# Patient Record
Sex: Female | Born: 1950 | Race: White | Hispanic: No | Smoking: Former smoker
Health system: Southern US, Community
[De-identification: ages and names within clinical notes are randomized; demographics above are authoritative.]

## PROBLEM LIST (undated history)

## (undated) DIAGNOSIS — G4710 Hypersomnia, unspecified: Principal | ICD-10-CM

## (undated) DIAGNOSIS — E119 Type 2 diabetes mellitus without complications: Secondary | ICD-10-CM

## (undated) DIAGNOSIS — K219 Gastro-esophageal reflux disease without esophagitis: Secondary | ICD-10-CM

## (undated) DIAGNOSIS — F419 Anxiety disorder, unspecified: Secondary | ICD-10-CM

## (undated) DIAGNOSIS — G2581 Restless legs syndrome: Secondary | ICD-10-CM

## (undated) DIAGNOSIS — E039 Hypothyroidism, unspecified: Secondary | ICD-10-CM

## (undated) DIAGNOSIS — I5032 Chronic diastolic (congestive) heart failure: Secondary | ICD-10-CM

## (undated) DIAGNOSIS — A0472 Enterocolitis due to Clostridium difficile, not specified as recurrent: Secondary | ICD-10-CM

## (undated) DIAGNOSIS — S62329A Displaced fracture of shaft of unspecified metacarpal bone, initial encounter for closed fracture: Secondary | ICD-10-CM

## (undated) DIAGNOSIS — I2510 Atherosclerotic heart disease of native coronary artery without angina pectoris: Secondary | ICD-10-CM

## (undated) DIAGNOSIS — R079 Chest pain, unspecified: Secondary | ICD-10-CM

## (undated) DIAGNOSIS — I10 Essential (primary) hypertension: Secondary | ICD-10-CM

## (undated) DIAGNOSIS — J111 Influenza due to unidentified influenza virus with other respiratory manifestations: Secondary | ICD-10-CM

## (undated) DIAGNOSIS — G4733 Obstructive sleep apnea (adult) (pediatric): Secondary | ICD-10-CM

## (undated) DIAGNOSIS — M1712 Unilateral primary osteoarthritis, left knee: Secondary | ICD-10-CM

## (undated) DIAGNOSIS — T8859XA Other complications of anesthesia, initial encounter: Secondary | ICD-10-CM

## (undated) DIAGNOSIS — K760 Fatty (change of) liver, not elsewhere classified: Secondary | ICD-10-CM

## (undated) DIAGNOSIS — E538 Deficiency of other specified B group vitamins: Secondary | ICD-10-CM

## (undated) DIAGNOSIS — D179 Benign lipomatous neoplasm, unspecified: Secondary | ICD-10-CM

## (undated) DIAGNOSIS — L659 Nonscarring hair loss, unspecified: Secondary | ICD-10-CM

## (undated) DIAGNOSIS — E785 Hyperlipidemia, unspecified: Secondary | ICD-10-CM

## (undated) DIAGNOSIS — E1151 Type 2 diabetes mellitus with diabetic peripheral angiopathy without gangrene: Secondary | ICD-10-CM

## (undated) DIAGNOSIS — S82409A Unspecified fracture of shaft of unspecified fibula, initial encounter for closed fracture: Secondary | ICD-10-CM

## (undated) DIAGNOSIS — K529 Noninfective gastroenteritis and colitis, unspecified: Secondary | ICD-10-CM

## (undated) DIAGNOSIS — M549 Dorsalgia, unspecified: Secondary | ICD-10-CM

## (undated) DIAGNOSIS — F32A Depression, unspecified: Secondary | ICD-10-CM

## (undated) DIAGNOSIS — L409 Psoriasis, unspecified: Secondary | ICD-10-CM

## (undated) DIAGNOSIS — K52839 Microscopic colitis, unspecified: Secondary | ICD-10-CM

## (undated) DIAGNOSIS — M1990 Unspecified osteoarthritis, unspecified site: Secondary | ICD-10-CM

## (undated) DIAGNOSIS — R0602 Shortness of breath: Secondary | ICD-10-CM

## (undated) DIAGNOSIS — K589 Irritable bowel syndrome without diarrhea: Secondary | ICD-10-CM

## (undated) DIAGNOSIS — H469 Unspecified optic neuritis: Secondary | ICD-10-CM

## (undated) DIAGNOSIS — M7989 Other specified soft tissue disorders: Secondary | ICD-10-CM

## (undated) DIAGNOSIS — K621 Rectal polyp: Secondary | ICD-10-CM

## (undated) DIAGNOSIS — M2550 Pain in unspecified joint: Secondary | ICD-10-CM

## (undated) DIAGNOSIS — F329 Major depressive disorder, single episode, unspecified: Secondary | ICD-10-CM

## (undated) DIAGNOSIS — T4145XA Adverse effect of unspecified anesthetic, initial encounter: Secondary | ICD-10-CM

## (undated) DIAGNOSIS — G4730 Sleep apnea, unspecified: Principal | ICD-10-CM

## (undated) HISTORY — DX: Type 2 diabetes mellitus with diabetic peripheral angiopathy without gangrene: E11.51

## (undated) HISTORY — DX: Sleep apnea, unspecified: G47.30

## (undated) HISTORY — DX: Depression, unspecified: F32.A

## (undated) HISTORY — DX: Atherosclerotic heart disease of native coronary artery without angina pectoris: I25.10

## (undated) HISTORY — DX: Major depressive disorder, single episode, unspecified: F32.9

## (undated) HISTORY — DX: Nonscarring hair loss, unspecified: L65.9

## (undated) HISTORY — DX: Chronic diastolic (congestive) heart failure: I50.32

## (undated) HISTORY — DX: Unspecified optic neuritis: H46.9

## (undated) HISTORY — DX: Hypothyroidism, unspecified: E03.9

## (undated) HISTORY — DX: Microscopic colitis, unspecified: K52.839

## (undated) HISTORY — DX: Other specified soft tissue disorders: M79.89

## (undated) HISTORY — DX: Obstructive sleep apnea (adult) (pediatric): G47.33

## (undated) HISTORY — DX: Hyperlipidemia, unspecified: E78.5

## (undated) HISTORY — DX: Psoriasis, unspecified: L40.9

## (undated) HISTORY — DX: Pain in unspecified joint: M25.50

## (undated) HISTORY — DX: Benign lipomatous neoplasm, unspecified: D17.9

## (undated) HISTORY — DX: Rectal polyp: K62.1

## (undated) HISTORY — DX: Hypersomnia, unspecified: G47.10

## (undated) HISTORY — DX: Gastro-esophageal reflux disease without esophagitis: K21.9

## (undated) HISTORY — DX: Fatty (change of) liver, not elsewhere classified: K76.0

## (undated) HISTORY — DX: Type 2 diabetes mellitus without complications: E11.9

## (undated) HISTORY — DX: Enterocolitis due to Clostridium difficile, not specified as recurrent: A04.72

## (undated) HISTORY — DX: Displaced fracture of shaft of unspecified metacarpal bone, initial encounter for closed fracture: S62.329A

## (undated) HISTORY — DX: Restless legs syndrome: G25.81

## (undated) HISTORY — DX: Irritable bowel syndrome, unspecified: K58.9

## (undated) HISTORY — DX: Deficiency of other specified B group vitamins: E53.8

## (undated) HISTORY — DX: Shortness of breath: R06.02

## (undated) HISTORY — DX: Unilateral primary osteoarthritis, left knee: M17.12

## (undated) HISTORY — DX: Morbid (severe) obesity due to excess calories: E66.01

## (undated) HISTORY — DX: Chest pain, unspecified: R07.9

## (undated) HISTORY — DX: Noninfective gastroenteritis and colitis, unspecified: K52.9

## (undated) HISTORY — DX: Unspecified osteoarthritis, unspecified site: M19.90

## (undated) HISTORY — DX: Dorsalgia, unspecified: M54.9

## (undated) HISTORY — DX: Essential (primary) hypertension: I10

## (undated) HISTORY — DX: Influenza due to unidentified influenza virus with other respiratory manifestations: J11.1

## (undated) HISTORY — DX: Anxiety disorder, unspecified: F41.9

## (undated) HISTORY — DX: Unspecified fracture of shaft of unspecified fibula, initial encounter for closed fracture: S82.409A

---

## 1997-12-07 ENCOUNTER — Encounter: Payer: Self-pay | Admitting: Family Medicine

## 1997-12-07 LAB — CONVERTED CEMR LAB: Hgb A1c MFr Bld: 6.9 %

## 1998-05-15 ENCOUNTER — Encounter: Payer: Self-pay | Admitting: Family Medicine

## 1998-12-14 ENCOUNTER — Encounter: Payer: Self-pay | Admitting: Family Medicine

## 1998-12-14 LAB — CONVERTED CEMR LAB: Hgb A1c MFr Bld: 7.4 %

## 1999-01-14 ENCOUNTER — Other Ambulatory Visit: Admission: RE | Admit: 1999-01-14 | Discharge: 1999-01-14 | Payer: Self-pay | Admitting: Family Medicine

## 1999-05-12 ENCOUNTER — Encounter: Admission: RE | Admit: 1999-05-12 | Discharge: 1999-08-10 | Payer: Self-pay | Admitting: Family Medicine

## 1999-05-16 HISTORY — PX: OSTEOTOMY AND ULNAR SHORTENING: SHX2140

## 1999-05-24 ENCOUNTER — Encounter: Payer: Self-pay | Admitting: Family Medicine

## 1999-05-24 ENCOUNTER — Encounter: Admission: RE | Admit: 1999-05-24 | Discharge: 1999-05-24 | Payer: Self-pay | Admitting: Family Medicine

## 1999-08-14 ENCOUNTER — Encounter: Payer: Self-pay | Admitting: Family Medicine

## 1999-08-14 LAB — CONVERTED CEMR LAB: Microalbumin U total vol: 11.5 mg/L

## 1999-10-15 ENCOUNTER — Encounter: Payer: Self-pay | Admitting: *Deleted

## 1999-10-15 ENCOUNTER — Emergency Department (HOSPITAL_COMMUNITY): Admission: EM | Admit: 1999-10-15 | Discharge: 1999-10-15 | Payer: Self-pay | Admitting: *Deleted

## 2000-02-13 ENCOUNTER — Encounter: Payer: Self-pay | Admitting: Family Medicine

## 2000-02-13 LAB — CONVERTED CEMR LAB: Hgb A1c MFr Bld: 5.7 %

## 2000-08-13 ENCOUNTER — Encounter: Payer: Self-pay | Admitting: Family Medicine

## 2000-08-13 LAB — CONVERTED CEMR LAB: Hgb A1c MFr Bld: 5.8 %

## 2000-09-18 ENCOUNTER — Encounter: Admission: RE | Admit: 2000-09-18 | Discharge: 2000-09-18 | Payer: Self-pay | Admitting: Family Medicine

## 2000-09-18 ENCOUNTER — Encounter: Payer: Self-pay | Admitting: Family Medicine

## 2001-05-15 ENCOUNTER — Encounter: Payer: Self-pay | Admitting: Family Medicine

## 2001-05-15 LAB — CONVERTED CEMR LAB: Hgb A1c MFr Bld: 5.9 %

## 2002-01-13 ENCOUNTER — Encounter: Payer: Self-pay | Admitting: Family Medicine

## 2002-01-23 ENCOUNTER — Other Ambulatory Visit: Admission: RE | Admit: 2002-01-23 | Discharge: 2002-01-23 | Payer: Self-pay | Admitting: Family Medicine

## 2002-02-24 ENCOUNTER — Encounter: Payer: Self-pay | Admitting: Family Medicine

## 2002-02-24 ENCOUNTER — Encounter: Admission: RE | Admit: 2002-02-24 | Discharge: 2002-02-24 | Payer: Self-pay | Admitting: Family Medicine

## 2002-06-15 ENCOUNTER — Encounter: Payer: Self-pay | Admitting: Family Medicine

## 2002-06-15 LAB — CONVERTED CEMR LAB: Hgb A1c MFr Bld: 6.1 %

## 2003-05-16 ENCOUNTER — Encounter: Payer: Self-pay | Admitting: Family Medicine

## 2003-06-23 ENCOUNTER — Ambulatory Visit (HOSPITAL_COMMUNITY): Admission: RE | Admit: 2003-06-23 | Discharge: 2003-06-23 | Payer: Self-pay | Admitting: Family Medicine

## 2003-08-14 ENCOUNTER — Encounter: Payer: Self-pay | Admitting: Family Medicine

## 2003-12-04 ENCOUNTER — Other Ambulatory Visit: Admission: RE | Admit: 2003-12-04 | Discharge: 2003-12-04 | Payer: Self-pay | Admitting: Family Medicine

## 2004-05-10 ENCOUNTER — Ambulatory Visit: Payer: Self-pay | Admitting: Family Medicine

## 2004-05-15 DIAGNOSIS — K760 Fatty (change of) liver, not elsewhere classified: Secondary | ICD-10-CM | POA: Insufficient documentation

## 2004-05-15 HISTORY — DX: Fatty (change of) liver, not elsewhere classified: K76.0

## 2004-05-15 HISTORY — PX: ESOPHAGOGASTRODUODENOSCOPY: SHX1529

## 2004-07-05 ENCOUNTER — Ambulatory Visit: Payer: Self-pay | Admitting: Family Medicine

## 2004-08-10 ENCOUNTER — Ambulatory Visit: Payer: Self-pay | Admitting: Internal Medicine

## 2004-08-11 ENCOUNTER — Ambulatory Visit: Payer: Self-pay | Admitting: Internal Medicine

## 2004-11-01 ENCOUNTER — Ambulatory Visit: Payer: Self-pay | Admitting: Family Medicine

## 2004-11-22 ENCOUNTER — Other Ambulatory Visit: Admission: RE | Admit: 2004-11-22 | Discharge: 2004-11-22 | Payer: Self-pay | Admitting: Obstetrics and Gynecology

## 2004-12-26 ENCOUNTER — Ambulatory Visit: Payer: Self-pay | Admitting: Family Medicine

## 2004-12-29 ENCOUNTER — Ambulatory Visit: Payer: Self-pay | Admitting: Family Medicine

## 2005-01-20 ENCOUNTER — Ambulatory Visit: Payer: Self-pay | Admitting: Cardiology

## 2005-02-09 ENCOUNTER — Ambulatory Visit: Payer: Self-pay

## 2005-05-10 ENCOUNTER — Ambulatory Visit: Payer: Self-pay | Admitting: Family Medicine

## 2005-09-15 ENCOUNTER — Ambulatory Visit: Payer: Self-pay | Admitting: Family Medicine

## 2005-11-21 ENCOUNTER — Ambulatory Visit: Payer: Self-pay | Admitting: Family Medicine

## 2006-02-02 ENCOUNTER — Ambulatory Visit: Payer: Self-pay | Admitting: Family Medicine

## 2006-02-02 LAB — CONVERTED CEMR LAB
Hgb A1c MFr Bld: 6.3 %
Microalbumin U total vol: 1.9 mg/L

## 2006-05-16 ENCOUNTER — Encounter: Payer: Self-pay | Admitting: Family Medicine

## 2006-05-16 LAB — CONVERTED CEMR LAB

## 2006-05-17 ENCOUNTER — Ambulatory Visit: Payer: Self-pay | Admitting: Family Medicine

## 2006-11-12 ENCOUNTER — Encounter: Payer: Self-pay | Admitting: Family Medicine

## 2006-11-12 DIAGNOSIS — E039 Hypothyroidism, unspecified: Secondary | ICD-10-CM | POA: Insufficient documentation

## 2006-11-12 DIAGNOSIS — E538 Deficiency of other specified B group vitamins: Secondary | ICD-10-CM | POA: Insufficient documentation

## 2006-11-12 DIAGNOSIS — R609 Edema, unspecified: Secondary | ICD-10-CM | POA: Insufficient documentation

## 2006-11-12 DIAGNOSIS — F4321 Adjustment disorder with depressed mood: Secondary | ICD-10-CM | POA: Insufficient documentation

## 2006-11-14 ENCOUNTER — Encounter (INDEPENDENT_AMBULATORY_CARE_PROVIDER_SITE_OTHER): Payer: Self-pay | Admitting: *Deleted

## 2006-11-19 ENCOUNTER — Encounter (INDEPENDENT_AMBULATORY_CARE_PROVIDER_SITE_OTHER): Payer: Self-pay | Admitting: *Deleted

## 2006-11-22 ENCOUNTER — Encounter: Admission: RE | Admit: 2006-11-22 | Discharge: 2006-11-22 | Payer: Self-pay | Admitting: Obstetrics and Gynecology

## 2006-12-11 ENCOUNTER — Ambulatory Visit: Payer: Self-pay | Admitting: Family Medicine

## 2006-12-11 LAB — CONVERTED CEMR LAB: Hgb A1c MFr Bld: 6.3 % — ABNORMAL HIGH (ref 4.6–6.0)

## 2006-12-13 ENCOUNTER — Ambulatory Visit: Payer: Self-pay | Admitting: Family Medicine

## 2007-01-17 ENCOUNTER — Encounter: Payer: Self-pay | Admitting: Family Medicine

## 2007-01-25 ENCOUNTER — Ambulatory Visit (HOSPITAL_COMMUNITY): Admission: RE | Admit: 2007-01-25 | Discharge: 2007-01-25 | Payer: Self-pay | Admitting: Surgery

## 2007-02-11 ENCOUNTER — Encounter: Admission: RE | Admit: 2007-02-11 | Discharge: 2007-02-11 | Payer: Self-pay | Admitting: Surgery

## 2007-03-14 ENCOUNTER — Telehealth: Payer: Self-pay | Admitting: Family Medicine

## 2007-03-19 ENCOUNTER — Encounter (INDEPENDENT_AMBULATORY_CARE_PROVIDER_SITE_OTHER): Payer: Self-pay | Admitting: *Deleted

## 2007-03-22 ENCOUNTER — Encounter (INDEPENDENT_AMBULATORY_CARE_PROVIDER_SITE_OTHER): Payer: Self-pay | Admitting: *Deleted

## 2007-07-13 ENCOUNTER — Encounter: Payer: Self-pay | Admitting: Family Medicine

## 2007-07-13 ENCOUNTER — Emergency Department (HOSPITAL_COMMUNITY): Admission: EM | Admit: 2007-07-13 | Discharge: 2007-07-13 | Payer: Self-pay | Admitting: Emergency Medicine

## 2007-07-17 ENCOUNTER — Encounter: Payer: Self-pay | Admitting: Family Medicine

## 2007-07-18 ENCOUNTER — Ambulatory Visit: Payer: Self-pay | Admitting: Family Medicine

## 2007-07-18 DIAGNOSIS — E876 Hypokalemia: Secondary | ICD-10-CM | POA: Insufficient documentation

## 2007-07-19 LAB — CONVERTED CEMR LAB
CO2: 29 meq/L (ref 19–32)
Calcium: 9 mg/dL (ref 8.4–10.5)
Chloride: 97 meq/L (ref 96–112)
GFR calc non Af Amer: 50 mL/min
Glucose, Bld: 100 mg/dL — ABNORMAL HIGH (ref 70–99)
Sodium: 134 meq/L — ABNORMAL LOW (ref 135–145)

## 2007-08-08 ENCOUNTER — Ambulatory Visit: Payer: Self-pay | Admitting: Cardiology

## 2007-08-20 ENCOUNTER — Encounter: Payer: Self-pay | Admitting: Family Medicine

## 2007-08-20 ENCOUNTER — Ambulatory Visit: Payer: Self-pay

## 2007-08-21 ENCOUNTER — Ambulatory Visit: Payer: Self-pay

## 2007-10-15 ENCOUNTER — Telehealth: Payer: Self-pay | Admitting: Family Medicine

## 2007-12-18 ENCOUNTER — Telehealth: Payer: Self-pay | Admitting: Family Medicine

## 2007-12-31 ENCOUNTER — Ambulatory Visit: Payer: Self-pay | Admitting: Family Medicine

## 2007-12-31 DIAGNOSIS — S335XXA Sprain of ligaments of lumbar spine, initial encounter: Secondary | ICD-10-CM | POA: Insufficient documentation

## 2008-02-10 ENCOUNTER — Ambulatory Visit: Payer: Self-pay | Admitting: Family Medicine

## 2008-02-10 LAB — CONVERTED CEMR LAB
BUN: 15 mg/dL (ref 6–23)
CO2: 29 meq/L (ref 19–32)
Calcium: 8.4 mg/dL (ref 8.4–10.5)
Chloride: 107 meq/L (ref 96–112)
Creatinine, Ser: 0.8 mg/dL (ref 0.4–1.2)
Hgb A1c MFr Bld: 6.1 % — ABNORMAL HIGH (ref 4.6–6.0)

## 2008-03-04 ENCOUNTER — Ambulatory Visit: Payer: Self-pay | Admitting: Family Medicine

## 2008-05-11 ENCOUNTER — Ambulatory Visit: Payer: Self-pay | Admitting: Family Medicine

## 2008-05-27 LAB — CONVERTED CEMR LAB: Pap Smear: NORMAL

## 2008-06-29 ENCOUNTER — Telehealth: Payer: Self-pay | Admitting: Family Medicine

## 2008-11-02 ENCOUNTER — Encounter: Payer: Self-pay | Admitting: Family Medicine

## 2008-11-02 ENCOUNTER — Ambulatory Visit (HOSPITAL_BASED_OUTPATIENT_CLINIC_OR_DEPARTMENT_OTHER): Admission: RE | Admit: 2008-11-02 | Discharge: 2008-11-02 | Payer: Self-pay | Admitting: Orthopedic Surgery

## 2008-11-02 HISTORY — PX: KNEE SURGERY: SHX244

## 2008-12-02 ENCOUNTER — Ambulatory Visit: Payer: Self-pay | Admitting: Family Medicine

## 2008-12-03 ENCOUNTER — Telehealth: Payer: Self-pay | Admitting: Family Medicine

## 2009-01-07 ENCOUNTER — Ambulatory Visit: Payer: Self-pay | Admitting: Family Medicine

## 2009-01-07 LAB — CONVERTED CEMR LAB
CO2: 29 meq/L (ref 19–32)
Calcium: 8.4 mg/dL (ref 8.4–10.5)
Chloride: 101 meq/L (ref 96–112)
Creatinine, Ser: 1 mg/dL (ref 0.4–1.2)
GFR calc non Af Amer: 60.81 mL/min (ref >60)
Glucose, Bld: 123 mg/dL — ABNORMAL HIGH (ref 70–99)
Hgb A1c MFr Bld: 5.6 % (ref 4.6–6.5)
Phosphorus: 3.6 mg/dL (ref 2.3–4.6)
Potassium: 3.3 meq/L — ABNORMAL LOW (ref 3.5–5.1)

## 2009-01-13 ENCOUNTER — Ambulatory Visit: Payer: Self-pay | Admitting: Family Medicine

## 2009-01-13 DIAGNOSIS — H60549 Acute eczematoid otitis externa, unspecified ear: Secondary | ICD-10-CM | POA: Insufficient documentation

## 2009-02-02 ENCOUNTER — Telehealth: Payer: Self-pay | Admitting: Family Medicine

## 2009-02-11 ENCOUNTER — Telehealth: Payer: Self-pay | Admitting: Family Medicine

## 2009-03-01 ENCOUNTER — Telehealth: Payer: Self-pay | Admitting: Family Medicine

## 2009-03-12 ENCOUNTER — Telehealth (INDEPENDENT_AMBULATORY_CARE_PROVIDER_SITE_OTHER): Payer: Self-pay | Admitting: Internal Medicine

## 2009-04-06 ENCOUNTER — Ambulatory Visit: Payer: Self-pay | Admitting: Family Medicine

## 2009-04-06 LAB — CONVERTED CEMR LAB
Basophils Absolute: 0.1 10*3/uL (ref 0.0–0.1)
Bilirubin, Direct: 0 mg/dL (ref 0.0–0.3)
Calcium: 9.2 mg/dL (ref 8.4–10.5)
Creatinine, Ser: 0.7 mg/dL (ref 0.4–1.2)
Eosinophils Absolute: 0.2 10*3/uL (ref 0.0–0.7)
HCT: 41.7 % (ref 36.0–46.0)
HDL: 51.1 mg/dL (ref >39.00)
LDL Cholesterol: 81 mg/dL (ref 0–99)
Lymphs Abs: 1.2 10*3/uL (ref 0.7–4.0)
Microalb, Ur: 0.4 mg/dL (ref 0.0–1.9)
Monocytes Relative: 8.3 % (ref 3.0–12.0)
Platelets: 253 10*3/uL (ref 150.0–400.0)
RDW: 13.9 % (ref 11.5–14.6)
TSH: 2.46 microintl units/mL (ref 0.35–5.50)
Total Bilirubin: 1.1 mg/dL (ref 0.3–1.2)
Total CHOL/HDL Ratio: 3
Total Protein: 7.5 g/dL (ref 6.0–8.3)
Triglycerides: 123 mg/dL (ref 0.0–149.0)

## 2009-04-07 LAB — CONVERTED CEMR LAB: Vit D, 25-Hydroxy: 9 ng/mL — ABNORMAL LOW (ref 30–89)

## 2009-04-13 ENCOUNTER — Ambulatory Visit: Payer: Self-pay | Admitting: Family Medicine

## 2009-04-20 ENCOUNTER — Ambulatory Visit: Payer: Self-pay | Admitting: Family Medicine

## 2009-04-20 DIAGNOSIS — L03119 Cellulitis of unspecified part of limb: Secondary | ICD-10-CM

## 2009-04-20 DIAGNOSIS — L02419 Cutaneous abscess of limb, unspecified: Secondary | ICD-10-CM | POA: Insufficient documentation

## 2009-04-30 ENCOUNTER — Telehealth: Payer: Self-pay | Admitting: Family Medicine

## 2009-05-04 ENCOUNTER — Telehealth: Payer: Self-pay | Admitting: Family Medicine

## 2009-05-21 DIAGNOSIS — E1165 Type 2 diabetes mellitus with hyperglycemia: Secondary | ICD-10-CM

## 2009-05-21 DIAGNOSIS — Z6841 Body Mass Index (BMI) 40.0 and over, adult: Secondary | ICD-10-CM

## 2009-05-21 DIAGNOSIS — IMO0002 Reserved for concepts with insufficient information to code with codable children: Secondary | ICD-10-CM | POA: Insufficient documentation

## 2009-05-21 DIAGNOSIS — E1140 Type 2 diabetes mellitus with diabetic neuropathy, unspecified: Secondary | ICD-10-CM | POA: Insufficient documentation

## 2009-05-26 LAB — HM PAP SMEAR

## 2009-06-03 LAB — HM MAMMOGRAPHY: HM Mammogram: NORMAL

## 2009-06-07 ENCOUNTER — Ambulatory Visit: Payer: Self-pay | Admitting: Family Medicine

## 2009-06-07 LAB — CONVERTED CEMR LAB
ALT: 15 units/L (ref 0–35)
AST: 16 units/L (ref 0–37)

## 2009-06-10 ENCOUNTER — Telehealth: Payer: Self-pay | Admitting: Family Medicine

## 2009-06-17 ENCOUNTER — Ambulatory Visit: Payer: Self-pay | Admitting: Cardiology

## 2009-06-17 DIAGNOSIS — E785 Hyperlipidemia, unspecified: Secondary | ICD-10-CM | POA: Insufficient documentation

## 2009-07-08 ENCOUNTER — Telehealth: Payer: Self-pay | Admitting: Family Medicine

## 2009-07-09 ENCOUNTER — Encounter (INDEPENDENT_AMBULATORY_CARE_PROVIDER_SITE_OTHER): Payer: Self-pay | Admitting: *Deleted

## 2009-07-15 ENCOUNTER — Ambulatory Visit: Payer: Self-pay | Admitting: Family Medicine

## 2009-07-15 DIAGNOSIS — E559 Vitamin D deficiency, unspecified: Secondary | ICD-10-CM | POA: Insufficient documentation

## 2009-07-15 LAB — CONVERTED CEMR LAB
AST: 20 units/L (ref 0–37)
Cholesterol: 148 mg/dL (ref 0–200)
VLDL: 25 mg/dL (ref 0.0–40.0)

## 2009-07-16 LAB — CONVERTED CEMR LAB: Vit D, 25-Hydroxy: 15 ng/mL — ABNORMAL LOW (ref 30–89)

## 2009-07-21 ENCOUNTER — Ambulatory Visit: Payer: Self-pay | Admitting: Family Medicine

## 2009-08-26 ENCOUNTER — Telehealth: Payer: Self-pay | Admitting: Family Medicine

## 2009-09-24 ENCOUNTER — Telehealth: Payer: Self-pay | Admitting: Cardiology

## 2009-10-08 ENCOUNTER — Encounter: Payer: Self-pay | Admitting: Cardiology

## 2009-10-08 ENCOUNTER — Telehealth (INDEPENDENT_AMBULATORY_CARE_PROVIDER_SITE_OTHER): Payer: Self-pay | Admitting: *Deleted

## 2009-10-08 ENCOUNTER — Ambulatory Visit: Payer: Self-pay | Admitting: Cardiology

## 2009-10-08 ENCOUNTER — Observation Stay (HOSPITAL_COMMUNITY): Admission: EM | Admit: 2009-10-08 | Discharge: 2009-10-13 | Payer: Self-pay | Admitting: Emergency Medicine

## 2009-10-12 HISTORY — PX: CARDIAC CATHETERIZATION: SHX172

## 2009-10-13 ENCOUNTER — Encounter: Payer: Self-pay | Admitting: Cardiology

## 2009-10-19 ENCOUNTER — Encounter (INDEPENDENT_AMBULATORY_CARE_PROVIDER_SITE_OTHER): Payer: Self-pay | Admitting: *Deleted

## 2009-10-19 ENCOUNTER — Ambulatory Visit: Payer: Self-pay | Admitting: Family Medicine

## 2009-10-19 DIAGNOSIS — R079 Chest pain, unspecified: Secondary | ICD-10-CM | POA: Insufficient documentation

## 2009-10-20 LAB — CONVERTED CEMR LAB: Vit D, 25-Hydroxy: 24 ng/mL — ABNORMAL LOW (ref 30–89)

## 2009-11-08 ENCOUNTER — Ambulatory Visit: Payer: Self-pay | Admitting: Pulmonary Disease

## 2009-11-08 DIAGNOSIS — Z9989 Dependence on other enabling machines and devices: Secondary | ICD-10-CM | POA: Insufficient documentation

## 2009-11-08 DIAGNOSIS — G4733 Obstructive sleep apnea (adult) (pediatric): Secondary | ICD-10-CM | POA: Insufficient documentation

## 2009-11-17 ENCOUNTER — Ambulatory Visit: Payer: Self-pay | Admitting: Cardiology

## 2009-12-15 ENCOUNTER — Telehealth: Payer: Self-pay | Admitting: Family Medicine

## 2009-12-21 ENCOUNTER — Encounter (INDEPENDENT_AMBULATORY_CARE_PROVIDER_SITE_OTHER): Payer: Self-pay | Admitting: *Deleted

## 2009-12-23 ENCOUNTER — Ambulatory Visit: Payer: Self-pay | Admitting: Family Medicine

## 2009-12-23 LAB — CONVERTED CEMR LAB: Pro B Natriuretic peptide (BNP): 69.8 pg/mL (ref 0.0–100.0)

## 2009-12-24 LAB — CONVERTED CEMR LAB
AST: 18 units/L (ref 0–37)
Albumin: 3.7 g/dL (ref 3.5–5.2)
Alkaline Phosphatase: 64 units/L (ref 39–117)
Basophils Relative: 0.6 % (ref 0.0–3.0)
Bilirubin, Direct: 0.1 mg/dL (ref 0.0–0.3)
Calcium: 9.2 mg/dL (ref 8.4–10.5)
Creatinine, Ser: 0.7 mg/dL (ref 0.4–1.2)
GFR calc non Af Amer: 91.46 mL/min (ref >60)
Hemoglobin: 14.1 g/dL (ref 12.0–15.0)
Lymphocytes Relative: 26.6 % (ref 12.0–46.0)
Monocytes Relative: 4.7 % (ref 3.0–12.0)
Neutro Abs: 4.4 10*3/uL (ref 1.4–7.7)
Neutrophils Relative %: 66 % (ref 43.0–77.0)
RBC: 4.77 M/uL (ref 3.87–5.11)
Sodium: 141 meq/L (ref 135–145)
Total Protein: 6.8 g/dL (ref 6.0–8.3)
WBC: 6.6 10*3/uL (ref 4.5–10.5)

## 2010-01-13 ENCOUNTER — Ambulatory Visit: Payer: Self-pay | Admitting: Internal Medicine

## 2010-01-13 DIAGNOSIS — R197 Diarrhea, unspecified: Secondary | ICD-10-CM | POA: Insufficient documentation

## 2010-01-13 DIAGNOSIS — R1013 Epigastric pain: Secondary | ICD-10-CM | POA: Insufficient documentation

## 2010-01-13 DIAGNOSIS — R112 Nausea with vomiting, unspecified: Secondary | ICD-10-CM | POA: Insufficient documentation

## 2010-01-26 ENCOUNTER — Emergency Department (HOSPITAL_COMMUNITY): Admission: EM | Admit: 2010-01-26 | Discharge: 2010-01-26 | Payer: Self-pay | Admitting: Emergency Medicine

## 2010-03-02 ENCOUNTER — Telehealth: Payer: Self-pay | Admitting: Family Medicine

## 2010-03-09 ENCOUNTER — Ambulatory Visit: Payer: Self-pay | Admitting: Family Medicine

## 2010-04-19 ENCOUNTER — Telehealth: Payer: Self-pay | Admitting: Family Medicine

## 2010-05-02 ENCOUNTER — Telehealth: Payer: Self-pay | Admitting: Family Medicine

## 2010-05-11 ENCOUNTER — Telehealth: Payer: Self-pay | Admitting: Family Medicine

## 2010-05-18 ENCOUNTER — Ambulatory Visit
Admission: RE | Admit: 2010-05-18 | Discharge: 2010-05-18 | Payer: Self-pay | Source: Home / Self Care | Attending: Family Medicine | Admitting: Family Medicine

## 2010-06-05 ENCOUNTER — Encounter: Payer: Self-pay | Admitting: Obstetrics and Gynecology

## 2010-06-05 ENCOUNTER — Encounter: Payer: Self-pay | Admitting: Family Medicine

## 2010-06-05 ENCOUNTER — Encounter: Payer: Self-pay | Admitting: Internal Medicine

## 2010-06-06 ENCOUNTER — Telehealth: Payer: Self-pay | Admitting: Family Medicine

## 2010-06-14 NOTE — Progress Notes (Signed)
Summary: [facility]   Imported By: Sherian Rein 01/18/2010 11:53:23  _____________________________________________________________________  External Attachment:    Type:   Image     Comment:   External Document

## 2010-06-14 NOTE — Assessment & Plan Note (Signed)
Summary: FEET AND LEGS SWELLING/acute only   Vital Signs:  Patient profile:   60 year old [place] Height:      66 inches Weight:      345.38 pounds BMI:     55.95 Temp:     98.4 degrees F oral Pulse rate:   72 / minute Pulse rhythm:   regular BP sitting:   120 / 82  (left arm) Cuff size:   large  Vitals Entered By: Linde Gillis CMA Duncan Dull) (December 23, 2009 2:45 PM) CC: swelling in feet and legs   History of Present Illness: 60 yo new to me here for increased swelling in legs and feet x 2 days.  She was recently in the hosp for CP and had Cath.  She has had no recurrent chest pain. Has gained 6 pounds since cardiology visit last month.  Hospital records reviewed.  Cath and 2 D echo showed normal LV function.  Has been trying to exercise, thinks her weight is increased because retaining fluid. No wheezing or SOB.  No increase in salty food. She is premenopausal and always has some LE edema around her period.  Takes Triamterene-HCTZ, no other diuretics. Never had to use compression hose or stronger diuresis.  Current Medications (verified): 1)  Triamterene-Hctz 75-50 Mg Tabs (Triamterene-Hctz) .... Take One By Mouth Daily 2)  Ramipril 10 Mg Caps (Ramipril) .Marland Kitchen.. 1 Cap Once Daily 3)  Lipitor 40 Mg Tabs (Atorvastatin Calcium) .Marland Kitchen.. 1 Once Daily 4)  Glucovance 1.25-250 Mg Tabs (Glyburide-Metformin) .... One in Am By Mouth 5)  Synthroid 200 Mcg Tabs (Levothyroxine Sodium) .... One Tab By Mouth Once Daily 6)  Xanax 0.25 Mg  Tabs (Alprazolam) .... Take One By Mouth Three Times A Day As Needed 7)  Vitamin B-12 Cr 1000 Mcg  Tbcr (Cyanocobalamin) .... Inection Monthly By Daughter 8)  Lexapro 20 Mg  Tabs (Escitalopram Oxalate) .Marland Kitchen.. 1 1/2  Tab By Mouth Daily 9)  Monoject Syringe 25g X 1" 3 Ml Misc (Syringe/needle (Disp)) .... As Dir 10)  Protonix 40 Mg Tbec (Pantoprazole Sodium) .... Take One By Mouth Two Times A Day  Allergies: 1)  ! Erythromycin Base (Erythromycin Base)  Past  History:  Past Medical History: Last updated: 11/24/2009 B12 Deficiency Anxiety Disorder Coronary Artery Disease Depression Diabetes Hyperlipidemia Hypothyroidism Obesity  Past Surgical History: Last updated: 10/17/2009 NSVD x1 Persantine Cardiolite- wnl 97- 02/00 Ulnar osteotomy  ([name]), negative wrist 10/01 Colonoscopy 03/Caitlyn/06 Abd. U/S wnl except fatty liver 03/06 Adenosine Myoview nml 08/20/07 L Knee Partial medial and Lat Menisectomies and Condroplasty (Dr Thurston Hole) 11/02/08 HOSP CP R/O'd Nonobstruct CAD by Cath  5/27-10/13/2009 CATH and ECHO Nonobstr CAD  EF 60% 10/12/2009 HOSP CP R/O'd Cath w/ CAD EF 60% 5/27-10/13/2009  Family History: Last updated: 2009-05-08 Father: Died  at age 39 of a heart attack Mother: Died at age 40 with coronary disease, S/P 14 angioplasties and 5-vessel              coronary artery bypass grafting,  she also had a stroke and diabetes Brother died at the age of 75 of a heart attack, he had his first heart               attack at 89 years of age.  Sister A 62  Social History: Last updated: 11/24/2009 Marital Status: divorced.  lives with daughter who is married Occupation: Nature conservation officer for [employer]  Risk Factors: Alcohol Use: 0 (2009-05-08) Caffeine Use: 4 (2009-05-08) Exercise: no (08-May-2009)  Risk Factors: Smoking Status: quit (11/Caitlyn/2010) Packs/Day: 11/2 (11/Caitlyn/2010)  Review of Systems      See HPI General:  Denies malaise. Eyes:  Denies blurring. CV:  Complains of swelling of feet and weight gain; denies chest pain or discomfort and shortness of breath with exertion. Resp:  Denies shortness of breath, sputum productive, and wheezing. GI:  Denies abdominal pain and change in bowel habits. MS:  Denies joint pain, joint redness, and joint swelling.  Physical Exam  General:  Well-developed,well-nourished,in no acute distress; alert,appropriate and cooperative throughout examination, obese. Mouth:  Oral mucosa  and oropharynx without lesions or exudates.  Teeth in good repair. Lungs:  Normal respiratory effort, chest expands symmetrically. Lungs are clear to auscultation, no crackles or wheezes. Heart:  Normal rate and regular rhythm. S1 and S2 normal without gallop, murmur, click, rub or other extra sounds. Extremities:  1+ pitting pedal edema bilaterally to tibial area. No erythema, tenderness to palpation, ulceration or oozing.  Psych:  normal affect, talkative and pleasant    Impression & Recommendations:  Problem # 1:  LEG EDEMA, BILATERAL (ICD-782.3) Assessment Deteriorated Differential is wide and this is likely multifactorial. Very reassuring that she just had a normal cath and normal echo.  No cardiac complaints, other than LE edema.  EKG unchanged. Likely combination of obesity, weight gain, menstrual irregularities. Will check labs- BMET, CBC, TSH, BNP. If all normal, try either compression hose vs. short course of increased diuresis. Pt in agreement with plan. Her updated medication list for this problem includes:    Triamterene-hctz 75-50 Mg Tabs (Triamterene-hctz) .Marland Kitchen... Take one by mouth daily  Orders: Venipuncture ([address]) TLB-BMP (Basic Metabolic Panel-BMET) (80048-METABOL) TLB-CBC Platelet - w/Differential (85025-CBCD) TLB-Hepatic/Liver Function Pnl (80076-HEPATIC) TLB-TSH (Thyroid Stimulating Hormone) (84443-TSH) TLB-BNP (B-Natriuretic Peptide) (83880-BNPR) EKG w/ Interpretation (93000)  Complete Medication List: 1)  Triamterene-hctz 75-50 Mg Tabs (Triamterene-hctz) .... Take one by mouth daily 2)  Ramipril 10 Mg Caps (Ramipril) .Marland Kitchen.. 1 cap once daily 3)  Lipitor 40 Mg Tabs (Atorvastatin calcium) .Marland Kitchen.. 1 once daily 4)  Glucovance 1.25-250 Mg Tabs (Glyburide-metformin) .... One in am by mouth 5)  Synthroid 200 Mcg Tabs (Levothyroxine sodium) .... One tab by mouth once daily 6)  Xanax 0.25 Mg Tabs (Alprazolam) .... Take one by mouth three times a day as needed 7)  Vitamin  B-12 Cr 1000 Mcg Tbcr (Cyanocobalamin) .... Inection monthly by daughter 92)  Lexapro 20 Mg Tabs (Escitalopram oxalate) .Marland Kitchen.. 1 1/2  tab by mouth daily 9)  Monoject Syringe 25g X 1" 3 Ml Misc (Syringe/needle (disp)) .... As dir 10)  Protonix 40 Mg Tbec (Pantoprazole sodium) .... Take one by mouth two times a day  Current Allergies (reviewed today): ! ERYTHROMYCIN BASE (ERYTHROMYCIN BASE)

## 2010-06-14 NOTE — Letter (Signed)
Summary: ER Notification  Architectural technologist, Main Office  [address]   [place], [place] [address]   Phone: [phone]  Fax: [phone]    Oct 08, 2009 1:07 PM  Tmya Stigall  The above referenced patient has been advised to report directly to the Emergency Room. Please see below for more information:  Dx: chest pain    Private Vehicle  ____x___________ or EMS:  ________________   Orders:  Yes ______ or No  _______   Notify upon arrival:     Trish [phone]       Or _________________   Thank you,    [facility] Staff

## 2010-06-14 NOTE — Assessment & Plan Note (Signed)
Summary: consult for osa   Copy to:  Laurita Quint, [place] Primary Provider/Referring Provider:  Shaune Leeks MD  CC:  Sleep Consult.  History of Present Illness: The pt is a 60y/o female who I have been asked to see for possible osa.  She has been noted to have loud snoring, as well as pauses in her breathing during sleep.  She has not had choking arousal, but has awakened with coughing and a feeling of something obstructing her airway.  She goes to bed btw 11-50mn, and arises at 7am to start her day.  Most am's she feels rested, but then begins to get tired as the day goes on.  She usually begins to have sleep pressure with periods of inactivity after lunch, and notes a decline in her alertness and ability to concentrate.  She can doze easily while watching tv or reading in the evenings.  She denies any issues with driving short distances, but may have some sleep pressure with longer distances.  She states that her weight is up 20 pounds over the last 2 years, and her epworth score today is 11.  Medications Prior to Update: 1)  Triamterene-Hctz 75-50 Mg Tabs (Triamterene-Hctz) .... Take One By Mouth Daily 2)  Ramipril 10 Mg Caps (Ramipril) .Marland Kitchen.. 1 Cap Once Daily 3)  Simvastatin 80 Mg Tabs (Simvastatin) .... One Tab By Mouth At Night 4)  Actos 30 Mg Tabs (Pioglitazone Hcl) .... One Tab By Mouth Once A Day.Marland Kitchen 5)  Glucovance 1.25-250 Mg Tabs (Glyburide-Metformin) .... One in Am By Mouth 6)  Synthroid 200 Mcg Tabs (Levothyroxine Sodium) .... One Tab By Mouth Once Daily 7)  Xanax 0.25 Mg  Tabs (Alprazolam) .... Take One By Mouth Three Times A Day As Needed 8)  Vitamin B-12 Cr 1000 Mcg  Tbcr (Cyanocobalamin) .... Inection Monthly By Daughter 9)  Lexapro 20 Mg  Tabs (Escitalopram Oxalate) .Marland Kitchen.. 11/2  Tab By Mouth Daily 10)  Monoject Syringe 25g X 1" 3 Ml Misc (Syringe/needle (Disp)) .... As Dir 11)  Vitamin D (Ergocalciferol) 50000 Unit Caps (Ergocalciferol) .... One Tab By Mouth  Weekly 12)  Protonix 40 Mg Tbec (Pantoprazole Sodium) .... Take One By Mouth Two Times A Day 13)  Aspirin 81 Mg Tbec (Aspirin) .... Take One Tablet By Mouth Daily  Allergies (verified): 1)  ! Erythromycin Base (Erythromycin Base)  Past History:  Past Medical History: Reviewed history from 05/21/2009 and no changes required. HYPERLIPIDEMIA, FAMILIAL (ICD-272.0) CORONARY ARTERY DISEASE, FAMILY HX (ICD-V17.3) LEG EDEMA, BILATERAL (ICD-782.3) HYPERCHOLESTEROLEMIA, 255/TRIG. 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 NSVD x1 Persantine Cardiolite- wnl 97- 02/00 Ulnar osteotomy  ([name]), negative wrist 10/01 Colonoscopy 08/11/04 Abd. U/S wnl except fatty liver 03/06 Adenosine Myoview nml 08/20/07 L Knee Partial medial and Lat Menisectomies and Condroplasty (Dr Thurston Hole) 11/02/08 HOSP CP R/O'd Nonobstruct CAD by Cath  5/27-10/13/2009 CATH and ECHO Nonobstr CAD  EF 60% 10/12/2009 HOSP CP R/O'd Cath w/ CAD EF 60% 5/27-10/13/2009  Family History: Reviewed history from 04/13/2009 and no changes required. Father: Died  at age 67 of a heart attack Mother: Died at age 51 with coronary disease, S/P 14 angioplasties and 5-vessel  coronary artery bypass grafting,  she also had a stroke and diabetes Brother died at the age of 74 of a heart attack, he had his first heart               attack at 74 years of age.  Sister A 57  Social History: Marital Status: divorced.  lives with daughter pt has children. Children: Daughter who is married Occupation:  Dentist for [employer]  Review of Systems       The patient complains of shortness of breath with activity, chest pain, weight change, abdominal pain, sore throat, headaches, nasal congestion/difficulty breathing through nose, anxiety, hand/feet swelling, and joint stiffness or pain.  The patient denies shortness of breath at rest, productive cough, non-productive cough, coughing up blood, irregular heartbeats, acid heartburn, indigestion, loss of appetite, difficulty swallowing, tooth/dental problems, sneezing, itching, ear ache, depression, rash, change in color of mucus, and fever.    Vital Signs:  Patient profile:   60 year old female Height:      66 inches Weight:      337 pounds BMI:     54.59 O2 Sat:      94 % on Room air Temp:     98.2 degrees F oral Pulse rate:   84 / minute BP sitting:   108 / 60  (left arm) Cuff size:   large  Vitals Entered By: Arman Filter LPN (November 08, 2009 3:25 PM)  O2 Flow:  Room air CC: Sleep Consult Comments Medications reviewed with patient Arman Filter LPN  November 08, 2009 3:25 PM    Physical Exam  General:  obese female in nad Eyes:  PERRLA and EOMI.   Nose:  deviated septum to right with turbinate hypertrophy and near obstruction.  Left narrowed but patent Mouth:  mild elongation of soft palate, normal uvula Neck:  no jvd, tmg,LN Lungs:  clear to auscultation Heart:  rrr, no mrg Abdomen:  soft and nontender, bs+ Extremities:  mild edema, no cyanosis mild increase in varicosities pulses intact distally Neurologic:  alert and oriented, moves all 4.    Impression & Recommendations:  Problem # 1:  OBSTRUCTIVE SLEEP APNEA (ICD-327.23) the pt's history is very suggestive of osa.  She is morbidly obese, has loud snoring and witnessed pauses during sleep, abnormal upper airway anatomy, and abnormal daytime sleepiness on questioning.  I have had a long discussion with the pt about sleep apnea, including  its impact on QOL and CV health.  I think she needs a sleep study for diagnosis, and will arrange f/u once the results are available.  Other Orders: Consultation Level IV (54098) Sleep Disorder Referral (Sleep Disorder)  Patient Instructions: 1)  will set up for sleep study, and arrange followup once results are available. 2)  work on weight loss.

## 2010-06-14 NOTE — Assessment & Plan Note (Signed)
Summary: rov  Medications Added RAMIPRIL 10 MG CAPS (RAMIPRIL) 1 cap once daily XANAX 0.25 MG  TABS (ALPRAZOLAM) Take one by mouth three times a day as needed        Visit Type:  rov Primary Provider:  Shaune Leeks MD  CC:  edema/feet..no other complaints today.  History of Present Illness: Ms Leas returns today for followup and management of her multiple cardiovascular risk factors.  She is taking much better care of herself and she was 2 years ago. Her blood pressure done under good control, her last fasting blood sugar was 127, her lipids are at goal with a total cholesterol HDL ratio of 3, and hemoglobin A1c was 6.1%.  Because of her risk factors, we did a stress nuclear study 2 years ago. It was negative for ischemia. She has good LV function.  She does not smoke, drink, and does not  Current Medications (verified): 1)  Triamterene-Hctz 75-50 Mg Tabs (Triamterene-Hctz) .... Take One By Mouth Daily 2)  Ramipril 10 Mg Caps (Ramipril) .Marland Kitchen.. 1 Cap Once Daily 3)  Simvastatin 80 Mg Tabs (Simvastatin) .... One Tab By Mouth At Night 4)  Actos 30 Mg Tabs (Pioglitazone Hcl) .... One Tab By Mouth Once A Day.Marland Kitchen 5)  Glucovance 1.25-250 Mg Tabs (Glyburide-Metformin) .... One in Am By Mouth 6)  Synthroid 200 Mcg Tabs (Levothyroxine Sodium) .... One Tab By Mouth Once Daily 7)  Xanax 0.25 Mg  Tabs (Alprazolam) .... Take One By Mouth Three Times A Day As Needed 8)  Vitamin B-12 Cr 1000 Mcg  Tbcr (Cyanocobalamin) .... Inection Monthly By Daughter 9)  Lexapro 20 Mg  Tabs (Escitalopram Oxalate) .Marland Kitchen.. 11/2  Tab By Mouth Daily 10)  Monoject Syringe 25g X 1" 3 Ml Misc (Syringe/needle (Disp)) .... As Dir  Allergies: 1)  ! 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 238  (ICD-272.0) OBESITY, MORBID (ICD-278.01) DIABETES MELLITUS, TYPE II (ICD-250.00) CELLULITIS, LEG, RIGHT (ICD-682.6) HEALTH MAINTENANCE EXAM (ICD-V70.0) DERMATITIS (ICD-692.9) LUMBAR STRAIN (ICD-847.2) HYPOKALEMIA (ICD-276.8) B12 DEFICIENCY (ICD-266.2) MEDIAL MENISCUS TEAR, RIGHT, CHRONIC (DILWORTH) (ICD-836.0) HYPOTHYROIDISM (ICD-244.9) DEPRESSION (ICD-311) ANXIETY WITH PMS (ICD-300.00)    Past Surgical History: Last updated: 12/03/2008 NSVD x1 Persantine Cardiolite- wnl 97- 02/00 Ulnar osteotomy  ([name]), negative wrist 10/01 Colonoscopy 08/11/04 Abd. U/S wnl except fatty liver 03/06 Adenosine Myoview nml 08/20/07 L Knee Partial medial and Lat Menisectomies and Condroplasty (Dr Thurston Hole) 11/02/08  Family History: Last updated: 04-20-09 Father: Died  at age 11 of a heart attack Mother: Died at age 41 with coronary disease, S/P 14 angioplasties and 5-vessel              coronary artery bypass grafting,  she also had a stroke and diabetes Brother died at the age of 21 of a heart attack, he had his first heart               attack at 6 years of age.  Sister A 27  Social History: Last updated: 2009-04-20 Marital Status: Married Children: Daughter who is married Occupation: Production designer, theatre/television/film for [employer]  Risk Factors: Alcohol Use: 0 (April 20, 2009) Caffeine Use: 4 (April 20, 2009) Exercise: no (20-Apr-2009)  Risk Factors: Smoking Status: quit (04-20-09) Packs/Day: 11/2 (Apr 20, 2009)  Review of Systems       negative other than history of present illness  Vital Signs:  Patient profile:   60 year old female  Height:      66 inches Weight:      335 pounds BMI:     54.27 Pulse rate:   68 / minute Pulse rhythm:   regular BP sitting:   150 / 90  (left arm) Cuff size:   large  Vitals Entered By: Danielle Rankin, CMA (June 17, 2009 4:20 PM)  Physical Exam  General:  obese.   Head:  normocephalic and atraumatic Eyes:  PERRLA/EOM intact; conjunctiva and lids  normal. Neck:  Neck supple, no JVD. No masses, thyromegaly or abnormal cervical nodes. Chest Gunnard Dorrance:  no deformities or breast masses noted Lungs:  Clear bilaterally to auscultation and percussion. Heart:  PMI poorly appreciated, normal S1-S2, no murmur Msk:  Back normal, normal gait. Muscle strength and tone normal. Pulses:  pulses normal in all 4 extremities Extremities:  No clubbing or cyanosis. Neurologic:  Alert and oriented x 3. Skin:  Intact without lesions or rashes. Psych:  Normal affect. great sense of humor   Problems:  Medical Problems Added: 1)  Dx of Hyperlipidemia Type I / Iv  (ICD-272.3) 2)  Dx of Hyperlipidemia-mixed  (ICD-272.4) 3)  Dx of Hyperlipidemia-mixed  (ICD-272.4) 4)  Dx of Hyperlipidemia Type I / Iv  (ICD-272.3)  Impression & Recommendations:  Problem # 1:  OBESITY, MORBID (ICD-278.01) Assessment Improved  Problem # 2:  HYPERLIPIDEMIA-MIXED (ICD-272.4) Assessment: Improved  Her updated medication list for this problem includes:    Simvastatin 80 Mg Tabs (Simvastatin) ..... One tab by mouth at night  Her updated medication list for this problem includes:    Simvastatin 80 Mg Tabs (Simvastatin) ..... One tab by mouth at night  Problem # 3:  CORONARY ARTERY DISEASE, FAMILY HX (ICD-V17.3)  Orders: EKG w/ Interpretation (93000)  Problem # 4:  LEG EDEMA, BILATERAL (ICD-782.3) Assessment: Unchanged  Patient Instructions: 1)  Your physician recommends that you schedule a follow-up appointment in: YEAR WITH DR Nyree Applegate 2)  Your physician recommends that you continue on your current medications as directed. Please refer to the Current Medication list given to you today.

## 2010-06-14 NOTE — Assessment & Plan Note (Signed)
Summary: rule out esophagus spasms--ch.    History of Present Illness Visit Type: consult Primary GI MD: Stan Head MD [facility] Primary Provider: Laurita Quint, MD Chief Complaint: Ongoing chest pain, that could be esophageal spasms. Pt states she has several episodes after meals of Nausea, abd cramping, then diarrhea. History of Present Illness:   60 yo ww with chest pain admission in late May 2011. The cardiac work-up was negative. Dr. Riley Kill started Protonix and overall is better. However she is having q 2 -3week spells of sweats, pre-syncope, nausea and diarrhea. She will have 3 stools and it is gone. She has had incontinence of stools twice this year, with episodes that are without the usual preceding symptoms. No precipitants for sure exvcept salads, steak or hamburger patties which will cause diarrhea. [place] food also tears stomach up.  in between spells she is ok.  No lip swelling or hives with these. Has been on ramipril x 2-3 years  this is very similar to what she describedas a new problem in 2006, and at the time she had a negative EGD. She could not tolerate a prep for colonoscopy then and did not follow-up.  Still has an umbilical hernia and also feels pulling in her right side at times.   GI Review of Systems    Reports abdominal pain, chest pain, and  nausea.     Location of  Abdominal pain: generalized.    Denies acid reflux, belching, bloating, dysphagia with liquids, dysphagia with solids, heartburn, loss of appetite, vomiting, vomiting blood, weight loss, and  weight gain.      Reports diarrhea.     Denies anal fissure, black tarry stools, change in bowel habit, constipation, diverticulosis, fecal incontinence, heme positive stool, hemorrhoids, irritable bowel syndrome, jaundice, light color stool, liver problems, rectal bleeding, and  rectal pain.    Current Medications (verified): 1)  Triamterene-Hctz 75-50 Mg Tabs (Triamterene-Hctz) .... Take One By Mouth  Daily 2)  Ramipril 10 Mg Caps (Ramipril) .Marland Kitchen.. 1 Cap Once Daily 3)  Lipitor 40 Mg Tabs (Atorvastatin Calcium) .Marland Kitchen.. 1 Once Daily 4)  Glucovance 1.25-250 Mg Tabs (Glyburide-Metformin) .... One in Am By Mouth 5)  Synthroid 200 Mcg Tabs (Levothyroxine Sodium) .... One Tab By Mouth Once Daily 6)  Xanax 0.25 Mg  Tabs (Alprazolam) .... Take One By Mouth Three Times A Day As Needed 7)  Vitamin B-12 Cr 1000 Mcg  Tbcr (Cyanocobalamin) .... Inection Monthly By Daughter 8)  Lexapro 20 Mg  Tabs (Escitalopram Oxalate) .Marland Kitchen.. 1 1/2  Tab By Mouth Daily 9)  Monoject Syringe 25g X 1" 3 Ml Misc (Syringe/needle (Disp)) .... As Dir 10)  Protonix 40 Mg Tbec (Pantoprazole Sodium) .... Take One By Mouth Once Daily  Allergies (verified): 1)  ! Erythromycin Base (Erythromycin Base)  Past History:  Past Medical History: Reviewed history from 11/24/2009 and no changes required. B12 Deficiency Anxiety Disorder Coronary Artery Disease Depression Diabetes Hyperlipidemia Hypothyroidism Obesity  Past Surgical History: Reviewed history from 10/17/2009 and no changes required. NSVD x1 Persantine Cardiolite- wnl 97- 02/00 Ulnar osteotomy  ([name]), negative wrist 10/01 Colonoscopy 08/11/04 Abd. U/S wnl except fatty liver 03/06 Adenosine Myoview nml 08/20/07 L Knee Partial medial and Lat Menisectomies and Condroplasty (Dr Thurston Hole) 11/02/08 HOSP CP R/O'd Nonobstruct CAD by Cath  5/27-10/13/2009 CATH and ECHO Nonobstr CAD  EF 60% 10/12/2009 HOSP CP R/O'd Cath w/ CAD EF 60% 5/27-10/13/2009  Family History: Reviewed history from 04/13/2009 and no changes required. Father: Died  at age 12 of  a heart attack Mother: Died at age 62 with coronary disease, S/P 14 angioplasties and 5-vessel              coronary artery bypass grafting,  she also had a stroke and diabetes Brother died at the age of 89 of a heart attack, he had his first heart               attack at 34 years of age.  Sister A 38  Social History: Reviewed  history from 11/24/2009 and no changes required. Marital Status: divorced.  lives with daughter who is married Occupation: Nature conservation officer for [employer]  Review of Systems       The patient complains of allergy/sinus, arthritis/joint pain, back pain, and night sweats.  The patient denies anemia, anxiety-new, blood in urine, breast changes/lumps, change in vision, confusion, cough, coughing up blood, depression-new, fainting, fatigue, fever, headaches-new, hearing problems, heart murmur, heart rhythm changes, itching, menstrual pain, muscle pains/cramps, nosebleeds, pregnancy symptoms, shortness of breath, skin rash, sleeping problems, sore throat, swelling of feet/legs, swollen lymph glands, thirst - excessive , urination - excessive , urination changes/pain, urine leakage, vision changes, and voice change.    Vital Signs:  Patient profile:   60 year old female Height:      66 inches Weight:      348 pounds BMI:     56.37 Pulse rate:   70 / minute Pulse rhythm:   regular BP sitting:   128 / 72  (right arm) Cuff size:   large  Vitals Entered By: Christie Nottingham CMA Duncan Dull) (January 13, 2010 1:52 PM)  Physical Exam  General:  obese.  NAD Eyes:  PERRLA, no icterus. Mouth:  No deformity or lesions,  Neck:  Supple; no masses or thyromegaly. Lungs:  Clear throughout to auscultation. Heart:  Regular rate and rhythm; no murmurs, rubs,  or bruits. Abdomen:  soft and nontender, bs+ Obese reducible umbilical hernia  Extremities:  no edema  Neurologic:  Alert and  oriented x3 Cervical Nodes:  No significant cervical or supraclavicular adenopathy.  Psych:  Alert and cooperative. Normal mood and affect.   Impression & Recommendations:  Problem # 1:  EPIGASTRIC PAIN (ICD-789.06) pattern not c/w gallstones and [place] neg also not gastroparesis ? abd migraine ? angioedema on ACE ? food allergy? needs CT abdomen and pelvis when having spell  ? panic disorder with  atypical presentation  ? autonomic disturbance  she described similar problems in 2006 but swears this is different or at least worse  Problem # 2:  NAUSEA AND VOMITING (ICD-787.01) ? abd migraine ? angioedema on ACE ? food allergy? needs CT abdomen and pelvis when having spell  ? panic disorder with atypical presentation  ? autonomic disturbance  she described similar problems in 2006 but swears this is different or at least worse  Problem # 3:  DIARRHEA (ICD-787.91) Assessment: New ? abd migraine ? angioedema on ACE ? food allergy? needs CT abdomen and pelvis when having spell  ? panic disorder with atypical presentation  ? autonomic disturbance  she described similar problems in 2006 but swears this is different or at least worse  Problem # 4:  SCREENING, COLON CANCER (ICD-V76.51) thinking about having colonoscopy "cannot take prep"  Patient Instructions: 1)  Please call us or go to an emergency room to obtain a CT scan of the abdomen and pelvis when you have another attack. 2)  it is very important to obtain a CT  during an attack to see what the stomach and intestine walls look like. 3)  Take these instructions with you if you go to an emergency room. 4)  Copy sent to : Laurita Quint, MD 5)  The medication list was reviewed and reconciled.  All changed / newly prescribed medications were explained.  A complete medication list was provided to the patient / caregiver.

## 2010-06-14 NOTE — Letter (Signed)
Summary: Nadara Eaton letter  [place] at [facility]  [address] [place], [place] [address]   Phone: [phone]  Fax: [phone]       12/21/2009 MRN: [id number]  [name] [address] [place], [place]  [address]  Dear Ms. [facility] and [facility] announce the retirement of Arta Silence, M.D., from full-time practice at the [facility] office effective November 11, 2009 and his plans of returning part-time.  It is important to Dr. Hetty Ely and to our practice that you understand that [facility] has seven physicians in our office for your health care needs.  We will continue to offer the same exceptional care that you have today.    Dr. Hetty Ely has spoken to many of you about his plans for retirement and returning part-time in the fall.   We will continue to work with you through the transition to schedule appointments for you in the office and meet the high standards that [place] is committed to.   Again, it is with great pleasure that we share the news that Dr. Hetty Ely will return to [facility] in October of 2011 with a reduced schedule.    If you have any questions, or would like to request an appointment with one of our physicians, please call us at [phone] and press the option for Scheduling an appointment.  We take pleasure in providing you with excellent patient care and look forward to seeing you at your next office visit.  Our [facility] Physicians are:  Tillman Abide, M.D. Laurita Quint, M.D. Roxy Manns, M.D. Kerby Nora, M.D. Hannah Beat, M.D. [name], M.D. We proudly welcomed Raechel Ache, M.D. and Eustaquio Boyden, M.D. to the practice in July/August 2011.  Sincerely,  [facility]

## 2010-06-14 NOTE — Progress Notes (Signed)
Summary: refill request for xanax  Phone Note Refill Request Message from:  Fax from Pharmacy  Refills Requested: Medication #1:  XANAX 0.25 MG  TABS Take one by mouth three times a day as needed   Last Refilled: 05/04/2009 Faxed request from [facility].  [phone]  Initial call taken by: Lowella Petties CMA,  July 08, 2009 8:52 AM  Follow-up for Phone Call        Called to [facility]. Follow-up by: Lowella Petties CMA,  July 08, 2009 11:28 AM    Prescriptions: [name] 0.25 MG  TABS (ALPRAZOLAM) Take one by mouth three times a day as needed  #180 x 0   Entered and Authorized by:   Shaune Leeks MD   Signed by:   Shaune Leeks MD on 07/08/2009   Method used:   [facility]ph[facility] ...       [facility]  [address] [id number]       [address]       [place], [place]  [address]       Ph: [phone]       Fax: [phone]   RxID:   [id number]

## 2010-06-14 NOTE — Assessment & Plan Note (Signed)
Summary: F/U/TRANSFER FROM DR SCHALLER/CLE   Vital Signs:  Patient profile:   60 year old female Height:      66 inches Weight:      344 pounds BMI:     55.72 Temp:     98.5 degrees F oral Pulse rate:   80 / minute Pulse rhythm:   regular BP sitting:   112 / 70  (left arm) Cuff size:   large  Vitals Entered By: Linde Gillis CMA Duncan Dull) (March 09, 2010 8:07 AM) CC: transfer from Dr. Hetty Ely, follow up   History of Present Illness: 60 yo  here to establish care with me.  Saw her in August for increased swelling in her feet and legs. 2 decho normal, BNP, BMET, liver function and TSH were all normal.  Has been trying to exercise, thinks her weight is increased because retaining fluid. No wheezing or SOB.  No increase in salty food. She is premenopausal and always has some LE edema around her period. Taking lasix for a few days each month and it always alleviates the swelling.    Also on Triamtere- HCTZ 75-50 mg daily. Does not want to try compression hose.    Current Medications (verified): 1)  Triamterene-Hctz 75-50 Mg Tabs (Triamterene-Hctz) .... Take One By Mouth Daily 2)  Ramipril 10 Mg Caps (Ramipril) .Marland Kitchen.. 1 Cap Once Daily 3)  Glucovance 1.25-250 Mg Tabs (Glyburide-Metformin) .... One in Am By Mouth 4)  Synthroid 200 Mcg Tabs (Levothyroxine Sodium) .... One Tab By Mouth Once Daily 5)  Xanax 0.25 Mg  Tabs (Alprazolam) .... Take One By Mouth Three Times A Day As Needed 6)  Vitamin B-12 Cr 1000 Mcg  Tbcr (Cyanocobalamin) .... Inection Monthly By Daughter 7)  Lexapro 20 Mg  Tabs (Escitalopram Oxalate) .Marland Kitchen.. 1 1/2  Tab By Mouth Daily 8)  Monoject Syringe 25g X 1" 3 Ml Misc (Syringe/needle (Disp)) .... As Dir 9)  Furosemide 20 Mg Tabs (Furosemide) .... Take 1 Tab By Mouth Daily As Needed For Swelling. 10)  Actos 30 Mg Tabs (Pioglitazone Hcl) .Marland Kitchen.. 1 By Mouth Daily 11)  Simvastatin 40 Mg Tabs (Simvastatin) .... Take One Tablet At Bedtime 12)  Omeprazole 40 Mg Cpdr  (Omeprazole) .Marland Kitchen.. 1 Tab By Mouth Twice Daily.  Allergies: 1)  ! Erythromycin Base (Erythromycin Base)  Past History:  Past Medical History: Last updated: 11/24/2009 B12 Deficiency Anxiety Disorder Coronary Artery Disease Depression Diabetes Hyperlipidemia Hypothyroidism Obesity  Past Surgical History: Last updated: 10/17/2009 NSVD x1 Persantine Cardiolite- wnl 97- 02/00 Ulnar osteotomy  ([name]), negative wrist 10/01 Colonoscopy 08/11/04 Abd. U/S wnl except fatty liver 03/06 Adenosine Myoview nml 08/20/07 L Knee Partial medial and Lat Menisectomies and Condroplasty (Dr Thurston Hole) 11/02/08 HOSP CP R/O'd Nonobstruct CAD by Cath  5/27-10/13/2009 CATH and ECHO Nonobstr CAD  EF 60% 10/12/2009 HOSP CP R/O'd Cath w/ CAD EF 60% 5/27-10/13/2009  Family History: Last updated: 2009/04/20 Father: Died  at age 32 of a heart attack Mother: Died at age 52 with coronary disease, S/P 14 angioplasties and 5-vessel              coronary artery bypass grafting,  she also had a stroke and diabetes Brother died at the age of 32 of a heart attack, he had his first heart               attack at 90 years of age.  Sister A 31  Social History: Last updated: 11/24/2009 Marital Status: divorced.  lives with daughter who is  married Occupation: Nature conservation officer for [employer]  Risk Factors: Alcohol Use: 0 (04/13/2009) Caffeine Use: 4 (04/13/2009) Exercise: no (04/13/2009)  Risk Factors: Smoking Status: quit (04/13/2009) Packs/Day: 11/2 (04/13/2009)  Review of Systems      See HPI General:  Denies fever. Eyes:  Denies blurring. ENT:  Denies difficulty swallowing. CV:  Denies chest pain or discomfort. Resp:  Denies cough, shortness of breath, and wheezing. GI:  Denies abdominal pain. MS:  Denies joint pain, joint redness, and joint swelling. Derm:  Denies rash. Endo:  Denies cold intolerance and heat intolerance.  Physical Exam  General:  Well-developed,well-nourished,in no  acute distress; alert,appropriate and cooperative throughout examination, obese. Head:  Normocephalic and atraumatic without obvious abnormalities. No apparent alopecia or balding. Eyes:  Conjunctiva clear bilat. Ears:  External ear exam shows no significant lesions or deformities.  Otoscopic examination reveals some wax in ear canals, tympanic membranes are intact bilaterally without bulging, retraction, inflammation or discharge. Hearing is grossly normal bilaterally. Nose:  External nasal examination shows no deformity or inflammation. Nasal mucosa are pink and moist without lesions or exudates. Mouth:  Oral mucosa and oropharynx without lesions or exudates.  Teeth in good repair. Lungs:  Normal respiratory effort, chest expands symmetrically. Lungs are clear to auscultation, no crackles or wheezes. Heart:  Normal rate and regular rhythm. S1 and S2 normal without gallop, murmur, click, rub or other extra sounds. Extremities:  1+ pitting pedal edema bilaterally to tibial area. No erythema, tenderness to palpation, ulceration or oozing.  Neurologic:  sensation intact to light touch.   Skin:  Intact without suspicious lesions or rashes Psych:  normal affect, talkative and pleasant    Impression & Recommendations:  Problem # 1:  LEG EDEMA, BILATERAL (ICD-782.3) Assessment Unchanged Time spent with patient 25 minutes, more than 50% of this time was spent counseling patient on lower extremity edema. Explained that cardiac, pulmonary, kidney and other sources rule out. Most likely venous insufficiency due to morbid obesity and sedentary lifestyle.   She does not want compression hose, will use diuretics as needed (sparingly).  Her updated medication list for this problem includes:    Triamterene-hctz 75-50 Mg Tabs (Triamterene-hctz) .Marland Kitchen... Take one by mouth daily    Furosemide 20 Mg Tabs (Furosemide) .Marland Kitchen... Take 1 tab by mouth daily as needed for swelling.  Complete Medication List: 1)   Triamterene-hctz 75-50 Mg Tabs (Triamterene-hctz) .... Take one by mouth daily 2)  Ramipril 10 Mg Caps (Ramipril) .Marland Kitchen.. 1 cap once daily 3)  Glucovance 1.25-250 Mg Tabs (Glyburide-metformin) .... One in am by mouth 4)  Synthroid 200 Mcg Tabs (Levothyroxine sodium) .... One tab by mouth once daily 5)  Xanax 0.25 Mg Tabs (Alprazolam) .... Take one by mouth three times a day as needed 6)  Vitamin B-12 Cr 1000 Mcg Tbcr (Cyanocobalamin) .... Inection monthly by daughter 73)  Lexapro 20 Mg Tabs (Escitalopram oxalate) .Marland Kitchen.. 1 1/2  tab by mouth daily 8)  Monoject Syringe 25g X 1" 3 Ml Misc (Syringe/needle (disp)) .... As dir 9)  Furosemide 20 Mg Tabs (Furosemide) .... Take 1 tab by mouth daily as needed for swelling. 10)  Actos 30 Mg Tabs (Pioglitazone hcl) .Marland Kitchen.. 1 by mouth daily 11)  Simvastatin 40 Mg Tabs (Simvastatin) .... Take one tablet at bedtime 12)  Omeprazole 40 Mg Cpdr (Omeprazole) .Marland Kitchen.. 1 tab by mouth twice daily. Prescriptions: MONOJECT SYRINGE 25G X 1" 3 ML MISC (SYRINGE/NEEDLE (DISP)) as dir  #100 x 0   Entered and Authorized  by:   [name] MD   Signed by:   [name] MD on 03/09/2010   Method used:   [facility]        [id number]       [address]       [place], [place]  [address]       Ph: [phone]       Fax: [phone]   RxID:   [id number] OMEPRAZOLE 40 MG CPDR (OMEPRAZOLE) 1 tab by mouth twice daily.  #60 x 6   Entered and Authorized by:   [name] MD   Signed by:   [name] MD on 03/09/2010   Method used:   Electronically to        [id number]       [address]       [place], [place]  [address]       Ph: [phone]       Fax: [phone]   RxID:   [id number] SIMVASTATIN 40 MG TABS (SIMVASTATIN) Take one tablet at bedtime  #90 x 3   Entered and Authorized by:   [name] MD   Signed by:   [name] MD on 03/09/2010   Method used:   Electronically to        [id number]  [facility]       [address]       [place], [place]  [address]       Ph: [phone]       Fax: [phone]   RxID:   [id number] ACTOS 30 MG TABS (PIOGLITAZONE HCL) 1 by mouth daily  #90 x 3   Entered and Authorized by:   [name] MD   Signed by:   [name] MD on 03/09/2010   Method used:   Electronically to        [id number]       [address]       [place], [place]  [address]       Ph: [phone]       Fax: [phone]   RxID:   [id number] FUROSEMIDE 20 MG TABS (FUROSEMIDE) Take 1 tab by mouth daily as needed for swelling.  #90 x 3   Entered and Authorized by:   [name] MD   Signed by:   [name] MD on 03/09/2010   Method used:   Electronically to        [id number]       [address]       [place], [place]  [address]       Ph: [phone]       Fax: [phone]   RxID:   [id number]    Orders Added: 1)  Est. Patient Level IV [409[address]]    Current Allergies (reviewed today): ! ERYTHROMYCIN BASE (ERYTHROMYCIN BASE)  Last PAP:  Normal, Satisfactory (05/27/2008 3:28:01 PM) PAP Result Date:  05/26/2009 PAP Result:  normal Last Mammogram:  Normal Bilateral (05/27/2008 3:27:31 PM) Mammogram Result Date:  06/03/2009 Mammogram Result:  normal

## 2010-06-14 NOTE — Progress Notes (Signed)
Summary: refill request for xanax  Phone Note Refill Request Message from:  Fax from Pharmacy  Refills Requested: Medication #1:  XANAX 0.25 MG  TABS Take one by mouth three times a day as needed   Last Refilled: 12/15/2009 Faxed request from [facility].  [phone].  Initial call taken by: Lowella Petties CMA,  March 02, 2010 11:08 AM  Follow-up for Phone Call        Rx called to pharmacy Follow-up by: Linde Gillis CMA Duncan Dull),  March 02, 2010 11:27 AM    Prescriptions: XANAX 0.25 MG  TABS (ALPRAZOLAM) Take one by mouth three times a day as needed  #180 x 0   Entered and Authorized by:   [name] MD   Signed by:   [name] MD on 03/02/2010   Method used:   [facility]phoned to ...       [facility]  [address] [id number]       [address]       [place], [place]  [address]       Ph: [phone]       Fax: [phone]   RxID:   [id number]

## 2010-06-14 NOTE — Letter (Signed)
Summary: [facility]: Immunoassay Fecal Occult Blood (iFOB) Order Form  [place] at [facility]  [address] [place], [place] [address]   Phone: [phone]  Fax: [phone]      [facility]: Immunoassay Fecal Occult Blood (iFOB) Order Form   March 09, 2010 MRN: [id number]   [name] 03-Dec-1950   Physicican Name:__________Talia Aron_______________  Diagnosis Code:___________792.1_______________      [name] MD

## 2010-06-14 NOTE — Progress Notes (Signed)
Summary: refill request for xanax  Phone Note Refill Request Message from:  Fax from Pharmacy  Refills Requested: Medication #1:  XANAX 0.25 MG  TABS Take one by mouth three times a day as needed Faxed request from [facility], form is on your shelf.  Initial call taken by: Lowella Petties CMA,  August 26, 2009 9:38 AM  Follow-up for Phone Call        Form faxed.             Lowella Petties CMA  August 26, 2009 11:26 AM     Prescriptions: [name] 0.25 MG  TABS (ALPRAZOLAM) Take one by mouth three times a day as needed  #180 x 0   Entered and Authorized by:   Shaune Leeks MD   Signed by:   Shaune Leeks MD on 08/26/2009   Method used:   Telephoned to ...       [id number]       [address]       [place], [place]  [address]       Ph: [phone]       Fax: [phone]   RxID:   [id number]

## 2010-06-14 NOTE — Letter (Signed)
Summary: New Patient letter  [facility]  [address] [place], [place] [address]   Phone: [phone]  Fax: [phone]       10/19/2009 MRN: [id number]  [name] [address] [place], [place]  [address]  Dear [name],  Welcome to the Gastroenterology Division at [facility].    You are scheduled to see Dr.  Leone Payor on 11-25-09 at 3:00p.m. on the 3rd floor at [facility], [address]  We ask that you try to arrive at our office 15 minutes prior to your appointment time to allow for check-in.  We would like you to complete the enclosed self-administered evaluation form prior to your visit and bring it with you on the day of your appointment.  We will review it with you.  Also, please bring a complete list of all your medications or, if you prefer, bring the medication bottles and we will list them.  Please bring your insurance card so that we may make a copy of it.  If your insurance requires a referral to see a specialist, please bring your referral form from your primary care physician.  Co-payments are due at the time of your visit and may be paid by cash, check or credit card.     Your office visit will consist of a consult with your physician (includes a physical exam), any laboratory testing he/she may order, scheduling of any necessary diagnostic testing (e.g. x-ray, ultrasound, CT-scan), and scheduling of a procedure (e.g. Endoscopy, Colonoscopy) if required.  Please allow enough time on your schedule to allow for any/all of these possibilities.    If you cannot keep your appointment, please call [phone] to cancel or reschedule prior to your appointment date.  This allows [place] the opportunity to schedule an appointment for another patient in need of care.  If you do not cancel or reschedule by 5 p.m. the business day prior to your appointment date, you will be charged a $50.00 late cancellation/no-show fee.    Thank you for choosing  [facility] for your medical needs.  We appreciate the opportunity to care for you.  Please visit [place] at our website  to learn more about our practice.                     Sincerely,                                                             The Gastroenterology Division

## 2010-06-14 NOTE — Assessment & Plan Note (Signed)
Summary: 3 MONTH FOLLOW UP/[facility]   Vital Signs:  Patient profile:   60 year old female Weight:      338.25 pounds Temp:     98.3 degrees F oral Pulse rate:   76 / minute Pulse rhythm:   regular BP sitting:   118 / 78  (left arm) Cuff size:   large  Vitals Entered By: Sydell Axon LPN (October 19, 1608 9:00 AM) CC: 3 Month follow-up, was recently in [facility]   History of Present Illness: Pt here for 3 month followup. She was recently in the hosp for CP and had Cath. She has felt well since discharge but was suspected of possible esophageal spasm and also poss sleep apnea. They would like referrals done for these workups. She has the habitus for the apnea...we have discussed this before. She definitely needs to lose weight and is now motivated to do just that. She has no acute complaints and feels reasonably well today.  Problems Prior to Update: 1)  Chest Pain  (ICD-786.50) 2)  Unspecified Vitamin D Deficiency  (ICD-268.9) 3)  Hyperlipidemia-mixed  (ICD-272.4) 4)  Hyperlipidemia-mixed  (ICD-272.4) 5)  Coronary Artery Disease, Family Hx  (ICD-V17.3) 6)  Leg Edema, Bilateral  (ICD-782.3) 7)  Hypercholesterolemia, 255/trig. 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 Take One By Mouth Daily 2)  Ramipril 10 Mg Caps (Ramipril) .Marland Kitchen.. 1 Cap Once Daily 3)  Simvastatin 80 Mg Tabs (Simvastatin) .... One Tab By Mouth At Night 4)  Actos 30 Mg Tabs (Pioglitazone Hcl) .... One Tab By  Mouth Once A Day.Marland Kitchen 5)  Glucovance 1.25-250 Mg Tabs (Glyburide-Metformin) .... One in Am By Mouth 6)  Synthroid 200 Mcg Tabs (Levothyroxine Sodium) .... One Tab By Mouth Once Daily 7)  Xanax 0.25 Mg  Tabs (Alprazolam) .... Take One By Mouth Three Times A Day As Needed 8)  Vitamin B-12 Cr 1000 Mcg  Tbcr (Cyanocobalamin) .... Inection Monthly By Daughter 9)  Lexapro 20 Mg  Tabs (Escitalopram Oxalate) .Marland Kitchen.. 11/2  Tab By Mouth Daily 10)  Monoject Syringe 25g X 1" 3 Ml Misc (Syringe/needle (Disp)) .... As Dir 11)  Vitamin D (Ergocalciferol) 50000 Unit Caps (Ergocalciferol) .... One Tab By Mouth Weekly 12)  Cipro 250 Mg Tabs (Ciprofloxacin Hcl) .... One Tab By Mouth Two Times A Day  Allergies: 1)  ! Erythromycin Base (Erythromycin Base)  Physical Exam  General:  Well-developed,well-nourished,in no acute distress; alert,appropriate and cooperative throughout examination, obese. Head:  Normocephalic and atraumatic without obvious abnormalities. No apparent alopecia or balding. Eyes:  Conjunctiva clear bilat. Ears:  External ear exam shows no significant lesions or deformities.  Otoscopic examination reveals some wax in ear canals, tympanic membranes are intact bilaterally without bulging, retraction, inflammation or discharge. Hearing is grossly normal bilaterally. Nose:  External nasal examination shows no deformity or inflammation. Nasal mucosa are pink and moist without lesions or exudates. Mouth:  Oral mucosa and oropharynx without lesions or exudates.  Teeth in good repair. Neck:  No  deformities, masses, or tenderness noted. Chest Wall:  No deformities, masses, or tenderness noted. Lungs:  Normal respiratory effort, chest expands symmetrically. Lungs are clear to auscultation, no crackles or wheezes. Heart:  Normal rate and regular rhythm. S1 and S2 normal without gallop, murmur, click, rub or other extra sounds. Abdomen:  Bowel sounds positive,abdomen soft and non-tender without masses,  organomegaly or hernias noted. Protuberant.   Impression & Recommendations:  Problem # 1:  CHEST PAIN (ICD-786.50) Assessment Improved HAs two types of pain...squeezing in her neck which caused her to go to the ER and chest pressure she has learned is from stress. Perhaps the squeezing is from esophageal spasm as CAD was effectively ruled out. Will refer. Orders: Gastroenterology Referral (GI)  Problem # 2:  CORONARY ARTERY DISEASE, FAMILY HX (ICD-V17.3) Assessment: Unchanged Will refer for Sleep apnea w/u to preclude further damage with Pulm htn therefrom. Orders: Pulmonary Referral (Pulmonary)  Problem # 3:  UNSPECIFIED VITAMIN D DEFICIENCY (ICD-268.9) Will get lvl today to assess and suggest replacement dose to get her off weekly Vit D. Will report via phone tree, Orders: Venipuncture ([address]) T-Vitamin D (25-Hydroxy) ([address]454-09811) Specimen Handling ([address])  Problem # 4:  DIABETES MELLITUS, TYPE II (ICD-250.00) Assessment: Unchanged Continued good control now more important than ever. Encouraged. Her updated medication list for this problem includes:    Ramipril 10 Mg Caps (Ramipril) .Marland Kitchen... 1 cap once daily    Actos 30 Mg Tabs (Pioglitazone hcl) ..... One tab by mouth once a day..    Glucovance 1.25-250 Mg Tabs (Glyburide-metformin) ..... One in am by mouth  Labs Reviewed: Creat: 0.7 (04/06/2009)   Microalbumin: 1.9 (02/02/2006) Reviewed HgBA1c results: 6.1 (04/06/2009)  5.6 (01/07/2009)  Complete Medication List: 1)  Triamterene-hctz 75-50 Mg Tabs (Triamterene-hctz) .... Take one by mouth daily 2)  Ramipril 10 Mg Caps (Ramipril) .Marland Kitchen.. 1 cap once daily 3)  Simvastatin 80 Mg Tabs (Simvastatin) .... One tab by mouth at night 4)  Actos 30 Mg Tabs (Pioglitazone hcl) .... One tab by mouth once a day.Marland Kitchen 5)  Glucovance 1.25-250 Mg Tabs (Glyburide-metformin) .... One in am by mouth 6)  Synthroid 200 Mcg Tabs (Levothyroxine sodium) .... One tab by mouth once daily 7)  Xanax 0.25 Mg  Tabs (Alprazolam) .... Take one by mouth three times a day as needed 8)  Vitamin B-12 Cr 1000 Mcg Tbcr (Cyanocobalamin) .... Inection monthly by daughter 27)  Lexapro 20 Mg Tabs (Escitalopram oxalate) .Marland Kitchen.. 11/2  tab by mouth daily 10)  Monoject Syringe 25g X 1" 3 Ml Misc (Syringe/needle (disp)) .... As dir 54)  Vitamin D (ergocalciferol) 50000 Unit Caps (Ergocalciferol) .... One tab by mouth weekly 12)  Protonix 40 Mg Tbec (Pantoprazole sodium) .... Take one by mouth two times a day  Patient Instructions: 1)  Refer to GI and Pulm.  Current Allergies (reviewed today): ! ERYTHROMYCIN BASE (ERYTHROMYCIN BASE)

## 2010-06-14 NOTE — Progress Notes (Signed)
Summary: refill request for xanax  Phone Note Refill Request Message from:  Fax from Pharmacy  Refills Requested: Medication #1:  XANAX 0.25 MG  TABS Take one by mouth three times a day as needed   Last Refilled: 07/08/2009 Faxed request from [facility], [phone].   Initial call taken by: Lowella Petties CMA,  December 15, 2009 11:34 AM  Follow-up for Phone Call        Medication phoned to pharmacy.  Follow-up by: Delilah Shan CMA Duncan Dull),  December 15, 2009 2:31 PM    Prescriptions: XANAX 0.25 MG  TABS (ALPRAZOLAM) Take one by mouth three times a day as needed  #180 x 0   Entered and Authorized by:   Crawford Givens MD   Signed by:   Crawford Givens MD on 12/15/2009   Method used:   Telephoned to ...       [facility]  [address] [id number]       [address]       [place], [place]  [address]       Ph: [phone]       Fax: [phone]   RxID:   [id number]

## 2010-06-14 NOTE — Progress Notes (Signed)
Summary: Transfer care to Dr. Milinda Antis  Phone Note Call from Patient Call back at Home Phone [phone]   Caller: Patient Call For: Shaune Leeks MD Summary of Call: Patient called and wanted to let Dr. Hetty Ely know that she wants to become a patient of Dr. Milinda Antis when he leaves.  She said she saw Dr. Milinda Antis once and asked her if she could transfer her care over to her, and Dr. Milinda Antis said that it would be fine for her to do so.   Initial call taken by: Linde Gillis CMA Duncan Dull),  June 10, 2009 11:49 AM  Follow-up for Phone Call        Noted, great choice! Follow-up by: Shaune Leeks MD,  June 10, 2009 2:10 PM

## 2010-06-14 NOTE — Progress Notes (Signed)
Summary: pt having chest pain   Phone Note Call from Patient   Caller: Patient Reason for Call: Talk to Nurse, Talk to Doctor Summary of Call: pt having some chest pressure and jaw pain and arm pain  Initial call taken by: Omer Jack,  Oct 08, 2009 1:00 PM  Follow-up for Phone Call        [name] is calling with chest pain in the jaw, left arm with intermittent numbness, and uneasy pressure in the left breast.  She has taken a xanax this morning and it did not help.  She also states that this has been on and off all morninig. She said this is different from the pain she experienced on 5/13. Advised to go to the emergency room and she agreed.   Lisabeth Devoid RN     Appended Document: pt having chest pain  Reviewed Juanito Doom, MD

## 2010-06-14 NOTE — Letter (Signed)
Summary: [place] No Show Letter  [place] [facility]  [address] [place], [place] [address]   Phone: [phone]  Fax: [phone]    07/09/2009 MRN: [id number]  [name] [address] [place], [place]  [address]   Dear [name],   Our records indicate that you missed your scheduled appointment with ___lab__________________ on _2.25.11___________.  Please contact this office to reschedule your appointment as soon as possible.  It is important that you keep your scheduled appointments with your physician, so we can provide you the best care possible.  Please be advised that there may be a charge for "no show" appointments.    Sincerely,   [place] at [facility]

## 2010-06-14 NOTE — Assessment & Plan Note (Signed)
Summary: 3 MONTH FOLLOW UP/[facility]   Vital Signs:  Patient profile:   60 year old female Weight:      341.75 pounds Temp:     97.8 degrees F oral Pulse rate:   68 / minute Pulse rhythm:   regular BP sitting:   114 / 78 Cuff size:   large  Vitals Entered By: Sydell Axon LPN (July 21, 1608 3:48 PM) CC: 3 Month follow-up after labs   History of Present Illness: Ms. Lapenna is a 60 yo female who presents today for a 3 month f/u on lipids and Vit. D after her physical exam 3 months ago.  She switched her statin at the time to move to a generic and also was given one course of a Vitamin D supplement.  She has no other medical complaints and is tolerating her medications well.  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 Take One By Mouth Daily 2)  Ramipril 10 Mg Caps (Ramipril) .Marland Kitchen.. 1 Cap Once Daily 3)  Simvastatin 80 Mg Tabs (Simvastatin) .... One Tab By Mouth At Night 4)  Actos 30 Mg Tabs (Pioglitazone Hcl) .... One Tab By Mouth Once A Day.Marland Kitchen 5)  Glucovance 1.25-250 Mg Tabs  (Glyburide-Metformin) .... One in Am By Mouth 6)  Synthroid 200 Mcg Tabs (Levothyroxine Sodium) .... One Tab By Mouth Once Daily 7)  Xanax 0.25 Mg  Tabs (Alprazolam) .... Take One By Mouth Three Times A Day As Needed 8)  Vitamin B-12 Cr 1000 Mcg  Tbcr (Cyanocobalamin) .... Inection Monthly By Daughter 9)  Lexapro 20 Mg  Tabs (Escitalopram Oxalate) .Marland Kitchen.. 11/2  Tab By Mouth Daily 10)  Monoject Syringe 25g X 1" 3 Ml Misc (Syringe/needle (Disp)) .... As Dir  Allergies: 1)  ! Erythromycin Base (Erythromycin Base)  Physical Exam  General:  Well-developed,well-nourished,in no acute distress; alert,appropriate and cooperative throughout examination Head:  Normocephalic and atraumatic without obvious abnormalities. No apparent alopecia or balding. Eyes:  Conjunctiva clear bilat. Ears:  External ear exam shows no significant lesions or deformities.  Otoscopic examination reveals some wax in ear canals, tympanic membranes are intact bilaterally without bulging, retraction, inflammation or discharge. Hearing is grossly normal bilaterally. Nose:  External nasal examination shows no deformity or inflammation. Nasal mucosa are pink and moist without lesions or exudates. Mouth:  Oral mucosa and oropharynx without lesions or exudates.  Teeth in good repair. Lungs:  Normal respiratory effort, chest expands symmetrically. Lungs are clear to auscultation, no crackles or wheezes. Heart:  Normal rate and regular rhythm. S1 and S2 normal without gallop, murmur, click, rub or other extra sounds.  Impression & Recommendations:  Problem # 1:  UNSPECIFIED VITAMIN D DEFICIENCY (ICD-268.9) Assessment Improved Minimally improved. Pt misunderstood she was to take for three months. Will repeat now and recheck in three months.  Problem # 2:  HYPERLIPIDEMIA TYPE I / IV (ICD-272.3) Assessment: Improved  Continued well controlled, now on Simva 80 instead of Lipitor 40. Continue. Tolerating well. Her updated medication  list for this problem includes:    Simvastatin 80 Mg Tabs (Simvastatin) ..... One tab by mouth at night  Labs Reviewed: SGOT: 20 (07/15/2009)   SGPT: 21 (07/15/2009)   HDL:63.70 (07/15/2009), 51.10 (04/06/2009)  LDL:59 (07/15/2009), 81 (04/06/2009)  Chol:148 (07/15/2009), 157 (04/06/2009)  Trig:125.0 (07/15/2009), 123.0 (04/06/2009)  Complete Medication List: 1)  Triamterene-hctz 75-50 Mg Tabs (Triamterene-hctz) .... Take one by mouth daily 2)  Ramipril 10 Mg Caps (Ramipril) .Marland Kitchen.. 1 cap once daily 3)  Simvastatin 80 Mg Tabs (Simvastatin) .... One tab by mouth at night 4)  Actos 30 Mg Tabs (Pioglitazone hcl) .... One tab by mouth once a day.Marland Kitchen 5)  Glucovance 1.25-250 Mg Tabs (Glyburide-metformin) .... One in am by mouth 6)  Synthroid 200 Mcg Tabs (Levothyroxine sodium) .... One tab by mouth once daily 7)  Xanax 0.25 Mg Tabs (Alprazolam) .... Take one by mouth three times a day as needed 8)  Vitamin B-12 Cr 1000 Mcg Tbcr (Cyanocobalamin) .... Inection monthly by daughter 76)  Lexapro 20 Mg Tabs (Escitalopram oxalate) .Marland Kitchen.. 11/2  tab by mouth daily 10)  Monoject Syringe 25g X 1" 3 Ml Misc (Syringe/needle (disp)) .... As dir 4)  Vitamin D (ergocalciferol) 50000 Unit Caps (Ergocalciferol) .... One tab by mouth weekly 12)  Cipro 250 Mg Tabs (Ciprofloxacin hcl) .... One tab by mouth two times a day  Patient Instructions: 1)  RTC 3 mos, Vit D prior  268.9 2)  Had entered lab tests today and script for Cipro in error in this chart. Prescriptions: CIPRO 250 MG TABS (CIPROFLOXACIN HCL) one tab by mouth two times a day  #10 x 0   Entered and Authorized by:   Shaune Leeks MD   Signed by:   Shaune Leeks MD on 07/21/2009   Method used:   [facility]        [id number]       [address]       [place], [place]  [address]       Ph: [phone]       Fax: [phone]   RxID:   [id number] VITAMIN D (ERGOCALCIFEROL) 50000 UNIT CAPS (ERGOCALCIFEROL)  one tab by mouth weekly  #4 x 3   Entered and Authorized by:   Shaune Leeks MD   Signed by:   Shaune Leeks MD on 07/21/2009   Method used:   [facility]        [id number]       [address]       [place], [place]  [address]       Ph: [phone]       Fax: [phone]   RxID:   [id number]   Current Allergies (reviewed today): ! ERYTHROMYCIN BASE (ERYTHROMYCIN BASE) [facility] [place] and cancelled rx for Cipro per Dr. Hetty Ely.

## 2010-06-14 NOTE — Progress Notes (Signed)
Summary: Pain in left arm and jaw    Phone Note Call from Patient Call back at Home Phone [phone]   Caller: Patient Summary of Call: Pt having pain in jaw and pain in left arm not all the time just sometimes for the past three days Initial call taken by: Judie Grieve,  Sep 24, 2009 11:55 AM  Follow-up for Phone Call        SPOKE WITH PT RE SYMPTOMS C/O JAW AND LEFT ARM PAIN WITH OCC RADIATION  TO BACK X 3  DAYS. NO OPENINGS AVAILABLE AT [place] OFF.REFUSED TO GO TO ER  AT THIS TIME REQUESTS APPT WITH DR Kenyada Hy .APPT SCHEDULED FOR 09/29/09 AT 12:15 PM. PER PT IF S/S INCREASE WILL GO TO ER  OVER WEEKEND  HAS ANXIETY AS WELL .  Follow-up by: Scherrie Bateman, LPN,  Sep 24, 2009 1:19 PM  Additional Follow-up for Phone Call Additional follow up Details #1::        agree. Additional Follow-up by: Gaylord Shih, MD, [facility],  Sep 27, 2009 11:18 AM

## 2010-06-14 NOTE — Assessment & Plan Note (Signed)
Summary: eph/jml  Medications Added LIPITOR 40 MG TABS (ATORVASTATIN CALCIUM) 1 once daily LEXAPRO 20 MG  TABS (ESCITALOPRAM OXALATE) 1 1/2  tab by mouth daily        Visit Type:  EPH Referring Provider:  Laurita Trujillo, [name] Primary Provider:  Shaune Leeks MD  CC:  pt states since her cath she has had 2 episodes of jaw pain and pain between her scalpulas...edema/left ankle...no other complaints today.  History of Present Illness: [name] returns today after being admitted to the hospital with chest pain. She continued to have chest discomfort though her enzymes were negative. She underwent cardiac catheterization which showed nonobstructive plaque. She had normal left ventricular function. This was confirmed by her 2-D echocardiogram. There was no significant mitral regurgitation.  Since discharge she has been exercising by walking on a treadmill 20 minutes every day. She's cut her caloric intake to 1600 calories a day. Unfortunately, she is very discouraged that his weight has not changed. He was recently switched from Avandia or Actos and thinks her lower extremity pain is worse. Her last hemoglobin A1c was 6.1%. Lipid parameters were not at goal for a diabetic in the hospital. Numbers reviewed with patient. Her blood pressures been under good control.  Current Medications (verified): 1)  Triamterene-Hctz 75-50 Mg Tabs (Triamterene-Hctz) .... Take One By Mouth Daily 2)  Ramipril 10 Mg Caps (Ramipril) .Marland Kitchen.. 1 Cap Once Daily 3)  Simvastatin 80 Mg Tabs (Simvastatin) .... One Tab By Mouth At Night 4)  Actos 30 Mg Tabs (Pioglitazone Hcl) .... One Tab By Mouth Once A Day.Marland Kitchen 5)  Glucovance 1.25-250 Mg Tabs (Glyburide-Metformin) .... One in Am By Mouth 6)  Synthroid 200 Mcg Tabs (Levothyroxine Sodium) .... One Tab By Mouth Once Daily 7)  Xanax 0.25 Mg  Tabs (Alprazolam) .... Take One By Mouth Three Times A Day As Needed 8)  Vitamin B-12 Cr 1000 Mcg  Tbcr (Cyanocobalamin) ....  Inection Monthly By Daughter 9)  Lexapro 20 Mg  Tabs (Escitalopram Oxalate) .Marland Kitchen.. 1 1/2  Tab By Mouth Daily 10)  Monoject Syringe 25g X 1" 3 Ml Misc (Syringe/needle (Disp)) .... As Dir 11)  Protonix 40 Mg Tbec (Pantoprazole Sodium) .... Take One By Mouth Two Times A Day  Allergies: 1)  ! 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 U/S wnl except fatty liver 03/06 Adenosine Myoview nml 08/20/07 L Knee Partial medial and Lat Menisectomies and Condroplasty (Dr Thurston Hole) 11/02/08 HOSP CP R/O'd Nonobstruct CAD by Cath  5/27-10/13/2009 CATH and ECHO Nonobstr CAD  EF 60% 10/12/2009 HOSP CP R/O'd Cath w/ CAD EF 60% 5/27-10/13/2009  Family History: Last updated: 04/28/09 Father: Died  at age 67 of a heart attack Mother: Died at age 5 with coronary disease, S/P 14 angioplasties and 5-vessel              coronary artery bypass grafting,  she also had a stroke and diabetes Brother died at the age of 70  of a heart attack, he had his first heart               attack at 59 years of age.  Sister A 15  Social History: Last updated: 11/08/2009 Marital Status: divorced.  lives with daughter pt has children. Children: Daughter who is  married Occupation: Dentist for [employer]  Risk Factors: Alcohol Use: 0 (04/13/2009) Caffeine Use: 4 (04/13/2009) Exercise: no (04/13/2009)  Risk Factors: Smoking Status: quit (04/13/2009) Packs/Day: 11/2 (04/13/2009)  Review of Systems       a few other than history of present illness  Vital Signs:  Patient profile:   60 year old female Height:      66 inches Weight:      339 pounds BMI:     54.91 Pulse rate:   67 / minute Pulse rhythm:   regular BP sitting:   102 / 60  (left arm) Cuff size:   large  Vitals Entered By: [name], CMA (November 17, 2009 4:28 PM)  Physical Exam  General:  obese.  obese.   Head:  normocephalic and atraumatic Eyes:  wglasses, otherwise normal Neck:  Neck supple, no JVD. No masses, thyromegaly or abnormal cervical nodes. Lungs:  Clear bilaterally to auscultation and percussion. Heart:  Non-displaced PMI, chest non-tender; regular rate and rhythm, S1, S2 without murmurs, rubs or gallops. Carotid upstroke normal, no bruit. Normal abdominal aortic size, no bruits. Femorals normal pulses, no bruits. Pedals normal pulses. No edema, no varicosities. Msk:  Back normal, normal gait. Muscle strength and tone normal. Pulses:  pulses normal in all 4 extremities Extremities:  1+ left pedal edema and 1+ right pedal edema.  1+ left pedal edema and 1+ right pedal edema.   Neurologic:  Alert and oriented x 3. Skin:  Intact without lesions or rashes. Psych:  Normal affect.   Problems:  Medical Problems Added: 1)  Dx of Cad, Native Vessel  (ICD-414.01)  Impression & Recommendations:  Problem # 1:  CHEST PAIN (ICD-786.50) Assessment Improved  The following medications were removed from the medication list:    Aspirin 81 Mg Tbec (Aspirin) .Marland Kitchen... Take one tablet by mouth daily Her updated medication list for this problem includes:    Ramipril 10 Mg Caps (Ramipril) .Marland Kitchen... 1 cap once daily  The following medications  were removed from the medication list:    Aspirin 81 Mg Tbec (Aspirin) .Marland Kitchen... Take one tablet by mouth daily Her updated medication list for this problem includes:    Ramipril 10 Mg Caps (Ramipril) .Marland Kitchen... 1 cap once daily  Problem # 2:  CAD, NATIVE VESSEL (ICD-414.01) Assessment: New  The following medications were removed from the medication list:    Aspirin 81 Mg Tbec (Aspirin) .Marland Kitchen... Take one tablet by mouth daily Her updated medication list for this problem includes:    Ramipril 10 Mg Caps (Ramipril) .Marland Kitchen... 1 cap once daily  The following medications were removed from the medication list:    Aspirin 81 Mg Tbec (Aspirin) .Marland Kitchen... Take one tablet by mouth daily Her updated medication list for this problem includes:    Ramipril 10 Mg Caps (Ramipril) .Marland Kitchen... 1 cap once daily  Problem # 3:  OBSTRUCTIVE SLEEP APNEA (ICD-327.23)  Problem # 4:  HYPERLIPIDEMIA-MIXED (ICD-272.4) I will switch her to Lipitor 40 mg q. day. Followup blood work in 6 weeks. Goal LDL 70 or less. Her updated medication list for this problem includes:    Lipitor 40 Mg Tabs (Atorvastatin  calcium) .Marland Kitchen... 1 once daily  Problem # 5:  OBESITY, MORBID (ICD-278.01) Assessment: Unchanged She is very discouraged by her inability to lose any weight. She is exercising as much as she can has cut her caloric intake to 1500 calories a day. She was recently changed to Actos and is also concerned about lower extremity edema. With a hemoglobin A1c of 6.1, I have asked her to stop her Actos and monitor blood sugar closely. I have asked her to cut her caloric intake to 1200 calories per day.  Problem # 6:  LEG EDEMA, BILATERAL (ICD-782.3) Assessment: Deteriorated  Problem # 7:  DIABETES MELLITUS, TYPE II (ICD-250.00) Assessment: Unchanged  The following medications were removed from the medication list:    Actos 30 Mg Tabs (Pioglitazone hcl) ..... One tab by mouth once a day..    Aspirin 81 Mg Tbec (Aspirin) .Marland Kitchen... Take one tablet by mouth  daily Her updated medication list for this problem includes:    Ramipril 10 Mg Caps (Ramipril) .Marland Kitchen... 1 cap once daily    Glucovance 1.25-250 Mg Tabs (Glyburide-metformin) ..... One in am by mouth  The following medications were removed from the medication list:    Aspirin 81 Mg Tbec (Aspirin) .Marland Kitchen... Take one tablet by mouth daily Her updated medication list for this problem includes:    Ramipril 10 Mg Caps (Ramipril) .Marland Kitchen... 1 cap once daily    Actos 30 Mg Tabs (Pioglitazone hcl) ..... One tab by mouth once a day..    Glucovance 1.25-250 Mg Tabs (Glyburide-metformin) ..... One in am by mouth  Other Orders: EKG w/ Interpretation (93000)  Patient Instructions: 1)  Your physician recommends that you schedule a follow-up appointment in:  WITH DR Davinity Fanara 2)  Your physician recommends that you return for lab work in:6-8 WEEKS FASTING  LIPID LIVER MID TO LATE AUGUST 2011 3)  Your physician has recommended you make the following change in your medication: STOP ACTOS  AND SIMVASTATIN  START 4)  LIPITOR 40 MG  1 once daily 5)  1200 CALORIE DIET 6)  MONITOR BLOOD SUGARS 7)  Your physician discussed the importance of regular exercise and recommended that you start or continue a regular exercise program for good health. WALK 2 HOURS PER WEEK 8)  Your physician encouraged you to lose weight for better health. Prescriptions: LIPITOR 40 MG TABS (ATORVASTATIN CALCIUM) 1 once daily  #30 x 11   Entered by:   Scherrie Bateman, LPN   Authorized by:   Gaylord Shih, MD, [facility]   Signed by:   Scherrie Bateman, LPN on 11/12/1599   Method used:   [facility]        [id number]       [address]       [place], [place]  [address]       Ph: [phone]       Fax: [phone]   RxID:   [id number]

## 2010-06-14 NOTE — Progress Notes (Signed)
  Phone Note From Other Clinic   Caller: karen @ [facility] Summary of Call: Ifob never returned, lmom for patient to contact me.  Initial call taken by: Mills Koller,  April 19, 2010 3:04 PM  Follow-up for Phone Call        Patient returned call, I will mail her a new kit that she will do ASAP Follow-up by: Mills Koller,  April 20, 2010 3:25 PM

## 2010-06-14 NOTE — Procedures (Signed)
Summary: EGD: Normal   EGD  Procedure date:  08/11/2004  Findings:      Findings: Normal  Location: [facility]   Patient Name: [name], [name]. MRN:  Procedure Procedures: Panendoscopy (EGD) CPT: 43235.  Personnel: Endoscopist: Iva Boop, MD, [facility].  Referred By: Laurita Quint, MD.  Exam Location: Exam performed in Outpatient Clinic. Outpatient  Patient Consent: Procedure, Alternatives, Risks and Benefits discussed, consent obtained, from patient. Consent was obtained by the RN.  Indications Symptoms: Vomiting. Diarrhea. Abdominal pain, location: epigastric.  History  Current Medications: Patient is not currently taking Coumadin.  Pre-Exam Physical: Performed Aug 11, 2004  Cardio-pulmonary exam, HEENT exam, Mental status exam WNL.  Comments: CBC, CMET, AMYLASE AND LIPASE ARE NORMAL Exam Exam Info: Maximum depth of insertion Duodenum, intended Duodenum. Patient position: on left side. Gastric retroflexion performed. Images taken. ASA Classification: II. Tolerance: excellent.  Sedation Meds: Patient assessed and found to be appropriate for moderate (conscious) sedation. Fentanyl 25 mcg. given IV. Versed 5 mg. given IV. Cetacaine Spray 2 sprays given aerosolized.  Monitoring: BP and pulse monitoring done. Oximetry used. Supplemental O2 given  Findings - Normal: Proximal Esophagus to Duodenal 2nd Portion.   Assessment Normal examination.  Comments: GALBLADDER [place] NEGATIVE EXCEPT FATTY LIVER. SHE COULD NOT TOLERATE COLON PREP DUE TO VOMITING. I STILL ? INTERMITTENT PARTIAL SMALL BOWEL OBSTRUCTION. Events  Unplanned Intervention: No unplanned interventions were required.  Plans Disposition: After procedure patient sent to recovery. After recovery patient sent home.  Comments: CT ABD/PELVIS WITH ORAL AND IV CONTRAST RE: SPELLS OF EPIGASTRIC PAIN, VOMITING AND DIARRHEA  CC:   Laurita Quint, MD  This report was created from the  original endoscopy report, which was reviewed and signed by the above listed endoscopist.  [facility] Reports-CCC]

## 2010-06-16 NOTE — Progress Notes (Signed)
Summary: xanax  Phone Note Refill Request Message from:  Fax from Pharmacy on June 06, 2010 4:57 PM  Refills Requested: Medication #1:  XANAX 0.25 MG  TABS Take one by mouth three times a day as needed   Last Refilled: 05/04/2010 Refill request from [facility] [address]. [phone]  Initial call taken by: Melody Comas,  June 06, 2010 4:57 PM  Follow-up for Phone Call        Rx called to pharmacy Follow-up by: Linde Gillis CMA Duncan Dull),  June 07, 2010 8:12 AM    Prescriptions: XANAX 0.25 MG  TABS (ALPRAZOLAM) Take one by mouth three times a day as needed  #90 x 0   Entered and Authorized by:   [name] MD   Signed by:   [name] MD on 06/07/2010   Method used:   [facility]ph[facility] ...       [facility]  [address] [id number]       [address]       [place], [place]  [address]       Ph: [phone]       Fax: [phone]   RxID:   [id number]

## 2010-06-16 NOTE — Progress Notes (Signed)
Summary: xanax  Phone Note Refill Request Message from:  Fax from Pharmacy on May 02, 2010 9:21 AM  Refills Requested: Medication #1:  XANAX 0.25 MG  TABS Take one by mouth three times a day as needed   Last Refilled: 03/02/2010 Refill request from [facility] [address]. [phone].   Initial call taken by: Melody Comas,  May 02, 2010 9:22 AM  Follow-up for Phone Call        Per prescription three times a day dosing  90 is a one month supply, so filled, will cc: PCP, out of town this week.  Spencer Copland MD  May 02, 2010 9:46 AM     Prescriptions: XANAX 0.25 MG  TABS (ALPRAZOLAM) Take one by mouth three times a day as needed  #90 x 0   Entered and Authorized by:   Hannah Beat MD   Signed by:   Hannah Beat MD on 05/02/2010   Method used:   [facility]phoned to ...       [facility]  [address] [id number]       [address]       [place], [place]  [address]       Ph: [phone]       Fax: [phone]   RxID:   [id number]

## 2010-06-16 NOTE — Progress Notes (Signed)
Summary: Rx Lexapro  Phone Note Refill Request Call back at [phone] Message [facility]:  [facility] on May 11, 2010 9:20 AM  Refills Requested: Medication #1:  LEXAPRO 20 MG  TABS 1 1/2  tab by mouth daily   Last Refilled: 03/07/2010 Received E-script request please advise.   Method Requested: Telephone to Pharmacy Initial call taken by: Linde Gillis CMA Duncan Dull),  May 11, 2010 9:20 AM    Prescriptions: LEXAPRO 20 MG  TABS (ESCITALOPRAM OXALATE) 1 1/2  tab by mouth daily  #60 x 3   Entered and Authorized by:   [name] MD   Signed by:   [name] MD on 05/11/2010   Method used:   [facility]ronically to        [id number]       [address]       [place], [place]  [address]       Ph: [phone]       Fax: [phone]   RxID:   [id number]

## 2010-07-26 ENCOUNTER — Encounter (INDEPENDENT_AMBULATORY_CARE_PROVIDER_SITE_OTHER): Payer: Self-pay | Admitting: *Deleted

## 2010-07-26 ENCOUNTER — Encounter: Payer: Self-pay | Admitting: Family Medicine

## 2010-07-26 ENCOUNTER — Ambulatory Visit (INDEPENDENT_AMBULATORY_CARE_PROVIDER_SITE_OTHER): Payer: BC Managed Care – PPO | Admitting: Family Medicine

## 2010-07-26 DIAGNOSIS — F3289 Other specified depressive episodes: Secondary | ICD-10-CM

## 2010-07-26 DIAGNOSIS — F329 Major depressive disorder, single episode, unspecified: Secondary | ICD-10-CM

## 2010-07-26 DIAGNOSIS — G4700 Insomnia, unspecified: Secondary | ICD-10-CM | POA: Insufficient documentation

## 2010-07-26 DIAGNOSIS — F411 Generalized anxiety disorder: Secondary | ICD-10-CM

## 2010-07-27 ENCOUNTER — Ambulatory Visit (INDEPENDENT_AMBULATORY_CARE_PROVIDER_SITE_OTHER): Payer: BC Managed Care – PPO | Admitting: Psychology

## 2010-07-27 ENCOUNTER — Telehealth: Payer: Self-pay | Admitting: Family Medicine

## 2010-07-27 DIAGNOSIS — F4323 Adjustment disorder with mixed anxiety and depressed mood: Secondary | ICD-10-CM

## 2010-07-28 LAB — DIFFERENTIAL
Lymphocytes Relative: 20 % (ref 12–46)
Lymphs Abs: 1.6 10*3/uL (ref 0.7–4.0)
Monocytes Absolute: 0.5 10*3/uL (ref 0.1–1.0)
Monocytes Relative: 7 % (ref 3–12)
Neutro Abs: 5.4 10*3/uL (ref 1.7–7.7)

## 2010-07-28 LAB — CBC
MCH: 28.7 pg (ref 26.0–34.0)
MCV: 88.1 fL (ref 78.0–100.0)
Platelets: 236 10*3/uL (ref 150–400)
RDW: 14.2 % (ref 11.5–15.5)
WBC: 7.6 10*3/uL (ref 4.0–10.5)

## 2010-07-28 LAB — COMPREHENSIVE METABOLIC PANEL
Albumin: 3.5 g/dL (ref 3.5–5.2)
BUN: 18 mg/dL (ref 6–23)
Creatinine, Ser: 0.74 mg/dL (ref 0.4–1.2)
Potassium: 3.2 mEq/L — ABNORMAL LOW (ref 3.5–5.1)
Total Protein: 6.7 g/dL (ref 6.0–8.3)

## 2010-07-28 LAB — URINALYSIS, ROUTINE W REFLEX MICROSCOPIC
Hgb urine dipstick: NEGATIVE
Protein, ur: NEGATIVE mg/dL
Urobilinogen, UA: 0.2 mg/dL (ref 0.0–1.0)

## 2010-08-01 LAB — GLUCOSE, CAPILLARY
Glucose-Capillary: 118 mg/dL — ABNORMAL HIGH (ref 70–99)
Glucose-Capillary: 105 mg/dL — ABNORMAL HIGH (ref 70–99)
Glucose-Capillary: 109 mg/dL — ABNORMAL HIGH (ref 70–99)
Glucose-Capillary: 109 mg/dL — ABNORMAL HIGH (ref 70–99)
Glucose-Capillary: 122 mg/dL — ABNORMAL HIGH (ref 70–99)
Glucose-Capillary: 134 mg/dL — ABNORMAL HIGH (ref 70–99)

## 2010-08-01 LAB — URINALYSIS, ROUTINE W REFLEX MICROSCOPIC
Bilirubin Urine: NEGATIVE
Glucose, UA: NEGATIVE mg/dL
Hgb urine dipstick: NEGATIVE
Ketones, ur: NEGATIVE mg/dL
Protein, ur: NEGATIVE mg/dL
Urobilinogen, UA: 0.2 mg/dL (ref 0.0–1.0)

## 2010-08-01 LAB — BASIC METABOLIC PANEL
BUN: 20 mg/dL (ref 6–23)
BUN: 23 mg/dL (ref 6–23)
CO2: 28 mEq/L (ref 19–32)
CO2: 31 mEq/L (ref 19–32)
Calcium: 8.6 mg/dL (ref 8.4–10.5)
Calcium: 8.8 mg/dL (ref 8.4–10.5)
Chloride: 98 mEq/L (ref 96–112)
Creatinine, Ser: 0.93 mg/dL (ref 0.4–1.2)
Creatinine, Ser: 0.97 mg/dL (ref 0.4–1.2)
GFR calc Af Amer: >60 mL/min (ref >60)
GFR calc Af Amer: >60 mL/min (ref >60)
GFR calc Af Amer: >60 mL/min (ref >60)
GFR calc non Af Amer: >60 mL/min (ref >60)
Glucose, Bld: 108 mg/dL — ABNORMAL HIGH (ref 70–99)
Sodium: 135 mEq/L (ref 135–145)

## 2010-08-01 LAB — POCT CARDIAC MARKERS
CKMB, poc: <1 ng/mL — ABNORMAL LOW (ref 1.0–8.0)
Myoglobin, poc: 59.2 ng/mL (ref 12–200)
Myoglobin, poc: 74.5 ng/mL (ref 12–200)

## 2010-08-01 LAB — LIPID PANEL
Cholesterol: 166 mg/dL (ref 0–200)
HDL: 51 mg/dL (ref >39)
Triglycerides: 160 mg/dL — ABNORMAL HIGH (ref <150)

## 2010-08-01 LAB — TSH: TSH: 1.176 u[IU]/mL (ref 0.350–4.500)

## 2010-08-01 LAB — PROTIME-INR
INR: 0.93 (ref 0.00–1.49)
Prothrombin Time: 12.4 seconds (ref 11.6–15.2)

## 2010-08-01 LAB — HEMOGLOBIN A1C: Mean Plasma Glucose: 123 mg/dL — ABNORMAL HIGH (ref <117)

## 2010-08-01 LAB — DIFFERENTIAL
Basophils Relative: 0 % (ref 0–1)
Monocytes Relative: 8 % (ref 3–12)
Neutro Abs: 4.4 10*3/uL (ref 1.7–7.7)
Neutrophils Relative %: 68 % (ref 43–77)

## 2010-08-01 LAB — CBC
MCHC: 34.3 g/dL (ref 30.0–36.0)
Platelets: 226 10*3/uL (ref 150–400)
RBC: 4.51 MIL/uL (ref 3.87–5.11)
WBC: 6.4 10*3/uL (ref 4.0–10.5)

## 2010-08-01 LAB — CARDIAC PANEL(CRET KIN+CKTOT+MB+TROPI)
CK, MB: 0.9 ng/mL (ref 0.3–4.0)
Total CK: 56 U/L (ref 7–177)

## 2010-08-01 LAB — TROPONIN I: Troponin I: <0.01 ng/mL (ref 0.00–0.06)

## 2010-08-02 NOTE — Assessment & Plan Note (Signed)
Summary: ANXIETY PROBLEMS / LFW   Vital Signs:  Patient profile:   60 year old female Height:      66 inches Weight:      348.25 pounds BMI:     56.41 Temp:     98.0 degrees F oral Pulse rate:   80 / minute Pulse rhythm:   regular BP sitting:   140 / 84  (left arm) Cuff size:   large  Vitals Entered By: Benny Lennert CMA Duncan Dull) (July 26, 2010 8:51 AM)  History of Present Illness: Chief complaint anxiety 60 year old female pt of Dr. Elmer Sow with hx of depression and anxiety who presents with worsening anxiety.   Increase in stressat work, change in Insurance account manager. Saw Dr. Vincente Poli 3 weeks ago.. for menopausal symptoms. Increased to 3 xanax a day.  Changed from lexapro to zoloft for cost... on 50 mg daily. Continues having issues.  Cannot function currently.. crying constantly. Cannot concentrate to do her work.  Yesterday took 5 xanax over the day at work to get through constant anxiety. Poor memory, cotton in head. Feels nervous constatntly. Feels like she cannot breathe.  Trouble sleeping at night.. xanax only helps for a short time.   No SI... a lot of hopelessness.     Problems Prior to Update: 1)  Screening, Colon Cancer  (ICD-V76.51) 2)  Nausea and Vomiting  (ICD-787.01) 3)  Diarrhea  (ICD-787.91) 4)  Epigastric Pain  (ICD-789.06) 5)  Cad, Native Vessel  (ICD-414.01) 6)  Obstructive Sleep Apnea  (ICD-327.23) 7)  Chest Pain  (ICD-786.50) 8)  Unspecified Vitamin D Deficiency  (ICD-268.9) 9)  Hyperlipidemia-mixed  (ICD-272.4) 10)  Hyperlipidemia-mixed  (ICD-272.4) 11)  Coronary Artery Disease, Family Hx  (ICD-V17.3) 12)  Leg Edema, Bilateral  (ICD-782.3) 13)  Hypercholesterolemia, 255/trig. 238  (ICD-272.0) 14)  Obesity, Morbid  (ICD-278.01) 15)  Diabetes Mellitus, Type II  (ICD-250.00) 16)  Cellulitis, Leg, Right  (ICD-682.6) 17)  Health Maintenance Exam  (ICD-V70.0) 18)  Dermatitis  (ICD-692.9) 19)  Lumbar Strain  (ICD-847.2) 20)  Hypokalemia   (ICD-276.8) 21)  B12 Deficiency  (ICD-266.2) 22)  Medial Meniscus Tear, Right, Chronic (DILWORTH)  (ICD-836.0) 23)  Hypothyroidism  (ICD-244.9) 24)  Depression  (ICD-311) 25)  Anxiety With Pms  (ICD-300.00)  Current Medications (verified): 1)  Triamterene-Hctz 75-50 Mg Tabs (Triamterene-Hctz) .... Take One By Mouth Daily 2)  Ramipril 10 Mg Caps (Ramipril) .Marland Kitchen.. 1 Cap Once Daily 3)  Glucovance 1.25-250 Mg Tabs (Glyburide-Metformin) .... One in Am By Mouth 4)  Synthroid 200 Mcg Tabs (Levothyroxine Sodium) .... One Tab By Mouth Once Daily 5)  Alprazolam 0.5 Mg Tabs (Alprazolam) .... Take One Tablet Every 8 Hours 6)  Vitamin B-12 Cr 1000 Mcg  Tbcr (Cyanocobalamin) .... Inection Monthly By Daughter 7)  Monoject Syringe 25g X 1" 3 Ml Misc (Syringe/needle (Disp)) .... As Dir 8)  Furosemide 20 Mg Tabs (Furosemide) .... Take 1 Tab By Mouth Daily As Needed For Swelling. 9)  Actos 30 Mg Tabs (Pioglitazone Hcl) .Marland Kitchen.. 1 By Mouth Daily 10)  Simvastatin 40 Mg Tabs (Simvastatin) .... Take One Tablet At Bedtime 11)  Omeprazole 40 Mg Cpdr (Omeprazole) .Marland Kitchen.. 1 Tab By Mouth Twice Daily. 12)  Zoloft 50 Mg Tabs (Sertraline Hcl) .... Take One Tablet Daily  Allergies: 1)  ! Erythromycin Base (Erythromycin Base)  Past History:  Past medical, surgical, family and social histories (including risk factors) reviewed, and no changes noted (except as noted below).  Past Medical History: Reviewed history from 11/24/2009 and  no changes required. B12 Deficiency Anxiety Disorder Coronary Artery Disease Depression Diabetes Hyperlipidemia Hypothyroidism Obesity  Past Surgical History: Reviewed history from 10/17/2009 and no changes required. NSVD x1 Persantine Cardiolite- wnl 97- 02/00 Ulnar osteotomy  ([name]), negative wrist 10/01 Colonoscopy 08/11/04 Abd. U/S wnl except fatty liver 03/06 Adenosine Myoview nml 08/20/07 L Knee Partial medial and Lat Menisectomies and Condroplasty (Dr Thurston Hole) 11/02/08 HOSP CP  R/O'd Nonobstruct CAD by Cath  5/27-10/13/2009 CATH and ECHO Nonobstr CAD  EF 60% 10/12/2009 HOSP CP R/O'd Cath w/ CAD EF 60% 5/27-10/13/2009  Family History: Reviewed history from 04/13/2009 and no changes required. Father: Died  at age 4 of a heart attack Mother: Died at age 25 with coronary disease, S/P 14 angioplasties and 5-vessel              coronary artery bypass grafting,  she also had a stroke and diabetes Brother died at the age of 84 of a heart attack, he had his first heart               attack at 33 years of age.  Sister A 53  Social History: Reviewed history from 11/24/2009 and no changes required. Marital Status: divorced.  lives with daughter who is married Occupation: Nature conservation officer for [employer]  Review of Systems General:  Complains of fatigue; denies fever. CV:  Denies chest pain or discomfort. Resp:  Denies shortness of breath. GI:  Denies abdominal pain. GU:  Denies dysuria.  Physical Exam  General:  Tearful,anxious Mouth:  Oral mucosa and oropharynx without lesions or exudates.  Teeth in good repair. Neck:  no carotid bruit or thyromegaly no cervical or supraclavicular lymphadenopathy  Lungs:  Normal respiratory effort, chest expands symmetrically. Lungs are clear to auscultation, no crackles or wheezes. Heart:  Normal rate and regular rhythm. S1 and S2 normal without gallop, murmur, click, rub or other extra sounds. Pulses:  R and L carotid,radial,femoral,dorsalis pedis and posterior tibial pulses are full and equal bilaterally Extremities:  1+ pitting pedal edema bilaterally to tibial area. No erythema, tenderness to palpation, ulceration or oozing.  Psych:  depressed affect, poor eye contact, tearful, and severely anxious.     Impression & Recommendations:  Problem # 1:  DEPRESSION (ICD-311) Discussed pt current symptoms and possible treatment options.  No current SI. Able to contract for safety.  Will set her up with pshycology  referral to Dr. Laymond Purser in next week. Increase zoloft to 100 mg daily. Start trazodone for sleep. Limit xanax use given tolerance building effect. Total visit time > 50% spent counseling and cordinating patients care   Close follow up with primary MD in 2 weeks... call earlier if not doing well.  The following medications were removed from the medication list:    Lexapro 20 Mg Tabs (Escitalopram oxalate) .Marland Kitchen... 1 1/2  tab by mouth daily Her updated medication list for this problem includes:    Alprazolam 0.5 Mg Tabs (Alprazolam) .Marland Kitchen... Take one tablet every 8 hours    Sertraline Hcl 100 Mg Tabs (Sertraline hcl) .Marland Kitchen... Take 1 tablet by mouth once a day    Trazodone Hcl 50 Mg Tabs (Trazodone hcl) .Marland Kitchen... 1 tab po at bedtime as needed insomnia  Orders: Psychology Referral (Psychology)  Problem # 2:  ANXIETY (ICD-300.00)  The following medications were removed from the medication list:    Lexapro 20 Mg Tabs (Escitalopram oxalate) .Marland Kitchen... 1 1/2  tab by mouth daily Her updated medication list for this problem includes:  Alprazolam 0.5 Mg Tabs (Alprazolam) .Marland Kitchen... Take one tablet every 8 hours    Sertraline Hcl 100 Mg Tabs (Sertraline hcl) .Marland Kitchen... Take 1 tablet by mouth once a day    Trazodone Hcl 50 Mg Tabs (Trazodone hcl) .Marland Kitchen... 1 tab po at bedtime as needed insomnia  Orders: Psychology Referral (Psychology)  Problem # 3:  INSOMNIA (ICD-780.52)  Complete Medication List: 1)  Triamterene-hctz 75-50 Mg Tabs (Triamterene-hctz) .... Take one by mouth daily 2)  Ramipril 10 Mg Caps (Ramipril) .Marland Kitchen.. 1 cap once daily 3)  Glucovance 1.25-250 Mg Tabs (Glyburide-metformin) .... One in am by mouth 4)  Synthroid 200 Mcg Tabs (Levothyroxine sodium) .... One tab by mouth once daily 5)  Alprazolam 0.5 Mg Tabs (Alprazolam) .... Take one tablet every 8 hours 6)  Vitamin B-12 Cr 1000 Mcg Tbcr (Cyanocobalamin) .... Inection monthly by daughter 44)  Monoject Syringe 25g X 1" 3 Ml Misc (Syringe/needle (disp))  .... As dir 8)  Furosemide 20 Mg Tabs (Furosemide) .... Take 1 tab by mouth daily as needed for swelling. 9)  Actos 30 Mg Tabs (Pioglitazone hcl) .Marland Kitchen.. 1 by mouth daily 10)  Simvastatin 40 Mg Tabs (Simvastatin) .... Take one tablet at bedtime 11)  Omeprazole 40 Mg Cpdr (Omeprazole) .Marland Kitchen.. 1 tab by mouth twice daily. 12)  Sertraline Hcl 100 Mg Tabs (Sertraline hcl) .... Take 1 tablet by mouth once a day 13)  Trazodone Hcl 50 Mg Tabs (Trazodone hcl) .Marland Kitchen.. 1 tab po at bedtime as needed insomnia  Patient Instructions: 1)  Referral Appointment Information 2)  Day/Date: 3)  Time: 4)  Place/MD: 5)  Address: 6)  Phone/Fax: 7)  Patient given appointment information. Information/Orders faxed/mailed. 8)  Increase zoloft to 100 mg daily.  9)   Start trazodone at bedtime. 10)  Limit xanax use as able to. 11)   medical leave from work x 2 weeks.. till follow up with Dr. Dayton Martes. 12)  Please schedule a follow-up appointment in 2 weeks with Dr. Dayton Martes.  Prescriptions: TRAZODONE HCL 50 MG TABS (TRAZODONE HCL) 1 tab po at bedtime as needed insomnia  #30 x 2   Entered and Authorized by:   Kerby Nora MD   Signed by:   Kerby Nora MD on 07/26/2010   Method used:   Electronically to        [id number] (retail)       [address]       [place], [place]  [address]       Ph: [phone]       Fax: [phone]   RxID:   [id number] SERTRALINE HCL 100 MG TABS (SERTRALINE HCL) Take 1 tablet by mouth once a day  #30 x 3   Entered and Authorized by:   Kerby Nora MD   Signed by:   Kerby Nora MD on 07/26/2010   Method used:   Electronically to        [id number] (retail)       [address]       [place], [place]  [address]       Ph: [phone]       Fax: [phone]   RxID:   [id number]    Orders Added: 1)  Est. Patient Level IV [09323] 2)  Psychology Referral [Psychology]    Current Allergies (reviewed today): ! ERYTHROMYCIN BASE (ERYTHROMYCIN BASE)

## 2010-08-02 NOTE — Letter (Signed)
Summary: Out of Work  [facility]  [address] [place], [place] [address]   Phone: [phone]  Fax: [phone]    July 26, 2010   Employee:  ELIZABELLA NOLET    To Whom It May Concern:   For Medical reasons, please excuse the above named employee from work for the following dates:  Start:  July 26, 2010 9:29 AM   End:   May return to work on March 28th 2012  If you need additional information, please feel free to contact our office.         Sincerely,  Kerby Nora MD

## 2010-08-02 NOTE — Progress Notes (Signed)
  Phone Note Call from Patient   Caller: Patient Summary of Call: Patient left message on my machine that she saw Dr Laymond Purser today and it was not a good fit for her. she is wanting to see a Psychiatrist now. I spoke to her and told her I would call the 2 Drs in [place] and try to make her an appt ASAP and would call her back on Thursday when their offices reopen. Initial call taken by: Carlton Adam,  July 27, 2010 5:16 PM  Follow-up for Phone Call        OKay..  I would recommend making and appt with their pshycologist as well if able.  Follow-up by: Kerby Nora MD,  July 27, 2010 11:06 PM  Additional Follow-up for Phone Call Additional follow up Details #1::        Appt made with Dr Imogene Burn on 08/04/2010 Psychiatry. Told pt what you said about continuing with Psychologist, she says she will stay with Dr Laymond Purser and see if things go better the 2nd appt, otherwise she will ask Dr Imogene Burn to refer her to a new Psychologist. Additional Follow-up by: Carlton Adam,  July 28, 2010 10:46 AM    Additional Follow-up for Phone Call Additional follow up Details #2::    all reasonable. Hannah Beat MD  July 28, 2010 12:21 PM

## 2010-08-03 ENCOUNTER — Ambulatory Visit (INDEPENDENT_AMBULATORY_CARE_PROVIDER_SITE_OTHER): Payer: BC Managed Care – PPO | Admitting: Psychology

## 2010-08-03 DIAGNOSIS — F4323 Adjustment disorder with mixed anxiety and depressed mood: Secondary | ICD-10-CM

## 2010-08-04 ENCOUNTER — Encounter: Payer: Self-pay | Admitting: Family Medicine

## 2010-08-04 DIAGNOSIS — J189 Pneumonia, unspecified organism: Secondary | ICD-10-CM

## 2010-08-09 ENCOUNTER — Ambulatory Visit (INDEPENDENT_AMBULATORY_CARE_PROVIDER_SITE_OTHER): Payer: BC Managed Care – PPO | Admitting: Family Medicine

## 2010-08-09 ENCOUNTER — Encounter: Payer: Self-pay | Admitting: *Deleted

## 2010-08-09 ENCOUNTER — Ambulatory Visit: Payer: BC Managed Care – PPO | Admitting: Psychology

## 2010-08-09 ENCOUNTER — Encounter: Payer: Self-pay | Admitting: Family Medicine

## 2010-08-09 DIAGNOSIS — E538 Deficiency of other specified B group vitamins: Secondary | ICD-10-CM

## 2010-08-09 DIAGNOSIS — E559 Vitamin D deficiency, unspecified: Secondary | ICD-10-CM

## 2010-08-09 DIAGNOSIS — F329 Major depressive disorder, single episode, unspecified: Secondary | ICD-10-CM

## 2010-08-09 DIAGNOSIS — E119 Type 2 diabetes mellitus without complications: Secondary | ICD-10-CM

## 2010-08-09 DIAGNOSIS — F3289 Other specified depressive episodes: Secondary | ICD-10-CM

## 2010-08-09 DIAGNOSIS — E039 Hypothyroidism, unspecified: Secondary | ICD-10-CM

## 2010-08-09 DIAGNOSIS — E785 Hyperlipidemia, unspecified: Secondary | ICD-10-CM

## 2010-08-09 NOTE — Progress Notes (Signed)
  Subjective:    Patient ID: [name], female    DOB: 1950-11-06, 60 y.o.   MRN: [id number]  HPI 60 year old female seen 2 weeks ago for worsening depression and anxiety. Placed on work leave.  Increased zoloft to 100 mg daily.  Started on trazodone for sleep.  Referred to counsling.  Per pt she is crying less... But only 5% better. Cannot think clearly. Poor focus. Angry, irritable.  Trazodone has helped sleep tremendously. Using xanax daily.Marland Kitchen occ  Two times a day. Waking up in AMs anxiety is the worst.  Has seen Dr. [name]... She feels this is not a great fit.  Has appt with Dr. [name] Pshychiatrist today at 3 PM.    No SI... a lot of hopelessness.     Review of Systems  Constitutional: Negative for fever, appetite change and fatigue.  HENT: Negative for ear pain and neck pain.   Respiratory: Negative for cough and shortness of breath.   Cardiovascular: Negative for chest pain and leg swelling.  Psychiatric/Behavioral: Positive for behavioral problems, confusion, dysphoric mood and decreased concentration. Negative for suicidal ideas, hallucinations, sleep disturbance and self-injury. The patient is not hyperactive.        Objective:   Physical Exam  Constitutional: Vital signs are normal. She appears well-developed. No distress.       OBESE  Neck: Trachea normal. Carotid bruit is not present. No mass and no thyromegaly present.  Cardiovascular: Regular rhythm, S1 normal, S2 normal, normal heart sounds and normal pulses.  Exam reveals no gallop.   No murmur heard. Pulmonary/Chest: Effort normal and breath sounds normal.  Psychiatric: Thought content normal. Her mood appears anxious. Her speech is not rapid and/or pressured, not delayed and not tangential. She is not aggressive, is not hyperactive and not combative. Cognition and memory are normal. She exhibits a depressed mood. She expresses no suicidal ideation. She expresses no suicidal plans. She is  communicative.          Assessment & Plan:  Depression, poor control: Minimal improvement so far on Zoloft 100 mg daily... Has appt with Dr. [name] (pshychiatry) today.. Can consider med change or giving med longer to work.  Continue trazodone for insomnia. Will take her out of work for a minimum of 2 more weeks until med effects present or  Titrated/changed.  Encouraged continuing counseling here or at psychiatrist.  DM, High cholesterol, vit D def, vit B12... Due for reeval and follow up for these. Schedule with labs prior.

## 2010-08-09 NOTE — Patient Instructions (Signed)
Follow up in 2 weeks for Diabetes 30 min OV. Labs prior to appt.

## 2010-08-11 ENCOUNTER — Other Ambulatory Visit: Payer: Self-pay | Admitting: Family Medicine

## 2010-08-15 ENCOUNTER — Telehealth: Payer: Self-pay | Admitting: *Deleted

## 2010-08-15 NOTE — Telephone Encounter (Signed)
I believe I filled them out last Friday.. Check the yellow folder, Heather.

## 2010-08-15 NOTE — Telephone Encounter (Signed)
Pt is asking about the status of disability forms that were dropped off on 3/27.  I dont see anything in the chart about it.

## 2010-08-16 ENCOUNTER — Telehealth: Payer: Self-pay | Admitting: *Deleted

## 2010-08-16 NOTE — Telephone Encounter (Signed)
Patient called again to check on the status of her disability forms, I checked the yellow folder and I was unable to find it. She also says that they need to be faxed to [phone], to attention [facility].

## 2010-08-16 NOTE — Telephone Encounter (Signed)
I know I completed this when I was in the office the last time ... It must have been sent to scanning or Herbert Seta has somewhere.  Please apologize to pt and have her office send [place] another form which I will complete again unless Herbert Seta is back tommorrow and knows where it is.  (I can come into office for this specifically)

## 2010-08-17 LAB — ELECTROLYTE PANEL
Chloride, Serum: 96
Potassium, Bld: 3.5
Sodium, serum: 138

## 2010-08-17 LAB — LIPID PANEL
HDL: 60 mg/dL (ref 35–70)
LDL Calculated: 77 mg/dL (ref 0–160)

## 2010-08-17 LAB — T4, FREE: Free T4: 1.47

## 2010-08-17 LAB — MYOGLOBIN, SERUM: Myoglobin: 46 ng/mL

## 2010-08-17 NOTE — Telephone Encounter (Signed)
Found disability forms. Patient came by and picked them up.

## 2010-08-22 LAB — POCT I-STAT, CHEM 8
BUN: 36 mg/dL — ABNORMAL HIGH (ref 6–23)
Creatinine, Ser: 1.1 mg/dL (ref 0.4–1.2)
Glucose, Bld: 116 mg/dL — ABNORMAL HIGH (ref 70–99)
Hemoglobin: 12.9 g/dL (ref 12.0–15.0)
TCO2: 28 mmol/L (ref 0–100)

## 2010-08-22 LAB — BASIC METABOLIC PANEL
BUN: 37 mg/dL — ABNORMAL HIGH (ref 6–23)
Calcium: 9.1 mg/dL (ref 8.4–10.5)
GFR calc non Af Amer: 41 mL/min — ABNORMAL LOW (ref >60)
Glucose, Bld: 121 mg/dL — ABNORMAL HIGH (ref 70–99)

## 2010-09-01 ENCOUNTER — Encounter: Payer: Self-pay | Admitting: Family Medicine

## 2010-09-05 ENCOUNTER — Other Ambulatory Visit: Payer: Self-pay | Admitting: Family Medicine

## 2010-09-05 ENCOUNTER — Other Ambulatory Visit (INDEPENDENT_AMBULATORY_CARE_PROVIDER_SITE_OTHER): Payer: BC Managed Care – PPO | Admitting: Family Medicine

## 2010-09-05 DIAGNOSIS — E119 Type 2 diabetes mellitus without complications: Secondary | ICD-10-CM

## 2010-09-05 DIAGNOSIS — E559 Vitamin D deficiency, unspecified: Secondary | ICD-10-CM

## 2010-09-05 DIAGNOSIS — E538 Deficiency of other specified B group vitamins: Secondary | ICD-10-CM

## 2010-09-05 LAB — COMPREHENSIVE METABOLIC PANEL
Albumin: 3.7 g/dL (ref 3.5–5.2)
Alkaline Phosphatase: 69 U/L (ref 39–117)
BUN: 25 mg/dL — ABNORMAL HIGH (ref 6–23)
CO2: 27 mEq/L (ref 19–32)
GFR: 49.94 mL/min — ABNORMAL LOW (ref >60.00)
Glucose, Bld: 116 mg/dL — ABNORMAL HIGH (ref 70–99)
Total Bilirubin: 0.6 mg/dL (ref 0.3–1.2)
Total Protein: 6.8 g/dL (ref 6.0–8.3)

## 2010-09-05 LAB — MICROALBUMIN / CREATININE URINE RATIO
Creatinine,U: 154.6 mg/dL
Microalb, Ur: 0.6 mg/dL (ref 0.0–1.9)

## 2010-09-05 LAB — VITAMIN B12: Vitamin B-12: 315 pg/mL (ref 211–911)

## 2010-09-05 NOTE — Progress Notes (Signed)
Addended by: Liane Comber on: 09/05/2010 09:11 AM   Modules accepted: Orders

## 2010-09-06 LAB — VITAMIN D 25 HYDROXY (VIT D DEFICIENCY, FRACTURES): Vit D, 25-Hydroxy: 20 ng/mL — ABNORMAL LOW (ref 30–89)

## 2010-09-09 ENCOUNTER — Ambulatory Visit (INDEPENDENT_AMBULATORY_CARE_PROVIDER_SITE_OTHER): Payer: BC Managed Care – PPO | Admitting: Family Medicine

## 2010-09-09 ENCOUNTER — Encounter: Payer: Self-pay | Admitting: Family Medicine

## 2010-09-09 DIAGNOSIS — E876 Hypokalemia: Secondary | ICD-10-CM

## 2010-09-09 DIAGNOSIS — E039 Hypothyroidism, unspecified: Secondary | ICD-10-CM

## 2010-09-09 DIAGNOSIS — E559 Vitamin D deficiency, unspecified: Secondary | ICD-10-CM

## 2010-09-09 DIAGNOSIS — F3289 Other specified depressive episodes: Secondary | ICD-10-CM

## 2010-09-09 DIAGNOSIS — E119 Type 2 diabetes mellitus without complications: Secondary | ICD-10-CM

## 2010-09-09 DIAGNOSIS — F329 Major depressive disorder, single episode, unspecified: Secondary | ICD-10-CM

## 2010-09-09 DIAGNOSIS — E785 Hyperlipidemia, unspecified: Secondary | ICD-10-CM

## 2010-09-09 MED ORDER — ERGOCALCIFEROL 1.25 MG (50000 UT) PO CAPS: 50000.0000 [IU] | ORAL_CAPSULE | ORAL | Status: DC

## 2010-09-09 NOTE — Assessment & Plan Note (Signed)
Well controlled. Continue current medication (keep in mind some of leg pain may be SE, but she has beenon this med for a while)

## 2010-09-09 NOTE — Assessment & Plan Note (Signed)
Seeing pshyc... Still poor control...likely primary cause of memory and concentration issues.

## 2010-09-09 NOTE — Progress Notes (Signed)
Subjective:    Patient ID: [name], female    DOB: Jan 22, 1951, 60 y.o.   MRN: [id number]  HPI Diabetes:  On glucovance and actos.. Slight increase in A1C compared to  INterested in changing actos..concerned about media SE. No SE to these meds. She has lost 10 lbs in last few months. Hypoglycemic episodes: None Feet problems: None Blood Sugars averaging: Does not check at home. eye exam within last year:  Hypertension:    Using medication without problems or lightheadedness:  Chest pain with exertion: None Edema: occ.. Uses furosemide Short of breath: Average home BPs: Other issues:  Occ stinging sensation in toes, some burning in feet. Not happening regularly, maybe once a week. Legs hurt at night 3-4 times a week.. Both occuring 2-3 months. B12 nml. TSH nml. Vit D low. (HAs had some issue with this in the past)  Elevated Cholesterol: Well controlled on statin Using medications without problems: yes Muscle aches: see above. Other complaints: None PMH and SH reviewed.   Vital signs, Meds and allergies reviewed.  Diabetic foot exam: Normal inspection No skin breakdown No calluses  Normal DP pulses Normal sensation to light tough and monofilament Nails normal  HAs appt for CPX ,mammogram pap.. 09/21/2010 with GYN.   Review of Systems  Constitutional: Negative for fever and fatigue.  HENT: Negative for ear pain.   Eyes: Negative for pain.  Respiratory: Negative for chest tightness and shortness of breath.   Cardiovascular: Negative for chest pain, palpitations and leg swelling.  Gastrointestinal: Negative for abdominal pain.  Genitourinary: Negative for dysuria.  Neurological:       [Memory loss, much worse since depression Psychiatric/Behavioral: Positive for dysphoric mood and decreased concentration. Negative for suicidal ideas and self-injury. The patient is nervous/anxious.        Objective:   Physical Exam  Constitutional: Vital signs are normal.  She appears well-developed and well-nourished. She is cooperative.  Non-toxic appearance. She does not appear ill. No distress.  HENT:  Head: Normocephalic.  Right Ear: Hearing, tympanic membrane, external ear and ear canal normal. Tympanic membrane is not erythematous, not retracted and not bulging.  Left Ear: Hearing, tympanic membrane, external ear and ear canal normal. Tympanic membrane is not erythematous, not retracted and not bulging.  Nose: No mucosal edema or rhinorrhea. Right sinus exhibits no maxillary sinus tenderness and no frontal sinus tenderness. Left sinus exhibits no maxillary sinus tenderness and no frontal sinus tenderness.  Mouth/Throat: Uvula is midline, oropharynx is clear and moist and mucous membranes are normal.  Eyes: Conjunctivae, EOM and lids are normal. Pupils are equal, round, and reactive to light. No foreign bodies found.  Neck: Trachea normal and normal range of motion. Neck supple. Carotid bruit is not present. No mass and no thyromegaly present.  Cardiovascular: Normal rate, regular rhythm, S1 normal, S2 normal, normal heart sounds, intact distal pulses and normal pulses.  Exam reveals no gallop and no friction rub.   No murmur heard. Pulmonary/Chest: Effort normal and breath sounds normal. Not tachypneic. No respiratory distress. She has no decreased breath sounds. She has no wheezes. She has no rhonchi. She has no rales.  Abdominal: Soft. Normal appearance and bowel sounds are normal. There is no tenderness.  Neurological: She is alert.  Skin: Skin is warm, dry and intact. No rash noted.  Psychiatric: Her speech is normal and behavior is normal. Judgment and thought content normal. Her mood appears not anxious. Cognition and memory are impaired. She does not exhibit a  depressed mood.          Assessment & Plan:

## 2010-09-09 NOTE — Assessment & Plan Note (Signed)
Well controlled. Continue current medication.  

## 2010-09-09 NOTE — Assessment & Plan Note (Signed)
May be cause of some of leg pain. Supplement x 12 weeks then start maintenance Ca and vit D.

## 2010-09-09 NOTE — Patient Instructions (Addendum)
Follow Bps at home.. Call if greater than 130/80 consistently. Supplement vit D high dose x 12 weeks...once in normal range.. Start calcium and vit D 600mg /400IU twice daily for maintenance.

## 2010-09-09 NOTE — Assessment & Plan Note (Signed)
resolved 

## 2010-09-15 ENCOUNTER — Encounter: Payer: Self-pay | Admitting: Family Medicine

## 2010-09-15 ENCOUNTER — Ambulatory Visit (INDEPENDENT_AMBULATORY_CARE_PROVIDER_SITE_OTHER): Payer: BC Managed Care – PPO | Admitting: Family Medicine

## 2010-09-15 VITALS — BP 110/72 | HR 74 | Temp 98.4°F | Ht 66.0 in | Wt 338.8 lb

## 2010-09-15 DIAGNOSIS — M545 Low back pain, unspecified: Secondary | ICD-10-CM

## 2010-09-15 DIAGNOSIS — M79605 Pain in left leg: Secondary | ICD-10-CM

## 2010-09-15 MED ORDER — NAPROXEN 500 MG PO TABS: 500.0000 mg | ORAL_TABLET | Freq: Two times a day (BID) | ORAL | Status: DC

## 2010-09-15 MED ORDER — TIZANIDINE HCL 4 MG PO TABS: 4.0000 mg | ORAL_TABLET | Freq: Every evening | ORAL | Status: AC

## 2010-09-15 NOTE — Progress Notes (Signed)
60 year old female:  Hip pain, about two weeks. Was out a lot last week. Out a lot last week.  Always in her left posterior buttocks.   Soemetimes will radiate down in her left.   This is worse than before.  No radiculopathy right now -- more located in the buttocks. Not lateral. No groin pain.  The above patient presents with back pain / lateral L buttocks pain that has been ongoing for approximately: 2 weeks The patient has had back pain before.  No numbness or tingling. No bowel or bladder incontinence. No focal weakness. Prior interventions: Physical therapy: No Chiropractic manipulations: no Acupuncture: No Osteopathic manipulation: No Heat or cold: Minimal effect  REVIEW OF SYSTEMS  GEN: No fevers, chills. Nontoxic. Primarily MSK c/o today. MSK: Detailed in the HPI GI: tolerating PO intake without difficulty Neuro: As above  Otherwise the pertinent positives of the ROS are noted above.    The PMH, PSH, Social History, Family History, Medications, and allergies have been reviewed in [facility], and have been updated if relevant.  Gen: Well-developed,well-nourished,in no acute distress; alert,appropriate and cooperative throughout examination HEENT: Normocephalic and atraumatic without obvious abnormalities.  Ears, externally no deformities Pulm: Breathing comfortably in no respiratory distress Range of motion at  the waist: Flexion: nml Extension: nml Rotation: nml  No echymosis or edema Rises to examination table with no difficulty Gait: minimally antalgic  Inspection/Deformity: N Paraspinus T: Minimally antalgic on the left Pain in the buttocks area postero-lateral  B Ankle Dorsiflexion (L5,4): 5/5 B Great Toe Dorsiflexion (L5,4): 5/5 Heel Walk (L5): WNL Toe Walk (S1): WNL Rise/Squat (L4): WNL, mild pain  Hip abduction: 4/5 Hipflexion 4/5 Compares to R of 5/5  SENSORY B Medial Foot (L4): WNL B Dorsum (L5): WNL B Lateral (S1): WNL Light Touch:  WNL Pinprick: WNL  REFLEXES Knee (L4): 2+ Ankle (S1): 2+  B SLR, seated: neg B SLR, supine: neg B FABER: neg B Reverse FABER: neg B Greater Troch: NT B Log Roll: neg B Sciatic Notch: NT Leg Lengths: equal    Assessment and plan: Lumbar back pain, may have a component of secondary bursitis in hip abductors Very weak Prior radiculopathy  I reviewed with the patient the structures involved and how they related to their diagnosis.   Start with medications, core rehab, and progress from there following low back pain algorithm.  No red flags are present.  Very weak - basic str program reviewed

## 2010-09-20 ENCOUNTER — Ambulatory Visit: Payer: BC Managed Care – PPO | Admitting: Family Medicine

## 2010-09-27 NOTE — Op Note (Signed)
[name], [name]              ACCOUNT NO.:  [id number]   MEDICAL RECORD NO.:  [id number]          PATIENT TYPE:  AMB   LOCATION:  DSC                          FACILITY:  [facility]   PHYSICIAN:  Robert A. Thurston Hole, M.D. DATE OF BIRTH:  12-31-1950   DATE OF PROCEDURE:  11/02/2008  DATE OF DISCHARGE:                               OPERATIVE REPORT   PREOPERATIVE DIAGNOSIS:  Left knee medial and lateral meniscal tears  with chondromalacia/degenerative joint disease and synovitis.   POSTOPERATIVE DIAGNOSIS:  Left knee medial and lateral meniscal tears  with chondromalacia/degenerative joint disease and synovitis.   PROCEDURES:  1. Left knee examination under anesthesia followed by orthoscopic      partial medial and lateral meniscectomies.  2. Left knee chondroplasty with partial synovectomy.   SURGEON:  Elana Alm. Thurston Hole, MD.   ASSISTANTJulien Girt, PA   ANESTHESIA:  General.   OPERATIVE TIME:  40 minutes.   COMPLICATIONS:  None.   INDICATION FOR PROCEDURE:  Ms. [name] is a 60 year old woman who has had  significant increasing left knee pain over the past 3 months, with exam  and MRI documenting meniscal tearing, chondromalacia, and synovitis.  She has failed conservative care and is now to undergo arthroscopy.   DESCRIPTION:  Ms. [name] was brought to the operating room on November 02, 2008, after a knee block had been placed in holding area by anesthesia.  She was placed on operating table in supine position.  After being  placed under general anesthesia, her left knee was examined.  She had  range of motion 0 to 120 degrees, 1-2+ crepitation.  Knee, stable  ligamentous exam with normal patellar tracking.  She received Ancef 2 g  IV preoperatively for prophylaxis.  The left leg was then prepped using  sterile DuraPrep and draped using sterile technique.  Originally,  through an anterolateral portal, the arthroscope with a pump attached  was placed and through an  anteromedial portal, an arthroscopic probe was  placed.  On initial inspection of the medial compartment, the articular  cartilage showed 75% grade 3 and 20% grade 4 chondromalacia and  degenerative joint disease, which was debrided.  Medial meniscus tear  posterior medial horn, of which 30-40% was resected back to a stable  rim.  Intercondylar notch inspected.  Anterior and posterior crucial  ligaments were normal.  Lateral compartment was inspected, 20% grade 3  and the rest grade 1 and 2 chondromalacia.  Lateral meniscus tear of 30%  posterolateral corner, which was resected back to a stable rim.  Patellofemoral joint showed 30% grade 3 chondromalacia on the patella  and femoral groove and this was debrided.  The patella tracked normally.  Moderate synovitis and medial and lateral gutters were debrided,  otherwise, these were free of pathology.  After this was done, it was  felt that all pathology have been satisfactorily addressed.  The  instruments were removed.  The portals were closed with 3-0 nylon  sutures.  Sterile dressings were applied.  The patient awakened and  taken to the recovery room in stable condition.   FOLLOWUP  CARE:  Ms. [name] will be followed as an outpatient on Norco  7.5.  She will be seen back in the office in a week for sutures out and  followup.      Robert A. Thurston Hole, M.D.  Electronically Signed     RAW/MEDQ  D:  11/02/2008  T:  11/03/2008  Job:  [id number]

## 2010-09-27 NOTE — Assessment & Plan Note (Signed)
[facility]                            CARDIOLOGY OFFICE NOTE   [name], [name]                     MRN:          [id number]  DATE:08/08/2007                            DOB:          July 08, 1950    [name] returns today after nearly a 3-year absence from the  practice.  We saw her initially for a family history of coronary disease  and multiple other risk factors including morbid obesity, lack of cardio  activity, remote tobacco, non-insulin dependent diabetes,  hyperlipidemia and family history.   She unfortunately has continued to not take great care of herself. She  recently had her daughter who is a nurse tell her she was out of  control needed a visit.   She currently is having no exertional angina or any exertional dyspnea  on exertion. She is very limited in what she can do however.  Much is  related to her weight.  She denies any orthopnea or PND, but has had  some peripheral edema, left leg worse than right.  She denies any chest  pain, particularly with taking a deep breath.  She has had no  hemoptysis.   CARDIAC RISK FACTORS:  Her cardiac risk factors are multiple and include  non-insulin dependent diabetes, hypertension, obesity, and  hyperlipidemia. All of these are being addressed by Dr. Hetty Ely. She  seems to be compliant with her medications.   PAST MEDICAL HISTORY:  She is intolerant of MYCIN DRUGS.   CURRENT MEDICATIONS:  .  1. Avandia 4 mg p.o. b.i.d.  2. Altace 10 mg a day.  3. Lipitor 40 mg a day.  4. Glyburide/metformin 1.25/250 one 1 daily.  5. Synthroid 300 mcg a day.  6. Cyanocobalamin 1000 mcg 1 injection monthly.  7. Maxzide 75/50 daily.  8. Lexapro 20 mg a day.  9. Alprazolam 0.25 p.r.n.   REVIEW OF SYSTEMS:  Totally negative.   FAMILY HISTORY:  Markedly positive for coronary disease and stroke.   PHYSICAL EXAMINATION:  Her blood pressure is 119/67, pulse is 82 and  regular.  Her EKG is normal.  She  is 5 feet 6 and weighs 330 pounds.  SKIN:  Warm and dry.  She is alert and oriented x3.  Her affect is very pleasant and quite  humorous.  HEENT:  Normocephalic, atraumatic.  PERRLA.  Extraocular movement is  intact.  Sclerae clear.  Facial symmetry is normal.  Carotids are equal  bilaterally without bruits, no JVD.  Thyroid is not enlarged.  Trachea  is midline.  LUNGS:  Clear with reduced breath sounds in the bases but no rales.  CARDIAC:  Her PMI is poorly appreciated. She has normal S1 and S2 was a  split S2.  ABDOMEN:  Obese, organomegaly could not be adequately assessed.  EXTREMITIES:  Reveal no cyanosis or clubbing but there is 1+ edema  bilaterally.  Pulses are 2+/4+, dorsalis pedis and posterior tibial.  No  sign of DVT.  No calf tenderness.  No sign of cellulitis.  SKIN:  Warm and dry otherwise unremarkable.  MUSCULOSKELETAL:  Some chronic arthritic changes.  ASSESSMENT/PLAN:  I had a long talk with [name] today.  She clearly  knows she is high risk for having an acute vascular event.  We talked  about this at length.  She does take her medications and she is on an  excellent medical program with good blood pressure control.  Her last  hemoglobin A1c she tells me was 6.1%.   At this point, I would recommend the following:  Because of some recent  chest pain that she had that took her a to the emergency room which I  did not have in the above HPI because I did not have that history, she  needs a functional study.  Most likely the best study for her with her  body habitus is a dobutamine echo. Will look into this.  We also have  asked her to try to lose some weight.  She may consider gastric bypass  which I would support.   She will continue her other medications and follow with Dr. Hetty Ely. I  have also asked her to try to at least ride a stationary bike or walk  three hours a week.  This would help in multiple respects.   Assuming that her stress test is negative,  I will see her back in a  year.     Thomas C. Daleen Squibb, MD, [facility]  Electronically Signed    TCW/MedQ  DD: 08/08/2007  DT: 08/08/2007  Job #: [id number]   cc:   Arta Silence, MD

## 2010-10-12 ENCOUNTER — Other Ambulatory Visit: Payer: Self-pay | Admitting: Family Medicine

## 2010-10-12 NOTE — Telephone Encounter (Signed)
rx called to pharmacy 

## 2010-10-13 ENCOUNTER — Telehealth: Payer: Self-pay | Admitting: Family Medicine

## 2010-10-13 ENCOUNTER — Ambulatory Visit (INDEPENDENT_AMBULATORY_CARE_PROVIDER_SITE_OTHER): Payer: BC Managed Care – PPO | Admitting: Family Medicine

## 2010-10-13 ENCOUNTER — Encounter: Payer: Self-pay | Admitting: Family Medicine

## 2010-10-13 ENCOUNTER — Ambulatory Visit (INDEPENDENT_AMBULATORY_CARE_PROVIDER_SITE_OTHER)
Admission: RE | Admit: 2010-10-13 | Discharge: 2010-10-13 | Disposition: A | Discharge status: Home / Self Care | Payer: BC Managed Care – PPO | Source: Ambulatory Visit | Attending: Family Medicine | Admitting: Family Medicine

## 2010-10-13 VITALS — BP 128/74 | HR 90 | Temp 98.2°F | Ht 66.0 in | Wt 335.1 lb

## 2010-10-13 DIAGNOSIS — R05 Cough: Secondary | ICD-10-CM

## 2010-10-13 DIAGNOSIS — R059 Cough, unspecified: Secondary | ICD-10-CM

## 2010-10-13 DIAGNOSIS — Z8701 Personal history of pneumonia (recurrent): Secondary | ICD-10-CM | POA: Insufficient documentation

## 2010-10-13 DIAGNOSIS — J189 Pneumonia, unspecified organism: Secondary | ICD-10-CM

## 2010-10-13 MED ORDER — ACETAMINOPHEN-CODEINE #3 300-30 MG PO TABS: 1.0000 | ORAL_TABLET | ORAL | Status: AC | PRN

## 2010-10-13 MED ORDER — MOXIFLOXACIN HCL 400 MG PO TABS: 400.0000 mg | ORAL_TABLET | Freq: Every day | ORAL | Status: AC

## 2010-10-13 NOTE — Patient Instructions (Signed)
We will treat you as lung infection with broadening antibiotics to avelox one pill daily for 7 days. Xray today - I will await read by radiologist. Push fluid, plenty of rest. Tylenol #3 for cough. If fever >101.5, worsening cough or trouble breathing, please return to be seen. May stop albuterol. Call us with questions.

## 2010-10-13 NOTE — Telephone Encounter (Signed)
Patient notified. Advised if not feeling any better after abx to call back. She verbalized understanding.

## 2010-10-13 NOTE — Assessment & Plan Note (Addendum)
Initial eval by [facility], treated with azithromycin, albuterol, tessalon. Initial low O2, rpt improved some but still not optimal. No h/o asthma, no wheezing, alb doesn't seem to be helping, so stop albuterol. Check CXR - concern for RUL PNA. Add on tylenol #3 for cough suppression.  Broaden abx to avelox for 7 days.  Update [place] if not improving as expected.

## 2010-10-13 NOTE — Telephone Encounter (Signed)
Please notify patient xray showing right sided pneumonia.  Take antibiotics as prescribed.  Recommend return in 2 months for repeat xray to ensure resolved.  Order in chart.

## 2010-10-13 NOTE — Progress Notes (Signed)
  Subjective:    Patient ID: [name], female    DOB: Sep 15, 1950, 60 y.o.   MRN: [id number]  HPI CC: f/u [facility]  2wk h/o feeling ill with cough.  Saturday went to walk in clinic, painful breathing.  Stated on zpack as well as tessalon perls, albuterol.  Finished zpack yesterday.  Not feeling better, coughing worse at night, dry cough, trouble breathing continued expecially with deep breaths.  Pain mid chest.  Doesn't think zpack took care of things.  Cough somewhat better but still with pain with deep breathing, coughing.  + chills in am.  + nausea.  Clear drainage.  + HA, sinus pressure worse on L side.  + ear pain but has improved.  Sinus pressure seems to be getting better.  No abd pain, vomiting.  No tooth pain.  No smokers at home, no sick contacts at home.  No h/o asthma, COPD.  No recent immobility.  Not on hormonal BC.   Review of Systems Per HPI    Objective:   Physical Exam  Nursing note and vitals reviewed. Constitutional: She appears well-developed and well-nourished. No distress.       uncomfortable from cough  HENT:  Head: Normocephalic and atraumatic.  Right Ear: Hearing, tympanic membrane, external ear and ear canal normal.  Left Ear: Hearing, tympanic membrane, external ear and ear canal normal.  Nose: Mucosal edema (L>R turbinate swelling) present. No rhinorrhea. Right sinus exhibits no maxillary sinus tenderness and no frontal sinus tenderness. Left sinus exhibits no maxillary sinus tenderness and no frontal sinus tenderness.  Mouth/Throat: Uvula is midline, oropharynx is clear and moist and mucous membranes are normal. No oropharyngeal exudate.  Eyes: Conjunctivae and EOM are normal. Pupils are equal, round, and reactive to light. No scleral icterus.  Neck: Normal range of motion. Neck supple. No thyromegaly present.  Cardiovascular: Normal rate, regular rhythm, normal heart sounds and intact distal pulses.   No murmur heard. Pulmonary/Chest: Effort normal  and breath sounds normal. No respiratory distress. She has no wheezes. She has no rales.  Lymphadenopathy:    She has no cervical adenopathy.  Skin: Skin is warm and dry. No rash noted.   Anticipate initial low O2 reading 2/2 acrylic nails.  On recheck O2 sat ranging from 89-97% RA       Assessment & Plan:

## 2010-10-14 DIAGNOSIS — D179 Benign lipomatous neoplasm, unspecified: Secondary | ICD-10-CM

## 2010-10-14 HISTORY — DX: Benign lipomatous neoplasm, unspecified: D17.9

## 2010-10-16 ENCOUNTER — Emergency Department (HOSPITAL_COMMUNITY): Payer: BC Managed Care – PPO

## 2010-10-16 ENCOUNTER — Inpatient Hospital Stay (HOSPITAL_COMMUNITY)
Admission: EM | Admit: 2010-10-16 | Discharge: 2010-10-18 | DRG: 541 | Disposition: A | Discharge status: Home / Self Care | Payer: BC Managed Care – PPO | Attending: Internal Medicine | Admitting: Internal Medicine

## 2010-10-16 DIAGNOSIS — D3500 Benign neoplasm of unspecified adrenal gland: Secondary | ICD-10-CM | POA: Diagnosis present

## 2010-10-16 DIAGNOSIS — J189 Pneumonia, unspecified organism: Principal | ICD-10-CM | POA: Diagnosis present

## 2010-10-16 DIAGNOSIS — E119 Type 2 diabetes mellitus without complications: Secondary | ICD-10-CM | POA: Diagnosis present

## 2010-10-16 DIAGNOSIS — F411 Generalized anxiety disorder: Secondary | ICD-10-CM | POA: Diagnosis present

## 2010-10-16 DIAGNOSIS — I10 Essential (primary) hypertension: Secondary | ICD-10-CM | POA: Diagnosis present

## 2010-10-16 DIAGNOSIS — J9600 Acute respiratory failure, unspecified whether with hypoxia or hypercapnia: Secondary | ICD-10-CM | POA: Diagnosis present

## 2010-10-16 DIAGNOSIS — G4733 Obstructive sleep apnea (adult) (pediatric): Secondary | ICD-10-CM | POA: Diagnosis present

## 2010-10-16 DIAGNOSIS — I2510 Atherosclerotic heart disease of native coronary artery without angina pectoris: Secondary | ICD-10-CM | POA: Diagnosis present

## 2010-10-16 DIAGNOSIS — E039 Hypothyroidism, unspecified: Secondary | ICD-10-CM | POA: Diagnosis present

## 2010-10-16 DIAGNOSIS — E785 Hyperlipidemia, unspecified: Secondary | ICD-10-CM | POA: Diagnosis present

## 2010-10-16 LAB — CBC
HCT: 36.7 % (ref 36.0–46.0)
Hemoglobin: 12 g/dL (ref 12.0–15.0)
MCH: 28.4 pg (ref 26.0–34.0)
MCHC: 32.7 g/dL (ref 30.0–36.0)
MCV: 86.8 fL (ref 78.0–100.0)
Platelets: 278 K/uL (ref 150–400)
RBC: 4.23 MIL/uL (ref 3.87–5.11)
RDW: 13.9 % (ref 11.5–15.5)
WBC: 10.1 10*3/uL (ref 4.0–10.5)

## 2010-10-16 LAB — DIFFERENTIAL
Basophils Absolute: 0 K/uL (ref 0.0–0.1)
Basophils Relative: 0 % (ref 0–1)
Eosinophils Absolute: 0.2 K/uL (ref 0.0–0.7)
Eosinophils Relative: 2 % (ref 0–5)
Lymphocytes Relative: 10 % — ABNORMAL LOW (ref 12–46)
Lymphs Abs: 1 10*3/uL (ref 0.7–4.0)
Monocytes Absolute: 0.6 K/uL (ref 0.1–1.0)
Monocytes Relative: 6 % (ref 3–12)
Neutro Abs: 8.3 K/uL — ABNORMAL HIGH (ref 1.7–7.7)
Neutrophils Relative %: 82 % — ABNORMAL HIGH (ref 43–77)

## 2010-10-16 LAB — BASIC METABOLIC PANEL
BUN: 24 mg/dL — ABNORMAL HIGH (ref 6–23)
CO2: 27 mEq/L (ref 19–32)
Chloride: 94 mEq/L — ABNORMAL LOW (ref 96–112)
Creatinine, Ser: 0.93 mg/dL (ref 0.4–1.2)
Glucose, Bld: 117 mg/dL — ABNORMAL HIGH (ref 70–99)
Potassium: 3.4 mEq/L — ABNORMAL LOW (ref 3.5–5.1)

## 2010-10-16 LAB — BASIC METABOLIC PANEL WITH GFR
Calcium: 8.8 mg/dL (ref 8.4–10.5)
GFR calc Af Amer: >60 mL/min (ref >60)
GFR calc non Af Amer: >60 mL/min (ref >60)
Sodium: 135 meq/L (ref 135–145)

## 2010-10-16 LAB — GLUCOSE, CAPILLARY: Glucose-Capillary: 90 mg/dL (ref 70–99)

## 2010-10-16 LAB — MRSA PCR SCREENING: MRSA by PCR: NEGATIVE

## 2010-10-16 MED ORDER — IOHEXOL 300 MG/ML  SOLN
80.0000 mL | Freq: Once | INTRAMUSCULAR | Status: AC | PRN
  Administered 2010-10-16: 80 mL via INTRAVENOUS

## 2010-10-17 ENCOUNTER — Telehealth: Payer: Self-pay | Admitting: *Deleted

## 2010-10-17 LAB — GLUCOSE, CAPILLARY
Glucose-Capillary: 158 mg/dL — ABNORMAL HIGH (ref 70–99)
Glucose-Capillary: 98 mg/dL (ref 70–99)
Glucose-Capillary: 105 mg/dL — ABNORMAL HIGH (ref 70–99)

## 2010-10-17 LAB — HEMOGLOBIN A1C: Hgb A1c MFr Bld: 6.1 % — ABNORMAL HIGH (ref <5.7)

## 2010-10-17 NOTE — Telephone Encounter (Signed)
Triage Record Num: [id number] Operator: Karenann Cai Patient Name: Shaquinta Peruski Call Date & Time: 10/16/2010 9:41:15AM Patient Phone: [phone] PCP: Kerby Nora Patient Gender: Female PCP Fax : [phone] Patient DOB: March 29, 1951 Practice Name: [facility] Reason for Call: Patient is calling to report that she is experiencing shortness of breath with exertion. Diagnosed with pneumonia on 10/13/2010. Patient "feels worse". Cough: constant and cannot "cough up" sputum. Fever: intermittently. RN advised patient to be evaluated at ER now. Patient is calling family member to drive her to ER. Protocol(s) Used: Breathing Problems Protocol(s) Used: Diabetes: Respiratory Problems Recommended Outcome per Protocol: See ED Immediately Reason for Outcome: Diabetes and breathing problems New or worsening breathing problems Care Advice: ~ Another adult should drive. 06/

## 2010-10-18 LAB — BASIC METABOLIC PANEL
CO2: 30 mEq/L (ref 19–32)
Chloride: 97 mEq/L (ref 96–112)
GFR calc Af Amer: >60 mL/min (ref >60)
Sodium: 137 mEq/L (ref 135–145)

## 2010-10-18 LAB — CBC
HCT: 36.1 % (ref 36.0–46.0)
Hemoglobin: 11.8 g/dL — ABNORMAL LOW (ref 12.0–15.0)
WBC: 7.5 10*3/uL (ref 4.0–10.5)

## 2010-10-18 LAB — GLUCOSE, CAPILLARY
Glucose-Capillary: 118 mg/dL — ABNORMAL HIGH (ref 70–99)
Glucose-Capillary: 119 mg/dL — ABNORMAL HIGH (ref 70–99)

## 2010-10-19 NOTE — Discharge Summary (Signed)
[name], [name] NO.:  [id number]  MEDICAL RECORD NO.:  [id number]  LOCATION:  1333                         FACILITY:  [facility]  PHYSICIAN:  Andreas Blower, MD       DATE OF BIRTH:  1951-02-21  DATE OF ADMISSION:  10/16/2010 DATE OF DISCHARGE:  10/18/2010                              DISCHARGE SUMMARY   PRIMARY CARE PHYSICIAN:  Kerby Nora, MD  CARDIOLOGIST:  Jesse Sans. Wall, MD, [facility]  DISCHARGE DIAGNOSES: 1. Multifocal pneumonia. 2. Hypoxia secondary to pneumonia. 3. Obstructive sleep apnea. 4. Type 2 diabetes. 5. Hypertension. 6. Hypothyroidism. 7. Morbid obesity. 8. History of nonobstructive coronary disease. 9. History of hyperlipidemia. 10.History of anxiety. 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  DISCHARGE MEDICATIONS: 1. Zofran 4 mg every 6 hours as needed for nausea. 2. Phenergan 12.5 mg every 6 hours as needed for nausea. 3. Pioglitazone 30 mg p.o. daily. 4. Aspirin 81 mg p.o. daily. 5. Vitamin B12 injection IM every month. 6. Vitamin D2, 50,000 units, 1 capsule p.o. every Wednesday. 7. Furosemide 20 mg daily as needed for swelling. 8. Glyburide/metformin 1.25/25 mg 1 tablet p.o. q.a.m. 9. Atorvastatin 40 mg daily at bedtime. 10.Triamterene/hydrochlorothiazide 75/50, 1 tablet p.o. daily. 11.Moxifloxacin 400 mg p.o. daily 12.Naproxen 500 mg twice daily as needed for pain. 13.Nystatin one application daily as needed. 14.Omeprazole 40 twice daily. 15.Sertraline 200 mg p.o. daily. 16.Synthroid 200 mcg p.o. daily 17.Tylenol with Codeine #3, 1 tablet every 4 hours as needed for pain. 18.Alprazolam 0.5 mg 3 times a day as needed for anxiety. 19.Alprazolam extended release 0.5 mg p.o. daily.  BRIEF ADMITTING HISTORY AND PHYSICAL:  [name] is a 60 year old Caucasian female with multiple medical conditions presented to the ER on October 16, 2010, with complaints of worsening shortness of breath after the patient initially had  an upper respiratory infection, which then progressed.  RADIOLOGY/IMAGING: 1. The patient had chest x-ray, 2-view, which shows expected evolution     of right upper and right lower lobe air space disease compatible     with pneumonia. 2. The patient had a CT of the chest with contrast on October 16, 2010,     which showed multifocal air space disease consistent with     multifocal pneumonia.  Partially visualized 43 mm left adrenal     lesion, likely representing adrenal myelolipoma.  LABORATORY DATA:  CBC shows white count of 7.5, hemoglobin 11.8, hematocrit 36.1, platelet count 258.  Electrolytes normal with BUN of 22, creatinine 0.92, hemoglobin A1c 6.1.  MRSA by PCR was negative.  HOSPITAL COURSE BY PROBLEMS: 1. Multifocal pneumonia.  The patient had initially a chest x-ray     done, CT as indicated above.  Initially, the patient was started on     vancomycin and levofloxacin, prior to admission, the patient had     been taking 3 days of moxifloxacin.  During the course of hospital     stay, her antibiotic was transitioned back to moxifloxacin, which     she will take for 3 more days to complete at least 8-day course of     antibiotics.  CT was negative for PE. 2. Hypoxia maybe due  to multifocal pneumonia.  I suspect that the     patient also has obstructive sleep apnea, which may be causing     hypoxia as during the night, the patient had episodes of     desaturation. 3. Obstructive sleep apnea.  Encouraged the patient to use CPAP as     outpatient. 4. Type 2 diabetes.  Continue the patient on home medications. 5. Hypertension, stable.  Continue the patient on home medications. 6. Hypothyroidism.  Continue the patient on home medications. 7. Morbid obesity, will need diet and exercise as an outpatient. 8. Hyperlipidemia, stable. 9. History of anxiety.  Continued home dose of Xanax. 10.43 mm left adrenal lesion, likely representing adrenal     myelolipoma. The patient was  instructed to follow up with her     primary care physician to perhaps consider getting an adrenal MRI     for further evaluation as outpatient.  Time spent on discharge talking to the patient, family, and coordinating care was 35 minutes.   Andreas Blower, MD   SR/MEDQ  D:  10/18/2010  T:  10/19/2010  Job:  [id number]  cc:   Kerby Nora, MD  Electronically Signed by Wardell Heath Vinaya Sancho  on 10/19/2010 08:58:19 AM

## 2010-10-24 ENCOUNTER — Encounter: Payer: Self-pay | Admitting: Family Medicine

## 2010-10-24 ENCOUNTER — Telehealth: Payer: Self-pay | Admitting: Family Medicine

## 2010-10-24 ENCOUNTER — Ambulatory Visit (INDEPENDENT_AMBULATORY_CARE_PROVIDER_SITE_OTHER): Payer: BC Managed Care – PPO | Admitting: Family Medicine

## 2010-10-24 VITALS — BP 118/76 | HR 74 | Temp 98.2°F | Wt 331.0 lb

## 2010-10-24 DIAGNOSIS — E278 Other specified disorders of adrenal gland: Secondary | ICD-10-CM

## 2010-10-24 DIAGNOSIS — E279 Disorder of adrenal gland, unspecified: Secondary | ICD-10-CM

## 2010-10-24 DIAGNOSIS — J189 Pneumonia, unspecified organism: Secondary | ICD-10-CM

## 2010-10-24 MED ORDER — GLYBURIDE-METFORMIN 1.25-250 MG PO TABS: 1.0000 | ORAL_TABLET | Freq: Every day | ORAL | Status: DC

## 2010-10-24 MED ORDER — LEVOTHYROXINE SODIUM 200 MCG PO TABS: 200.0000 ug | ORAL_TABLET | Freq: Every day | ORAL | Status: DC

## 2010-10-24 NOTE — Progress Notes (Signed)
  Subjective:    Patient ID: [name], female    DOB: [birth year], 60 y.o.   MRN: [id number]  HPI CC: f/u hospitalization for PNA  Seen 11 days ago with dx left sided PNA even after zpack started at [facility].  Started on avelox and didn't improve so over last weekend went to ER, hospitalized with multifocal PNA and hypoxia.  Given IV abx in hospital for first 3 days then transitioned back to avelox.  D/C 10/19/2010.  Summary reviewed.  Sent home on moxiflox.  Completed course already.     CT scan showing incidental left adrenal mass possible myelolipoma, rec f/u adrenal MRI.  Sleeping in recliner.  Yesterday good day.  Last night slept ok, this am again feeling congested again.  No fevers, slowly improving cough.  O2 a bit low at 93% but improving compared to 70s in hospital off O2.  Noted desats at night while in hospital.  Had recently been dx with OSA, had just started CPAP prior to developing PNA.  rec to use CPAP nightly.  Has been on STD since march for depression, seeing psychiatry.  Followed by PCP for this.  Review of Systems per HPI   Objective:   Physical Exam  Nursing note and vitals reviewed. Constitutional: She appears well-developed and well-nourished. No distress.  HENT:  Head: Normocephalic and atraumatic.  Mouth/Throat: Oropharynx is clear and moist. No oropharyngeal exudate.  Eyes: Conjunctivae and EOM are normal. Pupils are equal, round, and reactive to light. No scleral icterus.  Neck: Normal range of motion. Neck supple.  Cardiovascular: Normal rate, regular rhythm, normal heart sounds and intact distal pulses.   No murmur heard. Pulmonary/Chest: Effort normal and breath sounds normal. No respiratory distress. She has no wheezes. She has no rales.  Lymphadenopathy:    She has no cervical adenopathy.  Skin: Skin is warm and dry. No rash noted.  Psychiatric: She has a normal mood and affect.          Assessment & Plan:

## 2010-10-24 NOTE — Telephone Encounter (Signed)
Message copied by Excell Seltzer on Mon Oct 24, 2010  5:26 PM ------      Message from: Eustaquio Boyden      Created: Mon Oct 24, 2010  1:43 PM       Pt asking to switch over to me.      Trouble getting in to see Dr. Garen Grams by you?

## 2010-10-24 NOTE — Telephone Encounter (Signed)
Okay with me 

## 2010-10-24 NOTE — Assessment & Plan Note (Signed)
Multifocal PNA with hypoxia.  S/p avelox and IV abx in hospital (vanc/zosyn). Slowly recuperating. Anticipate slow but steady recovery over next several weeks. Pt brings STD paperwork to fill out, did and provided with copy of OV as well as D/C summary, faxed to them. Discussed deep breathing, slowly increasing activity as tolerated, push fluids. Update [place] if questions or return if red flags. Pt desires to establish with me, return in next few months to establish.

## 2010-10-24 NOTE — Assessment & Plan Note (Addendum)
Found incidentally on CT, thought to be myelolipoma.  rec MRI to further evaluate. Will set up with MRI abd with contrast to eval 43 mm adrenal mass when returns to establish with me.

## 2010-10-24 NOTE — Patient Instructions (Signed)
Ensure getting good breaths throughout the day. Slowly increase activity. Push fluids and rest for next few days. Return in the next month to establish with me.

## 2010-11-15 ENCOUNTER — Other Ambulatory Visit: Payer: Self-pay | Admitting: *Deleted

## 2010-11-15 MED ORDER — ATORVASTATIN CALCIUM 40 MG PO TABS: 40.0000 mg | ORAL_TABLET | Freq: Every day | ORAL | Status: DC

## 2010-11-18 ENCOUNTER — Encounter: Payer: Self-pay | Admitting: Cardiology

## 2010-11-21 ENCOUNTER — Ambulatory Visit: Payer: Self-pay | Admitting: Cardiology

## 2010-11-28 NOTE — H&P (Signed)
[name], [name]              ACCOUNT NO.:  [id number]  MEDICAL RECORD NO.:  [id number]           PATIENT TYPE:  E  LOCATION:  WLED                         FACILITY:  [facility]  PHYSICIAN:  Altha Harm, MDDATE OF BIRTH:  18-Sep-1950  DATE OF ADMISSION:  10/16/2010 DATE OF DISCHARGE:                             HISTORY & PHYSICAL   PRIMARY CARE PHYSICIAN:  Kerby Nora, MD  CARDIOLOGIST:  Valera Castle, MD  CHIEF COMPLAINT:  Worsening shortness of breath.  HISTORY OF PRESENT ILLNESS:  [name] is a very pleasant 60 year old morbidly obese female who states approximately 2 weeks ago she started having symptoms of an upper respiratory infection.  The patient states that she tried to treat with over-the-counter medications without success.  The patient then went to an urgent care, was treated with azithromycin, which she completed.  She states that her symptoms persisted and she went to see her primary care physician who did an x- ray and called her stating that she had a pneumonia and prescribed Avelox for this.  The patient states that she has taken Avelox for a total of 3 days, however, her symptoms have progressed.  She has got no better and is still continuing to have significant chills.  The patient also states although she did not have a measured fever, she had a tactile fever and has had a cough that has been preventing her from sleeping at night.  She also states that she has pain in her anterior chest wall whenever she takes a deep breath.  The patient has had no weight loss or weight gain.  No diarrhea.  No nausea or vomiting.  We are asked to see the patient for further evaluation and management.  PAST MEDICAL HISTORY:  Significant for: 1. Diabetes type 2. 2. Hypothyroidism. 3. Hypertension. The patient states that she is followed by her cardiologist, but does not appear to have any active cardiac problems but rather a family history of cardiac  problems.  FAMILY HISTORY:  Includes coronary artery disease in both parents with cardiac events above the age of 54.  SOCIAL HISTORY:  The patient is currently on short-term disability. 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  She denies any tobacco, alcohol or drug use.  MEDICATIONS:  The patient is on multiple medications which are not available to me right now.  I will review them with the pharmacy tech.  ALLERGIES:  E-MYCIN.  Please note it is not erythromycin.  REVIEW OF SYSTEMS:  All other systems negative.  PHYSICAL EXAMINATION:  GENERAL:  This is a morbidly obese patient sitting at the bed with no acute distress. VITAL SIGNS:  Temperature is 98, heart rate is 79, respiratory rate 23, blood pressure 128/65, O2 saturation is 87% on room air, and 95% on 3 L. HEENT:  Normocephalic, atraumatic.  Pupils equally round and reactive to light and accommodation.  Extraocular movements are intact.  Oropharynx is moist.  No exudates, erythema or lesions are noted. NECK:  Trachea is midline.  No masses, no thyromegaly, no JVD, no carotid bruit. RESPIRATORY:  The patient is mildly tachypneic.  No accessory muscle use.  No wheezing or rhonchi noted but there is decreased air entry throughout the lung fields. CARDIOVASCULAR:  Normal S1 and S2.  I cannot appreciate any murmurs, rubs or gallops. ABDOMEN:  Obese, soft, nontender, nondistended.  No masses, no hepatosplenomegaly noted. NEUROLOGIC:  No focal neurological deficits.  Cranial nerves II-XII are grossly intact. PSYCHIATRIC:  Alert and oriented x3, good insight and cognition, good recent and remote recall.  ASSESSMENT AND PLAN:  This is a patient who presents with acute hypoxic respiratory failure secondary to what appears to be a multifocal pneumonia.  The patient has failed outpatient therapy with two courses of antibiotics.  Owing to this, I will go ahead and place the patient  on vancomycin and Levaquin.  I think it is prudent at this time to get a CT scan of the chest with contrast to see whether or not there are any other processes that are current that may be missed in here.  The patient started off with respiratory symptoms and was not hypoxic at the time.  I am not suspecting a pulmonary embolus in this patient, thus I will not pursue that.  I will wait for the results of the CT scan and make decisions thereafter.  The patient will be supported with oxygen and she will be given deep venous thrombosis prophylaxis with Lovenox. Will check a TSH on the patient and check a hemoglobin A1c.  I will review the patient's home medications and resume them at this time.  The patient may have a component of pulmonary edema which should be able to be seen on the CT scan.  Given her body habitus, the chest x-ray is somewhat limited in its usefulness in this regard.  Further therapy will be determined by initial testing, clinical course.  Please note the patient is being admitted to [facility] team #5.     Altha Harm, MD     MAM/MEDQ  D:  10/16/2010  T:  10/16/2010  Job:  [id number]  Electronically Signed by Marthann Schiller MD on 11/28/2010 05:59:35 PM

## 2010-11-29 ENCOUNTER — Other Ambulatory Visit: Payer: Self-pay | Admitting: *Deleted

## 2010-11-29 MED ORDER — OMEPRAZOLE 40 MG PO CPDR: 40.0000 mg | DELAYED_RELEASE_CAPSULE | Freq: Two times a day (BID) | ORAL | Status: DC

## 2010-12-01 ENCOUNTER — Other Ambulatory Visit: Payer: Self-pay | Admitting: Family Medicine

## 2010-12-02 ENCOUNTER — Other Ambulatory Visit (INDEPENDENT_AMBULATORY_CARE_PROVIDER_SITE_OTHER): Payer: BC Managed Care – PPO | Admitting: Family Medicine

## 2010-12-02 DIAGNOSIS — E119 Type 2 diabetes mellitus without complications: Secondary | ICD-10-CM

## 2010-12-02 DIAGNOSIS — E559 Vitamin D deficiency, unspecified: Secondary | ICD-10-CM

## 2010-12-06 ENCOUNTER — Telehealth: Payer: Self-pay | Admitting: *Deleted

## 2010-12-06 NOTE — Telephone Encounter (Signed)
Form for diabetic supplies is on your desk.  I checked with the patient and she does want these supplies. 

## 2010-12-07 NOTE — Telephone Encounter (Signed)
Filled and placed in my out box. 

## 2010-12-09 ENCOUNTER — Ambulatory Visit (INDEPENDENT_AMBULATORY_CARE_PROVIDER_SITE_OTHER)
Admission: RE | Admit: 2010-12-09 | Discharge: 2010-12-09 | Disposition: A | Discharge status: Home / Self Care | Payer: BC Managed Care – PPO | Source: Ambulatory Visit | Attending: Family Medicine | Admitting: Family Medicine

## 2010-12-09 ENCOUNTER — Ambulatory Visit (INDEPENDENT_AMBULATORY_CARE_PROVIDER_SITE_OTHER): Payer: BC Managed Care – PPO | Admitting: Family Medicine

## 2010-12-09 ENCOUNTER — Encounter: Payer: Self-pay | Admitting: Family Medicine

## 2010-12-09 ENCOUNTER — Ambulatory Visit: Payer: BC Managed Care – PPO | Admitting: Family Medicine

## 2010-12-09 VITALS — BP 116/70 | HR 68 | Temp 97.9°F | Wt 329.0 lb

## 2010-12-09 DIAGNOSIS — J3489 Other specified disorders of nose and nasal sinuses: Secondary | ICD-10-CM

## 2010-12-09 DIAGNOSIS — E559 Vitamin D deficiency, unspecified: Secondary | ICD-10-CM

## 2010-12-09 DIAGNOSIS — E039 Hypothyroidism, unspecified: Secondary | ICD-10-CM

## 2010-12-09 DIAGNOSIS — F3289 Other specified depressive episodes: Secondary | ICD-10-CM

## 2010-12-09 DIAGNOSIS — E785 Hyperlipidemia, unspecified: Secondary | ICD-10-CM

## 2010-12-09 DIAGNOSIS — F329 Major depressive disorder, single episode, unspecified: Secondary | ICD-10-CM

## 2010-12-09 DIAGNOSIS — E279 Disorder of adrenal gland, unspecified: Secondary | ICD-10-CM

## 2010-12-09 DIAGNOSIS — E119 Type 2 diabetes mellitus without complications: Secondary | ICD-10-CM

## 2010-12-09 DIAGNOSIS — J189 Pneumonia, unspecified organism: Secondary | ICD-10-CM

## 2010-12-09 DIAGNOSIS — R0981 Nasal congestion: Secondary | ICD-10-CM | POA: Insufficient documentation

## 2010-12-09 DIAGNOSIS — E278 Other specified disorders of adrenal gland: Secondary | ICD-10-CM

## 2010-12-09 MED ORDER — VITAMIN D3 250 MCG (10000 UT) PO CAPS: 10000.0000 [IU] | ORAL_CAPSULE | ORAL | Status: DC

## 2010-12-09 NOTE — Assessment & Plan Note (Signed)
Lab Results  Component Value Date   LDLCALC 77 08/17/2010  stable on lipitor, continue med. Wonders if causing amnesia vs other psychotropic meds she's on.

## 2010-12-09 NOTE — Assessment & Plan Note (Signed)
Followed by Dr. Chen. 

## 2010-12-09 NOTE — Assessment & Plan Note (Signed)
Well controlled. Continue meds. Lab Results  Component Value Date   HGBA1C 6.1 12/02/2010  RTC 3 mo.

## 2010-12-09 NOTE — Assessment & Plan Note (Signed)
Stop weekly 50k units. Start daily 1k or weekly 10k u replacement.  Pt prefers weekly, will check OTC preps and either start this or start daily supplement.

## 2010-12-09 NOTE — Assessment & Plan Note (Addendum)
Start with antihistamine, nasal saline irrigation. If not better increase regimen to INS.

## 2010-12-09 NOTE — Patient Instructions (Addendum)
Vitamin D 45409 units weekly or 1000 units daily. For sinuses - start nasal saline irrigation 2-3 times daily as well as over the counter claritin or zyrtec. Good job with A1c and blood pressure. We will set you up with abdominal MRI next month around 8/15 or 8/20 to check on adrenal spot.  Pass by Marion's office to set that up.

## 2010-12-09 NOTE — Assessment & Plan Note (Signed)
Obtain adrenal MRI next month to follow incidental 43mm mass found on CT 10/2010.

## 2010-12-09 NOTE — Progress Notes (Signed)
  Subjective:    Patient ID: [name], female    DOB: 1951-03-11, 60 y.o.   MRN: [id number]  HPI CC: 88mo f/u  DM - doesn't check sugars.  A1c has always been well controlled.  On glucovance and actos.  Will consider checking sugars to monitor.  Last foot exam was 3 mo ago.  Last vision screen was 6 mo ago.  No paresthesias, sometimes feel stiff.  Knows diabetic diet.  No low sugars.  Knows hypoglycemic sxs, has plan.  HTN - well controlled on current meds.  Lately when bending over feels dizzy.  Attributed to psychotropic meds.  No HA, vision changes, CP/tightness, SOB, leg swelling.  HLD - compliant with lipitor 40mg  nightly.  No myalgias.  Vit D - 41, will change to daily or weekly dosing.  Feels depression/anxiety doing much better.  On zoloft 200mg  and xanax xr qam and short acting as needed.  Was placed on wellbutrin by Dr. Imogene Burn, stopped because felt was on too many psych meds.  Stemmed from job loss 07/2010.  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 rec MRI to further evaluate.  Will set up with MRI abd with contrast to eval 43 mm adrenal mass. No abd pain, n/v/f/c/d/c.  Allergies acting up - more congestion, RN.  Hasn't tried anything yet.    Preventative: Well woman from [facility]. Unsure last CPE.  Review of Systems Per HPI    Objective:   Physical Exam  Nursing note and vitals reviewed. Constitutional: She appears well-developed and well-nourished. No distress.  HENT:  Head: Normocephalic and atraumatic.  Right Ear: Hearing, tympanic membrane, external ear and ear canal normal.  Left Ear: Hearing, tympanic membrane, external ear and ear canal normal.  Nose: Nose normal. No mucosal edema or rhinorrhea.  Mouth/Throat: Uvula is midline, oropharynx is clear and moist and mucous membranes are normal. No oropharyngeal exudate, posterior oropharyngeal edema, posterior oropharyngeal erythema or  tonsillar abscesses.  Eyes: Conjunctivae and EOM are normal. Pupils are equal, round, and reactive to light. No scleral icterus.  Neck: Normal range of motion. Neck supple. Carotid bruit is not present.  Cardiovascular: Normal rate, regular rhythm, normal heart sounds and intact distal pulses.   No murmur heard. Pulmonary/Chest: Effort normal and breath sounds normal. No respiratory distress. She has no wheezes. She has no rales.  Musculoskeletal: She exhibits no edema.       Diabetic foot exam: Normal inspection No skin breakdown No calluses  Normal DP/PT pulses Normal sensation to light tough and decreased to monofilament bilateral toes Nails normal  Lymphadenopathy:    She has no cervical adenopathy.  Skin: Skin is warm and dry. No rash noted.  Psychiatric: She has a normal mood and affect.          Assessment & Plan:   No problem-specific assessment & plan notes found for this encounter.   ADDENDUM - rapid strep placed on wrong patient.  She did not have swab done.

## 2010-12-09 NOTE — Assessment & Plan Note (Signed)
S/p multifocal PNA 10/2010.  Improving.  CXR showing cleared PNA.

## 2010-12-09 NOTE — Assessment & Plan Note (Signed)
Lab Results  Component Value Date   TSH 0.944 10/17/2010

## 2010-12-20 ENCOUNTER — Ambulatory Visit: Payer: BC Managed Care – PPO | Admitting: Family Medicine

## 2011-01-02 ENCOUNTER — Other Ambulatory Visit (INDEPENDENT_AMBULATORY_CARE_PROVIDER_SITE_OTHER): Payer: BC Managed Care – PPO | Admitting: Family Medicine

## 2011-01-02 DIAGNOSIS — R1013 Epigastric pain: Secondary | ICD-10-CM

## 2011-01-09 ENCOUNTER — Other Ambulatory Visit: Payer: BC Managed Care – PPO

## 2011-01-14 ENCOUNTER — Ambulatory Visit
Admission: RE | Admit: 2011-01-14 | Discharge: 2011-01-14 | Disposition: A | Discharge status: Home / Self Care | Payer: BC Managed Care – PPO | Source: Ambulatory Visit | Attending: Family Medicine | Admitting: Family Medicine

## 2011-01-14 ENCOUNTER — Other Ambulatory Visit: Payer: Self-pay | Admitting: Family Medicine

## 2011-01-14 DIAGNOSIS — E278 Other specified disorders of adrenal gland: Secondary | ICD-10-CM

## 2011-01-16 HISTORY — PX: MR ABDOMEN: HXRAD300

## 2011-01-17 ENCOUNTER — Encounter: Payer: Self-pay | Admitting: Family Medicine

## 2011-02-03 LAB — I-STAT 8, (EC8 V) (CONVERTED LAB)
BUN: 20
Bicarbonate: 29.2 — ABNORMAL HIGH
HCT: 40
Hemoglobin: 13.6
Operator id: 265201
Potassium: 3.2 — ABNORMAL LOW
Sodium: 137
TCO2: 31

## 2011-02-03 LAB — POCT CARDIAC MARKERS
CKMB, poc: 1.2
Myoglobin, poc: 46
Troponin i, poc: <0.05

## 2011-02-09 ENCOUNTER — Telehealth: Payer: Self-pay | Admitting: *Deleted

## 2011-02-09 NOTE — Telephone Encounter (Signed)
Noted. Thanks. Pt on zoloft 200mg  daily as well as xanax and trazodone, followed by psychiatrist Dr. Imogene Burn. I think I did address this at my last note with pt - I think we did discuss her mood and how she was feeling overall improved.

## 2011-02-09 NOTE — Telephone Encounter (Signed)
Patient called and is trying to apply for LTD coverage. She asked that I check to see if she had ever been diagnosed with depression because the disability claims were saying that it was a pre-existing condition and they are trying not to approve it. I checked back as far as I could in the EMR and it looks like it was on her history back in 2008. She said she has never been depressed and has never been told she had depression from anyone here. She wanted to know why it would be in her chart if it wasn't ever told to her. She asked what to do because she says she has never had a problem with depression, only anxiety and she won't be able to get her disabilty. The discrepancy is going back a very long time. She asked if she could talk to Dr. Hetty Ely about it. I told her that I honestly didn't know what she needed to do, but that I would find out and call her back. She understood that it may take some time to get things straightened out. I checked for her paper chart and apparently it has already been sent out to send info to LTD claims. We're in the process of trying to get it back so I can see exactly when she was diagnosed and under what circumstances.  I just wanted you both to be aware of this in case there are any questions, etc.

## 2011-02-10 NOTE — Telephone Encounter (Signed)
Sending to clinical coordinator for review and reply to me re: follow up on documentation on chart and patient's concern that goes back to 2008.

## 2011-02-13 NOTE — Telephone Encounter (Signed)
Phyllis-it probably goes back further than 2008, however her paper chart had been sent out to [employer] for medical records to be sent to the LTD, so I couldn't check that. The paper chart needs to be checked for the dates prior to what we have in EMR. Just wanted to make sure it was understood as to what the patient was asking for.   Thanks!

## 2011-02-13 NOTE — Telephone Encounter (Signed)
Following up with [facility] for [employer] involvement and for appropriate follow up.  Thanks

## 2011-02-14 NOTE — Telephone Encounter (Signed)
FYI- Dr. Reece Agar. See below. We are investigating the dx with medical records.

## 2011-02-15 NOTE — Telephone Encounter (Signed)
Pt returned my call and I spoke with patient regarding her questions.  The patient was questioning how information was obtained at New Patient visit between Oct 1 - Dec 31st for past year if it was not discussed during New Patient visit.  I explained to patient that the chart information is documentation that follows the patient through all visits and providers for [place] and would have been on the EMR chart due to her past medical history and documentation. She verbalized understanding of how the information was on her chart and followed EMR and said that she did not want to appeal the information on the chart and would like for her information to be sent to the LTD as is per medical records request on file.  Medical records will be processed per patient request.

## 2011-02-15 NOTE — Telephone Encounter (Signed)
Called and left a message on voice mail asking patient to return the call.  Will need to speak with patient to explain that the patient will need to complete written request for review of information to include specifics on information in question.  Once written request has been made PCP involved will need to review.  I left a voice mail asking patient to call me back for further information in order for [place] to proceed with her request.

## 2011-02-15 NOTE — Telephone Encounter (Signed)
Spoke with patient to give her a status update. Advised her that Jamesetta So would be contacting her from this point forward as there was a process that had to be followed. She verbalized understanding but asked if possible that things be expedited as it is holding up her LTD. I told her we would do what we could, but that I had no guarantee as to how long things would take.

## 2011-03-08 ENCOUNTER — Ambulatory Visit (INDEPENDENT_AMBULATORY_CARE_PROVIDER_SITE_OTHER): Payer: BC Managed Care – PPO | Admitting: Family Medicine

## 2011-03-08 ENCOUNTER — Telehealth: Payer: Self-pay | Admitting: *Deleted

## 2011-03-08 ENCOUNTER — Encounter: Payer: Self-pay | Admitting: Family Medicine

## 2011-03-08 VITALS — BP 110/70 | HR 72 | Temp 98.0°F | Wt 319.8 lb

## 2011-03-08 DIAGNOSIS — E119 Type 2 diabetes mellitus without complications: Secondary | ICD-10-CM

## 2011-03-08 DIAGNOSIS — F3289 Other specified depressive episodes: Secondary | ICD-10-CM

## 2011-03-08 DIAGNOSIS — Z23 Encounter for immunization: Secondary | ICD-10-CM

## 2011-03-08 DIAGNOSIS — F329 Major depressive disorder, single episode, unspecified: Secondary | ICD-10-CM

## 2011-03-08 LAB — HEMOGLOBIN A1C: Hgb A1c MFr Bld: 6.1 % (ref 4.6–6.5)

## 2011-03-08 LAB — MICROALBUMIN / CREATININE URINE RATIO
Microalb Creat Ratio: 0.2 mg/g (ref 0.0–30.0)
Microalb, Ur: 0.2 mg/dL (ref 0.0–1.9)

## 2011-03-08 NOTE — Patient Instructions (Signed)
Blood work today. Consider activity in daily routine.  Consider walking in track field. Good to see you today.  Return in 6 months for follow up, sooner if needed.

## 2011-03-08 NOTE — Assessment & Plan Note (Signed)
Deteriorated.  No SI/HI. Continue to f/u with psych. Has appt with them next week

## 2011-03-08 NOTE — Assessment & Plan Note (Addendum)
Good control on current meds. Discussed with weight loss may need decrease of glyburide.  Asked to keep track of sugars, esp if feeling low, and update me if running low. Foot exam last visit. Due for vision 2013. Lab Results  Component Value Date   HGBA1C 6.1 12/02/2010  flu, pneumonia shot today.

## 2011-03-08 NOTE — Telephone Encounter (Signed)
Patient called and said she was taking Ten[place] XR 75 mg 1 every AM. Added to med list.

## 2011-03-08 NOTE — Progress Notes (Signed)
  Subjective:    Patient ID: [name], female    DOB: 06-Mar-1951, 61 y.o.   MRN: [id number]  HPI CC: 58mo f/u  1. BP - Actually not HTNive - but on BP meds for edema/fluid and kidney prevention as diabetic.  2. DM - doesn't check sugars.  Has meter at home.  Had one low episode 2 mo ago.  None since.  Has had weight loss.  On diet pill by OBGYN, will call us with name.  Last vision screen almost 1 year ago.  Will schedule at beginning of 2013.  Foot exam last visit.  Compliant with meds.  3. Mood issues - states anxiety long term, depression followed by Dr. Imogene Burn.  Sees Dr Imogene Burn and Beatrix Fetters? (psychologist).  Worsened recently.  3 wks ago started on klonopin, stopped xanax.  Caused worsening dizziness so has restarted IR xanax.  To see Dr. Imogene Burn next few weeks for f/u.  Frustrated, wants to get better but not getting better.  Denie SI, HI "i don't want to spend rest of my life in hell, and I wouldn't put my kid through that".  More tearful than usual.  Wt Readings from Last 3 Encounters:  03/08/11 319 lb 12 oz (145.038 kg)  12/09/10 329 lb (149.233 kg)  10/24/10 331 lb 0.6 oz (150.159 kg)   Recent mammo and pap smear normal.  On diet pill by OBGYN.  Past Medical History  Diagnosis Date  . Vitamin B 12 deficiency   . Anxiety   . Depression   . Hyperlipidemia   . Hypothyroid   . CAD (coronary artery disease)     nonobstructive plaque  . Obesity     morbid obesity  . Diabetes mellitus     TYPE II  . OSA (obstructive sleep apnea)   . Vitamin D deficiency    Review of Systems Per HPI    Objective:   Physical Exam  Nursing note and vitals reviewed. Constitutional: She appears well-developed and well-nourished.  HENT:  Head: Normocephalic and atraumatic.  Mouth/Throat: Oropharynx is clear and moist. No oropharyngeal exudate.  Eyes: Conjunctivae and EOM are normal. Pupils are equal, round, and reactive to light. No scleral icterus.  Neck: Normal range of motion. Neck supple.   Cardiovascular: Normal rate, regular rhythm, normal heart sounds and intact distal pulses.   No murmur heard. Pulmonary/Chest: Effort normal and breath sounds normal. No respiratory distress. She has no wheezes. She has no rales.  Musculoskeletal: She exhibits no edema.  Lymphadenopathy:    She has no cervical adenopathy.  Skin: Skin is warm and dry. No rash noted.  Psychiatric: Her speech is normal and behavior is normal. Judgment and thought content normal. Cognition and memory are normal. She exhibits a depressed mood.       sad affect      Assessment & Plan:

## 2011-03-08 NOTE — Assessment & Plan Note (Signed)
Continued weight loss.  On diet pill by OBGYN. Encouraged starting to increase activity - rec restart water aerobics or walking on track field (for even surface)

## 2011-03-08 NOTE — Telephone Encounter (Signed)
Noted  

## 2011-03-15 ENCOUNTER — Other Ambulatory Visit: Payer: Self-pay | Admitting: Family Medicine

## 2011-03-15 ENCOUNTER — Ambulatory Visit (INDEPENDENT_AMBULATORY_CARE_PROVIDER_SITE_OTHER): Payer: BC Managed Care – PPO | Admitting: Cardiology

## 2011-03-15 ENCOUNTER — Other Ambulatory Visit: Payer: Self-pay | Admitting: *Deleted

## 2011-03-15 ENCOUNTER — Encounter: Payer: Self-pay | Admitting: Cardiology

## 2011-03-15 DIAGNOSIS — I2510 Atherosclerotic heart disease of native coronary artery without angina pectoris: Secondary | ICD-10-CM

## 2011-03-15 DIAGNOSIS — E785 Hyperlipidemia, unspecified: Secondary | ICD-10-CM

## 2011-03-15 DIAGNOSIS — G4733 Obstructive sleep apnea (adult) (pediatric): Secondary | ICD-10-CM

## 2011-03-15 DIAGNOSIS — E119 Type 2 diabetes mellitus without complications: Secondary | ICD-10-CM

## 2011-03-15 MED ORDER — ASPIRIN EC 81 MG PO TBEC: 81.0000 mg | DELAYED_RELEASE_TABLET | Freq: Every day | ORAL | Status: DC

## 2011-03-15 MED ORDER — PIOGLITAZONE HCL 30 MG PO TABS: 30.0000 mg | ORAL_TABLET | Freq: Every day | ORAL | Status: DC

## 2011-03-15 NOTE — Assessment & Plan Note (Signed)
Improved. Continued to keep weight off and perhaps lose some more.

## 2011-03-15 NOTE — Progress Notes (Signed)
HPI [name] comes in for followup of her nonobstructive coronary artery disease, multiple cardiac risk factors as outlined in previous notes. She has lost 37 pounds over the last 6 months. Her last hemoglobin A1c was 6. Her last LDL was 77 and her last HDL was 60. Her blood pressures under good control. She was done despite having lost her job with [employer] after many years of service.  She denies any chest discomfort or angina. He's had no orthopnea, PND or edema. His comply with her meds.   Past Medical History  Diagnosis Date  . Vitamin B 12 deficiency   . Anxiety   . Depression   . Hyperlipidemia   . Hypothyroid   . CAD (coronary artery disease)     nonobstructive plaque  . Obesity     morbid obesity  . T2DM (type 2 diabetes mellitus)   . OSA (obstructive sleep apnea)   . Vitamin D deficiency     last check 47 (11/2010)    Past Surgical History  Procedure Date  . Osteotomy and ulnar shortening   . Knee surgery 11-02-2008    Partial medical and Lat Menisectomies and Condroplasty (Dr. Thurston Hole)  . Cardiac catheterization 10/12/09    EF 70%  . Mr abdomen 01/16/2011    Left 4cm adrenal myelolipoma    Family History  Problem Relation Age of Onset  . Coronary artery disease Mother   . Stroke Mother   . Diabetes Mother   . Heart attack Father   . Heart attack Brother     History   Social History  . Marital Status: Legally Separated    Spouse Name: N/A    Number of Children: 1  . Years of Education: N/A   Occupational History  . Nature conservation officer    Social History Main Topics  . Smoking status: Former Smoker    Quit date: 08/09/1998  . Smokeless tobacco: Not on file  . Alcohol Use: No  . Drug Use: No  . Sexually Active: Not on file   Other Topics Concern  . Not on file   Social History Narrative  . No narrative on file    Allergies  Allergen Reactions  . Erythromycin Base     REACTION: Vomitting  . Neomycin     vomiting     Current Outpatient  Prescriptions  Medication Sig Dispense Refill  . albuterol (PROAIR HFA) 108 (90 BASE) MCG/ACT inhaler Inhale 2 puffs into the lungs every 6 (six) hours as needed.        . ALPRAZolam (XANAX) 0.5 MG tablet Take 0.5 mg by mouth 3 (three) times daily as needed.        Marland Kitchen atorvastatin (LIPITOR) 40 MG tablet Take 1 tablet (40 mg total) by mouth at bedtime.  30 tablet  8  . cholecalciferol (VITAMIN D) 1000 UNITS tablet Take 1,000 Units by mouth daily.        . cyanocobalamin (,VITAMIN B-12,) 1000 MCG/ML injection INJECT 1 ML EVERY MONTH AS DIRECTED  10 mL  0  . Diethylpropion HCl 75 MG TB24 Take 1 tablet by mouth daily. For weight loss      . escitalopram (LEXAPRO) 20 MG tablet Take 30 mg by mouth daily.        . furosemide (LASIX) 20 MG tablet Take 20 mg by mouth as needed.       . glyBURIDE-metformin (GLUCOVANCE) 1.25-250 MG per tablet Take 1 tablet by mouth daily with breakfast.  90 tablet  1  . lamoTRIgine (LAMICTAL) 100 MG tablet Take 100 mg by mouth daily.        Marland Kitchen levothyroxine (SYNTHROID, LEVOTHROID) 200 MCG tablet Take 1 tablet (200 mcg total) by mouth daily.  90 tablet  1  . naproxen (NAPROSYN) 500 MG tablet Take 1 tablet (500 mg total) by mouth 2 (two) times daily with a meal.  60 tablet  3  . NEEDLE, DISP, 25 G (MONOJECT MAGELLAN SAFETY NDL) 25G X 1" MISC by Does not apply route as directed.        Marland Kitchen omeprazole (PRILOSEC) 40 MG capsule Take 40 mg by mouth daily.        . pioglitazone (ACTOS) 30 MG tablet Take 30 mg by mouth daily.        . ramipril (ALTACE) 10 MG capsule TAKE 1 CAPSULE BY MOUTH EVERY DAY  90 capsule  3  . tiZANidine (ZANAFLEX) 4 MG capsule Take 4 mg by mouth at bedtime as needed.       . traZODone (DESYREL) 50 MG tablet TAKE 1 TABLET AT BEDTIME  30 tablet  6  . triamterene-hydrochlorothiazide (MAXZIDE) 75-50 MG per tablet TAKE 1 TABLET BY MOUTH EVERY DAY  90 tablet  3  . aspirin EC 81 MG tablet Take 1 tablet (81 mg total) by mouth daily.  150 tablet  2    ROS Negative  other than HPI.   PE General Appearance: well developed, well nourished in no acute distress, obese HEENT: symmetrical face, PERRLA, good dentition  Neck: no JVD, thyromegaly, or adenopathy, trachea midline Chest: symmetric without deformity Cardiac: PMI non-displaced, RRR, normal S1, S2, no gallop or murmur Lung: clear to ausculation and percussion Vascular: all pulses full without bruits  Abdominal: nondistended, nontender, good bowel sounds, no HSM, no bruits Extremities: no cyanosis, clubbing or edema, no sign of DVT, no varicosities  Skin: normal color, no rashes Neuro: alert and oriented x 3, non-focal Pysch: normal affect Filed Vitals:   03/15/11 1359  BP: 124/76  Pulse: 80  Height: 5\' 6"  (1.676 m)  Weight: 318 lb (144.244 kg)    EKG  Labs and Studies Reviewed.   Lab Results  Component Value Date   WBC 7.5 10/18/2010   HGB 11.8* 10/18/2010   HCT 36.1 10/18/2010   MCV 87.8 10/18/2010   PLT 258 10/18/2010      Chemistry      Component Value Date/Time   NA 137 10/18/2010 0410   K 3.7 10/18/2010 0410   K 3.5 08/17/2010   CL 97 10/18/2010 0410   CO2 30 10/18/2010 0410   BUN 17 01/02/2011 1000   CREATININE 0.8 01/02/2011 1000      Component Value Date/Time   CALCIUM 8.9 10/18/2010 0410   ALKPHOS 69 09/05/2010 0912   AST 15 09/05/2010 0912   ALT 15 09/05/2010 0912   BILITOT 0.6 09/05/2010 0912       Lab Results  Component Value Date   CHOL  Value: 166        ATP III CLASSIFICATION:  <200     mg/dL   Desirable  045-409  mg/dL   Borderline High  >=811    mg/dL   High        01/27/7828   CHOL 148 07/15/2009   CHOL 157 04/06/2009   Lab Results  Component Value Date   HDL 60 08/17/2010   HDL 51 10/09/2009   HDL 63.70 07/15/2009   Lab Results  Component Value Date  LDLCALC 77 08/17/2010   LDLCALC  Value: 83        Total Cholesterol/HDL:CHD Risk Coronary Heart Disease Risk Table                     Men   Women  1/2 Average Risk   3.4   3.3  Average Risk       5.0   4.4  2 X Average Risk    9.6   7.1  3 X Average Risk  23.4   11.0        Use the calculated Patient Ratio above and the CHD Risk Table to determine the patient's CHD Risk.        ATP III CLASSIFICATION (LDL):  <100     mg/dL   Optimal  914-782  mg/dL   Near or Above                    Optimal  130-159  mg/dL   Borderline  956-213  mg/dL   High  >086     mg/dL   Very High 5/78/4696   LDLCALC 59 07/15/2009   Lab Results  Component Value Date   TRIG 108 08/17/2010   TRIG 160* 10/09/2009   TRIG 125.0 07/15/2009   Lab Results  Component Value Date   CHOLHDL 3.3 10/09/2009   CHOLHDL 2 07/15/2009   CHOLHDL 3 04/06/2009   Lab Results  Component Value Date   HGBA1C 6.1 03/08/2011   Lab Results  Component Value Date   ALT 15 09/05/2010   AST 15 09/05/2010   ALKPHOS 69 09/05/2010   BILITOT 0.6 09/05/2010   Lab Results  Component Value Date   TSH 0.944 10/17/2010

## 2011-03-15 NOTE — Assessment & Plan Note (Signed)
Stable and asymptomatic continue preventative therapy

## 2011-03-15 NOTE — Patient Instructions (Signed)
Your physician has recommended you make the following change in your medication: continue aspirin  Your physician wants you to follow-up in: 1 year with Dr. Daleen Squibb. You will receive a reminder letter in the mail two months in advance. If you don't receive a letter, please call our office to schedule the follow-up appointment.

## 2011-03-28 ENCOUNTER — Emergency Department: Payer: Self-pay | Admitting: Emergency Medicine

## 2011-04-07 ENCOUNTER — Encounter: Payer: Self-pay | Admitting: Family Medicine

## 2011-04-07 ENCOUNTER — Telehealth: Payer: Self-pay | Admitting: *Deleted

## 2011-04-07 NOTE — Telephone Encounter (Signed)
Pt is asking for order for wide wheel chair faxed to [facility].  She has a spinal fracture of right fibula.  Her ortho's office is closed today.  Fax number for [facility] [phone], attn Clydie Braun, include diagnosis on order.

## 2011-04-07 NOTE — Telephone Encounter (Signed)
Order faxed to [facility], advised pt's daughter.

## 2011-04-07 NOTE — Telephone Encounter (Signed)
Order in IN box  If denied - her orthopedic will have to do it

## 2011-04-18 ENCOUNTER — Telehealth: Payer: Self-pay | Admitting: Internal Medicine

## 2011-04-18 MED ORDER — ROPINIROLE HCL 0.25 MG PO TABS: 0.2500 mg | ORAL_TABLET | Freq: Every day | ORAL | Status: DC

## 2011-04-18 NOTE — Telephone Encounter (Signed)
Patient has broke her right leg at her ankle it is a spiral fracture in the fibula and now that she is laying around she stated she has restless leg syndrome and wanted to know  what can be done about this.  She stated it has been bothering her for a while but a lot lately since she has been lying around.  Please advise.

## 2011-04-18 NOTE — Telephone Encounter (Signed)
Is she having daily sxs?  If so, may try requip at bedtime for RLS. If not improving would like her to come in for further evaluation.

## 2011-04-19 NOTE — Telephone Encounter (Signed)
Spoke with patient. She is having symptoms daily/nightly. She says she has tried stretching, rubbing and anything else she can think of with no help. I advised to try the requip nightly and see if she has any improvement. I told her she would need a follow up if it didn't help. She verbalized understanding.

## 2011-04-22 ENCOUNTER — Other Ambulatory Visit: Payer: Self-pay | Admitting: Family Medicine

## 2011-06-11 ENCOUNTER — Other Ambulatory Visit: Payer: Self-pay | Admitting: Family Medicine

## 2011-07-18 ENCOUNTER — Ambulatory Visit (INDEPENDENT_AMBULATORY_CARE_PROVIDER_SITE_OTHER): Payer: BC Managed Care – PPO | Admitting: Family Medicine

## 2011-07-18 ENCOUNTER — Encounter: Payer: Self-pay | Admitting: Family Medicine

## 2011-07-18 VITALS — BP 120/78 | HR 76 | Temp 98.2°F | Ht 66.0 in | Wt 328.8 lb

## 2011-07-18 DIAGNOSIS — E039 Hypothyroidism, unspecified: Secondary | ICD-10-CM

## 2011-07-18 DIAGNOSIS — E119 Type 2 diabetes mellitus without complications: Secondary | ICD-10-CM

## 2011-07-18 DIAGNOSIS — E785 Hyperlipidemia, unspecified: Secondary | ICD-10-CM

## 2011-07-18 DIAGNOSIS — F3289 Other specified depressive episodes: Secondary | ICD-10-CM

## 2011-07-18 DIAGNOSIS — E538 Deficiency of other specified B group vitamins: Secondary | ICD-10-CM

## 2011-07-18 DIAGNOSIS — F329 Major depressive disorder, single episode, unspecified: Secondary | ICD-10-CM

## 2011-07-18 MED ORDER — NAPROXEN 500 MG PO TABS: 500.0000 mg | ORAL_TABLET | Freq: Two times a day (BID) | ORAL | Status: DC

## 2011-07-18 MED ORDER — FLUTICASONE PROPIONATE 50 MCG/ACT NA SUSP: 2.0000 | Freq: Every day | NASAL | Status: DC

## 2011-07-18 MED ORDER — TIZANIDINE HCL 4 MG PO CAPS: 4.0000 mg | ORAL_CAPSULE | Freq: Every evening | ORAL | Status: DC | PRN

## 2011-07-18 NOTE — Assessment & Plan Note (Signed)
Chronic. Continue following with counselor, psych. Pt looking into changing psychiatrist.

## 2011-07-18 NOTE — Assessment & Plan Note (Signed)
Chronic. Good control as of last check. Return fastingfor blood work. Foot exam today.  rec schedule vision screen as due.

## 2011-07-18 NOTE — Patient Instructions (Addendum)
Schedule physical in 3-6 months. Return fasting at your convenience for blood work. Schedule vision screen. Good to see you today, call us with questions. Try flonase nasal spray for sinus congestion.

## 2011-07-18 NOTE — Progress Notes (Signed)
  Subjective:    Patient ID: [name], female    DOB: Oct 05, 1950, 61 y.o.   MRN: [id number]  HPI CC: 5 mo f/u  Depression/anxiety - followed by Dr. Imogene Burn - thinking about changing psychiatrists.  Getting recs from counselor on new psychiatrist.  Wants liver checked - on several meds affecting liver. Noticing increased gassiness and BM changes.  ?h/o IBS.  ?arthritis (H/o spiral fracture R leg) - left hip and lower back pain.  Today not bothering her.  Yesterday lay on heating pad all day.  Requests refill of naprosyn and zanaflex.  DM - sugar running ok.  Periodically checks.  Has had lows - 50 and 60s 1 1/2 mo ago.  Due for vision screen.  Due for foot exam and blood work today.  Vision screen due.  No paresthesias.  Uses CPAP at night for OSA.  Having sinus congestion, wakes up with throat congestion.  Takes zyrtec nightly.  ?allergy vs sinus.  Doesn't do well with nasal preparations.  Would like to try flonase.  Wt Readings from Last 3 Encounters:  07/18/11 328 lb 12.8 oz (149.143 kg)  03/15/11 318 lb (144.244 kg)  03/08/11 319 lb 12 oz (145.038 kg)  prescribed weight med by Dr. Vincente Poli, will restart (off for several months).  Preventative: No recent cpe.  Past Medical History  Diagnosis Date  . Vitamin B 12 deficiency   . Anxiety   . Depression   . Hyperlipidemia   . Hypothyroid   . CAD (coronary artery disease)     nonobstructive plaque  . Obesity     morbid obesity  . T2DM (type 2 diabetes mellitus)   . OSA (obstructive sleep apnea)   . Vitamin d deficiency     last check 47 (11/2010)  . Fibula fracture    Review of Systems Per HPI    Objective:   Physical Exam  Nursing note and vitals reviewed. Constitutional: She appears well-developed and well-nourished. No distress.  HENT:  Head: Normocephalic and atraumatic.  Right Ear: External ear normal.  Left Ear: External ear normal.  Nose: Nose normal.  Mouth/Throat: Oropharynx is clear and moist. No  oropharyngeal exudate.  Eyes: Conjunctivae and EOM are normal. Pupils are equal, round, and reactive to light. No scleral icterus.  Neck: Normal range of motion. Neck supple.  Cardiovascular: Normal rate, regular rhythm, normal heart sounds and intact distal pulses.   No murmur heard. Pulmonary/Chest: Effort normal and breath sounds normal. No respiratory distress. She has no wheezes. She has no rales.  Musculoskeletal: She exhibits no edema.       Diabetic foot exam: Normal inspection No skin breakdown No calluses  Normal DP/PT pulses Normal sensation to light tough and monofilament Nails normal  Lymphadenopathy:    She has no cervical adenopathy.  Skin: Skin is warm and dry. No rash noted.  Psychiatric: She has a normal mood and affect.      Assessment & Plan:

## 2011-07-25 ENCOUNTER — Other Ambulatory Visit: Payer: BC Managed Care – PPO

## 2011-08-14 DIAGNOSIS — I5032 Chronic diastolic (congestive) heart failure: Secondary | ICD-10-CM

## 2011-08-14 HISTORY — DX: Chronic diastolic (congestive) heart failure: I50.32

## 2011-08-14 HISTORY — PX: US ECHOCARDIOGRAPHY: HXRAD669

## 2011-08-17 ENCOUNTER — Ambulatory Visit: Payer: BC Managed Care – PPO | Admitting: Internal Medicine

## 2011-08-18 ENCOUNTER — Telehealth: Payer: Self-pay | Admitting: Family Medicine

## 2011-08-18 NOTE — Telephone Encounter (Signed)
[name] asked me yesterday regarding pt and I advised pt needed Appt.  Nothing in chart re this. Pt needs to be eval either at [facility] or at [facility]. No abx without being evaluated.

## 2011-08-18 NOTE — Telephone Encounter (Signed)
Spoke with patient. She is very upset that abx won't be called in for her. She said her daughter has the same thing and all she had to do was call her dr and they prescribed a z-pack without seeing her. I advised that was our policy here and offered her an appt tomorrow at the [facility]. She refused saying she didn't want to drive all the way to [facility] for a prescription. She said she will go to [facility] or call back on Monday if worsening.

## 2011-08-18 NOTE — Telephone Encounter (Signed)
Caller: [name]/Patient; PCP: Eustaquio Boyden; CB#: [phone]; Call regarding Cough/Congestion;  [name] developed nasal congestion on 08/14/11.  Now, c/o earache, moderate headache, and chest congestion.  Afebrile.  Bloody nasal discharge.  Mild SHOB.  Utilized URI Guideline.  Call PCP disposition.  No appts available.  Advised UC. Pt refuses and requesting abx to be called in.   OFFICE, PLEASE F/U WITH [name] REGARDING MEDICATION REQUEST.

## 2011-08-19 ENCOUNTER — Encounter (HOSPITAL_COMMUNITY): Payer: Self-pay | Admitting: *Deleted

## 2011-08-19 ENCOUNTER — Other Ambulatory Visit: Payer: Self-pay

## 2011-08-19 ENCOUNTER — Inpatient Hospital Stay (HOSPITAL_COMMUNITY)
Admission: AD | Admit: 2011-08-19 | Discharge: 2011-08-24 | DRG: 544 | Disposition: A | Discharge status: Home / Self Care | Payer: BC Managed Care – PPO | Attending: Internal Medicine | Admitting: Internal Medicine

## 2011-08-19 ENCOUNTER — Emergency Department (HOSPITAL_COMMUNITY): Payer: BC Managed Care – PPO

## 2011-08-19 DIAGNOSIS — E538 Deficiency of other specified B group vitamins: Secondary | ICD-10-CM | POA: Diagnosis present

## 2011-08-19 DIAGNOSIS — I2510 Atherosclerotic heart disease of native coronary artery without angina pectoris: Secondary | ICD-10-CM | POA: Diagnosis present

## 2011-08-19 DIAGNOSIS — Z9989 Dependence on other enabling machines and devices: Secondary | ICD-10-CM | POA: Diagnosis present

## 2011-08-19 DIAGNOSIS — E1140 Type 2 diabetes mellitus with diabetic neuropathy, unspecified: Secondary | ICD-10-CM | POA: Diagnosis present

## 2011-08-19 DIAGNOSIS — F3289 Other specified depressive episodes: Secondary | ICD-10-CM | POA: Diagnosis present

## 2011-08-19 DIAGNOSIS — IMO0002 Reserved for concepts with insufficient information to code with codable children: Secondary | ICD-10-CM | POA: Diagnosis present

## 2011-08-19 DIAGNOSIS — R0789 Other chest pain: Secondary | ICD-10-CM | POA: Diagnosis present

## 2011-08-19 DIAGNOSIS — E876 Hypokalemia: Secondary | ICD-10-CM | POA: Diagnosis present

## 2011-08-19 DIAGNOSIS — G4733 Obstructive sleep apnea (adult) (pediatric): Secondary | ICD-10-CM | POA: Diagnosis present

## 2011-08-19 DIAGNOSIS — E279 Disorder of adrenal gland, unspecified: Secondary | ICD-10-CM | POA: Diagnosis present

## 2011-08-19 DIAGNOSIS — F4321 Adjustment disorder with depressed mood: Secondary | ICD-10-CM | POA: Diagnosis not present

## 2011-08-19 DIAGNOSIS — Z6841 Body Mass Index (BMI) 40.0 and over, adult: Secondary | ICD-10-CM

## 2011-08-19 DIAGNOSIS — E119 Type 2 diabetes mellitus without complications: Secondary | ICD-10-CM | POA: Diagnosis present

## 2011-08-19 DIAGNOSIS — E039 Hypothyroidism, unspecified: Secondary | ICD-10-CM | POA: Diagnosis present

## 2011-08-19 DIAGNOSIS — N182 Chronic kidney disease, stage 2 (mild): Secondary | ICD-10-CM | POA: Diagnosis present

## 2011-08-19 DIAGNOSIS — N179 Acute kidney failure, unspecified: Secondary | ICD-10-CM | POA: Diagnosis present

## 2011-08-19 DIAGNOSIS — Z87891 Personal history of nicotine dependence: Secondary | ICD-10-CM

## 2011-08-19 DIAGNOSIS — F329 Major depressive disorder, single episode, unspecified: Secondary | ICD-10-CM | POA: Diagnosis present

## 2011-08-19 DIAGNOSIS — I5031 Acute diastolic (congestive) heart failure: Principal | ICD-10-CM | POA: Diagnosis present

## 2011-08-19 DIAGNOSIS — E785 Hyperlipidemia, unspecified: Secondary | ICD-10-CM | POA: Diagnosis present

## 2011-08-19 DIAGNOSIS — I509 Heart failure, unspecified: Secondary | ICD-10-CM | POA: Diagnosis present

## 2011-08-19 DIAGNOSIS — I200 Unstable angina: Secondary | ICD-10-CM

## 2011-08-19 LAB — COMPREHENSIVE METABOLIC PANEL
AST: 17 U/L (ref 0–37)
Albumin: 3.7 g/dL (ref 3.5–5.2)
Chloride: 101 mEq/L (ref 96–112)
Creatinine, Ser: 1.06 mg/dL (ref 0.50–1.10)
Potassium: 3.6 mEq/L (ref 3.5–5.1)
Sodium: 140 mEq/L (ref 135–145)
Total Bilirubin: 0.4 mg/dL (ref 0.3–1.2)

## 2011-08-19 LAB — CBC
MCHC: 31.5 g/dL (ref 30.0–36.0)
Platelets: 217 10*3/uL (ref 150–400)
RDW: 14.9 % (ref 11.5–15.5)

## 2011-08-19 LAB — DIFFERENTIAL
Basophils Absolute: 0 10*3/uL (ref 0.0–0.1)
Basophils Relative: 0 % (ref 0–1)
Monocytes Absolute: 0.3 10*3/uL (ref 0.1–1.0)
Neutro Abs: 4.5 10*3/uL (ref 1.7–7.7)
Neutrophils Relative %: 70 % (ref 43–77)

## 2011-08-19 LAB — POCT I-STAT TROPONIN I: Troponin i, poc: 0 ng/mL (ref 0.00–0.08)

## 2011-08-19 LAB — CK TOTAL AND CKMB (NOT AT ARMC): CK, MB: 1.8 ng/mL (ref 0.3–4.0)

## 2011-08-19 MED ORDER — FUROSEMIDE 10 MG/ML IJ SOLN
80.0000 mg | Freq: Once | INTRAMUSCULAR | Status: AC
  Administered 2011-08-20: 80 mg via INTRAVENOUS
  Filled 2011-08-19: qty 8

## 2011-08-19 NOTE — ED Provider Notes (Signed)
History     CSN: [id number]  Arrival date & time 08/19/11  2057   First MD Initiated Contact with Patient 08/19/11 2301      Chief Complaint  Patient presents with  . extra fluid     (Consider location/radiation/quality/duration/timing/severity/associated sxs/prior treatment) HPI The patient presents with concerns of dyspnea and chest pain.  She notes that she has been in her usual state of health.  Approximately one week ago she developed lower extremity edema.  She subsequently developed exertional dyspnea and orthopnea.  The patient took several doses of Lasix and noted a decrease in her symptoms, however over the past 2 days she now has worsening complaints.  Chest of the pain is described as a tightness across the anterior chest that is present with motion, negligible at rest.  No tabs at pharmacologic relief when symptomatic.  The patient's dyspnea is most pronounced, similarly, with exertion.  She also notes that this is present when supine.  With rest there is minimal dyspnea. The patient is significant cough, fever, chills, nausea, vomiting, diarrhea. Past Medical History  Diagnosis Date  . Vitamin B 12 deficiency   . Anxiety   . Depression   . Hyperlipidemia   . Hypothyroid   . CAD (coronary artery disease)     nonobstructive plaque  . Obesity     morbid obesity  . T2DM (type 2 diabetes mellitus)   . OSA (obstructive sleep apnea)   . Vitamin d deficiency     last check 47 (11/2010)  . Fibula fracture     Past Surgical History  Procedure Date  . Osteotomy and ulnar shortening   . Knee surgery 11-02-2008    Partial medical and Lat Menisectomies and Condroplasty (Dr. Thurston Hole)  . Cardiac catheterization 10/12/09    EF 70%  . Mr abdomen 01/16/2011    Left 4cm adrenal myelolipoma    Family History  Problem Relation Age of Onset  . Coronary artery disease Mother   . Stroke Mother   . Diabetes Mother   . Heart attack Father   . Heart attack Brother     History    Substance Use Topics  . Smoking status: Former Smoker    Quit date: 08/09/1998  . Smokeless tobacco: Not on file  . Alcohol Use: No    OB History    Grav Para Term Preterm Abortions TAB SAB Ect Mult Living                  Review of Systems  Constitutional:       HPI  HENT:       HPI otherwise negative  Eyes: Negative.   Respiratory:       HPI, otherwise negative  Cardiovascular:       HPI, otherwise nmegative  Gastrointestinal: Negative for vomiting.  Genitourinary:       HPI, otherwise negative  Musculoskeletal:       HPI, otherwise negative  Skin: Negative.   Neurological: Negative for syncope.    Allergies  Erythromycin base and Neomycin  Home Medications   Current Outpatient Rx  Name Route Sig Dispense Refill  . ALPRAZOLAM 0.25 MG PO TABS Oral Take 0.25 mg by mouth 2 (two) times daily.    . ATORVASTATIN CALCIUM 40 MG PO TABS Oral Take 40 mg by mouth daily.    Marland Kitchen CETIRIZINE HCL 10 MG PO TABS Oral Take 10 mg by mouth daily.    Marland Kitchen VITAMIN D 1000 UNITS PO TABS Oral  Take 1,000 Units by mouth daily.      Marland Kitchen DIETHYLPROPION HCL 75 MG PO TB24 Oral Take 1 tablet by mouth daily. For weight loss    . ESCITALOPRAM OXALATE 20 MG PO TABS Oral Take 30 mg by mouth daily.      . FUROSEMIDE 20 MG PO TABS Oral Take 20 mg by mouth 2 (two) times daily.    . GLYBURIDE-METFORMIN 1.25-250 MG PO TABS Oral Take 1 tablet by mouth 2 (two) times daily with a meal.    . LAMOTRIGINE 100 MG PO TABS Oral Take 150 mg by mouth daily.     Marland Kitchen LEVOTHYROXINE SODIUM 200 MCG PO TABS Oral Take 200 mcg by mouth daily.    Marland Kitchen NAPROXEN 500 MG PO TBEC Oral Take 500 mg by mouth 2 (two) times daily with a meal.    . NAPROXEN 500 MG PO TABS Oral Take 1 tablet (500 mg total) by mouth 2 (two) times daily with a meal. 60 tablet 3  . OMEPRAZOLE 40 MG PO CPDR Oral Take 40 mg by mouth daily.      Marland Kitchen PIOGLITAZONE HCL 30 MG PO TABS Oral Take 1 tablet (30 mg total) by mouth daily. 30 tablet 6  . PIOGLITAZONE HCL 30 MG PO  TABS Oral Take 30 mg by mouth daily.    Marland Kitchen RAMIPRIL 10 MG PO TABS Oral Take 10 mg by mouth daily.    Marland Kitchen ROPINIROLE HCL 0.25 MG PO TABS Oral Take 0.25 mg by mouth 3 (three) times daily.    Marland Kitchen ROPINIROLE HCL 0.25 MG PO TABS Oral Take 0.25 mg by mouth at bedtime.    . TRAZODONE HCL 50 MG PO TABS Oral Take 150 mg by mouth at bedtime.    . TRIAMTERENE-HCTZ 75-50 MG PO TABS Oral Take 1 tablet by mouth daily.    Marland Kitchen NEEDLE (DISP) 25G X 1" MISC Does not apply by Does not apply route as directed.        BP 93/70  Pulse 68  Temp(Src) 97.7 F (36.5 C) (Oral)  Resp 19  SpO2 95%  Physical Exam  Nursing note and vitals reviewed. Constitutional: She is oriented to person, place, and time. She appears well-developed and well-nourished. No distress.       Morbidly obese female resting comfortably in bed, speaking without any difficulty  HENT:  Head: Normocephalic and atraumatic.  Eyes: Conjunctivae and EOM are normal.  Cardiovascular: Normal rate and regular rhythm.   Pulmonary/Chest: Effort normal and breath sounds normal. No stridor. No respiratory distress.  Abdominal: She exhibits no distension.  Musculoskeletal: She exhibits no edema.  Neurological: She is alert and oriented to person, place, and time. No cranial nerve deficit.  Skin: Skin is warm and dry.  Psychiatric: She has a normal mood and affect.    ED Course  Procedures (including critical care time)  Labs Reviewed  CBC - Abnormal; Notable for the following:    Hemoglobin 11.5 (*)    All other components within normal limits  COMPREHENSIVE METABOLIC PANEL - Abnormal; Notable for the following:    Glucose, Bld 159 (*)    BUN 30 (*)    GFR calc non Af Amer 56 (*)    GFR calc Af Amer 65 (*)    All other components within normal limits  DIFFERENTIAL  CK TOTAL AND CKMB  POCT I-STAT TROPONIN I  PRO B NATRIURETIC PEPTIDE  URINALYSIS, ROUTINE W REFLEX MICROSCOPIC   No results found.   No  diagnosis found.  Cardiac monitor 75  sinus rhythm normal Pulse oximetry 93% room air abnormal I have reviewed the chest x-ray   Date: 08/19/2011  Rate: 78  Rhythm: normal sinus rhythm  QRS Axis: normal  Intervals: normal  ST/T Wave abnormalities: normal  Conduction Disutrbances:none  Narrative Interpretation:   Old EKG Reviewed: changes noted Minimal t wave changes, low voltage.  Borderline ECG  I reviewed prior notes.  MDM  This obese female with multiple medical problems now presents with ongoing edema and dyspnea.  On my exam the patient is in no distress, though she is mildly hypoxic.  Patient has a history of Lasix provision, though she is not aware of a prior history of heart failure.  However, the patient tissues most consistent with and concerning for a heart failure exacerbation.  The absence of significant chest pain, ischemic EKG changes he is referred for the low probability of ACS, this was supported by the absence of positive troponins.  The patient's labs notable for a BNP of 1603.  The patient received Lasix, but not nitroglycerin, do to her blood pressure of 100/80.  She was admitted to the hospitalist service for this admission for further evaluation and management of her heart failure.        Gerhard Munch, MD 08/20/11 [phone]

## 2011-08-19 NOTE — ED Notes (Signed)
The pt has not been well for 3-4 days.  She has extremity swelling sob headache  Some chest tightness today,.  Pt alert

## 2011-08-20 ENCOUNTER — Other Ambulatory Visit: Payer: Self-pay

## 2011-08-20 ENCOUNTER — Encounter (HOSPITAL_COMMUNITY): Payer: Self-pay | Admitting: Family Medicine

## 2011-08-20 DIAGNOSIS — I509 Heart failure, unspecified: Secondary | ICD-10-CM

## 2011-08-20 DIAGNOSIS — R0789 Other chest pain: Secondary | ICD-10-CM | POA: Insufficient documentation

## 2011-08-20 LAB — GLUCOSE, CAPILLARY
Glucose-Capillary: 90 mg/dL (ref 70–99)
Glucose-Capillary: 119 mg/dL — ABNORMAL HIGH (ref 70–99)

## 2011-08-20 LAB — CBC
MCV: 91.5 fL (ref 78.0–100.0)
Platelets: 235 10*3/uL (ref 150–400)
RBC: 3.99 MIL/uL (ref 3.87–5.11)
WBC: 7.8 10*3/uL (ref 4.0–10.5)

## 2011-08-20 LAB — PRO B NATRIURETIC PEPTIDE: Pro B Natriuretic peptide (BNP): 1480 pg/mL — ABNORMAL HIGH (ref 0–125)

## 2011-08-20 LAB — CARDIAC PANEL(CRET KIN+CKTOT+MB+TROPI)
CK, MB: 1.7 ng/mL (ref 0.3–4.0)
CK, MB: 1.5 ng/mL (ref 0.3–4.0)
Relative Index: INVALID (ref 0.0–2.5)
Total CK: 72 U/L (ref 7–177)
Total CK: 71 U/L (ref 7–177)
Troponin I: <0.3 ng/mL (ref <0.30)
Troponin I: <0.3 ng/mL (ref <0.30)

## 2011-08-20 LAB — CREATININE, SERUM
Creatinine, Ser: 1.04 mg/dL (ref 0.50–1.10)
GFR calc Af Amer: 67 mL/min — ABNORMAL LOW (ref >90)

## 2011-08-20 LAB — URINE MICROSCOPIC-ADD ON

## 2011-08-20 LAB — URINALYSIS, ROUTINE W REFLEX MICROSCOPIC
Bilirubin Urine: NEGATIVE
Hgb urine dipstick: NEGATIVE
Protein, ur: NEGATIVE mg/dL
Specific Gravity, Urine: 1.015 (ref 1.005–1.030)
Urobilinogen, UA: 0.2 mg/dL (ref 0.0–1.0)

## 2011-08-20 LAB — HEMOGLOBIN A1C: Hgb A1c MFr Bld: 5.7 % — ABNORMAL HIGH (ref <5.7)

## 2011-08-20 MED ORDER — LORATADINE 10 MG PO TABS
10.0000 mg | ORAL_TABLET | Freq: Every day | ORAL | Status: DC
  Administered 2011-08-20 – 2011-08-24 (×6): 10 mg via ORAL
  Filled 2011-08-20 (×5): qty 1

## 2011-08-20 MED ORDER — ENOXAPARIN SODIUM 40 MG/0.4ML ~~LOC~~ SOLN
40.0000 mg | SUBCUTANEOUS | Status: DC
  Administered 2011-08-20 – 2011-08-22 (×3): 40 mg via SUBCUTANEOUS
  Filled 2011-08-20 (×3): qty 0.4

## 2011-08-20 MED ORDER — ATORVASTATIN CALCIUM 40 MG PO TABS
40.0000 mg | ORAL_TABLET | Freq: Every day | ORAL | Status: DC
  Administered 2011-08-20 – 2011-08-23 (×4): 40 mg via ORAL
  Filled 2011-08-20 (×5): qty 1

## 2011-08-20 MED ORDER — SODIUM CHLORIDE 0.9 % IJ SOLN
3.0000 mL | Freq: Two times a day (BID) | INTRAMUSCULAR | Status: DC
  Administered 2011-08-20 – 2011-08-24 (×7): 3 mL via INTRAVENOUS

## 2011-08-20 MED ORDER — ALPRAZOLAM 0.25 MG PO TABS
0.2500 mg | ORAL_TABLET | Freq: Two times a day (BID) | ORAL | Status: DC
  Administered 2011-08-20 – 2011-08-24 (×9): 0.25 mg via ORAL
  Filled 2011-08-20 (×9): qty 1

## 2011-08-20 MED ORDER — PANTOPRAZOLE SODIUM 40 MG PO TBEC
40.0000 mg | DELAYED_RELEASE_TABLET | Freq: Every day | ORAL | Status: DC
  Administered 2011-08-20 – 2011-08-23 (×4): 40 mg via ORAL
  Filled 2011-08-20 (×5): qty 1

## 2011-08-20 MED ORDER — TRAZODONE HCL 150 MG PO TABS
150.0000 mg | ORAL_TABLET | Freq: Every day | ORAL | Status: DC
  Administered 2011-08-20 – 2011-08-23 (×4): 150 mg via ORAL
  Filled 2011-08-20 (×5): qty 1

## 2011-08-20 MED ORDER — ACETAMINOPHEN 325 MG PO TABS
650.0000 mg | ORAL_TABLET | ORAL | Status: DC | PRN
  Administered 2011-08-20 – 2011-08-22 (×5): 650 mg via ORAL
  Filled 2011-08-20 (×4): qty 2

## 2011-08-20 MED ORDER — RAMIPRIL 10 MG PO CAPS
10.0000 mg | ORAL_CAPSULE | Freq: Every day | ORAL | Status: DC
  Administered 2011-08-20 – 2011-08-21 (×2): 10 mg via ORAL
  Filled 2011-08-20 (×3): qty 1

## 2011-08-20 MED ORDER — RAMIPRIL 10 MG PO TABS: 10.0000 mg | ORAL_TABLET | Freq: Every day | ORAL | Status: DC

## 2011-08-20 MED ORDER — INSULIN ASPART 100 UNIT/ML ~~LOC~~ SOLN
0.0000 [IU] | Freq: Three times a day (TID) | SUBCUTANEOUS | Status: DC
  Administered 2011-08-23: 4 [IU] via SUBCUTANEOUS
  Administered 2011-08-24: 3 [IU] via SUBCUTANEOUS

## 2011-08-20 MED ORDER — POTASSIUM CHLORIDE CRYS ER 20 MEQ PO TBCR
20.0000 meq | EXTENDED_RELEASE_TABLET | Freq: Two times a day (BID) | ORAL | Status: DC
  Administered 2011-08-20 – 2011-08-24 (×8): 20 meq via ORAL
  Filled 2011-08-20 (×12): qty 1

## 2011-08-20 MED ORDER — FUROSEMIDE 10 MG/ML IJ SOLN
80.0000 mg | Freq: Two times a day (BID) | INTRAMUSCULAR | Status: DC
  Administered 2011-08-20 – 2011-08-21 (×3): 80 mg via INTRAVENOUS
  Filled 2011-08-20 (×5): qty 8

## 2011-08-20 MED ORDER — ALPRAZOLAM 0.25 MG PO TABS: 0.2500 mg | ORAL_TABLET | Freq: Two times a day (BID) | ORAL | Status: DC

## 2011-08-20 MED ORDER — LAMOTRIGINE 150 MG PO TABS
150.0000 mg | ORAL_TABLET | Freq: Every day | ORAL | Status: DC
  Administered 2011-08-20 – 2011-08-23 (×4): 150 mg via ORAL
  Filled 2011-08-20 (×5): qty 1

## 2011-08-20 MED ORDER — LEVOTHYROXINE SODIUM 200 MCG PO TABS
200.0000 ug | ORAL_TABLET | Freq: Every day | ORAL | Status: DC
  Administered 2011-08-20 – 2011-08-24 (×5): 200 ug via ORAL
  Filled 2011-08-20 (×6): qty 1

## 2011-08-20 MED ORDER — TRIAMTERENE-HCTZ 75-50 MG PO TABS
1.0000 | ORAL_TABLET | Freq: Every day | ORAL | Status: DC
  Administered 2011-08-20 – 2011-08-21 (×2): 1 via ORAL
  Filled 2011-08-20 (×3): qty 1

## 2011-08-20 MED ORDER — SODIUM CHLORIDE 0.9 % IJ SOLN: 3.0000 mL | INTRAMUSCULAR | Status: DC | PRN

## 2011-08-20 MED ORDER — ROPINIROLE HCL 0.25 MG PO TABS
0.2500 mg | ORAL_TABLET | Freq: Three times a day (TID) | ORAL | Status: DC
  Administered 2011-08-20 – 2011-08-24 (×12): 0.25 mg via ORAL
  Filled 2011-08-20 (×16): qty 1

## 2011-08-20 MED ORDER — ASPIRIN EC 81 MG PO TBEC
81.0000 mg | DELAYED_RELEASE_TABLET | Freq: Every day | ORAL | Status: DC
  Administered 2011-08-20 – 2011-08-24 (×5): 81 mg via ORAL
  Filled 2011-08-20 (×5): qty 1

## 2011-08-20 MED ORDER — SODIUM CHLORIDE 0.9 % IV SOLN: 250.0000 mL | INTRAVENOUS | Status: DC | PRN

## 2011-08-20 MED ORDER — ROPINIROLE HCL 0.25 MG PO TABS
0.2500 mg | ORAL_TABLET | Freq: Three times a day (TID) | ORAL | Status: DC
  Filled 2011-08-20 (×3): qty 1

## 2011-08-20 MED ORDER — ONDANSETRON HCL 4 MG/2ML IJ SOLN: 4.0000 mg | Freq: Four times a day (QID) | INTRAMUSCULAR | Status: DC | PRN

## 2011-08-20 MED ORDER — ESCITALOPRAM OXALATE 20 MG PO TABS
30.0000 mg | ORAL_TABLET | Freq: Every day | ORAL | Status: DC
  Administered 2011-08-20 – 2011-08-24 (×5): 30 mg via ORAL
  Filled 2011-08-20 (×5): qty 1

## 2011-08-20 NOTE — Progress Notes (Signed)
9:13 AM I agree with HPI/GPe and A/P per Dr. Laural Benes.  Patient started to experience Chest pressure and some inc SOB Thrusdaya nd took some lasix pills that she had left over.  She usually doesn;t take lasix instead maxide, and then as she felt worse with increasing fluid, decided to come to tthe hospital.  She has no specific issues right now, feels better than prior, denies any CP right now, and was ambulant to RR this am    HEENT-morbidly obese CF in NAD CHEST-clinically clear, diminshed BS's, no rales, no TVR/TVF CARDIAC-s1 s2 no m/r/g, NO JVD, rrr, Tele nsr ABDOMEN-obese nt/nd NEURO-intact SKIN/MUSCULAR-no specificacute muscle findings, Has trace to +1 edema  Patient Active Problem List  Diagnoses  . CHF EF 60% 10/13/2009 (inadequate study?)-has classic CHF exacebr, cardiac markes x 1 are neg,  We will continue to rule out.  Will diurese with lasix iv x 1 day and reassess in am. 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 DIABETES MELLITUS, TYPE II-last A1c 6.1 03/08/11-check CBG's.  HOld oral meds, SSI  . B12 DEFICIENCY  . Unspecified vitamin D deficiency  . HYPERLIPIDEMIA-MIXED-LDL=77, HDL 60  . HYPOKALEMIA  . OBESITY, MORBID  . DEPRESSION  . OBSTRUCTIVE SLEEP APNEA-conmtinue CPAP at ngiht per home settings  . Adrenal mass, left-stable 4cm mass per MRI 2011  . 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  Follow BMETS  . Atypical chest pain-non obstr cath 09/2009-sytable at present.  Would rule out

## 2011-08-20 NOTE — Progress Notes (Signed)
Admitted pt to rm 4713 from ED via stretcher, pt is alert and oriented, denied pain at this time, oriented to room, call bell placed within reach. SR on heart monitor. Admission assessment done. Heart Failure education initiated. Will continue to monitor.  Filed Vitals:   08/20/11 0308  BP: 123/77  Pulse: 64  Temp: 97.7 F (36.5 C)  Resp: 18   Hava Massingale, [address]

## 2011-08-20 NOTE — Progress Notes (Signed)
  Echocardiogram 2D Echocardiogram has been performed.  Rheanna Sergent, Real [facility] 08/20/2011, 11:38 AM

## 2011-08-20 NOTE — H&P (Signed)
History and Physical Examination  Date: 08/20/2011  Patient name: [name] Medical record number: [id number] Date of birth: 02-27-1951 Age: 61 y.o. Gender: female PCP: Eustaquio Boyden, MD, MD  Attending physician: Rhetta Mura, MD  Chief Complaint:  Chief Complaint  Patient presents with  . extra fluid      History of Present Illness: [name] is an 61 y.o. female presents with concerns of dyspnea and chest pain.  The patient reports that for the last several days she's been having progressive shortness of breath.  She reports that when she lies back in a recumbent position she feels smothered.  The patient reports that she has never experienced this before.  She reports that she had an echocardiogram several years ago but it was reportedly normal.  She does have lower extremity venous stasis edema and takes Lasix intermittently as needed for excess fluid.  Reports of tightness in his chest that she's been experiencing has gotten worse over the past 24 hours.  She notes that she has been in her usual state of health. Approximately one week ago she developed worsening lower extremity edema. She subsequently developed exertional dyspnea and orthopnea. The patient took several doses of Lasix and noted a decrease in her symptoms, however over the past 2 days she now has worsening complaints. Chest of the pain is described as a tightness across the anterior chest that is present with motion, negligible at rest. No tabs at pharmacologic relief when symptomatic. The patient's dyspnea is most pronounced, similarly, with exertion. She also notes that this is present when supine.  The patient had an elevated BNP of over 1600 when evaluated in the emergency department.  With rest there is minimal dyspnea. The patient is significant cough, fever, chills, nausea, vomiting, diarrhea.  Hospital admission was requested for further evaluation and management.   Past Medical History Past  Medical History  Diagnosis Date  . Vitamin B 12 deficiency   . Anxiety   . Depression   . Hyperlipidemia   . Hypothyroid   . CAD (coronary artery disease)     nonobstructive plaque  . Obesity     morbid obesity  . T2DM (type 2 diabetes mellitus)   . OSA (obstructive sleep apnea)   . Vitamin d deficiency     last check 47 (11/2010)  . Fibula fracture     Past Surgical History Past Surgical History  Procedure Date  . Osteotomy and ulnar shortening   . Knee surgery 11-02-2008    Partial medical and Lat Menisectomies and Condroplasty (Dr. Thurston Hole)  . Cardiac catheterization 10/12/09    EF 70%  . Mr abdomen 01/16/2011    Left 4cm adrenal myelolipoma    Home Meds: Prior to Admission medications   Medication Sig Start Date End Date Taking? Authorizing Provider  ALPRAZolam (XANAX) 0.25 MG tablet Take 0.25 mg by mouth 2 (two) times daily.   Yes Historical Provider, MD  atorvastatin (LIPITOR) 40 MG tablet Take 40 mg by mouth daily.   Yes Historical Provider, MD  cetirizine (ZYRTEC) 10 MG tablet Take 10 mg by mouth daily.   Yes Historical Provider, MD  cholecalciferol (VITAMIN D) 1000 UNITS tablet Take 1,000 Units by mouth daily.     Yes Historical Provider, MD  Diethylpropion HCl 75 MG TB24 Take 1 tablet by mouth daily. For weight loss   Yes Historical Provider, MD  escitalopram (LEXAPRO) 20 MG tablet Take 30 mg by mouth daily.     Yes Historical  Provider, MD  furosemide (LASIX) 20 MG tablet Take 20 mg by mouth 2 (two) times daily.   Yes Historical Provider, MD  glyBURIDE-metformin (GLUCOVANCE) 1.25-250 MG per tablet Take 1 tablet by mouth 2 (two) times daily with a meal.   Yes Historical Provider, MD  lamoTRIgine (LAMICTAL) 100 MG tablet Take 150 mg by mouth daily.    Yes Historical Provider, MD  levothyroxine (SYNTHROID, LEVOTHROID) 200 MCG tablet Take 200 mcg by mouth daily.   Yes Historical Provider, MD  naproxen (EC NAPROSYN) 500 MG EC tablet Take 500 mg by mouth 2 (two) times daily  with a meal.   Yes Historical Provider, MD  naproxen (NAPROSYN) 500 MG tablet Take 1 tablet (500 mg total) by mouth 2 (two) times daily with a meal. 07/18/11 07/17/12 Yes Eustaquio Boyden, MD  omeprazole (PRILOSEC) 40 MG capsule Take 40 mg by mouth daily.   11/29/10  Yes Eustaquio Boyden, MD  pioglitazone (ACTOS) 30 MG tablet Take 1 tablet (30 mg total) by mouth daily. 03/15/11  Yes Eustaquio Boyden, MD  pioglitazone (ACTOS) 30 MG tablet Take 30 mg by mouth daily.   Yes Historical Provider, MD  ramipril (ALTACE) 10 MG tablet Take 10 mg by mouth daily.   Yes Historical Provider, MD  rOPINIRole (REQUIP) 0.25 MG tablet Take 0.25 mg by mouth 3 (three) times daily.   Yes Historical Provider, MD  rOPINIRole (REQUIP) 0.25 MG tablet Take 0.25 mg by mouth at bedtime.   Yes Historical Provider, MD  traZODone (DESYREL) 50 MG tablet Take 150 mg by mouth at bedtime.   Yes Historical Provider, MD  triamterene-hydrochlorothiazide (MAXZIDE) 75-50 MG per tablet Take 1 tablet by mouth daily.   Yes Historical Provider, MD  NEEDLE, DISP, 25 G (MONOJECT MAGELLAN SAFETY NDL) 25G X 1" MISC by Does not apply route as directed.      Historical Provider, MD    Allergies: Erythromycin base and Neomycin  Social History:  History   Social History  . Marital Status: Legally Separated    Spouse Name: N/A    Number of Children: 1  . Years of Education: N/A   Occupational History  . Nature conservation officer    Social History Main Topics  . Smoking status: Former Smoker    Quit date: 08/09/1998  . Smokeless tobacco: Not on file  . Alcohol Use: No  . Drug Use: No  . Sexually Active: Not on file   Other Topics Concern  . Not on file   Social History Narrative  . No narrative on file   Family History:  Family History  Problem Relation Age of Onset  . Coronary artery disease Mother   . Stroke Mother   . Diabetes Mother   . Heart attack Father   . Heart attack Brother     Review of Systems: Pertinent items are  noted in HPI. All other systems reviewed and reported as negative.   Physical Exam: Blood pressure 93/70, pulse 69, temperature 97.7 F (36.5 C), temperature source Oral, resp. rate 17, SpO2 92.00%. Constitutional: She is oriented to person, place, and time. She appears well-developed and well-nourished. No distress.  obese female resting comfortably in bed, speaking without any difficulty  Head: Normocephalic and atraumatic. mmm  Eyes: Conjunctivae and EOM are normal.  Cardiovascular: Normal rate and regular rhythm.  Pulmonary/Chest: Effort normal and breath sounds with minimal crackles. No stridor. No respiratory distress.  Abdominal: She exhibits no distension.  Musculoskeletal: Venous stasis bilateral  Neurological: She is alert  and oriented to person, place, and time. No cranial nerve deficit.  Skin: Skin is warm and dry.  Psychiatric: She has a normal mood and affect.    Lab  And Imaging results:  Results for orders placed during the hospital encounter of 08/19/11 (from the past 24 hour(s))  CBC     Status: Abnormal   Collection Time   08/19/11  9:48 PM      Component Value Range   WBC 6.5  4.0 - 10.5 (K/uL)   RBC 3.94  3.87 - 5.11 (MIL/uL)   Hemoglobin 11.5 (*) 12.0 - 15.0 (g/dL)   HCT 40.9  81.1 - 91.4 (%)   MCV 92.6  78.0 - 100.0 (fL)   MCH 29.2  26.0 - 34.0 (pg)   MCHC 31.5  30.0 - 36.0 (g/dL)   RDW 78.2  95.6 - 21.3 (%)   Platelets 217  150 - 400 (K/uL)  DIFFERENTIAL     Status: Normal   Collection Time   08/19/11  9:48 PM      Component Value Range   Neutrophils Relative 70  43 - 77 (%)   Neutro Abs 4.5  1.7 - 7.7 (K/uL)   Lymphocytes Relative 21  12 - 46 (%)   Lymphs Abs 1.4  0.7 - 4.0 (K/uL)   Monocytes Relative 4  3 - 12 (%)   Monocytes Absolute 0.3  0.1 - 1.0 (K/uL)   Eosinophils Relative 4  0 - 5 (%)   Eosinophils Absolute 0.3  0.0 - 0.7 (K/uL)   Basophils Relative 0  0 - 1 (%)   Basophils Absolute 0.0  0.0 - 0.1 (K/uL)  COMPREHENSIVE METABOLIC PANEL      Status: Abnormal   Collection Time   08/19/11  9:48 PM      Component Value Range   Sodium 140  135 - 145 (mEq/L)   Potassium 3.6  3.5 - 5.1 (mEq/L)   Chloride 101  96 - 112 (mEq/L)   CO2 29  19 - 32 (mEq/L)   Glucose, Bld 159 (*) 70 - 99 (mg/dL)   BUN 30 (*) 6 - 23 (mg/dL)   Creatinine, Ser 0.86  0.50 - 1.10 (mg/dL)   Calcium 9.3  8.4 - 57.8 (mg/dL)   Total Protein 7.0  6.0 - 8.3 (g/dL)   Albumin 3.7  3.5 - 5.2 (g/dL)   AST 17  0 - 37 (U/L)   ALT 15  0 - 35 (U/L)   Alkaline Phosphatase 74  39 - 117 (U/L)   Total Bilirubin 0.4  0.3 - 1.2 (mg/dL)   GFR calc non Af Amer 56 (*) >90 (mL/min)   GFR calc Af Amer 65 (*) >90 (mL/min)  CK TOTAL AND CKMB     Status: Normal   Collection Time   08/19/11  9:49 PM      Component Value Range   Total CK 75  7 - 177 (U/L)   CK, MB 1.8  0.3 - 4.0 (ng/mL)   Relative Index RELATIVE INDEX IS INVALID  0.0 - 2.5   POCT I-STAT TROPONIN I     Status: Normal   Collection Time   08/19/11  9:57 PM      Component Value Range   Troponin i, poc 0.00  0.00 - 0.08 (ng/mL)   Comment 3           PRO B NATRIURETIC PEPTIDE     Status: Abnormal   Collection Time   08/19/11 11:20 PM  Component Value Range   Pro B Natriuretic peptide (BNP) 1603.0 (*) 0 - 125 (pg/mL)  URINALYSIS, ROUTINE W REFLEX MICROSCOPIC     Status: Abnormal   Collection Time   08/20/11 12:21 AM      Component Value Range   Color, Urine YELLOW  YELLOW    APPearance CLEAR  CLEAR    Specific Gravity, Urine 1.015  1.005 - 1.030    pH 7.0  5.0 - 8.0    Glucose, UA NEGATIVE  NEGATIVE (mg/dL)   Hgb urine dipstick NEGATIVE  NEGATIVE    Bilirubin Urine NEGATIVE  NEGATIVE    Ketones, ur NEGATIVE  NEGATIVE (mg/dL)   Protein, ur NEGATIVE  NEGATIVE (mg/dL)   Urobilinogen, UA 0.2  0.0 - 1.0 (mg/dL)   Nitrite NEGATIVE  NEGATIVE    Leukocytes, UA TRACE (*) NEGATIVE   URINE MICROSCOPIC-ADD ON     Status: Normal   Collection Time   08/20/11 12:21 AM      Component Value Range   Squamous Epithelial / LPF  RARE  RARE    WBC, UA 0-2  <3 (WBC/hpf)   RBC / HPF 0-2  <3 (RBC/hpf)   Bacteria, UA RARE  RARE    EKG Results:  Orders placed during the hospital encounter of 08/19/11  . ED EKG  . ED EKG     Impression   CHF (congestive heart failure)  HYPOTHYROIDISM  DIABETES MELLITUS, TYPE II  HYPERLIPIDEMIA-MIXED  OBESITY, MORBID  OBSTRUCTIVE SLEEP APNEA  Atypical chest pain  Plan  Admit to telemetry bed, continue diuresis with furosemide IV, low sodium diet, check an echocardiogram, cycle cardiac enzymes to rule out myocardial ischemia, elevate head of bed 45, DVT prophylaxis, monitor ins and outs closely and daily weights, treat sleep apnea, monitor blood glucose, discontinue Actos and other oral diabetes medicines at this time, follow potassium and electrolytes, check magnesium.  Supplement potassium.  Please see orders and follow hospital course.  Standley Dakins MD [facility] [place], [place] [phone] 08/20/2011, 1:13 AM

## 2011-08-21 ENCOUNTER — Telehealth: Payer: Self-pay | Admitting: Family Medicine

## 2011-08-21 LAB — BASIC METABOLIC PANEL
Calcium: 9.4 mg/dL (ref 8.4–10.5)
GFR calc non Af Amer: 53 mL/min — ABNORMAL LOW (ref >90)
Glucose, Bld: 102 mg/dL — ABNORMAL HIGH (ref 70–99)
Sodium: 139 mEq/L (ref 135–145)

## 2011-08-21 LAB — GLUCOSE, CAPILLARY: Glucose-Capillary: 94 mg/dL (ref 70–99)

## 2011-08-21 LAB — CARDIAC PANEL(CRET KIN+CKTOT+MB+TROPI): Total CK: 60 U/L (ref 7–177)

## 2011-08-21 MED ORDER — METFORMIN HCL ER 500 MG PO TB24
500.0000 mg | ORAL_TABLET | Freq: Every day | ORAL | Status: DC
  Administered 2011-08-22: 500 mg via ORAL
  Filled 2011-08-21 (×4): qty 1

## 2011-08-21 MED ORDER — FUROSEMIDE 40 MG PO TABS
40.0000 mg | ORAL_TABLET | Freq: Two times a day (BID) | ORAL | Status: DC
  Administered 2011-08-21 – 2011-08-24 (×5): 40 mg via ORAL
  Filled 2011-08-21 (×8): qty 1

## 2011-08-21 NOTE — Progress Notes (Signed)
        CARE MANAGEMENT NOTE 08/21/2011  Patient:  [name], [name]   Account Number:  [id number]  Date Initiated:  08/21/2011  Documentation initiated by:  Onnie Boer  Subjective/Objective Assessment:   PT WAS ADMITTED WITH DYSPNEA AND EXTREMITY SWELLING     Action/Plan:   PROGRESSION OF CARE AND DISCHARGE PLANNING   Anticipated DC Date:     Anticipated DC Plan:  HOME W HOME HEALTH SERVICES      DC Planning Services  CM consult      Choice offered to / List presented to:             Status of service:  In process, will continue to follow Medicare Important Message given?   (If response is "NO", the following Medicare IM given date fields will be blank) Date Medicare IM given:   Date Additional Medicare IM given:    Discharge Disposition:    Per UR Regulation:  Reviewed for med. necessity/level of care/duration of stay  If discussed at Long Length of Stay Meetings, dates discussed:    Comments:  08/21/11 Onnie Boer, RN, BSN [phone] UR COMPLETED

## 2011-08-21 NOTE — Telephone Encounter (Signed)
Noted. Thanks.

## 2011-08-21 NOTE — Telephone Encounter (Signed)
Reviewed EMR for scheduling and phone notes and the audit trail includes the following: Appointment made on 08/17/2011 1:13 PM Appointment canceled on 08/17/2011 2:16 PM Notes in scheduling state the patient will call back if symptoms worsen

## 2011-08-21 NOTE — Plan of Care (Signed)
Problem: Food- and Nutrition-Related Knowledge Deficit (NB-1.1) Goal: Nutrition education Formal process to instruct or train a patient/client in a skill or to impart knowledge to help patients/clients voluntarily manage or modify food choices and eating behavior to maintain or improve health.  Outcome: Completed/Met Date Met:  08/21/11 Knowledge deficit resolved with diet education 4/8.   Comments:  Discussed and provided handouts obtained from the ADA nutrition care manual, "Nutrition Therapy for Type II Diabetes" and "Low-Sodium Diet". Instructed patient on food label reading. She reported that she checks her blood sugar regularly. Encouraged patient to continue to check blood sugar regularly and to read food labels. Patient asked good questions and is without any further diet related questions at this time. Patient verbalized understanding. Expect fair compliance.   RD to follow for nutrition needs.  Iven Finn [facility] [phone]

## 2011-08-21 NOTE — Plan of Care (Signed)
Problem: Phase I Progression Outcomes Goal: EF % per last Echo/documented,Core Reminder form on chart Outcome: Completed/Met Date Met:  08/21/11 55-60%

## 2011-08-21 NOTE — Progress Notes (Signed)
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 Pulmonary hypertension,  Consultants:  None  Procedures:  Chest x-ray 4/6 = thoracic spondylosis, equal focal cephalization blood flow pressure? mild pulmonary venous hypertension, no overt edema  2-D echo 4/7 = EF 55-60%, indeterminant diastolic function, no regional wall motion abnormalities with? Right PA pressure 6-10 mmHg  Antibiotics:  None   Subjective  Feels much better, got up and had a shower and ambulated. No further shortness of breath. No cough. Chest pain however does have twinges of occasional discomfort in the chest which are fleeting. Appetite fine.  Daughter in room   Objective   Interim History: Chart reviewed and nursing notes reviewed  Objective: Filed Vitals:   08/20/11 2202 08/21/11 0339 08/21/11 0627 08/21/11 1113  BP: 113/63 144/77 119/69 109/71  Pulse: 65 64 62 66  Temp: 98.2 F (36.8 C) 97.8 F (36.6 C) 97.6 F (36.4 C)   TempSrc: Oral Oral Oral   Resp: 17 18 18 18   Height:      Weight:  149.007 kg (328 lb 8 oz)    SpO2: 96% 98% 93% 94%    Intake/Output Summary (Last 24 hours) at 08/21/11 1118 Last data filed at 08/21/11 1117  Gross per 24 hour  Intake    798 ml  Output   3475 ml  Net  -2677 ml    Exam:  HEENT-morbidly obese CF in NAD  CHEST-clinically clear, diminshed BS's, no rales, no TVR/TVF  CARDIAC-s1 s2 no m/r/g, NO JVD, rrr, Tele nsr  ABDOMEN-obese nt/nd  NEURO-intact  SKIN/MUSCULAR-no specificacute muscle findings, Has trace edema, decreased from  yesterday  Data Reviewed: Basic Metabolic Panel:  Lab 08/21/11 9562 08/20/11 0307 08/19/11 2148  NA 139 -- 140  K 3.6 -- 3.6  CL 98 -- 101  CO2 29 -- 29  GLUCOSE 102* -- 159*  BUN 35* -- 30*  CREATININE 1.12* 1.04 1.06  CALCIUM 9.4 -- 9.3  MG -- 2.0 --  PHOS -- -- --   Liver Function Tests:  Lab 08/19/11 2148  AST 17  ALT 15  ALKPHOS 74  BILITOT 0.4  PROT 7.0  ALBUMIN 3.7   No results found for this basename: LIPASE:5,AMYLASE:5 in the last 168 hours No results found for this basename: AMMONIA:5 in the last 168 hours CBC:  Lab 08/20/11 0307 08/19/11 2148  WBC 7.8 6.5  NEUTROABS -- 4.5  HGB 11.7* 11.5*  HCT 36.5 36.5  MCV 91.5 92.6  PLT 235 217   Cardiac Enzymes:  Lab 08/21/11 0216 08/20/11 1653 08/20/11 1000 08/20/11 0307 08/19/11 2149  CKTOTAL 60 71 75 72 75  CKMB 1.4 1.5 1.6 1.7 1.8  CKMBINDEX -- -- -- -- --  TROPONINI <0.30 <0.30 <0.30 <0.30 --   BNP: No components found with this basename: POCBNP:5 CBG:  Lab 08/21/11 0624 08/20/11 2207 08/20/11 1637 08/20/11 1150 08/20/11 0604  GLUCAP 91 108* 119* 90 110*    No results found for this or any previous visit (from the past 240 hour(s)).   Studies:              All Imaging reviewed and is as per above  notation   Scheduled Meds:   . ALPRAZolam  0.25 mg Oral BID  . aspirin EC  81 mg Oral Daily  . atorvastatin  40 mg Oral q1800  . enoxaparin  40 mg Subcutaneous Q24H  . escitalopram  30 mg Oral Daily  . furosemide  40 mg Oral BID  . insulin aspart  0-20 Units Subcutaneous TID WC  . lamoTRIgine  150 mg Oral Daily  . levothyroxine  200 mcg Oral QAC breakfast  . loratadine  10 mg Oral Daily  . pantoprazole  40 mg Oral Q1200  . potassium chloride  20 mEq Oral BID  . ramipril  10 mg Oral Daily  . rOPINIRole  0.25 mg Oral TID  . sodium chloride  3 mL Intravenous Q12H  . traZODone  150 mg Oral QHS  . triamterene-hydrochlorothiazide  1 tablet Oral Daily  . DISCONTD: furosemide  80 mg Intravenous Q12H    Continuous Infusions:      Patient Active Problem List   Diagnoses   .  CHF EF 60% 10/13/2009 (inadequate study?)-has classic CHF exacebr, cardiac markes x 3 are neg,   Echocardiogram = no specific indication of CHF, patient has a large habitus and likely this may be why we're not able to determine her diastolic parameters. I would treat her still as CHF. Put out over 3 L overnight and feeling better Change or Lasix 60 mg twice a day and then possibly 40 mg twice a day with close outpatient monitoring Discontinue diethylpropion, discontinue Naprosyn, discontinued Actos as it can contribute to possible CHF  Prefers to follow with Dr. Daleen Squibb as an outpatient as she has a relationship with him and trusts his judgment.    Marland Kitchen  DIABETES MELLITUS, TYPE II-last A1c 6.1 03/08/11-check CBG's. HOld oral meds, SSI-she has numerous intelligent questions regarding lactose use. I agree that this may be a contributor to her volume overload we will discontinue the same. I had a detailed discussion with her regarding the use of metformin which could be used in place of the weight suppressant that she's been placed on by one of her other doctors. We will definitely discontinue this as this can also be contributor. We will up titrate metformin 500 mg once daily hair and she may end of it from the weight loss potential at this . Not need daily monitoring his blood sugar has not required any coverage   .  B12 DEFICIENCY -stable, outpatient reevaluation of level   .  Unspecified vitamin D deficiency   .  HYPERLIPIDEMIA-MIXED-LDL=77, HDL 60-continue Lipitor 40 mg daily   .  htn-moderately controlled. Outpatient reevaluation and will replace on lower-dose PACe 5 mg prior to discharge depending on am BMP results  .  OBESITY, MORBID-patient is on appetite suppressant by the name of diastolic when necessary, which on review [place] does have significant side effects including tachycardia and can cause pulmonary  hypertension. I discontinued her off of this currently and she is instructed not to use this. Instead we will use the anorexia and and weight loss his metformin to our advantage help her lose weight   .  DEPRESSION-continue Lexapro 30 mg, Xanax 0.25 mg twice a day, Desyrel 150 mg 2 at bedtime   .  OBSTRUCTIVE SLEEP APNEA-conmtinue CPAP at ngiht per home settings   .  Adrenal mass, left-stable 4cm mass per MRI 2011   .  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 Bmet slightly elevated compared to prior. We will scale back on Lasix and  placed on by mouth and reevaluate in am  .  Atypical chest pain-non obstr cath 09/2009-stable, and no current CP.  As cardiac enzymes are negative and no specific long lasting pain, will reassess as an outpatient.     Code Status: Full Family Communication: Daughter is a Engineer, civil (consulting) and understands and was present for a conversation about possible CHF. Understands clear. Handouts given. Disposition Plan: Likely home in one to 2 days   Pleas Koch, MD  [facility] Pager [phone] 08/21/2011, 11:18 AM    LOS: 2 days

## 2011-08-22 DIAGNOSIS — I200 Unstable angina: Secondary | ICD-10-CM

## 2011-08-22 LAB — BASIC METABOLIC PANEL
CO2: 33 mEq/L — ABNORMAL HIGH (ref 19–32)
Chloride: 93 mEq/L — ABNORMAL LOW (ref 96–112)
Creatinine, Ser: 1.22 mg/dL — ABNORMAL HIGH (ref 0.50–1.10)

## 2011-08-22 LAB — GLUCOSE, CAPILLARY: Glucose-Capillary: 84 mg/dL (ref 70–99)

## 2011-08-22 LAB — CARDIAC PANEL(CRET KIN+CKTOT+MB+TROPI)
CK, MB: 1.2 ng/mL (ref 0.3–4.0)
CK, MB: 1.1 ng/mL (ref 0.3–4.0)
Relative Index: INVALID (ref 0.0–2.5)
Troponin I: <0.3 ng/mL (ref <0.30)

## 2011-08-22 MED ORDER — HEPARIN BOLUS VIA INFUSION
3000.0000 [IU] | Freq: Once | INTRAVENOUS | Status: AC
  Administered 2011-08-22: 3000 [IU] via INTRAVENOUS
  Filled 2011-08-22: qty 3000

## 2011-08-22 MED ORDER — HEPARIN (PORCINE) IN NACL 100-0.45 UNIT/ML-% IJ SOLN
1350.0000 [IU]/h | INTRAMUSCULAR | Status: DC
  Administered 2011-08-22 – 2011-08-23 (×2): 1350 [IU]/h via INTRAVENOUS
  Filled 2011-08-22 (×3): qty 250

## 2011-08-22 MED ORDER — SODIUM CHLORIDE 0.9 % IV SOLN: INTRAVENOUS | Status: DC

## 2011-08-22 MED ORDER — SODIUM CHLORIDE 0.9 % IV SOLN: 250.0000 mL | INTRAVENOUS | Status: DC | PRN

## 2011-08-22 MED ORDER — SODIUM CHLORIDE 0.9 % IJ SOLN
3.0000 mL | Freq: Two times a day (BID) | INTRAMUSCULAR | Status: DC
  Administered 2011-08-22: 3 mL via INTRAVENOUS

## 2011-08-22 MED ORDER — SODIUM CHLORIDE 0.9 % IJ SOLN: 3.0000 mL | INTRAMUSCULAR | Status: DC | PRN

## 2011-08-22 MED ORDER — ASPIRIN 81 MG PO CHEW
324.0000 mg | CHEWABLE_TABLET | ORAL | Status: AC
  Administered 2011-08-23: 324 mg via ORAL
  Filled 2011-08-22: qty 4

## 2011-08-22 MED ORDER — DIAZEPAM 5 MG PO TABS
5.0000 mg | ORAL_TABLET | ORAL | Status: AC
  Administered 2011-08-23: 5 mg via ORAL
  Filled 2011-08-22: qty 1

## 2011-08-22 NOTE — Progress Notes (Signed)
Pt c/o c/p on upper Rt & Lt side of chest. P 66, BP 120/60, 02  sats 94% on RA.  Dr. Mahala Menghini in room to see Pt.   Aware of VS and ordered EKG.  NT notified and beng done at this time.  Will cont. To monitor.   Amanda Pea, Charity fundraiser.

## 2011-08-22 NOTE — Progress Notes (Signed)
CARDIOLOGY CONSULT NOTE  Patient ID: [name] MRN: [id number] DOB/AGE: 60-29-1952 60 y.o.  Admit date: 08/19/2011 Referring Physician: Dr Mahala Menghini Primary Physician: Dr Sharen Hones Primary Cardiologist: Dr Daleen Squibb Reason for Consultation: Chest pain  HPI: 61 year-old woman with history of nonobstructive CAD by cath in 2011 presents now with chest pain, dyspnea, and edema. Edema and dyspnea have both improved with diuresis. However, she continues to have SSCP described as a 'pressure' with movement around the room or any significant exertion. This occurred last night, this morning, and again when she showered today. The pain radiates to the jaw, and resolves slowly with rest. She c/o associated dyspnea. Denies diaphoresis, orthopnea, or PND. Leg swelling is much better. She has no other complaints.   The patient is a former smoker. Her cholesterol has been well-treated. She has a strong family history of CAD - brother had first MI at 106 and died at 87, mother had extensive CAD and many stents.  Past Medical History  Diagnosis Date  . Vitamin B 12 deficiency   . Anxiety   . Depression   . Hyperlipidemia   . Hypothyroid   . CAD (coronary artery disease)     nonobstructive plaque  . Obesity     morbid obesity  . T2DM (type 2 diabetes mellitus)   . OSA (obstructive sleep apnea)   . Vitamin d deficiency     last check 48 (11/2010)  . Fibula fracture      Past Surgical History  Procedure Date  . Osteotomy and ulnar shortening   . Knee surgery 11-02-2008    Partial medical and Lat Menisectomies and Condroplasty (Dr. Thurston Hole)  . Cardiac catheterization 10/12/09    EF 70%  . Mr abdomen 01/16/2011    Left 4cm adrenal myelolipoma     Family History  Problem Relation Age of Onset  . Coronary artery disease Mother   . Stroke Mother   . Diabetes Mother   . Heart attack Father   . Heart attack Brother     Social History: History   Social History  . Marital Status: Legally  Separated    Spouse Name: N/A    Number of Children: 1  . Years of Education: N/A   Occupational History  . Nature conservation officer    Social History Main Topics  . Smoking status: Former Smoker    Quit date: 08/09/1998  . Smokeless tobacco: Not on file  . Alcohol Use: No  . Drug Use: No  . Sexually Active: No   Other Topics Concern  . Not on file   Social History Narrative   Past History: Past Medical History:Last updated: 07/13/2011B12 DeficiencyAnxiety DisorderCoronary Artery DiseaseDepressionDiabetesHyperlipidemiaHypothyroidismObesity Past Surgical History:Last updated: 06/05/2011NSVD x1Persantine Cardiolite- wnl 97- 02/00Ulnar osteotomy  ([name]), negative wrist 10/01Colonoscopy 03/30/06Abd. U/S wnl except fatty liver 03/06Adenosine Myoview nml 4/7/09L Knee Partial medial and Lat Menisectomies and Condroplasty (Dr Thurston Hole) 6/21/10HOSP CP R/O'd Nonobstruct CAD by Cath  5/27-6/1/2011CATH and ECHO Nonobstr CAD  EF 60% 5/31/2011HOSP CP R/O'd Cath w/ CAD EF 60% 5/27-10/13/2009 Family History:Last updated: 12-06-10Father: Died  at age 28 of a heart attackMother: Died at age 57 with coronary disease, S/P 14 angioplasties and 5-vessel              coronary artery bypass grafting,  she also had a stroke and diabetesBrother died at the age of 48 of a heart attack, he had his first heart  attack at 80 years of age.  Sister A 74     Prescriptions prior to admission  Medication Sig Dispense Refill  . ALPRAZolam (XANAX) 0.25 MG tablet Take 0.25 mg by mouth 2 (two) times daily.      Marland Kitchen atorvastatin (LIPITOR) 40 MG tablet Take 40 mg by mouth daily.      . cetirizine (ZYRTEC) 10 MG tablet Take 10 mg by mouth daily.      . cholecalciferol (VITAMIN D) 1000 UNITS tablet Take 1,000 Units by mouth daily.        . Diethylpropion HCl 75 MG TB24 Take 1 tablet by mouth daily. For weight loss      . escitalopram (LEXAPRO) 20 MG tablet Take 30 mg by mouth daily.        . furosemide (LASIX) 20 MG tablet  Take 20 mg by mouth 2 (two) times daily.      Marland Kitchen glyBURIDE-metformin (GLUCOVANCE) 1.25-250 MG per tablet Take 1 tablet by mouth 2 (two) times daily with a meal.      . lamoTRIgine (LAMICTAL) 100 MG tablet Take 150 mg by mouth daily.       Marland Kitchen levothyroxine (SYNTHROID, LEVOTHROID) 200 MCG tablet Take 200 mcg by mouth daily.      . naproxen (EC NAPROSYN) 500 MG EC tablet Take 500 mg by mouth 2 (two) times daily with a meal.      . naproxen (NAPROSYN) 500 MG tablet Take 1 tablet (500 mg total) by mouth 2 (two) times daily with a meal.  60 tablet  3  . omeprazole (PRILOSEC) 40 MG capsule Take 40 mg by mouth daily.        . pioglitazone (ACTOS) 30 MG tablet Take 1 tablet (30 mg total) by mouth daily.  30 tablet  6  . pioglitazone (ACTOS) 30 MG tablet Take 30 mg by mouth daily.      . ramipril (ALTACE) 10 MG tablet Take 10 mg by mouth daily.      Marland Kitchen rOPINIRole (REQUIP) 0.25 MG tablet Take 0.25 mg by mouth 3 (three) times daily.      Marland Kitchen rOPINIRole (REQUIP) 0.25 MG tablet Take 0.25 mg by mouth at bedtime.      . traZODone (DESYREL) 50 MG tablet Take 150 mg by mouth at bedtime.      . triamterene-hydrochlorothiazide (MAXZIDE) 75-50 MG per tablet Take 1 tablet by mouth daily.      Marland Kitchen NEEDLE, DISP, 25 G (MONOJECT MAGELLAN SAFETY NDL) 25G X 1" MISC by Does not apply route as directed.          ROS: General: no fevers/chills/night sweats Eyes: no blurry vision, diplopia, or amaurosis ENT: no sore throat or hearing loss Resp: no cough, wheezing, or hemoptysis CV: no palpitations, otherwise see HPI GI: no abdominal pain, nausea, vomiting, diarrhea, or constipation GU: no dysuria, frequency, or hematuria Skin: no rash Neuro: no headache, numbness, tingling, or weakness of extremities Musculoskeletal: no joint pain or swelling Heme: no bleeding, DVT, or easy bruising Endo: no polydipsia or polyuria    Physical Exam: Blood pressure 119/58, pulse 63, temperature 98.2 F (36.8 C), temperature source Oral,  resp. rate 18, height 5\' 6"  (1.676 m), weight 148.508 kg (327 lb 6.4 oz), SpO2 95.00%.  Pt is alert and oriented, very pleasant morbidly obese woman in no distress. HEENT: normal Neck: JVP normal. Carotid upstrokes normal without bruits. No thyromegaly. Lungs: equal expansion, clear bilaterally CV: Apex is discrete and nondisplaced, RRR without murmur or  gallop Abd: soft, NT, +BS, no bruit, no hepatosplenomegaly Back: no CVA tenderness Ext: no C/C/E        Femoral pulses 2+= without bruits        DP/PT pulses intact and = Skin: warm and dry without rash Neuro: CNII-XII intact             Strength intact = bilaterally  Labs:   Lab Results  Component Value Date   WBC 7.8 08/20/2011   HGB 11.7* 08/20/2011   HCT 36.5 08/20/2011   MCV 91.5 08/20/2011   PLT 235 08/20/2011    Lab 08/22/11 0510 08/19/11 2148  NA 137 --  K 3.5 --  CL 93* --  CO2 33* --  BUN 38* --  CREATININE 1.22* --  CALCIUM 9.7 --  PROT -- 7.0  BILITOT -- 0.4  ALKPHOS -- 74  ALT -- 15  AST -- 17  GLUCOSE 100* --   Lab Results  Component Value Date   CKTOTAL 40 08/22/2011   CKMB 1.2 08/22/2011   TROPONINI <0.30 08/22/2011    Lab Results  Component Value Date   CHOL  Value: 166        ATP III CLASSIFICATION:  <200     mg/dL   Desirable  161-096  mg/dL   Borderline High  >=045    mg/dL   High        08/21/8117   CHOL 148 07/15/2009   CHOL 157 04/06/2009   Lab Results  Component Value Date   HDL 60 08/17/2010   HDL 51 10/09/2009   HDL 63.70 07/15/2009   Lab Results  Component Value Date   LDLCALC 77 08/17/2010   LDLCALC  Value: 83        Total Cholesterol/HDL:CHD Risk Coronary Heart Disease Risk Table                     Men   Women  1/2 Average Risk   3.4   3.3  Average Risk       5.0   4.4  2 X Average Risk   9.6   7.1  3 X Average Risk  23.4   11.0        Use the calculated Patient Ratio above and the CHD Risk Table to determine the patient's CHD Risk.        ATP III CLASSIFICATION (LDL):  <100     mg/dL   Optimal  147-829   mg/dL   Near or Above                    Optimal  130-159  mg/dL   Borderline  562-130  mg/dL   High  >865     mg/dL   Very High 7/84/6962   LDLCALC 59 07/15/2009   Lab Results  Component Value Date   TRIG 108 08/17/2010   TRIG 160* 10/09/2009   TRIG 125.0 07/15/2009   Lab Results  Component Value Date   CHOLHDL 3.3 10/09/2009   CHOLHDL 2 07/15/2009   CHOLHDL 3 04/06/2009   No results found for this basename: LDLDIRECT      Radiology: No results found.  EKG: NSR 70 bpm, WNL  ASSESSMENT AND PLAN:  1. Chest pain concerning for unstable angina 2. Acute diastolic heart failure 3. Type 2 DM 4. Morbid obesity  The patient has concerning symptoms now with 2 days of chest pressure radiating to jaw with minimal activity such as taking  a shower. Even though her objective markers are negative, I would favor proceeding with cardiac cath. She has a very strong risk profile with obesity, diabetes, and strong family history of premature CAD. Her body habitus makes noninvasive testing of limited utility. I have reviewed risks, indication, and alternatives to cath and possible PCI with the patient - she understands and agrees to proceed. Specific risks include but not limited to stroke, MI, bleeding, and death. I am going to start her on IV heparin and will plan right radial approach for cath.  Tonny Bollman 08/22/2011, 2:42 PM

## 2011-08-22 NOTE — Progress Notes (Signed)
PROGRESS NOTE  [name] JXB:[id number] DOB: 11-30-1950 DOA: 08/19/2011 PCP: Eustaquio Boyden, MD, MD  Brief narrative: 61 year old Caucasian female admitted with likely CHF exacerbation  Past medical history: well-controlled diabetes mellitus, B12 deficiency, hyperlipidemia-LDL 77, morbid obesity, obstructive sleep apnea, adrenal mass 4 cm per MRI 2000, history atypical chest pain, CKD stage I to 2, super morbid obesity, depression, vitamin D deficiency,? Pulmonary hypertension,  Consultants:  None  Procedures:  Chest x-ray 4/6 = thoracic spondylosis, equal focal cephalization blood flow pressure? mild pulmonary venous hypertension, no overt edema  2-D echo 4/7 = EF 55-60%, indeterminant diastolic function, no regional wall motion abnormalities with? Right PA pressure 6-10 mmHg  Antibiotics:  None   Subjective  Patient complains of chest pain this morning associated with headache. Patient states is radiation of pain into her neck Patient states is some tingling down her left arm. She states that she had these issues on and off for the past week She states that her headaches and her chest pain related Her chest pain was continuous last night lasted for about half an hour with some relief on its own. Tylenol was given and this only helped the headache that came along with it. She does not look as well as she did yesterday.   Objective   Interim History: Chart reviewed and nursing notes reviewed  Objective: Filed Vitals:   08/21/11 1812 08/21/11 2154 08/21/11 2243 08/22/11 0552  BP: 92/55 111/48 98/60 119/58  Pulse: 67 61  63  Temp:  98.2 F (36.8 C)  98.2 F (36.8 C)  TempSrc:  Oral  Oral  Resp: 20 20  18   Height:      Weight:    148.508 kg (327 lb 6.4 oz)  SpO2: 96% 96%  95%    Intake/Output Summary (Last 24 hours) at 08/22/11 0920 Last data filed at 08/22/11 0019  Gross per 24 hour  Intake   1323 ml  Output   1650 ml  Net   -327 ml     Exam:  HEENT-morbidly obese CF in NAD CHEST-clinically clear, diminshed BS's, no rales, no TVR/TVF. No reproducible chest pain on pressure  CARDIAC-s1 s2 no m/r/g, NO JVD, rrr, Non-tele ABDOMEN-obese nt/nd  NEURO-intact  SKIN/MUSCULAR-no specificacute muscle findings, Has trace edema, decreased from yesterday  Data Reviewed: Basic Metabolic Panel:  Lab 08/22/11 1308 08/21/11 0224 08/20/11 0307 08/19/11 2148  NA 137 139 -- 140  K 3.5 3.6 -- --  CL 93* 98 -- 101  CO2 33* 29 -- 29  GLUCOSE 100* 102* -- 159*  BUN 38* 35* -- 30*  CREATININE 1.22* 1.12* 1.04 1.06  CALCIUM 9.7 9.4 -- 9.3  MG -- -- 2.0 --  PHOS -- -- -- --   Liver Function Tests:  Lab 08/19/11 2148  AST 17  ALT 15  ALKPHOS 74  BILITOT 0.4  PROT 7.0  ALBUMIN 3.7   No results found for this basename: LIPASE:5,AMYLASE:5 in the last 168 hours No results found for this basename: AMMONIA:5 in the last 168 hours CBC:  Lab 08/20/11 0307 08/19/11 2148  WBC 7.8 6.5  NEUTROABS -- 4.5  HGB 11.7* 11.5*  HCT 36.5 36.5  MCV 91.5 92.6  PLT 235 217   Cardiac Enzymes:  Lab 08/21/11 0216 08/20/11 1653 08/20/11 1000 08/20/11 0307 08/19/11 2149  CKTOTAL 60 71 75 72 75  CKMB 1.4 1.5 1.6 1.7 1.8  CKMBINDEX -- -- -- -- --  TROPONINI <0.30 <0.30 <0.30 <0.30 --   BNP:  No components found with this basename: POCBNP:5 CBG:  Lab 08/22/11 0632 08/21/11 2151 08/21/11 1722 08/21/11 1132 08/21/11 0624  GLUCAP 110* 125* 94 98 91    No results found for this or any previous visit (from the past 240 hour(s)).   Studies:              All Imaging reviewed and is as per above notation   Scheduled Meds:    . ALPRAZolam  0.25 mg Oral BID  . aspirin EC  81 mg Oral Daily  . atorvastatin  40 mg Oral q1800  . enoxaparin  40 mg Subcutaneous Q24H  . escitalopram  30 mg Oral Daily  . furosemide  40 mg Oral BID  . insulin aspart  0-20 Units Subcutaneous TID WC  . lamoTRIgine  150 mg Oral Daily  . levothyroxine  200 mcg  Oral QAC breakfast  . loratadine  10 mg Oral Daily  . metFORMIN  500 mg Oral Q breakfast  . pantoprazole  40 mg Oral Q1200  . potassium chloride  20 mEq Oral BID  . ramipril  10 mg Oral Daily  . rOPINIRole  0.25 mg Oral TID  . sodium chloride  3 mL Intravenous Q12H  . traZODone  150 mg Oral QHS  . triamterene-hydrochlorothiazide  1 tablet Oral Daily  . DISCONTD: furosemide  80 mg Intravenous Q12H   Continuous Infusions:      Patient Active Problem List     Diagnoses        chest pain-unclear etiology. I am getting a stat EKG and cardiac markers x3.  Will place patient back on telemetry. Will ask cardiology to consult regarding the same. Patient had a cardiac catheterization in 09/2009 which showed nonobstructive cardiac disease. Further disposition as per cardiologist who has been consulted, appreciate input in advance. I do not feel specifically that is cardiogenic but feel it is important to rule out if her pain does not get better and if EKG and enzymes become elevated  EKG=NSr, PR=0.18, QRS axis 80 degrees, no acute T-wave changes from 4/7 13  .  CHF EF Prior  60% 10/13/2009 (inadequate study?)-has classic CHF exacerb, cardiac markes x 3 are neg,   Echocardiogram 4/7 = no specific indication of CHF, patient has a large habitus and likely this may be why we're not able to determine her diastolic parameters. I would treat her still as CHF. Put out over 3 L overnight and feeling better Change or Lasix 60 mg twice a day and then possibly 40 mg twice a day with close outpatient monitoring Discontinue diethylpropion, discontinue Naprosyn, discontinued Actos as it can contribute to possible CHF     .  DIABETES MELLITUS, TYPE II-last A1c 6.1 03/08/11-check CBG's. HOld oral meds, SSI-she has numerous intelligent questions regarding Weight loss and Actos use.  I agree that this may be a contributor to her volume overload we will discontinue the same. I had a detailed discussion with her regarding  the use of metformin which could be used in place of the weight suppressant that she's been placed on by one of her other doctors. We will definitely discontinue this as this can also be contributor.  We will up titrate metformin 500 mg once daily hair and she may end of it from the weight loss potential at this. Not need daily monitoring his blood sugar has not required any coverage.   .  B12 DEFICIENCY -stable, outpatient reevaluation of level   .  Unspecified vitamin  D deficiency   .  HYPERLIPIDEMIA-MIXED-LDL=77, HDL 60-continue Lipitor 40 mg daily   .  htn-moderately controlled. Outpatient reevaluation and will replace on lower-dose ACE 5 mg prior to discharge depending on am BMP results  .  OBESITY, MORBID-patient was on appetite suppressant by the name of Diethylpropion when necessary, which on [place] does have significant side effects including tachycardia and can cause pulmonary hypertension. I discontinued her off of this currently and she is instructed not to use this. Instead we will use the anorexia and and weight loss his metformin to our advantage help her lose weight     Acute Kidney Injury-patient was on high-dose IV Lasix 80 mg twice a day which was scaled back yesterday. She also had some mild hypotension and was on ramipril 20 mg as well as Dyazide.  We will discontinue her ACE inhibitor stop her Dyazide diuretic and recheck labs in the morning.  I would hold IVF for now unless her renal insufficiency worsens in the am  .  DEPRESSION-continue Lexapro 30 mg, Xanax 0.25 mg twice a day, Desyrel 150 mg 2 at bedtime   .  OBSTRUCTIVE SLEEP APNEA-conmtinue CPAP at ngiht per home settings   .  Adrenal mass, left-stable 4cm mass per MRI 2011   .    Marland Kitchen  Atypical chest pain-non obstr cath 09/2009-stable, and no current CP.  As cardiac enzymes are negative and no specific long lasting pain, will reassess as an outpatient.     Code Status: Full Family Communication: Daughter is a Engineer, civil (consulting)  and understands and was present for a conversation about possible CHF. Understands clear. Handouts given. Disposition Plan: Likely home in one to 2 days   Pleas Koch, MD  [facility] Pager [phone] 08/22/2011, 9:20 AM    LOS: 3 days

## 2011-08-22 NOTE — Progress Notes (Signed)
   CARE MANAGEMENT NOTE 08/22/2011  Patient:  [name], [name]   Account Number:  [id number]  Date Initiated:  08/21/2011  Documentation initiated by:  Onnie Boer  Subjective/Objective Assessment:   PT WAS ADMITTED WITH DYSPNEA AND EXTREMITY SWELLING     Action/Plan:   PROGRESSION OF CARE AND DISCHARGE PLANNING   Anticipated DC Date:     Anticipated DC Plan:  HOME/SELF CARE      DC Planning Services  CM consult      Choice offered to / List presented to:             Status of service:  In process, will continue to follow Medicare Important Message given?   (If response is "NO", the following Medicare IM given date fields will be blank) Date Medicare IM given:   Date Additional Medicare IM given:    Discharge Disposition:    Per UR Regulation:  Reviewed for med. necessity/level of care/duration of stay  If discussed at Long Length of Stay Meetings, dates discussed:    Comments:   08/21/2011 1200 Darlyne Russian RN, [place] [phone] Met with patient and daughter regarding discharge planning. The patient reports no problem with transportation to outpatient appointments. She would like to speak with a nutritionist regarding her diet for diabetes and CHF.  CM to continue to follow for discharge planning needs.  08/21/11 Onnie Boer, RN, BSN [phone] UR COMPLETED

## 2011-08-22 NOTE — Progress Notes (Signed)
ANTICOAGULATION CONSULT NOTE - Initial Consult  Pharmacy Consult for Heparin Indication: chest pain/ACS/unstable angina  Allergies  Allergen Reactions  . Erythromycin Base     REACTION: Vomitting  . Neomycin     vomiting     Patient Measurements: Height: 5\' 6"  (167.6 cm) (Stated) Weight: 327 lb 6.4 oz (148.508 kg) (a scale) IBW/kg (Calculated) : 59.3  Heparin Dosing Weight: 96.2 kg  Vital Signs: Temp: 97.6 F (36.4 C) (04/09 1500) Temp src: Oral (04/09 1500) BP: 116/57 mmHg (04/09 1500) Pulse Rate: 63  (04/09 1500)  Labs:  Basename 08/22/11 1000 08/22/11 0510 08/21/11 0224 08/21/11 0216 08/20/11 1653 08/20/11 0307 08/19/11 2148  HGB -- -- -- -- -- 11.7* 11.5*  HCT -- -- -- -- -- 36.5 36.5  PLT -- -- -- -- -- 235 217  APTT -- -- -- -- -- -- --  LABPROT -- -- -- -- -- -- --  INR -- -- -- -- -- -- --  HEPARINUNFRC -- -- -- -- -- -- --  CREATININE -- 1.22* 1.12* -- -- 1.04 --  CKTOTAL 40 -- -- 60 71 -- --  CKMB 1.2 -- -- 1.4 1.5 -- --  TROPONINI <0.30 -- -- <0.30 <0.30 -- --   Estimated Creatinine Clearance: 74.5 ml/min (by C-G formula based on Cr of 1.22).  Medical History: Past Medical History  Diagnosis Date  . Vitamin B 12 deficiency   . Anxiety   . Depression   . Hyperlipidemia   . Hypothyroid   . CAD (coronary artery disease)     nonobstructive plaque  . Obesity     morbid obesity  . T2DM (type 2 diabetes mellitus)   . OSA (obstructive sleep apnea)   . Vitamin d deficiency     last check 47 (11/2010)  . Fibula fracture     Medications:  Scheduled:    . ALPRAZolam  0.25 mg Oral BID  . aspirin  324 mg Oral Pre-Cath  . aspirin EC  81 mg Oral Daily  . atorvastatin  40 mg Oral q1800  . diazepam  5 mg Oral On Call  . enoxaparin  40 mg Subcutaneous Q24H  . escitalopram  30 mg Oral Daily  . furosemide  40 mg Oral BID  . insulin aspart  0-20 Units Subcutaneous TID WC  . lamoTRIgine  150 mg Oral Daily  . levothyroxine  200 mcg Oral QAC breakfast  .  loratadine  10 mg Oral Daily  . metFORMIN  500 mg Oral Q breakfast  . pantoprazole  40 mg Oral Q1200  . potassium chloride  20 mEq Oral BID  . rOPINIRole  0.25 mg Oral TID  . sodium chloride  3 mL Intravenous Q12H  . sodium chloride  3 mL Intravenous Q12H  . traZODone  150 mg Oral QHS  . DISCONTD: ramipril  10 mg Oral Daily  . DISCONTD: triamterene-hydrochlorothiazide  1 tablet Oral Daily   NOTE: Pt received Lovenox 40 mg SQ this morning ~ 1045 AM.  Assessment: 61 yo F with hx nonobstructive CAD presents with CP, SOB, and edema.  Cardiac enzymes have been negative however chest "pressure" still persists with mild activity.  Plan for cardiac cath.  Goal of Therapy:  Heparin level 0.3-0.7 units/ml   Plan:  D/C Lovenox. Heparin 3000 unit IV bolus x 1 (reduced bolus with recent Lovenox administration) Heparin infusion at 1350 units/hr. Heparin level in 6 hours. Heparin level and CBC daily while on heparin. Follow-up plans for cardiac cath.  [employer], Pharm.D., BCPS Clinical Pharmacist Pager [phone] 08/22/2011 3:48 PM

## 2011-08-23 ENCOUNTER — Encounter (HOSPITAL_COMMUNITY)
Admission: AD | Disposition: A | Discharge status: Home / Self Care | Payer: Self-pay | Source: Home / Self Care | Attending: Family Medicine

## 2011-08-23 ENCOUNTER — Encounter: Payer: Self-pay | Admitting: Family Medicine

## 2011-08-23 DIAGNOSIS — I509 Heart failure, unspecified: Secondary | ICD-10-CM

## 2011-08-23 DIAGNOSIS — I2510 Atherosclerotic heart disease of native coronary artery without angina pectoris: Secondary | ICD-10-CM

## 2011-08-23 HISTORY — PX: CARDIAC CATHETERIZATION: SHX172

## 2011-08-23 HISTORY — PX: LEFT HEART CATHETERIZATION WITH CORONARY ANGIOGRAM: SHX5451

## 2011-08-23 LAB — CBC
HCT: 41 % (ref 36.0–46.0)
Hemoglobin: 13 g/dL (ref 12.0–15.0)
MCH: 29.2 pg (ref 26.0–34.0)
MCH: 29.5 pg (ref 26.0–34.0)
MCV: 92.1 fL (ref 78.0–100.0)
Platelets: 228 10*3/uL (ref 150–400)
Platelets: 214 10*3/uL (ref 150–400)
RBC: 4.45 MIL/uL (ref 3.87–5.11)
RBC: 4.61 MIL/uL (ref 3.87–5.11)
WBC: 6 10*3/uL (ref 4.0–10.5)

## 2011-08-23 LAB — HEPARIN LEVEL (UNFRACTIONATED): Heparin Unfractionated: 0.52 IU/mL (ref 0.30–0.70)

## 2011-08-23 LAB — GLUCOSE, CAPILLARY
Glucose-Capillary: 116 mg/dL — ABNORMAL HIGH (ref 70–99)
Glucose-Capillary: 132 mg/dL — ABNORMAL HIGH (ref 70–99)
Glucose-Capillary: 151 mg/dL — ABNORMAL HIGH (ref 70–99)

## 2011-08-23 LAB — CREATININE, SERUM
Creatinine, Ser: 1.14 mg/dL — ABNORMAL HIGH (ref 0.50–1.10)
GFR calc non Af Amer: 52 mL/min — ABNORMAL LOW (ref >90)

## 2011-08-23 LAB — D-DIMER, QUANTITATIVE: D-Dimer, Quant: 0.33 ug/mL-FEU (ref 0.00–0.48)

## 2011-08-23 LAB — BASIC METABOLIC PANEL
BUN: 36 mg/dL — ABNORMAL HIGH (ref 6–23)
CO2: 33 mEq/L — ABNORMAL HIGH (ref 19–32)
GFR calc non Af Amer: 52 mL/min — ABNORMAL LOW (ref >90)
Glucose, Bld: 112 mg/dL — ABNORMAL HIGH (ref 70–99)
Potassium: 3.2 mEq/L — ABNORMAL LOW (ref 3.5–5.1)

## 2011-08-23 LAB — PROTIME-INR
INR: 0.97 (ref 0.00–1.49)
Prothrombin Time: 13.1 seconds (ref 11.6–15.2)

## 2011-08-23 SURGERY — LEFT HEART CATHETERIZATION WITH CORONARY ANGIOGRAM
Anesthesia: LOCAL

## 2011-08-23 MED ORDER — HEPARIN SODIUM (PORCINE) 1000 UNIT/ML IJ SOLN
INTRAMUSCULAR | Status: AC
  Filled 2011-08-23: qty 1

## 2011-08-23 MED ORDER — ACETAMINOPHEN 325 MG PO TABS
650.0000 mg | ORAL_TABLET | ORAL | Status: DC | PRN
  Administered 2011-08-23: 650 mg via ORAL
  Filled 2011-08-23: qty 1

## 2011-08-23 MED ORDER — SODIUM CHLORIDE 0.9 % IV SOLN: INTRAVENOUS | Status: AC

## 2011-08-23 MED ORDER — ONDANSETRON HCL 4 MG/2ML IJ SOLN: 4.0000 mg | Freq: Four times a day (QID) | INTRAMUSCULAR | Status: DC | PRN

## 2011-08-23 MED ORDER — MIDAZOLAM HCL 2 MG/2ML IJ SOLN
INTRAMUSCULAR | Status: AC
  Filled 2011-08-23: qty 2

## 2011-08-23 MED ORDER — NITROGLYCERIN 0.2 MG/ML ON CALL CATH LAB
INTRAVENOUS | Status: AC
  Filled 2011-08-23: qty 1

## 2011-08-23 MED ORDER — FENTANYL CITRATE 0.05 MG/ML IJ SOLN
INTRAMUSCULAR | Status: AC
  Filled 2011-08-23: qty 2

## 2011-08-23 MED ORDER — HEPARIN (PORCINE) IN NACL 2-0.9 UNIT/ML-% IJ SOLN
INTRAMUSCULAR | Status: AC
  Filled 2011-08-23: qty 2000

## 2011-08-23 MED ORDER — LIDOCAINE HCL (PF) 1 % IJ SOLN
INTRAMUSCULAR | Status: AC
  Filled 2011-08-23: qty 30

## 2011-08-23 MED ORDER — HYDROCORTISONE 1 % EX CREA
TOPICAL_CREAM | Freq: Three times a day (TID) | CUTANEOUS | Status: DC | PRN
  Administered 2011-08-23: 1 via TOPICAL
  Filled 2011-08-23: qty 28

## 2011-08-23 MED ORDER — ENOXAPARIN SODIUM 40 MG/0.4ML ~~LOC~~ SOLN
40.0000 mg | SUBCUTANEOUS | Status: DC
  Administered 2011-08-23: 40 mg via SUBCUTANEOUS
  Filled 2011-08-23 (×2): qty 0.4

## 2011-08-23 MED ORDER — POTASSIUM CHLORIDE CRYS ER 20 MEQ PO TBCR
20.0000 meq | EXTENDED_RELEASE_TABLET | Freq: Two times a day (BID) | ORAL | Status: DC
  Administered 2011-08-23 (×2): 20 meq via ORAL
  Filled 2011-08-23 (×2): qty 1

## 2011-08-23 NOTE — Progress Notes (Signed)
ANTICOAGULATION CONSULT NOTE  Pharmacy Consult for Heparin Indication: chest pain/ACS/unstable angina  Allergies  Allergen Reactions  . Erythromycin Base     REACTION: Vomitting  . Neomycin     vomiting     Patient Measurements: Height: 5\' 6"  (167.6 cm) (Stated) Weight: 327 lb 6.4 oz (148.508 kg) (a scale) IBW/kg (Calculated) : 59.3  Heparin Dosing Weight: 96.2 kg  Vital Signs: Temp: 98.5 F (36.9 C) (04/09 2049) Temp src: Oral (04/09 2049) BP: 101/63 mmHg (04/09 2049) Pulse Rate: 67  (04/09 2049)  Labs:  Basename 08/22/11 2320 08/22/11 1704 08/22/11 1000 08/22/11 0510 08/21/11 0224 08/21/11 0216 08/20/11 0307  HGB -- -- -- -- -- -- 11.7*  HCT -- -- -- -- -- -- 36.5  PLT -- -- -- -- -- -- 235  APTT -- -- -- -- -- -- --  LABPROT -- -- -- -- -- -- --  INR -- -- -- -- -- -- --  HEPARINUNFRC 0.52 -- -- -- -- -- --  CREATININE -- -- -- 1.22* 1.12* -- 1.04  CKTOTAL -- 40 40 -- -- 60 --  CKMB -- 1.1 1.2 -- -- 1.4 --  TROPONINI -- <0.30 <0.30 -- -- <0.30 --   Estimated Creatinine Clearance: 74.5 ml/min (by C-G formula based on Cr of 1.22).  Assessment: 61 yo F with chest pain for Heparin  Goal of Therapy:  Heparin level 0.3-0.7 units/ml   Plan:  Continue Heparin at current rate Follow-up am labs.  Geannie Risen, PharmD, BCPS  08/23/2011 12:40 AM

## 2011-08-23 NOTE — CV Procedure (Signed)
   Cardiac Catheterization Procedure Note  Name: [name] MRN: [id number] DOB: Sep 29, 1950  Procedure: Left Heart Cath, Selective Coronary Angiography, LV angiography  Indication: Chest pain concerning for unstable angina   Procedural Details: The right wrist was prepped, draped, and anesthetized with 1% lidocaine. Using the modified Seldinger technique, a 5 French sheath was introduced into the right radial artery. 2.5 mg of nicardipine was administered through the sheath, weight-based unfractionated heparin was administered intravenously. Standard Judkins catheters were used for selective coronary angiography and left ventriculography. Catheter exchanges were performed over an exchange length guidewire. There were no immediate procedural complications. A TR band was used for radial hemostasis at the completion of the procedure.  The patient was transferred to the post catheterization recovery area for further monitoring.  Procedural Findings: Hemodynamics: AO 106/57 with a mean of 79 LV 105/14  Coronary angiography: Coronary dominance: right  Left mainstem: The left main is widely patent. There is no obstructive disease present.  Left anterior descending (LAD): The left main is widely patent throughout the proximal portion. There are minor luminal irregularities present. There is a hinge point in the mid LAD with very minor nonobstructive disease present and a phasic appearance to the vessel. The first diagonal is of moderate to large caliber. Branches of the twin vessels. There is mild ostial stenosis of 30-50% present.  Left circumflex (LCx): The left circumflex is widely patent. There is no significant obstructive disease present. There is an intermediate branch with no stenosis. There are 2 obtuse marginals with no stenosis.  Right coronary artery (RCA): This is a large, dominant vessel. There is no significant obstructive disease throughout. The vessel gives off a PDA and  posterolateral branch with no stenosis.  Left ventriculography: Left ventricular systolic function is normal, LVEF is estimated at 55-65%, there is no significant mitral regurgitation   Final Conclusions:   1. Mild nonobstructive coronary artery disease as described above 2. Normal left ventricular function with normal LV filling pressure.  Recommendations: Even though the patient's chest pain is concerning, I don't see a significant coronary problem other than mild nonobstructive disease in the should not cause symptoms. Recommend check a d-dimer to rule out pulmonary embolus. The patient's BNP was elevated and she does have some evidence of diastolic heart failure, but her LV pressure is now normal. I would continue furosemide 40 mg daily for now, but I don't think she'll need this long-term. Her blood pressure is well-controlled and I don't see any other medical interventions to be done at this point. If her d-dimer is negative, she should be okay for discharge tomorrow.  Tonny Bollman 08/23/2011, 11:22 AM

## 2011-08-23 NOTE — Interval H&P Note (Signed)
History and Physical Interval Note:  08/23/2011 10:38 AM  [name]  has presented today for surgery, with the diagnosis of Chest pain  The various methods of treatment have been discussed with the patient and family. After consideration of risks, benefits and other options for treatment, the patient has consented to  Procedure(s) (LRB): LEFT HEART CATHETERIZATION WITH CORONARY ANGIOGRAM (N/A) as a surgical intervention .  The patients' history has been reviewed, patient examined, no change in status, stable for surgery.  I have reviewed the patients' chart and labs.  Questions were answered to the patient's satisfaction.     Tonny Bollman  08/23/2011 10:38 AM

## 2011-08-23 NOTE — Progress Notes (Signed)
Subjective: Patient seen and examined after returning from cath. Denies chest pain or SOB.  Objective:  Vital signs in last 24 hours:  Filed Vitals:   08/22/11 2049 08/23/11 0435 08/23/11 1025 08/23/11 1320  BP: 101/63 117/47  127/80  Pulse: 67 69 77 65  Temp: 98.5 F (36.9 C) 97.5 F (36.4 C)  97.4 F (36.3 C)  TempSrc: Oral   Oral  Resp: 18 18  18   Height:      Weight:  147.8 kg (325 lb 13.4 oz)    SpO2: 94% 92%  94%    Intake/Output from previous day:   Intake/Output Summary (Last 24 hours) at 08/23/11 1903 Last data filed at 08/23/11 1846  Gross per 24 hour  Intake      0 ml  Output   3325 ml  Net  -3325 ml    Physical Exam:  General: middle aged obese female in no acute distress. HEENT: no pallor, no icterus, moist oral mucosa, no JVD, no lymphadenopathy Heart: Normal  s1 &s2  Regular rate and rhythm, without murmurs, rubs, gallops. Lungs: Clear to auscultation bilaterally. Abdomen: Soft, nontender, nondistended, positive bowel sounds. Extremities: No clubbing cyanosis or edema with positive pedal pulses. Cath over rt radial, no bleeding or hematoma Neuro: Alert, awake, oriented x3, nonfocal.   Lab Results:  Basic Metabolic Panel:    Component Value Date/Time   NA 138 08/23/2011 0518   K 3.2* 08/23/2011 0518   K 3.5 08/17/2010   CL 93* 08/23/2011 0518   CO2 33* 08/23/2011 0518   BUN 36* 08/23/2011 0518   CREATININE 1.14* 08/23/2011 1455   GLUCOSE 112* 08/23/2011 0518   CALCIUM 9.6 08/23/2011 0518   CBC:    Component Value Date/Time   WBC 6.0 08/23/2011 1455   HGB 13.6 08/23/2011 1455   HCT 42.1 08/23/2011 1455   PLT 214 08/23/2011 1455   MCV 91.3 08/23/2011 1455   NEUTROABS 4.5 08/19/2011 2148   LYMPHSABS 1.4 08/19/2011 2148   MONOABS 0.3 08/19/2011 2148   EOSABS 0.3 08/19/2011 2148   BASOSABS 0.0 08/19/2011 2148    No results found for this or any previous visit (from the past 240 hour(s)).  Studies/Results: No results found.  Medications: Scheduled Meds:     . ALPRAZolam  0.25 mg Oral BID  . aspirin  324 mg Oral Pre-Cath  . aspirin EC  81 mg Oral Daily  . atorvastatin  40 mg Oral q1800  . diazepam  5 mg Oral On Call  . enoxaparin  40 mg Subcutaneous Q24H  . escitalopram  30 mg Oral Daily  . fentaNYL      . furosemide  40 mg Oral BID  . heparin      . heparin      . insulin aspart  0-20 Units Subcutaneous TID WC  . lamoTRIgine  150 mg Oral Daily  . levothyroxine  200 mcg Oral QAC breakfast  . lidocaine      . loratadine  10 mg Oral Daily  . midazolam      . midazolam      . nitroGLYCERIN      . pantoprazole  40 mg Oral Q1200  . potassium chloride  20 mEq Oral BID  . potassium chloride  20 mEq Oral BID  . rOPINIRole  0.25 mg Oral TID  . sodium chloride  3 mL Intravenous Q12H  . traZODone  150 mg Oral QHS  . DISCONTD: metFORMIN  500 mg Oral Q breakfast  .  DISCONTD: sodium chloride  3 mL Intravenous Q12H   Continuous Infusions:   . sodium chloride    . DISCONTD: sodium chloride 75 mL (08/23/11 0403)  . 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 Pulmonary hypertension, presented with chest pain with acute diastolic CHF   Chest pain  possibly atypical vs related to underlying CHF symptoms Serial CE negative Cardiac cath done today ( Dr cooper) shows mild nonobstructive CAD ( 30-50% ostial stenosis). Normal LVEF Cont ASA , statin D dimer negative Recommend secondary prevention and lifestyle modification  Acute diastolic CHF  patient presented  with acute CHF symptoms with elevated pro BNP Diuresed with IV lasix, now switched to po  normal LV filling pressure on cath today  DM  well controlled with A1C of 5.7  can start low dose metformin as outpt in next few days which can help her with weight loss as well  hypokalemia  replenished with kcl  hypothyroidism  cont synthroid  Depression cont home meds  DVT prophylaxis  Full code   can d/c home in am if stable overnight   LOS: 4 days   [name] 08/23/2011, 7:03 PM

## 2011-08-23 NOTE — Progress Notes (Signed)
Patient ID: [name], female   DOB: 02/22/1951, 61 y.o.   MRN: [id number] Chart reviewed. For cath today. K is 3.2. Will give extra now.

## 2011-08-23 NOTE — Progress Notes (Deleted)
   CARE MANAGEMENT NOTE 08/23/2011  Patient:  [name],[name]   Account Number:  [id number]  Date Initiated:  08/23/2011  Documentation initiated by:  Shemika Robbs  Subjective/Objective Assessment:   PT WAS ADMITTED WITH ISCHEMIC FOOT AND DEHYDRATION    Action/Plan:   PROGRESSION OF CARE AND DISCHARGE PLANNING  Anticipated DC Date:  08/28/2011   Anticipated DC Plan:  HOME W HOME HEALTH SERVICES      DC Planning Services CM consult     Choice offered to / List presented to:             Status of service:  In process, will continue to follow Medicare Important Message given?   (If response is "NO", the following Medicare IM given date fields will be blank) Date Medicare IM given:   Date Additional Medicare IM given:    Discharge Disposition:    Per UR Regulation:    If discussed at Long Length of Stay Meetings, dates discussed:    Comments:  08/23/11 Lucille Crichlow, RN, BSN 1520 PT IS TO HAVE SURG TOMORROW WILL F/U WITH PT/OT EVAL TO DETERMINE DISPOSISTION.    

## 2011-08-23 NOTE — Progress Notes (Signed)
Unable to obtain pt's frequent VS obtain after cath lab for (L) wrist cath as machine did not give recording.  Dr Excell Seltzer made aware and that once got from other machine were recorded.  Also dinner Carb Mod.  order and SL of IVF were obtained and ordered.  Pt up ambulating to bathroom without any problem.  Rt. Wrist site per cath with dsg. intact.  Will cont. To monitor.  Amanda Pea, RN

## 2011-08-23 NOTE — H&P (View-Only) (Signed)
 CARDIOLOGY CONSULT NOTE  Patient ID: [name] MRN: [id number] DOB/AGE: 05/24/1950 60 y.o.  Admit date: 08/19/2011 Referring Physician: Dr Samtani Primary Physician: Dr Gutierrez Primary Cardiologist: Dr Wall Reason for Consultation: Chest pain  HPI: 60 year-old woman with history of nonobstructive CAD by cath in 2011 presents now with chest pain, dyspnea, and edema. Edema and dyspnea have both improved with diuresis. However, she continues to have SSCP described as a 'pressure' with movement around the room or any significant exertion. This occurred last night, this morning, and again when she showered today. The pain radiates to the jaw, and resolves slowly with rest. She c/o associated dyspnea. Denies diaphoresis, orthopnea, or PND. Leg swelling is much better. She has no other complaints.   The patient is a former smoker. Her cholesterol has been well-treated. She has a strong family history of CAD - brother had first MI at 35 and died at 50, mother had extensive CAD and many stents.  Past Medical History  Diagnosis Date  . Vitamin B 12 deficiency   . Anxiety   . Depression   . Hyperlipidemia   . Hypothyroid   . CAD (coronary artery disease)     nonobstructive plaque  . Obesity     morbid obesity  . T2DM (type 2 diabetes mellitus)   . OSA (obstructive sleep apnea)   . Vitamin d deficiency     last check 48 (11/2010)  . Fibula fracture      Past Surgical History  Procedure Date  . Osteotomy and ulnar shortening   . Knee surgery 11-02-2008    Partial medical and Lat Menisectomies and Condroplasty (Dr. Wainer)  . Cardiac catheterization 10/12/09    EF 70%  . Mr abdomen 01/16/2011    Left 4cm adrenal myelolipoma     Family History  Problem Relation Age of Onset  . Coronary artery disease Mother   . Stroke Mother   . Diabetes Mother   . Heart attack Father   . Heart attack Brother     Social History: History   Social History  . Marital Status: Legally  Separated    Spouse Name: N/A    Number of Children: 1  . Years of Education: N/A   Occupational History  . operations manager    Social History Main Topics  . Smoking status: Former Smoker    Quit date: 08/09/1998  . Smokeless tobacco: Not on file  . Alcohol Use: No  . Drug Use: No  . Sexually Active: No   Other Topics Concern  . Not on file   Social History Narrative   Past History: Past Medical History:Last updated: 07/13/2011B12 DeficiencyAnxiety DisorderCoronary Artery DiseaseDepressionDiabetesHyperlipidemiaHypothyroidismObesity Past Surgical History:Last updated: 06/05/2011NSVD x1Persantine Cardiolite- wnl 97- 02/00Ulnar osteotomy  ([name]), negative wrist 10/01Colonoscopy 03/30/06Abd. U/S wnl except fatty liver 03/06Adenosine Myoview nml 4/7/09L Knee Partial medial and Lat Menisectomies and Condroplasty (Dr Wainer) 6/21/10HOSP CP R/O'd Nonobstruct CAD by Cath  5/27-6/1/2011CATH and ECHO Nonobstr CAD  EF 60% 5/31/2011HOSP CP R/O'd Cath w/ CAD EF 60% 5/27-10/13/2009 Family History:Last updated: 11/30/2010Father: Died  at age 58 of a heart attackMother: Died at age 78 with coronary disease, S/P 14 angioplasties and 5-vessel              coronary artery bypass grafting,  she also had a stroke and diabetesBrother died at the age of 55 of a heart attack, he had his first heart                attack at 40 years of age.  Sister A 64     Prescriptions prior to admission  Medication Sig Dispense Refill  . ALPRAZolam (XANAX) 0.25 MG tablet Take 0.25 mg by mouth 2 (two) times daily.      . atorvastatin (LIPITOR) 40 MG tablet Take 40 mg by mouth daily.      . cetirizine (ZYRTEC) 10 MG tablet Take 10 mg by mouth daily.      . cholecalciferol (VITAMIN D) 1000 UNITS tablet Take 1,000 Units by mouth daily.        . Diethylpropion HCl 75 MG TB24 Take 1 tablet by mouth daily. For weight loss      . escitalopram (LEXAPRO) 20 MG tablet Take 30 mg by mouth daily.        . furosemide (LASIX) 20 MG tablet  Take 20 mg by mouth 2 (two) times daily.      . glyBURIDE-metformin (GLUCOVANCE) 1.25-250 MG per tablet Take 1 tablet by mouth 2 (two) times daily with a meal.      . lamoTRIgine (LAMICTAL) 100 MG tablet Take 150 mg by mouth daily.       . levothyroxine (SYNTHROID, LEVOTHROID) 200 MCG tablet Take 200 mcg by mouth daily.      . naproxen (EC NAPROSYN) 500 MG EC tablet Take 500 mg by mouth 2 (two) times daily with a meal.      . naproxen (NAPROSYN) 500 MG tablet Take 1 tablet (500 mg total) by mouth 2 (two) times daily with a meal.  60 tablet  3  . omeprazole (PRILOSEC) 40 MG capsule Take 40 mg by mouth daily.        . pioglitazone (ACTOS) 30 MG tablet Take 1 tablet (30 mg total) by mouth daily.  30 tablet  6  . pioglitazone (ACTOS) 30 MG tablet Take 30 mg by mouth daily.      . ramipril (ALTACE) 10 MG tablet Take 10 mg by mouth daily.      . rOPINIRole (REQUIP) 0.25 MG tablet Take 0.25 mg by mouth 3 (three) times daily.      . rOPINIRole (REQUIP) 0.25 MG tablet Take 0.25 mg by mouth at bedtime.      . traZODone (DESYREL) 50 MG tablet Take 150 mg by mouth at bedtime.      . triamterene-hydrochlorothiazide (MAXZIDE) 75-50 MG per tablet Take 1 tablet by mouth daily.      . NEEDLE, DISP, 25 G (MONOJECT MAGELLAN SAFETY NDL) 25G X 1" MISC by Does not apply route as directed.          ROS: General: no fevers/chills/night sweats Eyes: no blurry vision, diplopia, or amaurosis ENT: no sore throat or hearing loss Resp: no cough, wheezing, or hemoptysis CV: no palpitations, otherwise see HPI GI: no abdominal pain, nausea, vomiting, diarrhea, or constipation GU: no dysuria, frequency, or hematuria Skin: no rash Neuro: no headache, numbness, tingling, or weakness of extremities Musculoskeletal: no joint pain or swelling Heme: no bleeding, DVT, or easy bruising Endo: no polydipsia or polyuria    Physical Exam: Blood pressure 119/58, pulse 63, temperature 98.2 F (36.8 C), temperature source Oral,  resp. rate 18, height 5' 6" (1.676 m), weight 148.508 kg (327 lb 6.4 oz), SpO2 95.00%.  Pt is alert and oriented, very pleasant morbidly obese woman in no distress. HEENT: normal Neck: JVP normal. Carotid upstrokes normal without bruits. No thyromegaly. Lungs: equal expansion, clear bilaterally CV: Apex is discrete and nondisplaced, RRR without murmur or   gallop Abd: soft, NT, +BS, no bruit, no hepatosplenomegaly Back: no CVA tenderness Ext: no C/C/E        Femoral pulses 2+= without bruits        DP/PT pulses intact and = Skin: warm and dry without rash Neuro: CNII-XII intact             Strength intact = bilaterally  Labs:   Lab Results  Component Value Date   WBC 7.8 08/20/2011   HGB 11.7* 08/20/2011   HCT 36.5 08/20/2011   MCV 91.5 08/20/2011   PLT 235 08/20/2011    Lab 08/22/11 0510 08/19/11 2148  NA 137 --  K 3.5 --  CL 93* --  CO2 33* --  BUN 38* --  CREATININE 1.22* --  CALCIUM 9.7 --  PROT -- 7.0  BILITOT -- 0.4  ALKPHOS -- 74  ALT -- 15  AST -- 17  GLUCOSE 100* --   Lab Results  Component Value Date   CKTOTAL 40 08/22/2011   CKMB 1.2 08/22/2011   TROPONINI <0.30 08/22/2011    Lab Results  Component Value Date   CHOL  Value: 166        ATP III CLASSIFICATION:  <200     mg/dL   Desirable  200-239  mg/dL   Borderline High  >=240    mg/dL   High        10/09/2009   CHOL 148 07/15/2009   CHOL 157 04/06/2009   Lab Results  Component Value Date   HDL 60 08/17/2010   HDL 51 10/09/2009   HDL 63.70 07/15/2009   Lab Results  Component Value Date   LDLCALC 77 08/17/2010   LDLCALC  Value: 83        Total Cholesterol/HDL:CHD Risk Coronary Heart Disease Risk Table                     Men   Women  1/2 Average Risk   3.4   3.3  Average Risk       5.0   4.4  2 X Average Risk   9.6   7.1  3 X Average Risk  23.4   11.0        Use the calculated Patient Ratio above and the CHD Risk Table to determine the patient's CHD Risk.        ATP III CLASSIFICATION (LDL):  <100     mg/dL   Optimal  100-129   mg/dL   Near or Above                    Optimal  130-159  mg/dL   Borderline  160-189  mg/dL   High  >190     mg/dL   Very High 10/09/2009   LDLCALC 59 07/15/2009   Lab Results  Component Value Date   TRIG 108 08/17/2010   TRIG 160* 10/09/2009   TRIG 125.0 07/15/2009   Lab Results  Component Value Date   CHOLHDL 3.3 10/09/2009   CHOLHDL 2 07/15/2009   CHOLHDL 3 04/06/2009   No results found for this basename: LDLDIRECT      Radiology: No results found.  EKG: NSR 70 bpm, WNL  ASSESSMENT AND PLAN:  1. Chest pain concerning for unstable angina 2. Acute diastolic heart failure 3. Type 2 DM 4. Morbid obesity  The patient has concerning symptoms now with 2 days of chest pressure radiating to jaw with minimal activity such as taking   a shower. Even though her objective markers are negative, I would favor proceeding with cardiac cath. She has a very strong risk profile with obesity, diabetes, and strong family history of premature CAD. Her body habitus makes noninvasive testing of limited utility. I have reviewed risks, indication, and alternatives to cath and possible PCI with the patient - she understands and agrees to proceed. Specific risks include but not limited to stroke, MI, bleeding, and death. I am going to start her on IV heparin and will plan right radial approach for cath.  Marthe Dant 08/22/2011, 2:42 PM    

## 2011-08-24 DIAGNOSIS — I2510 Atherosclerotic heart disease of native coronary artery without angina pectoris: Secondary | ICD-10-CM | POA: Diagnosis present

## 2011-08-24 LAB — CBC
MCH: 28.9 pg (ref 26.0–34.0)
MCV: 91.1 fL (ref 78.0–100.0)
Platelets: 231 10*3/uL (ref 150–400)
RBC: 4.15 MIL/uL (ref 3.87–5.11)

## 2011-08-24 LAB — GLUCOSE, CAPILLARY
Glucose-Capillary: 94 mg/dL (ref 70–99)
Glucose-Capillary: 99 mg/dL (ref 70–99)

## 2011-08-24 LAB — BASIC METABOLIC PANEL
CO2: 28 mEq/L (ref 19–32)
Calcium: 8.9 mg/dL (ref 8.4–10.5)
Creatinine, Ser: 0.96 mg/dL (ref 0.50–1.10)
Glucose, Bld: 97 mg/dL (ref 70–99)

## 2011-08-24 MED ORDER — ASPIRIN 81 MG PO TBEC: 81.0000 mg | DELAYED_RELEASE_TABLET | Freq: Every day | ORAL | Status: DC

## 2011-08-24 MED ORDER — POTASSIUM CHLORIDE ER 10 MEQ PO TBCR: 20.0000 meq | EXTENDED_RELEASE_TABLET | Freq: Every day | ORAL | Status: DC

## 2011-08-24 MED ORDER — POTASSIUM CHLORIDE CRYS ER 20 MEQ PO TBCR
40.0000 meq | EXTENDED_RELEASE_TABLET | Freq: Once | ORAL | Status: AC
  Administered 2011-08-24: 40 meq via ORAL

## 2011-08-24 MED ORDER — FUROSEMIDE 20 MG PO TABS: 20.0000 mg | ORAL_TABLET | Freq: Two times a day (BID) | ORAL | Status: DC

## 2011-08-24 MED FILL — Nicardipine HCl IV Soln 2.5 MG/ML: INTRAVENOUS | Qty: 1 | Status: AC

## 2011-08-24 NOTE — Progress Notes (Signed)
CSW contacted CM to make CM aware that patient is d/cing today and may need home health services. Patient does not qualify for HF medications. However, patient may be eligible for a scale.   [name], MSW, [employer] [phone]

## 2011-08-24 NOTE — Progress Notes (Signed)
   CARE MANAGEMENT NOTE 08/24/2011  Patient:  [name], [name]   Account Number:  [id number]  Date Initiated:  08/21/2011  Documentation initiated by:  Onnie Boer  Subjective/Objective Assessment:   PT WAS ADMITTED WITH DYSPNEA AND EXTREMITY SWELLING     Action/Plan:   PROGRESSION OF CARE AND DISCHARGE PLANNING   Anticipated DC Date:     Anticipated DC Plan:  HOME/SELF CARE      DC Planning Services  CM consult      Choice offered to / List presented to:             Status of service:  Completed, signed off Medicare Important Message given?   (If response is "NO", the following Medicare IM given date fields will be blank) Date Medicare IM given:   Date Additional Medicare IM given:    Discharge Disposition:  HOME/SELF CARE  Per UR Regulation:  Reviewed for med. necessity/level of care/duration of stay  If discussed at Long Length of Stay Meetings, dates discussed:    Comments:  08/23/11 Onnie Boer, RN, BSN 1604 SPOKE WITH THE PT AND SHE STATED THAT AT THIS TIME SHE HAS NO NEEDS.  ADP IS FOR DC TO HOME TOMORROW  08/21/2011 1200 Darlyne Russian RN, [place] [phone] Met with patient and daughter regarding discharge planning. The patient reports no problem with transportation to outpatient appointments. She would like to speak with a nutritionist regarding her diet for diabetes and CHF.  CM to continue to follow for discharge planning needs.  08/21/11 Onnie Boer, RN, BSN [phone] UR COMPLETED

## 2011-08-24 NOTE — Discharge Summary (Signed)
Patient ID: MYYA MEENACH MRN: [id number] DOB/AGE: [birth year] 60 y.o.  Admit date: 08/19/2011 Discharge date: 08/24/2011  Primary Care Physician:  Eustaquio Boyden, MD, MD  Discharge Diagnoses:     Principal Problem:  Acute Diastolic heart failure  Active Problems:  Coronary artery disease ( non obstrcutive)  Hypokalemia  Hypothyroidism  Diabetes mellitus type  II ( well controlled) Mixed hyperlipidemia  OBESITY, MORBID  DEPRESSION  Obstructive sleep apnea   Medication List  As of 08/24/2011 10:26 AM   STOP taking these medications         Diethylpropion HCl 75 MG Tb24      naproxen 500 MG tablet      pioglitazone 30 MG tablet         TAKE these medications         ALPRAZolam 0.25 MG tablet   Commonly known as: XANAX   Take 0.25 mg by mouth 2 (two) times daily.      aspirin 81 MG EC tablet   Take 1 tablet (81 mg total) by mouth daily.      atorvastatin 40 MG tablet   Commonly known as: LIPITOR   Take 40 mg by mouth daily.      cetirizine 10 MG tablet   Commonly known as: ZYRTEC   Take 10 mg by mouth daily.      cholecalciferol 1000 UNITS tablet   Commonly known as: VITAMIN D   Take 1,000 Units by mouth daily.      escitalopram 20 MG tablet   Commonly known as: LEXAPRO   Take 30 mg by mouth daily.      furosemide 20 MG tablet   Commonly known as: LASIX   Take 20 mg by mouth 2 (two) times daily.      glyBURIDE-metformin 1.25-250 MG per tablet   Commonly known as: GLUCOVANCE   Take 1 tablet by mouth 2 (two) times daily with a meal.      lamoTRIgine 100 MG tablet   Commonly known as: LAMICTAL   Take 150 mg by mouth daily.      levothyroxine 200 MCG tablet   Commonly known as: SYNTHROID, LEVOTHROID   Take 200 mcg by mouth daily.      MONOJECT MAGELLAN SAFETY NDL 25G X 1" Misc   Generic drug: NEEDLE (DISP) 25 G   by Does not apply route as directed.      naproxen 500 MG EC tablet   Commonly known as: EC NAPROSYN   Take 500 mg by mouth 2  (two) times daily with a meal.      omeprazole 40 MG capsule   Commonly known as: PRILOSEC   Take 40 mg by mouth daily.      potassium chloride 10 MEQ tablet   Commonly known as: K-DUR   Take 2 tablets (20 mEq total) by mouth daily.      ramipril 10 MG tablet   Commonly known as: ALTACE   Take 10 mg by mouth daily.      rOPINIRole 0.25 MG tablet   Commonly known as: REQUIP   Take 0.25 mg by mouth 3 (three) times daily.      rOPINIRole 0.25 MG tablet   Commonly known as: REQUIP   Take 0.25 mg by mouth at bedtime.      traZODone 50 MG tablet   Commonly known as: DESYREL   Take 150 mg by mouth at bedtime.      triamterene-hydrochlorothiazide 75-50 MG per tablet  Commonly known as: MAXZIDE   Take 1 tablet by mouth daily.            Disposition and Follow-up: Home with PCP follow up  Consults:   Excell Seltzer ( cardiology)  Significant Diagnostic Studies:  Dg Chest 2 View  08/20/2011  *RADIOLOGY REPORT*  Clinical Data: Shortness of breath.  Chest pressure.  Coronary artery disease.  CHEST - 2 VIEW  Comparison: 12/09/2010  Findings: Thoracic spondylosis noted. Cardiac and mediastinal contours appear unremarkable.  Mild prominence of upper zone pulmonary vasculature may reflect pulmonary venous hypertension.  No overt edema.  Mild thoracic spondylosis is present.  No pleural effusion noted.  IMPRESSION:  1.  Thoracic spondylosis. 2.  Equivocal cephalization of blood flow, query mild pulmonary venous hypertension.  No overt edema.  Original Report Authenticated By: Dellia Cloud, M.D.    Brief H and P: For complete details please refer to admission H and P, but in brief 61 y.o. female presents with concerns of dyspnea and chest pain. The patient reports that for the last several days she's been having progressive shortness of breath. She reports that when she lies back in a recumbent position she feels smothered. The patient reports that she has never experienced this before.  She reports that she had an echocardiogram several years ago but it was reportedly normal. She does have lower extremity venous stasis edema and takes Lasix intermittently as needed for excess fluid. Reports of tightness in his chest that she's been experiencing has gotten worse over the past 24 hours. She notes that she has been in her usual state of health. Approximately one week ago she developed worsening lower extremity edema. She subsequently developed exertional dyspnea and orthopnea. The patient took several doses of Lasix and noted a decrease in her symptoms, however over the past 2 days she now has worsening complaints. Chest of the pain is described as a tightness across the anterior chest that is present with motion, negligible at rest. No tabs at pharmacologic relief when symptomatic. The patient's dyspnea is most pronounced, similarly, with exertion. She also notes that this is present when supine. The patient had an elevated BNP of over 1600 when evaluated in the emergency department. With rest there is minimal dyspnea. The patient is significant cough, fever, chills, nausea, vomiting, diarrhea. Hospital admission was requested for further evaluation and management.   Physical Exam on Discharge:  Filed Vitals:   08/23/11 1630 08/23/11 2132 08/24/11 0247 08/24/11 0615  BP: 127/73 101/42 123/58 129/83  Pulse: 73 66 68 77  Temp:  97.8 F (36.6 C) 97.2 F (36.2 C) 96.8 F (36 C)  TempSrc:   Oral Oral  Resp: 18 18 18 18   Height:      Weight:    147.646 kg (325 lb 8 oz)  SpO2:  95% 94% 93%     Intake/Output Summary (Last 24 hours) at 08/24/11 1026 Last data filed at 08/24/11 1610  Gross per 24 hour  Intake    600 ml  Output   3075 ml  Net  -2475 ml    General: middle aged obese female in no acute distress.  HEENT: no pallor, no icterus, moist oral mucosa, no JVD, no lymphadenopathy  Heart: Normal s1 &s2 Regular rate and rhythm, without murmurs, rubs, gallops.  Lungs: Clear to  auscultation bilaterally.  Abdomen: Soft, nontender, nondistended, positive bowel sounds.  Extremities: No clubbing cyanosis or edema with positive pedal pulses. Cath over rt radial, no bleeding or hematoma  Neuro: Alert, awake, oriented x3, nonfocal.  CBC:    Component Value Date/Time   WBC 5.5 08/24/2011 0542   HGB 12.0 08/24/2011 0542   HCT 37.8 08/24/2011 0542   PLT 231 08/24/2011 0542   MCV 91.1 08/24/2011 0542   NEUTROABS 4.5 08/19/2011 2148   LYMPHSABS 1.4 08/19/2011 2148   MONOABS 0.3 08/19/2011 2148   EOSABS 0.3 08/19/2011 2148   BASOSABS 0.0 08/19/2011 2148    Basic Metabolic Panel:    Component Value Date/Time   NA 139 08/24/2011 0542   K 3.3* 08/24/2011 0542   K 3.5 08/17/2010   CL 97 08/24/2011 0542   CO2 28 08/24/2011 0542   BUN 28* 08/24/2011 0542   CREATININE 0.96 08/24/2011 0542   GLUCOSE 97 08/24/2011 0542   CALCIUM 8.9 08/24/2011 0542    Hospital Course:  Chest pain  possibly atypical vs related to underlying CHF symptoms  Serial CE negative  2 d echo with EF of 55-60% and indeterminate diastolic function Cardiac cath done on 4/10 ( Dr cooper) shows mild nonobstructive CAD ( 30-50% ostial stenosis).  Normal LVEF per cath with normal LV filling prsssure Cont statin, ACEI and  Will d/c on baby ASA D dimer negative  Recommend secondary prevention and lifestyle modification.  Acute diastolic CHF  patient presented with acute CHF symptoms related to diastolic dysfunction with elevated pro BNP  Diuresed with IV lasix, now switched to po and euvolemic. Can be discharged on home dose normal LV filling pressure on cath. Echo showing  DM  well controlled with A1C of 5.7  Will resume gyburide and metformin she's on aty home. Will d//c actos  hypokalemia  replenished with kcl . Will add kcl supplements   hypothyroidism  cont synthroid   Remaining medical issues are stable. Can be discharged home with outpt follow up. Have counseled on dietary restrictions, lifestyle  modifications including regular exercise , medication adherence and regular outpt PCP follow up    Time spent on Discharge: 45 minutes  Signed: Cashlyn Huguley 08/24/2011, 10:26 AM

## 2011-08-24 NOTE — Discharge Instructions (Signed)
Heart Failure Heart failure (HF) is a condition in which the heart has trouble pumping blood. This means your heart does not pump blood efficiently for your body to work well. In some cases of HF, fluid may back up into your lungs or you may have swelling (edema) in your lower legs. HF is a long-term (chronic) condition. It is important for you to take good care of yourself and follow your caregiver's treatment plan. CAUSES   Health conditions:   High blood pressure (hypertension) causes the heart muscle to work harder than normal. When pressure in the blood vessels is high, the heart needs to pump (contract) with more force in order to circulate blood throughout the body. High blood pressure eventually causes the heart to become stiff and weak.   Coronary artery disease (CAD) is the buildup of cholesterol and fat (plaques) in the arteries of the heart. The blockage in the arteries deprives the heart muscle of oxygen and blood. This can cause chest pain and may lead to a heart attack. High blood pressure can also contribute to CAD.   Heart attack (myocardial infarction) occurs when 1 or more arteries in the heart become blocked. The loss of oxygen damages the muscle tissue of the heart. When this happens, part of the heart muscle dies. The injured tissue does not contract as well and weakens the heart's ability to pump blood.   Abnormal heart valves can cause HF when the heart valves do not open and close properly. This makes the heart muscle pump harder to keep the blood flowing.   Heart muscle disease (cardiomyopathy or myocarditis) is damage to the heart muscle from a variety of causes. These can include drug or alcohol abuse, infections, or unknown reasons. These can increase the risk of HF.   Lung disease makes the heart work harder because the lungs do not work properly. This can cause a strain on the heart leading it to fail.   Diabetes increases the risk of HF. High blood sugar contributes  to high fat (lipid) levels in the blood. Diabetes can also cause slow damage to tiny blood vessels that carry important nutrients to the heart muscle. When the heart does not get enough oxygen and food, it can cause the heart to become weak and stiff. This leads to a heart that does not contract efficiently.   Other diseases can contribute to HF. These include abnormal heart rhythms, thyroid problems, and low blood counts (anemia).   Unhealthy lifestyle habits:   Obesity.   Smoking.   Eating foods high in fat and cholesterol.   Eating or drinking beverages high in salt.   Drug or alcohol abuse.   Lack of exercise.  SYMPTOMS  HF symptoms may vary and can be hard to detect. Symptoms may include:  Shortness of breath with activity, such as climbing stairs.   Persistent cough.   Swelling of the feet, ankles, legs, or abdomen.   Unexplained weight gain.   Difficulty breathing when lying flat.   Waking from sleep because of the need to sit up and get more air.   Rapid heartbeat.   Fatigue and loss of energy.   Feeling lightheaded or close to fainting.  DIAGNOSIS  A diagnosis of HF is based on your history, symptoms, physical examination, and diagnostic tests. Diagnostic tests for HF may include:  EKG.   Chest X-ray.   Blood tests.   Exercise stress test.   Blood oxygen test (arterial blood gas).   Evaluation   by a heart doctor (cardiologist).   Ultrasound evaluation of the heart (echocardiogram).   Heart artery test to look for blockages (angiogram).   Radioactive imaging to look at the heart (radionuclide test).  TREATMENT  Treatment is aimed at managing the symptoms of HF. Medicines, lifestyle changes, or surgical intervention may be necessary to treat HF.  Medicines to help treat HF may include:   Angiotensin-converting enzyme (ACE) inhibitors. These block the effects of a blood protein called angiotensin-converting enzyme. ACE inhibitors relax (dilate) the  blood vessels and help lower blood pressure. This decreases the workload of the heart, slows the progression of HF, and improves symptoms.   Angiotensin receptor blockers (ARBs). These medications work similar to ACE inhibitors. ARBs may be an alternative for people who cannot tolerate an ACE inhibitor.   Aldosterone antagonists. This medication helps get rid of extra fluid from your body. This lowers the volume of blood the heart has to pump.   Water pills (diuretics). Diuretics cause the kidneys to remove salt and water from the blood. The extra fluid is removed by urination. By removing extra fluid from the body, diuretics help lower the workload of the heart and help prevent fluid buildup in the lungs so breathing is easier.   Beta blockers. These prevent the heart from beating too fast and improve heart muscle strength. Beta blockers help maintain a normal heart rate, control blood pressure, and improve HF symptoms.   Digitalis. This increases the force of the heartbeat and may be helpful to people with HF or heart rhythm problems.   Healthy lifestyle changes include:   Stopping smoking.   Eating a healthy diet. Avoid foods high in fat. Avoid foods fried in oil or made with fat. A dietician can help with healthy food choices.   Limiting how much salt you eat.   Limiting alcohol intake to no more than 1 drink per day for women and 2 drinks per day for men. Drinking more than that is harmful to your heart. If your heart has already been damaged by alcohol or you have severe HF, drinking alcohol should be stopped completely.   Exercising as directed by your caregiver.   Surgical treatment for HF may include:   Procedures to open blocked arteries, repair damaged heart valves, or remove damaged heart muscle tissue.   A pacemaker to help heart muscle function and to control certain abnormal heart rhythms.   A defibrillator to possibly prevent sudden cardiac death.  HOME CARE  INSTRUCTIONS   Activity level. Your caregiver can help you determine what type of exercise program may be helpful. It is important to maintain your strength. Pace your physical activity to avoid shortness of breath or chest pain. Rest for 1 hour before and after meals. A cardiac rehabilitation program may be helpful to some people with HF.   Diet. Eat a heart healthy diet. Food choices should be low in saturated fat and cholesterol. Talk to a dietician to learn about heart healthy foods.   Salt intake. When you have HF, you need to limit the amount of salt you eat. Eat less than 1500 milligrams (mg) of salt per day or as recommended by your caregiver.   Weight monitoring. Weigh yourself every day. You should weigh yourself in the morning after you urinate and before you eat breakfast. Wear the same amount of clothing each time you weigh yourself. Record your weight daily. Bring your recorded weights to your clinic visits. Tell your caregiver right away if   you have gained 3 lb/1.4 kg in 1 day, or 5 lb/2.3 kg in a week or whatever amount you were told to report.   Blood pressure monitoring. This should be done as directed by your caregiver. A home blood pressure cuff can be purchased at a drugstore. Record your blood pressure numbers and bring them to your clinic visits. Tell your caregiver if you become dizzy or lightheaded upon standing up.   Smoking. If you are currently a smoker, it is time to quit. Nicotine makes your heart work harder by causing your blood vessels to constrict. Do not use nicotine gum or patches before talking to your caregiver.   Follow up. Be sure to schedule a follow-up visit with your caregiver. Keep all your appointments.  SEEK MEDICAL CARE IF:   Your weight increases by 3 lb/1.4 kg in 1 day or 5 lb/2.3 kg in a week.   You notice increasing shortness of breath that is unusual for you. This may happen during rest, sleep, or with activity.   You cough more than normal,  especially with physical activity.   You notice more swelling in your hands, feet, ankles, or belly (abdomen).   You are unable to sleep because it is hard to breathe.   You cough up bloody mucus (sputum).   You begin to feel "jumping" or "fluttering" sensations (palpitations) in your chest.  SEEK IMMEDIATE MEDICAL CARE IF:   You have severe chest pain or pressure which may include symptoms such as:   Pain or pressure in the arms, neck, jaw, or back.   Feeling sweaty.   Feeling sick to your stomach (nauseous).   Feeling short of breath while at rest.   Having a fast or irregular heartbeat.   You experience stroke symptoms. These symptoms include:   Facial weakness or numbness.   Weakness or numbness in an arm, leg, or on one side of your body.   Blurred vision.   Difficulty talking or thinking.   Dizziness or fainting.   Severe headache.  THESE ARE MEDICAL EMERGENCIES. Do not wait to see if the symptoms go away. Call your local emergency services (911 in [place] DO NOT drive yourself to the hospital. IMPORTANT  Make a list of every medicine, vitamin, or herbal supplement you are taking. Keep the list with you at all times. Show it to your caregiver at every visit. Keep the list up-to-date.   Ask your caregiver or pharmacist to write an explanation of each medicine you are taking. This should include:   Why you are taking it.   The possible side effects.   The best time of day to take it.   Foods to take with it or what foods to avoid.   When to stop taking it.  MAKE SURE YOU:   Understand these instructions.   Will watch your condition.   Will get help right away if you are not doing well or get worse.  Document Released: 05/01/2005 Document Revised: 04/20/2011 Document Reviewed: 08/13/2009 [employer] Patient Information 2012 [employer]. 

## 2011-08-28 ENCOUNTER — Encounter: Payer: Self-pay | Admitting: Family Medicine

## 2011-08-28 ENCOUNTER — Ambulatory Visit (INDEPENDENT_AMBULATORY_CARE_PROVIDER_SITE_OTHER): Payer: BC Managed Care – PPO | Admitting: Family Medicine

## 2011-08-28 DIAGNOSIS — I2510 Atherosclerotic heart disease of native coronary artery without angina pectoris: Secondary | ICD-10-CM

## 2011-08-28 DIAGNOSIS — F329 Major depressive disorder, single episode, unspecified: Secondary | ICD-10-CM

## 2011-08-28 DIAGNOSIS — F3289 Other specified depressive episodes: Secondary | ICD-10-CM

## 2011-08-28 DIAGNOSIS — E119 Type 2 diabetes mellitus without complications: Secondary | ICD-10-CM

## 2011-08-28 DIAGNOSIS — I5030 Unspecified diastolic (congestive) heart failure: Secondary | ICD-10-CM

## 2011-08-28 MED ORDER — TRAZODONE HCL 100 MG PO TABS: 100.0000 mg | ORAL_TABLET | Freq: Every day | ORAL | Status: DC

## 2011-08-28 MED ORDER — ROPINIROLE HCL 0.25 MG PO TABS: 0.2500 mg | ORAL_TABLET | Freq: Three times a day (TID) | ORAL | Status: DC

## 2011-08-28 NOTE — Assessment & Plan Note (Signed)
Slowly back off trazodone per pt preference.

## 2011-08-28 NOTE — Progress Notes (Addendum)
  Subjective:    Patient ID: [name], female    DOB: [birth year], 61 y.o.   MRN: [id number]  HPI CC: hosp f/u  [name] is a pleasant 61 yo with h/o hypothyroidism, controlled DM, OSA, depression and obesity who was hospitalized 4/6-03/2012 at [facility] for acute diastolic CFH exacerbation, treated with IV lasix diuresis, and ultimately underwent catheterization 2/2 chest pressure with exhertion during hospitalization which ultimately revealed nonobstructive CAD and normal LV fxn.    Chest pain thought atypical and possibly due to underlying CHF, serial CE were negative and echo showed EF 55-60%.  D dimer was negative.  Started on baby aspirin as well as other home medications.  For DM, actos was discontinued.    Sent home 4/11.  Still short of breath on occasion, mainly when walking and increased activity.  To start doing aquatic exercise.  Notes gets very short of breath with walking.  Still deconditioned.  Wonders about handicap placard  Has stopped diet pepsi.  Had CHF education, keeps up with weight daily.  Knows green and yellow zone with weights.  Would like to come off trazodone - discussed. Would like to increase requip to 3 times daily a working better for her.   On high dose maxzide and lasix.  Wt Readings from Last 3 Encounters:  08/28/11 324 lb 12 oz (147.306 kg)  08/24/11 325 lb 8 oz (147.646 kg)  08/24/11 325 lb 8 oz (147.646 kg)     Past Medical History  Diagnosis Date  . Vitamin B 12 deficiency   . Anxiety   . Depression   . Hyperlipidemia   . Hypothyroid   . CAD (coronary artery disease)     nonobstructive plaque  . Obesity     morbid obesity  . T2DM (type 2 diabetes mellitus)   . OSA (obstructive sleep apnea)   . Vitamin d deficiency     last check 47 (11/2010)  . Fibula fracture    Past Surgical History  Procedure Date  . Osteotomy and ulnar shortening   . Knee surgery 11-02-2008    Partial medical and Lat Menisectomies and Condroplasty (Dr.  Thurston Hole)  . Cardiac catheterization 10/12/09    EF 70%  . Mr abdomen 01/16/2011    Left 4cm adrenal myelolipoma  . Cardiac catheterization 08/23/2011    Mild nonobstructive CAD, normal LV fxn    Review of Systems Per HPI    Objective:   Physical Exam  Nursing note and vitals reviewed. Constitutional: She appears well-developed and well-nourished. No distress.  HENT:  Head: Normocephalic and atraumatic.  Mouth/Throat: Oropharynx is clear and moist. No oropharyngeal exudate.  Eyes: Conjunctivae and EOM are normal. Pupils are equal, round, and reactive to light. No scleral icterus.  Neck: Normal range of motion. Neck supple. Carotid bruit is not present.  Cardiovascular: Normal rate, regular rhythm, normal heart sounds and intact distal pulses.   No murmur heard. Pulmonary/Chest: Effort normal and breath sounds normal. No respiratory distress. She has no wheezes. She has no rales.  Musculoskeletal: She exhibits no edema.  Lymphadenopathy:    She has no cervical adenopathy.  Skin: Skin is warm and dry. No rash noted.  Psychiatric: She has a normal mood and affect.       Assessment & Plan:

## 2011-08-28 NOTE — Assessment & Plan Note (Addendum)
Reviewed all blood work, discussed importance of daily weights. Dyspnea continued, but slowly overall improving. Discussed handicap placard, filled out temporary but no more than 6 mo. Decrease maxzide to 1/2 pill daily as on lasix regularly now. Filled out temp handicap placard, indication: deconditioning from recent CHF exac and use of cane to walk 2/2 ortho condition (hip and knee).  Ortho is Dr. Kirtland Bouchard at [facility].

## 2011-08-28 NOTE — Patient Instructions (Signed)
Keep eye on weights, if increasing 2-3 pounds per day let me know (concern for retention of fluid) Decrease maxzide to 1/2 pill daily. Decrease trazodone to 100mg  nightly. Continue requip three times a day for now. Good to see you today, call us with questions.

## 2011-08-28 NOTE — Assessment & Plan Note (Signed)
actos stopped during hospitalization Advised to keep eye on sugars as likely will start trending up. If not controlled, increase glucovance.

## 2011-08-28 NOTE — Assessment & Plan Note (Signed)
Continue meds.  Started on baby ASA in hospital

## 2011-08-30 ENCOUNTER — Telehealth: Payer: Self-pay | Admitting: Family Medicine

## 2011-08-30 ENCOUNTER — Telehealth: Payer: Self-pay | Admitting: Cardiology

## 2011-08-30 NOTE — Telephone Encounter (Signed)
Dr. Johney Frame reviewed notes & pt recent cath. Pt weight is decreasing 1 pound/day.  Weight now 316 today. Pt should keep 09/04/11 appt with Dr. Daleen Squibb.  Follow-up with call to pcp. Mylo Red RN

## 2011-08-30 NOTE — Telephone Encounter (Signed)
New msg Pt called and said she had got discharged from hospital on [id number] and this week has been having some chest discomfort and has been fatigued. Please call

## 2011-08-30 NOTE — Telephone Encounter (Signed)
Caller: Alonzo/Patient; PCP: Eustaquio Boyden; CB#: [phone]; ; ; Call regarding Follow Up Regarding CHF.  States has been feeling more tightness in her chest since being discharged from [facility] 04/11/13l; states has called cardiologist office as well and is awaiting instructions from them.  Declines triage; denies shortness of breath or difficulty breathing or cough.  Seen in office 08/28/11 and feels new tightness since 08/29/11.  Ankles not puffy, losing weight daily.  States just a follow up call to PCP and is awaiting further instructions from cardiologist.  Per patient request, info to office for provider review; patient declines further triage or intervention at this time.  RN callback scheduled for 1500 to check on status.  1510:  On callback, states cardiologist was not concerned, and was told to follow up with PCP.  New triage done for respiratory distress; states her tightness is no worse than when seen, but no better, either.  States continues to lose weight.  States she is more fatigued than usual, but can perform her usual activities.  States her post hopsital information said to call if feeling these things, so she called.  Per protocol, emergent symptoms denied; advised appt within 24 hours.   Appt sched 08/31/11 1015 with Dr. Sharen Hones.

## 2011-08-30 NOTE — Telephone Encounter (Signed)
Patient went to the ER with C/O of fatigue and tightness of chest and tingling of neck she had  a cardiac cath on 08/23/11 and d/C home 08/24/11 She was  diagnosed with CHF. The same Symptoms started yesterday, they are  a litter better today. She does not want to go back to the ER. Pt has an appointment to see Dr. Daleen Squibb on 09/04/11. She would like to know if she needs to see MD sooner.

## 2011-08-30 NOTE — Telephone Encounter (Signed)
Noted. Thanks. Will see tomorrow.  

## 2011-08-31 ENCOUNTER — Encounter: Payer: Self-pay | Admitting: Family Medicine

## 2011-08-31 ENCOUNTER — Telehealth: Payer: Self-pay | Admitting: Family Medicine

## 2011-08-31 ENCOUNTER — Other Ambulatory Visit: Payer: Self-pay | Admitting: *Deleted

## 2011-08-31 ENCOUNTER — Ambulatory Visit (INDEPENDENT_AMBULATORY_CARE_PROVIDER_SITE_OTHER): Payer: BC Managed Care – PPO | Admitting: Family Medicine

## 2011-08-31 ENCOUNTER — Ambulatory Visit: Payer: BC Managed Care – PPO | Admitting: Family Medicine

## 2011-08-31 VITALS — BP 132/78 | HR 76 | Temp 97.5°F | Wt 320.0 lb

## 2011-08-31 DIAGNOSIS — R0600 Dyspnea, unspecified: Secondary | ICD-10-CM | POA: Insufficient documentation

## 2011-08-31 DIAGNOSIS — R0789 Other chest pain: Secondary | ICD-10-CM

## 2011-08-31 DIAGNOSIS — G4733 Obstructive sleep apnea (adult) (pediatric): Secondary | ICD-10-CM

## 2011-08-31 DIAGNOSIS — R0609 Other forms of dyspnea: Secondary | ICD-10-CM

## 2011-08-31 DIAGNOSIS — I2510 Atherosclerotic heart disease of native coronary artery without angina pectoris: Secondary | ICD-10-CM

## 2011-08-31 DIAGNOSIS — R0989 Other specified symptoms and signs involving the circulatory and respiratory systems: Secondary | ICD-10-CM

## 2011-08-31 DIAGNOSIS — I5030 Unspecified diastolic (congestive) heart failure: Secondary | ICD-10-CM

## 2011-08-31 DIAGNOSIS — G4700 Insomnia, unspecified: Secondary | ICD-10-CM

## 2011-08-31 MED ORDER — ALBUTEROL SULFATE HFA 108 (90 BASE) MCG/ACT IN AERS: 2.0000 | INHALATION_SPRAY | Freq: Four times a day (QID) | RESPIRATORY_TRACT | Status: DC | PRN

## 2011-08-31 MED ORDER — POTASSIUM CHLORIDE ER 10 MEQ PO TBCR: 20.0000 meq | EXTENDED_RELEASE_TABLET | Freq: Every day | ORAL | Status: DC

## 2011-08-31 NOTE — Telephone Encounter (Signed)
plz notify I reviewed sleep study and CPAP download. I do recommend regular CPAP use at night to see if improves sxs.  If not better, recommend sooner appt with pulm at [facility] for further eval.  periodic limb movement index of 37.9/hr OSA/hypopnea syndrome respondedto CPAP at 10cm  AHI 2.7 Average daily use = 6 hours

## 2011-08-31 NOTE — Assessment & Plan Note (Addendum)
Seems stable, well controlled with continued weight loss. May consider decreasing lasix to 20mg  daily.

## 2011-08-31 NOTE — Assessment & Plan Note (Addendum)
Followed by [facility] I wonder how much possible pulm HTN contributing to dyspnea, encouraged regular CPAP use (not fully compliant currently). Has f/u appt with pulm 10/2011. If no improvement over next few days, will recommend schedule appt sooner.

## 2011-08-31 NOTE — Assessment & Plan Note (Addendum)
With continued dyspnea on exhertion.  I don't have good explanation for atypical chest pain but recent cath reassuring. D dimer in hospital negative.  CXR in hospital ?mild pulm venous HTN H/o OSA - anticipate pulm HTN contributing to DOE as well as deconditioning/restriction from obesity.  Cconsider PDE5, re eval with pulm if continued SOB. Recommended regular CPAP use nightly. Ambulatory pulse ox today - maintains 95%.

## 2011-08-31 NOTE — Patient Instructions (Addendum)
Spirometry looking normal today, points against any COPD. I would like [place] to try albuterol inhaler though for when you feel short winded. No other changes today. I think you're doing well.

## 2011-08-31 NOTE — Telephone Encounter (Signed)
Patient needed refill due to taking 2 pills QD. Sent in # 60 instead of # 30

## 2011-08-31 NOTE — Progress Notes (Signed)
Subjective:    Patient ID: [name], female    DOB: 11-19-1950, 61 y.o.   MRN: [id number]  HPI CC: chest pain and fatigue.  [name] is a pleasant 61 yo with h/o hypothyroidism, controlled DM, OSA, depression and obesity who was hospitalized 4/6-03/2012 at [facility] for acute diastolic CFH exacerbation, treated with IV lasix diuresis, and ultimately underwent catheterization 2/2 chest pressure with exhertion during hospitalization which revealed nonobstructive CAD and normal LV fxn.   Chest pain thought atypical and possibly due to underlying CHF, serial CE were negative and echo showed EF 55-60%. D dimer was negative. Started on baby aspirin as well as other home medications. For DM, actos was discontinued.   Presents today with continued right sided chest pain described as dull constant pain and mid chest tightness and continued fatigue. Weight continues to drop.  Brings log showing daily weights which have continued to trend down. 4/14 - very active 4/15 - started getting worsening chest pressure and short of breath with activity.  Very tired as well.   4/16 - feeling intermittent tightness in left jaw and in chest, worse with deep breath. 4/17 - headache 4/18 - this morning felt better, then got short of breath with walking up stairs.  Also when came into office noted DOE with walk from parking lot to waiting room.  H/o OSA - on CPAP nightly.  Irregular use.  Followed by pulm at [facility].  No nausea, diaphoresis.  No left sided chest pain.  Denies wheezing or coughing.  No known h/o asthma or COPD.  Quit smoking 14 yrs ago, prior 2 ppd x 49yrs.  Has used inhaler for bronchitis in past but not recently.  States doesn't tend to do well with inhalers 2/2 gag reflex.  Wt Readings from Last 3 Encounters:  08/31/11 320 lb (145.151 kg)  08/28/11 324 lb 12 oz (147.306 kg)  08/24/11 325 lb 8 oz (147.646 kg)   Past Medical History  Diagnosis Date  . Vitamin B 12 deficiency   . Anxiety   .  Depression   . Hyperlipidemia   . Hypothyroid   . CAD (coronary artery disease)     nonobstructive plaque  . Obesity     morbid obesity  . T2DM (type 2 diabetes mellitus)   . OSA (obstructive sleep apnea)   . Vitamin d deficiency     last check 47 (11/2010)  . Fibula fracture      Review of Systems Per HPI    Objective:   Physical Exam  Nursing note and vitals reviewed. Constitutional: She appears well-developed and well-nourished. No distress.       obese  HENT:  Head: Normocephalic and atraumatic.  Mouth/Throat: Oropharynx is clear and moist. No oropharyngeal exudate.  Eyes: Conjunctivae and EOM are normal. Pupils are equal, round, and reactive to light. No scleral icterus.  Neck: Normal range of motion. Neck supple.  Cardiovascular: Normal rate, regular rhythm, normal heart sounds and intact distal pulses.   No murmur heard.      No pain with palpation at sternum/chest wall  Pulmonary/Chest: Effort normal and breath sounds normal. No respiratory distress. She has no wheezes. She has no rales.       Somewhat distant but no wheezing  Musculoskeletal: She exhibits no edema.  Lymphadenopathy:    She has no cervical adenopathy.  Skin: Skin is warm and dry. No rash noted.  Psychiatric: She has a normal mood and affect.  Assessment & Plan:

## 2011-08-31 NOTE — Assessment & Plan Note (Addendum)
Ambulatory pulse ox maintains O2 sat at 95%.  No significant dyspnea noted after ambulation. 54 PY smoker hx but quit 2000.  Checked spirometry today - normal; FVC 89%, FEV1 96%, ratio 1.1.  Trial of albuterol for SOB.

## 2011-08-31 NOTE — Progress Notes (Signed)
Addended by: Alvina Chou on: 08/31/2011 04:17 PM   Modules accepted: Orders

## 2011-09-01 LAB — BASIC METABOLIC PANEL
GFR: 37.16 mL/min — ABNORMAL LOW (ref >60.00)
Potassium: 4.6 mEq/L (ref 3.5–5.1)
Sodium: 133 mEq/L — ABNORMAL LOW (ref 135–145)

## 2011-09-01 LAB — BRAIN NATRIURETIC PEPTIDE: Pro B Natriuretic peptide (BNP): 19 pg/mL (ref 0.0–100.0)

## 2011-09-01 NOTE — Telephone Encounter (Signed)
Discussed below.  Also discussed recent blood work results - BNP normal.  Cr elevated to 1.5 anticipate prerenal from overdiuresis. rec stop lasix over weekend, increase water intake to 64 oz per day, recheck with Dr. Daleen Squibb.  See how SOB does with this change.  ?if from dehydration.

## 2011-09-04 ENCOUNTER — Encounter: Payer: Self-pay | Admitting: Cardiology

## 2011-09-04 ENCOUNTER — Ambulatory Visit (INDEPENDENT_AMBULATORY_CARE_PROVIDER_SITE_OTHER): Payer: BC Managed Care – PPO | Admitting: Cardiology

## 2011-09-04 VITALS — BP 122/66 | HR 76 | Ht 66.0 in | Wt 316.0 lb

## 2011-09-04 DIAGNOSIS — R0789 Other chest pain: Secondary | ICD-10-CM

## 2011-09-04 DIAGNOSIS — R0609 Other forms of dyspnea: Secondary | ICD-10-CM

## 2011-09-04 DIAGNOSIS — I5032 Chronic diastolic (congestive) heart failure: Secondary | ICD-10-CM

## 2011-09-04 DIAGNOSIS — E119 Type 2 diabetes mellitus without complications: Secondary | ICD-10-CM

## 2011-09-04 DIAGNOSIS — G4733 Obstructive sleep apnea (adult) (pediatric): Secondary | ICD-10-CM

## 2011-09-04 DIAGNOSIS — R0600 Dyspnea, unspecified: Secondary | ICD-10-CM

## 2011-09-04 DIAGNOSIS — E785 Hyperlipidemia, unspecified: Secondary | ICD-10-CM

## 2011-09-04 DIAGNOSIS — R0989 Other specified symptoms and signs involving the circulatory and respiratory systems: Secondary | ICD-10-CM

## 2011-09-04 DIAGNOSIS — I509 Heart failure, unspecified: Secondary | ICD-10-CM

## 2011-09-04 MED ORDER — FUROSEMIDE 40 MG PO TABS: 40.0000 mg | ORAL_TABLET | Freq: Every day | ORAL | Status: DC

## 2011-09-04 NOTE — Assessment & Plan Note (Signed)
A long talk with her today over 30 minutes describing the fact that secondary to her morbid obesity and diabetes and she has diastolic dysfunction. Despite the fact she had an increase in her BUN and creatinine on Lasix, her weight has gone back up since stopping of the weekend. We'll start back on Lasix 40 mg by mouth every morning and stop her Maxzide. Will arrange for a basic metabolic profile in a week 10 days. She understands that her BUN and creatinine we'll have to rule that time and keep her out of acute heart failure. I strongly advised her to lose as much weight as possible and to see a dietitian/nutritionist about weight reduction and salt restriction.

## 2011-09-04 NOTE — Progress Notes (Signed)
HPI [name] returns today for worsening weight gain off of Lasix. Her renal function had deteriorated and her primary care discontinued it last week. Since then she's gained 6 pounds. She is only taking Maxzide.  She denies orthopnea PND but is getting some edema. She's also had chest discomfort as well -- he had another catheterization which showed nonobstructive disease. Her ejection fraction is normal. She clearly has diastolic dysfunction. This is from her history of morbid obesity and diabetes. She has not lost any additional weight since last saw her.  Past Medical History  Diagnosis Date  . Vitamin B 12 deficiency   . Anxiety   . Depression   . Hyperlipidemia   . Hypothyroid   . CAD (coronary artery disease)     nonobstructive plaque  . Obesity     morbid obesity  . T2DM (type 2 diabetes mellitus)   . OSA (obstructive sleep apnea)     medium quattro full face mask CPAP 10cm  . Vitamin d deficiency     last check 47 (11/2010)  . Fibula fracture     Current Outpatient Prescriptions  Medication Sig Dispense Refill  . albuterol (PROVENTIL HFA;VENTOLIN HFA) 108 (90 BASE) MCG/ACT inhaler Inhale 2 puffs into the lungs every 6 (six) hours as needed for wheezing.  1 Inhaler  3  . ALPRAZolam (XANAX) 0.25 MG tablet Take 0.25 mg by mouth 2 (two) times daily.      Marland Kitchen aspirin EC 81 MG EC tablet Take 1 tablet (81 mg total) by mouth daily.  30 tablet  3  . atorvastatin (LIPITOR) 40 MG tablet Take 40 mg by mouth daily.      . cetirizine (ZYRTEC) 10 MG tablet Take 10 mg by mouth daily.      . cholecalciferol (VITAMIN D) 1000 UNITS tablet Take 1,000 Units by mouth daily.        . Cyanocobalamin (VITAMIN B-12 IJ) Inject as directed every 30 (thirty) days.      Marland Kitchen escitalopram (LEXAPRO) 20 MG tablet Take 30 mg by mouth daily.        Marland Kitchen glyBURIDE-metformin (GLUCOVANCE) 1.25-250 MG per tablet Take 1 tablet by mouth 2 (two) times daily with a meal.      . lamoTRIgine (LAMICTAL) 100 MG tablet Take  150 mg by mouth daily.       Marland Kitchen levothyroxine (SYNTHROID, LEVOTHROID) 200 MCG tablet Take 200 mcg by mouth daily.      . naproxen (EC NAPROSYN) 500 MG EC tablet Take 500 mg by mouth 2 (two) times daily with a meal.      . NEEDLE, DISP, 25 G (MONOJECT MAGELLAN SAFETY NDL) 25G X 1" MISC by Does not apply route as directed.        Marland Kitchen omeprazole (PRILOSEC) 40 MG capsule Take 40 mg by mouth daily.        . potassium chloride (K-DUR) 10 MEQ tablet Take 2 tablets (20 mEq total) by mouth daily.  60 tablet  3  . ramipril (ALTACE) 10 MG tablet Take 10 mg by mouth daily.      Marland Kitchen rOPINIRole (REQUIP) 0.25 MG tablet Take 1 tablet (0.25 mg total) by mouth 3 (three) times daily.  90 tablet  11  . tiZANidine (ZANAFLEX) 4 MG capsule as needed.      . traZODone (DESYREL) 100 MG tablet Take 1 tablet (100 mg total) by mouth at bedtime.  30 tablet  6  . furosemide (LASIX) 40 MG tablet Take 1 tablet (  40 mg total) by mouth daily.  30 tablet  11    Allergies  Allergen Reactions  . Erythromycin Base     REACTION: Vomitting  . Neomycin     vomiting     Family History  Problem Relation Age of Onset  . Coronary artery disease Mother   . Stroke Mother   . Diabetes Mother   . Heart attack Father   . Heart attack Brother     History   Social History  . Marital Status: Legally Separated    Spouse Name: N/A    Number of Children: 1  . Years of Education: N/A   Occupational History  . Nature conservation officer    Social History Main Topics  . Smoking status: Former Smoker    Quit date: 08/09/1998  . Smokeless tobacco: Not on file  . Alcohol Use: No  . Drug Use: No  . Sexually Active: No   Other Topics Concern  . Not on file   Social History Narrative   Past History: Past Medical History:Last updated: 07/13/2011B12 DeficiencyAnxiety DisorderCoronary Artery DiseaseDepressionDiabetesHyperlipidemiaHypothyroidismObesity Past Surgical History:Last updated: 06/05/2011NSVD x1Persantine Cardiolite- wnl 97- 02/00Ulnar  osteotomy  ([name]), negative wrist 10/01Colonoscopy 03/30/06Abd. U/S wnl except fatty liver 03/06Adenosine Myoview nml 4/7/09L Knee Partial medial and Lat Menisectomies and Condroplasty (Dr Thurston Hole) 6/21/10HOSP CP R/O'd Nonobstruct CAD by Cath  5/27-6/1/2011CATH and ECHO Nonobstr CAD  EF 60% 5/31/2011HOSP CP R/O'd Cath w/ CAD EF 60% 5/27-10/13/2009 Family History:Last updated: 2010/12/18Father: Died  at age 24 of a heart attackMother: Died at age 22 with coronary disease, S/P 14 angioplasties and 5-vessel              coronary artery bypass grafting,  she also had a stroke and diabetesBrother died at the age of 13 of a heart attack, he had his first heart              attack at 61 years of age.  Sister A 64    ROS ALL NEGATIVE EXCEPT THOSE NOTED IN HPI  PE  General Appearance: well developed, well nourished in no acute distress, morbidly obese HEENT: symmetrical face, PERRLA, good dentition  Neck: no JVD, thyromegaly, or adenopathy, trachea midline Chest: symmetric without deformity Cardiac: PMI non-displaced, RRR, normal S1, S2, no gallop or murmur Lung: clear to ausculation and percussion Vascular: all pulses full without bruits  Abdominal: nondistended, nontender, good bowel sounds, no HSM, no bruits Extremities: no cyanosis, clubbing 2+ pitting edema, no sign of DVT, no varicosities  Skin: normal color, no rashes Neuro: alert and oriented x 3, non-focal Pysch: normal affect  EKG  BMET    Component Value Date/Time   NA 133* 08/31/2011 1617   K 4.6 08/31/2011 1617   K 3.5 08/17/2010   CL 93* 08/31/2011 1617   CO2 27 08/31/2011 1617   GLUCOSE 108* 08/31/2011 1617   BUN 38* 08/31/2011 1617   CREATININE 1.5* 08/31/2011 1617   CALCIUM 9.3 08/31/2011 1617   GFRNONAA 63* 08/24/2011 0542   GFRAA 74* 08/24/2011 0542    Lipid Panel     Component Value Date/Time   CHOL  Value: 166        ATP III CLASSIFICATION:  <200     mg/dL   Desirable  161-096  mg/dL   Borderline High  >=045    mg/dL   High         08/21/8117 0340   TRIG 108 08/17/2010   TRIG 160* 10/09/2009 0340   HDL 60  08/17/2010   CHOLHDL 3.3 10/09/2009 0340   VLDL 32 10/09/2009 0340   LDLCALC 77 08/17/2010   LDLCALC  Value: 83        Total Cholesterol/HDL:CHD Risk Coronary Heart Disease Risk Table                     Men   Women  1/2 Average Risk   3.4   3.3  Average Risk       5.0   4.4  2 X Average Risk   9.6   7.1  3 X Average Risk  23.4   11.0        Use the calculated Patient Ratio above and the CHD Risk Table to determine the patient's CHD Risk.        ATP III CLASSIFICATION (LDL):  <100     mg/dL   Optimal  161-096  mg/dL   Near or Above                    Optimal  130-159  mg/dL   Borderline  045-409  mg/dL   High  >811     mg/dL   Very High 01/27/7828 5621    CBC    Component Value Date/Time   WBC 5.5 08/24/2011 0542   RBC 4.15 08/24/2011 0542   HGB 12.0 08/24/2011 0542   HCT 37.8 08/24/2011 0542   PLT 231 08/24/2011 0542   MCV 91.1 08/24/2011 0542   MCH 28.9 08/24/2011 0542   MCHC 31.7 08/24/2011 0542   RDW 14.6 08/24/2011 0542   LYMPHSABS 1.4 08/19/2011 2148   MONOABS 0.3 08/19/2011 2148   EOSABS 0.3 08/19/2011 2148   BASOSABS 0.0 08/19/2011 2148

## 2011-09-04 NOTE — Patient Instructions (Signed)
Your physician has recommended you make the following change in your medication:  Stop Maxzide   Restart Furosemide (lasix) 40mg  daily  Your physician recommends that you return for lab work in: 1 week for a bmp.  We will call you with your results.  Your physician wants you to follow-up in: 1 year with Dr. Daleen Squibb. You will receive a reminder letter in the mail two months in advance. If you don't receive a letter, please call our office to schedule the follow-up appointmen  Your doctor recommends you to follow a diet plan that is low in sodium, low in concentrated sweets & carbohydrates.  Your physician recommends that you weigh, daily, at the same time every day, and in the same amount of clothing. Please record your daily weights on the handout provided and bring it to your next appointment.  Your physician encouraged you to lose weight for better health.  Your physician discussed the importance of regular exercise and recommended that you start or continue a regular exercise program for good health.

## 2011-09-06 ENCOUNTER — Ambulatory Visit: Payer: BC Managed Care – PPO | Admitting: Family Medicine

## 2011-09-06 ENCOUNTER — Other Ambulatory Visit: Payer: Self-pay | Admitting: Family Medicine

## 2011-09-07 ENCOUNTER — Telehealth: Payer: Self-pay

## 2011-09-07 DIAGNOSIS — I509 Heart failure, unspecified: Secondary | ICD-10-CM

## 2011-09-07 DIAGNOSIS — I2510 Atherosclerotic heart disease of native coronary artery without angina pectoris: Secondary | ICD-10-CM

## 2011-09-07 DIAGNOSIS — G4733 Obstructive sleep apnea (adult) (pediatric): Secondary | ICD-10-CM

## 2011-09-07 DIAGNOSIS — E119 Type 2 diabetes mellitus without complications: Secondary | ICD-10-CM

## 2011-09-07 NOTE — Telephone Encounter (Signed)
Pt left v/m that Dr Daleen Squibb advised pt to contact PCP about referral for nutritionist. Pt also wanted Dr Reece Agar to know Dr Daleen Squibb scheduled kidney function test on 09/13/11. Pt can be reached at [phone].

## 2011-09-07 NOTE — Telephone Encounter (Signed)
Placed referral in chart. 

## 2011-09-11 ENCOUNTER — Encounter: Payer: Self-pay | Admitting: Family Medicine

## 2011-09-11 ENCOUNTER — Other Ambulatory Visit (INDEPENDENT_AMBULATORY_CARE_PROVIDER_SITE_OTHER): Payer: BC Managed Care – PPO

## 2011-09-11 ENCOUNTER — Other Ambulatory Visit: Payer: Self-pay | Admitting: Family Medicine

## 2011-09-11 ENCOUNTER — Ambulatory Visit (INDEPENDENT_AMBULATORY_CARE_PROVIDER_SITE_OTHER): Payer: BC Managed Care – PPO | Admitting: Family Medicine

## 2011-09-11 VITALS — BP 134/82 | HR 70 | Temp 97.2°F | Wt 322.0 lb

## 2011-09-11 DIAGNOSIS — I509 Heart failure, unspecified: Secondary | ICD-10-CM

## 2011-09-11 DIAGNOSIS — E119 Type 2 diabetes mellitus without complications: Secondary | ICD-10-CM

## 2011-09-11 DIAGNOSIS — N289 Disorder of kidney and ureter, unspecified: Secondary | ICD-10-CM

## 2011-09-11 DIAGNOSIS — I5032 Chronic diastolic (congestive) heart failure: Secondary | ICD-10-CM

## 2011-09-11 NOTE — Assessment & Plan Note (Signed)
questions answered. Discussed pt needs to watch weight but use this as gauge of overall status, not end all. Briefly discussed dietary changes, but recommended f/u with nutritionist for personalized diet plan. BMP today. Wt Readings from Last 3 Encounters:  09/11/11 322 lb (146.058 kg)  09/04/11 316 lb (143.337 kg)  08/31/11 320 lb (145.151 kg)

## 2011-09-11 NOTE — Assessment & Plan Note (Signed)
Thought prerenal due to diuretic use. Lasix restarted to help manage dCHF. Rechecking Cr today.

## 2011-09-11 NOTE — Assessment & Plan Note (Signed)
Previously well controlled however pt endorse some elevated sugars.  No changes - will need to monitor kidney function closely.

## 2011-09-11 NOTE — Patient Instructions (Signed)
Good to see you today. I want you to keep eye on symptoms along with weight. We will see what nutritionist plan for your daily intake will be. Good to see you today, call us with questions.

## 2011-09-11 NOTE — Progress Notes (Signed)
  Subjective:    Patient ID: [name], female    DOB: April 18, 1951, 61 y.o.   MRN: [id number]  HPI CC: questions about CHF  [name] feels frustrated today because having difficulty understanding how to manage her disease.  Has several questions regarding diastolic CHF, as well as diet.  Coming to realization that diastolic CHF is medical condition that she needs to take seriously.  Has been limiting salt intake to 1500mg .  Has decreased caloric intake to 1500 cal, prior at 1900cal.  Pending appt with medical nutritionist.  Brings log of weights showing fluctuations. Wt Readings from Last 3 Encounters:  09/11/11 322 lb (146.058 kg)  09/04/11 316 lb (143.337 kg)  08/31/11 320 lb (145.151 kg)   Sugar elevated fasting recently - 140-150. Lab Results  Component Value Date   HGBA1C 5.7* 08/20/2011    Review of Systems Per HPI    Objective:   Physical Exam  Nursing note and vitals reviewed. Constitutional: She appears well-developed and well-nourished. No distress.       Morbidly obese  HENT:  Head: Normocephalic and atraumatic.  Mouth/Throat: Oropharynx is clear and moist. No oropharyngeal exudate.  Eyes: Conjunctivae and EOM are normal. Pupils are equal, round, and reactive to light.  Neck: Normal range of motion. Neck supple.  Cardiovascular: Normal rate, regular rhythm, normal heart sounds and intact distal pulses.   No murmur heard.      Distant heart sounds  Pulmonary/Chest: Effort normal and breath sounds normal. No respiratory distress. She has no wheezes. She has no rales.       distant  Musculoskeletal: She exhibits no edema.  Lymphadenopathy:    She has no cervical adenopathy.  Skin: Skin is warm and dry. No rash noted.  Psychiatric: She has a normal mood and affect.      Assessment & Plan:

## 2011-09-12 LAB — BASIC METABOLIC PANEL
Chloride: 96 mEq/L (ref 96–112)
Potassium: 4.6 mEq/L (ref 3.5–5.1)

## 2011-09-13 ENCOUNTER — Other Ambulatory Visit: Payer: BC Managed Care – PPO

## 2011-09-13 ENCOUNTER — Other Ambulatory Visit: Payer: Self-pay | Admitting: Cardiology

## 2011-09-28 ENCOUNTER — Telehealth: Payer: Self-pay | Admitting: Family Medicine

## 2011-09-28 ENCOUNTER — Ambulatory Visit: Payer: Self-pay | Admitting: Family Medicine

## 2011-09-28 NOTE — Telephone Encounter (Signed)
Noted. Will see.

## 2011-09-28 NOTE — Telephone Encounter (Signed)
Caller: [name]/Patient; PCP: Eustaquio Boyden; CB#: [phone]; ; ; Call regarding Wt Gain; 7lb wt gain in 7 days, feet and ankle swelling, feels a quick " twinge" of CP when she's going up steps. Emergent sx r/o. Appt sched for 09/29/11 @ 0915 with Dr. Reece Agar. Edema Atraumatic Protocol.

## 2011-09-29 ENCOUNTER — Ambulatory Visit (INDEPENDENT_AMBULATORY_CARE_PROVIDER_SITE_OTHER): Payer: BC Managed Care – PPO | Admitting: Family Medicine

## 2011-09-29 ENCOUNTER — Encounter: Payer: Self-pay | Admitting: Family Medicine

## 2011-09-29 VITALS — BP 114/70 | HR 74 | Temp 99.0°F | Wt 327.8 lb

## 2011-09-29 DIAGNOSIS — I5030 Unspecified diastolic (congestive) heart failure: Secondary | ICD-10-CM

## 2011-09-29 DIAGNOSIS — R0600 Dyspnea, unspecified: Secondary | ICD-10-CM

## 2011-09-29 DIAGNOSIS — I5032 Chronic diastolic (congestive) heart failure: Secondary | ICD-10-CM

## 2011-09-29 DIAGNOSIS — I509 Heart failure, unspecified: Secondary | ICD-10-CM

## 2011-09-29 DIAGNOSIS — R0989 Other specified symptoms and signs involving the circulatory and respiratory systems: Secondary | ICD-10-CM

## 2011-09-29 DIAGNOSIS — E119 Type 2 diabetes mellitus without complications: Secondary | ICD-10-CM

## 2011-09-29 DIAGNOSIS — R0609 Other forms of dyspnea: Secondary | ICD-10-CM

## 2011-09-29 LAB — BASIC METABOLIC PANEL
CO2: 28 mEq/L (ref 19–32)
Chloride: 104 mEq/L (ref 96–112)
Potassium: 4.2 mEq/L (ref 3.5–5.1)
Sodium: 140 mEq/L (ref 135–145)

## 2011-09-29 LAB — BRAIN NATRIURETIC PEPTIDE: Pro B Natriuretic peptide (BNP): 238 pg/mL — ABNORMAL HIGH (ref 0.0–100.0)

## 2011-09-29 MED ORDER — GLYBURIDE-METFORMIN 1.25-250 MG PO TABS: 1.0000 | ORAL_TABLET | Freq: Two times a day (BID) | ORAL | Status: DC

## 2011-09-29 NOTE — Patient Instructions (Signed)
Blood work today to check on heart and kidneys. Increase lasix to 40mg  twice daily for next 3 days (one in am and one at noon) to see if any improvement. Call us on Monday with an update on how you're doing.

## 2011-09-29 NOTE — Progress Notes (Signed)
  Subjective:    Patient ID: [name], female    DOB: 23-Jan-1951, 61 y.o.   MRN: [id number]  HPI CC: weight gain, leg swelling  Pleasant 61 yo with /ho diastolic CHF presents with concerns over increased weight over last few days - endorses 9 lb weight gain in 1 wk.  Went to nutritionist yesterday for first time, "doing everything right".  Reduced calories to 1300/day.  Using program to track calories.  Parking at end of parking lot for more exercise.  Staying very fatigued for last week.  At end of day ankles very swollen.  Endorsing pleuritic twinge of chest pressure on right side of chest and some SOB worse with exertion.  Hasn't done water walking this week 2/2 right foot pain.  Also - recently found to have swollen optic nerve by optho - pending possible referal to neuro.  F/u scheduled for Monday.    Wt Readings from Last 3 Encounters:  09/29/11 327 lb 12 oz (148.666 kg)  09/11/11 322 lb (146.058 kg)  09/04/11 316 lb (143.337 kg)    Lab Results  Component Value Date   CREATININE 1.0 09/11/2011   Lab Results  Component Value Date   HGBA1C 5.7* 08/20/2011     Review of Systems Per HPI    Objective:   Physical Exam  Nursing note and vitals reviewed. Constitutional: She appears well-developed and well-nourished. No distress.       Morbidly obese  HENT:  Head: Normocephalic and atraumatic.  Mouth/Throat: Oropharynx is clear and moist. No oropharyngeal exudate.  Eyes: Conjunctivae and EOM are normal. Pupils are equal, round, and reactive to light.  Neck: Normal range of motion. Neck supple. No hepatojugular reflux and no JVD present.  Cardiovascular: Normal rate, regular rhythm, normal heart sounds and intact distal pulses.   No murmur heard. Pulmonary/Chest: Effort normal and breath sounds normal. No respiratory distress. She has no wheezes. She has no rales.  Musculoskeletal: She exhibits edema (nonpitting edema BLE).  Lymphadenopathy:    She has no cervical  adenopathy.  Skin: Skin is warm and dry. No rash noted.  Psychiatric: She has a normal mood and affect.       Assessment & Plan:

## 2011-09-29 NOTE — Assessment & Plan Note (Addendum)
On exam no significant evidence of CHF exacerbation, however story some concern for this - check BNP today.  Increase lasix to 40mg  bid for 3 days.  Will also check Cr. Encouraged to continue healthy diet changes she's made up to now and not be discouraged 2/2 no weight loss. I also asked [name] to check with pulm re: possible pulmonary contribution to significant dyspnea and fatigue she continues to experience, although OSA seems very stable and reports compliance with CPAP.

## 2011-09-29 NOTE — Assessment & Plan Note (Addendum)
Endorses fasting cbg in am trending up (150s), was only taking glucovance once daily.  rec increase to bid dosing.

## 2011-10-02 ENCOUNTER — Telehealth: Payer: Self-pay | Admitting: *Deleted

## 2011-10-02 NOTE — Telephone Encounter (Signed)
Let's go down to 40mg  once daily.  Keep eye on weight and symptoms, if trending up again to let us know. i'm glad she's feeling better.

## 2011-10-02 NOTE — Telephone Encounter (Signed)
Patient notified and will call with any problems or concerns.

## 2011-10-02 NOTE — Telephone Encounter (Signed)
Patient called and left message that she is now down 7 pounds. She said her feet swell if she is on them a lot but go down when she gets off of them. She was asking if she should continue the increased lasix or go back to her original dose. She is feeling better than she was the other day.

## 2011-10-05 ENCOUNTER — Encounter: Payer: Self-pay | Admitting: Family Medicine

## 2011-10-14 ENCOUNTER — Ambulatory Visit: Payer: Self-pay | Admitting: Family Medicine

## 2011-10-18 ENCOUNTER — Other Ambulatory Visit: Payer: BC Managed Care – PPO

## 2011-10-19 ENCOUNTER — Encounter: Payer: BC Managed Care – PPO | Admitting: Family Medicine

## 2011-10-24 ENCOUNTER — Encounter: Payer: BC Managed Care – PPO | Admitting: Family Medicine

## 2011-11-13 DIAGNOSIS — H469 Unspecified optic neuritis: Secondary | ICD-10-CM

## 2011-11-13 HISTORY — DX: Unspecified optic neuritis: H46.9

## 2011-11-17 ENCOUNTER — Other Ambulatory Visit: Payer: Self-pay | Admitting: Neurology

## 2011-11-17 DIAGNOSIS — H469 Unspecified optic neuritis: Secondary | ICD-10-CM

## 2011-11-23 ENCOUNTER — Encounter: Payer: Self-pay | Admitting: Family Medicine

## 2011-11-27 ENCOUNTER — Ambulatory Visit
Admission: RE | Admit: 2011-11-27 | Discharge: 2011-11-27 | Disposition: A | Discharge status: Home / Self Care | Payer: BC Managed Care – PPO | Source: Ambulatory Visit | Attending: Neurology | Admitting: Neurology

## 2011-11-27 DIAGNOSIS — H469 Unspecified optic neuritis: Secondary | ICD-10-CM

## 2011-11-27 MED ORDER — GADOBENATE DIMEGLUMINE 529 MG/ML IV SOLN
20.0000 mL | Freq: Once | INTRAVENOUS | Status: AC | PRN
  Administered 2011-11-27: 20 mL via INTRAVENOUS

## 2011-11-28 ENCOUNTER — Other Ambulatory Visit (INDEPENDENT_AMBULATORY_CARE_PROVIDER_SITE_OTHER): Payer: BC Managed Care – PPO

## 2011-11-28 DIAGNOSIS — E785 Hyperlipidemia, unspecified: Secondary | ICD-10-CM

## 2011-11-28 DIAGNOSIS — E039 Hypothyroidism, unspecified: Secondary | ICD-10-CM

## 2011-11-28 DIAGNOSIS — E538 Deficiency of other specified B group vitamins: Secondary | ICD-10-CM

## 2011-11-28 DIAGNOSIS — E119 Type 2 diabetes mellitus without complications: Secondary | ICD-10-CM

## 2011-11-28 LAB — COMPREHENSIVE METABOLIC PANEL
ALT: 19 U/L (ref 0–35)
BUN: 19 mg/dL (ref 6–23)
CO2: 26 mEq/L (ref 19–32)
Calcium: 9.2 mg/dL (ref 8.4–10.5)
Chloride: 106 mEq/L (ref 96–112)
Creatinine, Ser: 0.7 mg/dL (ref 0.4–1.2)
GFR: 90.86 mL/min (ref >60.00)
Glucose, Bld: 115 mg/dL — ABNORMAL HIGH (ref 70–99)

## 2011-11-28 LAB — TSH: TSH: 0.06 u[IU]/mL — ABNORMAL LOW (ref 0.35–5.50)

## 2011-11-28 LAB — LIPID PANEL
Cholesterol: 165 mg/dL (ref 0–200)
HDL: 58.3 mg/dL (ref >39.00)
LDL Cholesterol: 83 mg/dL (ref 0–99)
Triglycerides: 120 mg/dL (ref 0.0–149.0)
VLDL: 24 mg/dL (ref 0.0–40.0)

## 2011-11-28 LAB — VITAMIN B12: Vitamin B-12: 611 pg/mL (ref 211–911)

## 2011-12-01 ENCOUNTER — Encounter: Payer: Self-pay | Admitting: Family Medicine

## 2011-12-01 ENCOUNTER — Ambulatory Visit (INDEPENDENT_AMBULATORY_CARE_PROVIDER_SITE_OTHER): Payer: BC Managed Care – PPO | Admitting: Family Medicine

## 2011-12-01 VITALS — BP 119/71 | HR 64 | Temp 97.9°F | Resp 20 | Ht 65.75 in | Wt 319.2 lb

## 2011-12-01 DIAGNOSIS — I5032 Chronic diastolic (congestive) heart failure: Secondary | ICD-10-CM

## 2011-12-01 DIAGNOSIS — E039 Hypothyroidism, unspecified: Secondary | ICD-10-CM

## 2011-12-01 DIAGNOSIS — G2581 Restless legs syndrome: Secondary | ICD-10-CM

## 2011-12-01 DIAGNOSIS — E785 Hyperlipidemia, unspecified: Secondary | ICD-10-CM

## 2011-12-01 DIAGNOSIS — F329 Major depressive disorder, single episode, unspecified: Secondary | ICD-10-CM

## 2011-12-01 DIAGNOSIS — Z0000 Encounter for general adult medical examination without abnormal findings: Secondary | ICD-10-CM

## 2011-12-01 DIAGNOSIS — Z1211 Encounter for screening for malignant neoplasm of colon: Secondary | ICD-10-CM

## 2011-12-01 DIAGNOSIS — F3289 Other specified depressive episodes: Secondary | ICD-10-CM

## 2011-12-01 DIAGNOSIS — E119 Type 2 diabetes mellitus without complications: Secondary | ICD-10-CM

## 2011-12-01 MED ORDER — ROPINIROLE HCL 0.5 MG PO TABS: 0.5000 mg | ORAL_TABLET | Freq: Two times a day (BID) | ORAL | Status: DC

## 2011-12-01 NOTE — Assessment & Plan Note (Signed)
Followed by new psych, Dr. Archie Patten. On new meds - viibryd and lamictal. Will give more time to see if improved mood disorder.

## 2011-12-01 NOTE — Assessment & Plan Note (Addendum)
Preventative protocls reviewed and updated unless pt declined.  Discussed Tdap but forgot to provide - will give at next visit. Discussed healthy diet/lifestyle iFOB today, refer to GI for colonoscopy.

## 2011-12-01 NOTE — Assessment & Plan Note (Signed)
Lab Results  Component Value Date   HGBA1C 6.2 11/28/2011  great control per blood work. Continue glucovance.

## 2011-12-01 NOTE — Assessment & Plan Note (Signed)
Managing CHF well.  Follows daily weights.

## 2011-12-01 NOTE — Progress Notes (Signed)
Subjective:    Patient ID: [name], female    DOB: Sep 18, 1950, 61 y.o.   MRN: [id number]  HPI CC: CPE  Depression/anxiety - Changed psych from Dr. Imogene Burn to Dr. Archie Patten who did SureGene test and took her off SSRIs.  Started on viibryd as well as lamictal.  Giving this some time to see how she does.  Vision - Recently seen by Dr. Vickey Huger - concern for optic neuritis, to f/u with neuro in 4-6 wks.  Told MRI showed gliosis.  Has f/u with Dr. Vickey Huger.    OSA on CPAP - brings recent sleep study showing AHI 2.6 and adequate usage.  Sleep doctor is not pulm but neuro.  CHF - feels managing well.  Stopped going to see nutritionist, didn't feel was personalized to her situation but rather generic CHF diet.  Continued joint pains.  Left shoulder, wrist, bilateral knees, lower back.  Wonders about seeing rheumatologist.  Denies joint redness, swelling, or warmth.  Caffeine: 1 cup coffee/day Lives with daughter [facility]. Diet: watching diet, low sodium. Good water.  Fruits/vegetables daily.  Preventative: Well woman with OBGYN - normal pap and breast/mammo in past.  Last mammogram - 2012, normal. Has never had colonoscopy - states cannot tolerate prep.  However would like to try again.  Has even not tolerated gatorade/miralax prep in past.  Bad gag reflex. Due for tetanus shot today - but forgot to provide. Pneumonia shot done 02/2011 Recommended yearly flu shot.  Wt Readings from Last 3 Encounters:  12/01/11 319 lb 4 oz (144.811 kg)  09/29/11 327 lb 12 oz (148.666 kg)  09/11/11 322 lb (146.058 kg)   Medications and allergies reviewed and updated in chart.  Past histories reviewed and updated if relevant as below. Patient Active Problem List  Diagnosis  . HYPOTHYROIDISM  . DIABETES MELLITUS, TYPE II  . B12 DEFICIENCY  . Unspecified vitamin D deficiency  . HYPERLIPIDEMIA-MIXED  . OBESITY, MORBID  . DEPRESSION  . OBSTRUCTIVE SLEEP APNEA  .  CAD, NATIVE VESSEL  . DERMATITIS  . DIARRHEA  . INSOMNIA  . History of pneumonia  . Adrenal mass, left  . CHF EF 60% 10/13/2009 (inadequate study?)  . Atypical chest pain-non obstr cath 08/2011  . Intermediate coronary syndrome  . Coronary artery disease  . Diastolic heart failure  . Dyspnea  . Chronic diastolic heart failure   Past Medical History  Diagnosis Date  . Vitamin B 12 deficiency   . Anxiety   . Depression   . Hyperlipidemia   . Hypothyroid   . CAD (coronary artery disease)     nonobstructive plaque  . Obesity     morbid, seen dietician 09/2011  . T2DM (type 2 diabetes mellitus)   . OSA (obstructive sleep apnea)     medium quattro full face mask CPAP 10cm  . Vitamin d deficiency     last check 47 (11/2010)  . Fibula fracture   . Fatty liver 2006    [place]  . Optic neuritis 11/2011    neuro eval - pending MRI   Past Surgical History  Procedure Date  . Osteotomy and ulnar shortening 2001    [name]  . Knee surgery 11-02-2008    Partial medical and Lat Menisectomies and Condroplasty (Dr. Thurston Hole)  . Cardiac catheterization 10/12/09    EF 70%  . Mr abdomen 01/16/2011    Left 4cm adrenal myelolipoma  . Cardiac catheterization 08/23/2011    Mild nonobstructive  CAD, normal LV fxn  . US echocardiography 08/2011    EF of 55-60% and indeterminate diastolic function  . Esophagogastroduodenoscopy 2006   History  Substance Use Topics  . Smoking status: Former Smoker    Quit date: 08/09/1998  . Smokeless tobacco: Never Used  . Alcohol Use: No   Family History  Problem Relation Age of Onset  . Coronary artery disease Mother 24    14 angioplasty and 5v CABG  . Stroke Mother   . Diabetes Mother   . Heart attack Father 24  . Heart attack Brother 31    deceased age 32   Allergies  Allergen Reactions  . Erythromycin Base     REACTION: Vomitting  . Neomycin     vomiting    Current Outpatient Prescriptions on File Prior to Visit  Medication Sig Dispense Refill  .  ALPRAZolam (XANAX) 0.25 MG tablet Take 0.25 mg by mouth 2 (two) times daily.      Marland Kitchen aspirin EC 81 MG EC tablet Take 1 tablet (81 mg total) by mouth daily.  30 tablet  3  . atorvastatin (LIPITOR) 40 MG tablet TAKE 1 TABLET (40 MG TOTAL) BY MOUTH AT BEDTIME.  30 tablet  8  . cetirizine (ZYRTEC) 10 MG tablet Take 10 mg by mouth daily.      . cholecalciferol (VITAMIN D) 1000 UNITS tablet Take 1,000 Units by mouth daily.        . Cyanocobalamin (VITAMIN B-12 IJ) Inject as directed every 30 (thirty) days.      . furosemide (LASIX) 40 MG tablet Take 1 tablet (40 mg total) by mouth daily.  30 tablet  11  . glyBURIDE-metformin (GLUCOVANCE) 1.25-250 MG per tablet Take 1 tablet by mouth 2 (two) times daily with a meal.  60 tablet  11  . lamoTRIgine (LAMICTAL) 100 MG tablet Take 150 mg by mouth daily.       Marland Kitchen levothyroxine (SYNTHROID, LEVOTHROID) 200 MCG tablet Take 200 mcg by mouth daily. But once a week only take (1/2 pill)      . NEEDLE, DISP, 25 G (MONOJECT MAGELLAN SAFETY NDL) 25G X 1" MISC by Does not apply route as directed.        Marland Kitchen omeprazole (PRILOSEC) 40 MG capsule Take 40 mg by mouth daily.        . potassium chloride (K-DUR) 10 MEQ tablet Take 2 tablets (20 mEq total) by mouth daily.  60 tablet  3  . ramipril (ALTACE) 10 MG capsule TAKE 1 CAPSULE BY MOUTH EVERY DAY  90 capsule  0  . tiZANidine (ZANAFLEX) 4 MG capsule as needed.      . Vilazodone HCl 20 MG TABS Take 20 mg by mouth daily. Takes total 30 mg (also takes 10 mg tab).      . DISCONTD: rOPINIRole (REQUIP) 0.25 MG tablet Take 1 tablet (0.25 mg total) by mouth 3 (three) times daily.  90 tablet  11  . albuterol (PROVENTIL HFA;VENTOLIN HFA) 108 (90 BASE) MCG/ACT inhaler Inhale 2 puffs into the lungs every 6 (six) hours as needed for wheezing.  1 Inhaler  3   Review of Systems  Constitutional: Negative for fever, chills, activity change, appetite change, fatigue and unexpected weight change.  HENT: Negative for hearing loss and neck  pain.   Eyes: Negative for visual disturbance.  Respiratory: Negative for cough, chest tightness, shortness of breath and wheezing.   Cardiovascular: Negative for chest pain, palpitations and leg swelling.  Gastrointestinal: Negative  for nausea, vomiting, abdominal pain, diarrhea, constipation, blood in stool and abdominal distention.  Genitourinary: Negative for hematuria and difficulty urinating.  Musculoskeletal: Negative for myalgias and arthralgias.  Skin: Negative for rash.  Neurological: Negative for dizziness, seizures, syncope and headaches.  Hematological: Does not bruise/bleed easily.  Psychiatric/Behavioral: Negative for dysphoric mood. The patient is not nervous/anxious.        Objective:   Physical Exam  Nursing note and vitals reviewed. Constitutional: She is oriented to person, place, and time. She appears well-developed and well-nourished. No distress.       obese  HENT:  Head: Normocephalic and atraumatic.  Right Ear: Hearing, tympanic membrane, external ear and ear canal normal.  Left Ear: Hearing, tympanic membrane, external ear and ear canal normal.  Nose: Nose normal.  Mouth/Throat: Oropharynx is clear and moist.  Eyes: Conjunctivae and EOM are normal. Pupils are equal, round, and reactive to light.  Neck: Normal range of motion. Neck supple. Carotid bruit is not present. No thyromegaly present.  Cardiovascular: Normal rate, regular rhythm, normal heart sounds and intact distal pulses.   No murmur heard. Pulses:      Radial pulses are 2+ on the right side, and 2+ on the left side.  Pulmonary/Chest: Effort normal and breath sounds normal. No respiratory distress. She has no wheezes. She has no rales.  Abdominal: Soft. Bowel sounds are normal. She exhibits no distension and no mass. There is no tenderness. There is no rebound and no guarding.  Musculoskeletal: Normal range of motion. She exhibits no edema.  Lymphadenopathy:    She has no cervical adenopathy.    Neurological: She is alert and oriented to person, place, and time.       CN grossly intact, station and gait intact  Skin: Skin is warm and dry. No rash noted.  Psychiatric: She has a normal mood and affect. Her behavior is normal. Judgment and thought content normal.       Assessment & Plan:

## 2011-12-01 NOTE — Patient Instructions (Addendum)
Decrease synthroid (levothyroxine) by 1/2 pill one day a week (every day take except on Wednesday take ).  Return in 6 weeks for recheck. Stool kit today, pass by [facility]'s office for referral to gastroenterologist for colonsocopy - check with them on prep options. Try tylenol 500mg  twice daily for pain, if not better let me know. Increase requip to 0.5mg  twice daily. Good to see you today, call us with questions.

## 2011-12-01 NOTE — Assessment & Plan Note (Signed)
Lab Results  Component Value Date   TSH 0.06* 11/28/2011  Discussed overtreatment- decrease levothyroxine by /wk.  recheck in 6 wks.

## 2011-12-01 NOTE — Assessment & Plan Note (Signed)
Lab Results  Component Value Date   CHOL 165 11/28/2011   HDL 58.30 11/28/2011   LDLCALC 83 11/28/2011   TRIG 120.0 11/28/2011   CHOLHDL 3 11/28/2011  chronic, stable on statin.

## 2011-12-01 NOTE — Assessment & Plan Note (Signed)
On requip for RLS - was on 0.25mg  tid, unsure why this dosing.   Recent worsening RLS, doubt related to IDA. Will change to 0.5mg  bid, consider changing to 1mg  qhs if not controlled.

## 2011-12-08 ENCOUNTER — Other Ambulatory Visit: Payer: BC Managed Care – PPO

## 2011-12-08 DIAGNOSIS — Z1211 Encounter for screening for malignant neoplasm of colon: Secondary | ICD-10-CM

## 2011-12-13 ENCOUNTER — Other Ambulatory Visit: Payer: Self-pay | Admitting: Family Medicine

## 2011-12-22 ENCOUNTER — Telehealth: Payer: Self-pay | Admitting: Family Medicine

## 2011-12-22 ENCOUNTER — Ambulatory Visit: Payer: BC Managed Care – PPO | Admitting: Family Medicine

## 2011-12-22 NOTE — Telephone Encounter (Signed)
Caller: Masha/Patient; Patient Name: [name]; PCP: Judy Pimple.; Best Callback Phone Number: [phone] Upon callback, Patient states that she has already spoken with Selena Batten from the office and cancelled her appointment for today with Dr. Milinda Antis. Declines triage.

## 2011-12-25 NOTE — Telephone Encounter (Signed)
Noted. Thanks.

## 2012-01-03 ENCOUNTER — Ambulatory Visit: Payer: BC Managed Care – PPO | Admitting: Gastroenterology

## 2012-01-06 ENCOUNTER — Other Ambulatory Visit: Payer: Self-pay | Admitting: Family Medicine

## 2012-01-12 ENCOUNTER — Other Ambulatory Visit: Payer: BC Managed Care – PPO

## 2012-01-24 ENCOUNTER — Other Ambulatory Visit: Payer: Self-pay | Admitting: Family Medicine

## 2012-01-24 ENCOUNTER — Other Ambulatory Visit (INDEPENDENT_AMBULATORY_CARE_PROVIDER_SITE_OTHER): Payer: BC Managed Care – PPO

## 2012-01-24 DIAGNOSIS — E039 Hypothyroidism, unspecified: Secondary | ICD-10-CM

## 2012-01-24 MED ORDER — LEVOTHYROXINE SODIUM 175 MCG PO TABS: 175.0000 ug | ORAL_TABLET | Freq: Every day | ORAL | Status: DC

## 2012-01-29 ENCOUNTER — Ambulatory Visit (INDEPENDENT_AMBULATORY_CARE_PROVIDER_SITE_OTHER): Payer: BC Managed Care – PPO | Admitting: Family Medicine

## 2012-01-29 ENCOUNTER — Encounter: Payer: Self-pay | Admitting: Family Medicine

## 2012-01-29 VITALS — BP 132/76 | HR 76 | Temp 97.8°F | Wt 312.0 lb

## 2012-01-29 DIAGNOSIS — Z23 Encounter for immunization: Secondary | ICD-10-CM

## 2012-01-29 DIAGNOSIS — I5032 Chronic diastolic (congestive) heart failure: Secondary | ICD-10-CM

## 2012-01-29 DIAGNOSIS — Z8701 Personal history of pneumonia (recurrent): Secondary | ICD-10-CM

## 2012-01-29 DIAGNOSIS — G2581 Restless legs syndrome: Secondary | ICD-10-CM

## 2012-01-29 DIAGNOSIS — E119 Type 2 diabetes mellitus without complications: Secondary | ICD-10-CM

## 2012-01-29 DIAGNOSIS — E039 Hypothyroidism, unspecified: Secondary | ICD-10-CM

## 2012-01-29 MED ORDER — LEVOTHYROXINE SODIUM 175 MCG PO TABS: 175.0000 ug | ORAL_TABLET | Freq: Every day | ORAL | Status: DC

## 2012-01-29 MED ORDER — ASPIRIN 81 MG PO TBEC: 81.0000 mg | DELAYED_RELEASE_TABLET | Freq: Every day | ORAL | Status: DC

## 2012-01-29 MED ORDER — OMEPRAZOLE 40 MG PO CPDR: 40.0000 mg | DELAYED_RELEASE_CAPSULE | Freq: Every day | ORAL | Status: DC

## 2012-01-29 MED ORDER — GLYBURIDE-METFORMIN 1.25-250 MG PO TABS: 1.0000 | ORAL_TABLET | Freq: Every day | ORAL | Status: DC

## 2012-01-29 MED ORDER — ATORVASTATIN CALCIUM 40 MG PO TABS: 40.0000 mg | ORAL_TABLET | Freq: Every day | ORAL | Status: DC

## 2012-01-29 MED ORDER — FUROSEMIDE 40 MG PO TABS: 40.0000 mg | ORAL_TABLET | Freq: Every day | ORAL | Status: DC

## 2012-01-29 MED ORDER — ROPINIROLE HCL 1 MG PO TABS: 1.0000 mg | ORAL_TABLET | Freq: Every day | ORAL | Status: DC

## 2012-01-29 MED ORDER — POTASSIUM CHLORIDE ER 10 MEQ PO TBCR: 20.0000 meq | EXTENDED_RELEASE_TABLET | Freq: Every day | ORAL | Status: DC

## 2012-01-29 MED ORDER — RAMIPRIL 10 MG PO CAPS: 10.0000 mg | ORAL_CAPSULE | Freq: Every day | ORAL | Status: DC

## 2012-01-29 NOTE — Addendum Note (Signed)
Addended by: Josph Macho A on: 01/29/2012 08:50 AM   Modules accepted: Orders

## 2012-01-29 NOTE — Assessment & Plan Note (Signed)
Chronic, stable. Continue lasix daily with potassium. Seems to have good grasp of increased diuretic with increased weight.

## 2012-01-29 NOTE — Progress Notes (Signed)
  Subjective:    Patient ID: [name], female    DOB: 08-28-1950, 61 y.o.   MRN: [id number]  HPI CC: 2 mo f/u  [place] ending shortly.  Requests 90d supply of meds.    DM - only taking glucovance once daily, has never taken twice daily.  Hypothyroid - takes synthroid daily, did not decrease dose.  RLS - worse at night - taking currently requip 0.5mg  bid.    CHF - had 3 episodes where weight increased.  Knows to increase lasix for 3 days if notices weight coming up.  Did not have dyspnea with this.  Weight - frustrated about slow weight loss, interested in herbalife.  Wt Readings from Last 3 Encounters:  01/29/12 312 lb (141.522 kg)  12/01/11 319 lb 4 oz (144.811 kg)  09/29/11 327 lb 12 oz (148.666 kg)   Flu shot and tetanus shot today.  Review of Systems Per HPI    Objective:   Physical Exam  Nursing note and vitals reviewed. Constitutional: She appears well-developed and well-nourished. No distress.  HENT:  Head: Normocephalic and atraumatic.  Mouth/Throat: Oropharynx is clear and moist. No oropharyngeal exudate.  Eyes: Conjunctivae normal and EOM are normal. Pupils are equal, round, and reactive to light.  Neck: Normal range of motion. Neck supple. No thyromegaly present.  Cardiovascular: Normal rate, regular rhythm, normal heart sounds and intact distal pulses.   No murmur heard. Pulmonary/Chest: Effort normal and breath sounds normal. No respiratory distress. She has no wheezes. She has no rales.  Musculoskeletal: She exhibits no edema.  Lymphadenopathy:    She has no cervical adenopathy.  Skin: Skin is warm and dry. No rash noted.       Assessment & Plan:

## 2012-01-29 NOTE — Assessment & Plan Note (Signed)
Will rec pna shot at age 61yo.

## 2012-01-29 NOTE — Assessment & Plan Note (Signed)
changed glucovance dosing to qd. Lab Results  Component Value Date   HGBA1C 6.2 11/28/2011

## 2012-01-29 NOTE — Assessment & Plan Note (Signed)
Increase requip to 1mg  nightly.

## 2012-01-29 NOTE — Assessment & Plan Note (Signed)
Encouraged, discussed slow weight loss. Wt Readings from Last 3 Encounters:  01/29/12 312 lb (141.522 kg)  12/01/11 319 lb 4 oz (144.811 kg)  09/29/11 327 lb 12 oz (148.666 kg)  pt interested in herbalife.

## 2012-01-29 NOTE — Assessment & Plan Note (Signed)
Did not decrease previous dose. Will decrease to daily - sent new dose to pharmacy. Return in 4-6 wks for recheck labwork.

## 2012-01-29 NOTE — Patient Instructions (Signed)
Flu shot today. Tetanus and pertussis shots today. Return in 6 weeks to recheck thyroid function.  Start thyroid medication at daily - new prescription at pharmacy. Return in 4-6 months for follow up Good to see you today, call us with questions.

## 2012-01-31 ENCOUNTER — Encounter: Payer: Self-pay | Admitting: Family Medicine

## 2012-02-02 ENCOUNTER — Ambulatory Visit: Payer: BC Managed Care – PPO | Admitting: Internal Medicine

## 2012-02-17 ENCOUNTER — Other Ambulatory Visit: Payer: Self-pay | Admitting: Family Medicine

## 2012-02-18 NOTE — Telephone Encounter (Signed)
plz phone in. 

## 2012-02-19 ENCOUNTER — Telehealth: Payer: Self-pay | Admitting: Cardiology

## 2012-02-19 ENCOUNTER — Encounter: Payer: Self-pay | Admitting: Family Medicine

## 2012-02-19 ENCOUNTER — Ambulatory Visit (INDEPENDENT_AMBULATORY_CARE_PROVIDER_SITE_OTHER): Payer: BC Managed Care – PPO | Admitting: Family Medicine

## 2012-02-19 ENCOUNTER — Ambulatory Visit (INDEPENDENT_AMBULATORY_CARE_PROVIDER_SITE_OTHER): Payer: BC Managed Care – PPO | Admitting: General Surgery

## 2012-02-19 ENCOUNTER — Encounter (INDEPENDENT_AMBULATORY_CARE_PROVIDER_SITE_OTHER): Payer: Self-pay | Admitting: General Surgery

## 2012-02-19 ENCOUNTER — Ambulatory Visit (INDEPENDENT_AMBULATORY_CARE_PROVIDER_SITE_OTHER)
Admission: RE | Admit: 2012-02-19 | Discharge: 2012-02-19 | Disposition: A | Discharge status: Home / Self Care | Payer: BC Managed Care – PPO | Source: Ambulatory Visit | Attending: Family Medicine | Admitting: Family Medicine

## 2012-02-19 VITALS — BP 118/64 | HR 76 | Temp 98.5°F | Wt 305.8 lb

## 2012-02-19 VITALS — BP 120/78 | HR 78 | Temp 98.9°F | Resp 18 | Ht 66.5 in | Wt 303.0 lb

## 2012-02-19 DIAGNOSIS — R109 Unspecified abdominal pain: Secondary | ICD-10-CM | POA: Insufficient documentation

## 2012-02-19 DIAGNOSIS — K420 Umbilical hernia with obstruction, without gangrene: Secondary | ICD-10-CM

## 2012-02-19 DIAGNOSIS — E538 Deficiency of other specified B group vitamins: Secondary | ICD-10-CM

## 2012-02-19 LAB — CBC WITH DIFFERENTIAL/PLATELET
Basophils Relative: 0.9 % (ref 0.0–3.0)
Eosinophils Relative: 2.7 % (ref 0.0–5.0)
Lymphocytes Relative: 21.4 % (ref 12.0–46.0)
Neutrophils Relative %: 67.2 % (ref 43.0–77.0)
Platelets: 274 10*3/uL (ref 150.0–400.0)
RBC: 4.98 Mil/uL (ref 3.87–5.11)
WBC: 7.2 10*3/uL (ref 4.5–10.5)

## 2012-02-19 LAB — COMPREHENSIVE METABOLIC PANEL
AST: 18 U/L (ref 0–37)
Albumin: 4 g/dL (ref 3.5–5.2)
BUN: 26 mg/dL — ABNORMAL HIGH (ref 6–23)
Calcium: 9.4 mg/dL (ref 8.4–10.5)
Chloride: 101 mEq/L (ref 96–112)
Glucose, Bld: 69 mg/dL — ABNORMAL LOW (ref 70–99)
Potassium: 3.9 mEq/L (ref 3.5–5.1)
Sodium: 138 mEq/L (ref 135–145)
Total Protein: 7.6 g/dL (ref 6.0–8.3)

## 2012-02-19 MED ORDER — IOHEXOL 300 MG/ML  SOLN
100.0000 mL | Freq: Once | INTRAMUSCULAR | Status: AC | PRN
  Administered 2012-02-19: 100 mL via INTRAVENOUS

## 2012-02-19 MED ORDER — CYANOCOBALAMIN 1000 MCG/ML IJ SOLN: 1000.0000 ug | INTRAMUSCULAR | Status: DC

## 2012-02-19 NOTE — Telephone Encounter (Signed)
Rx called in as directed.   

## 2012-02-19 NOTE — Addendum Note (Signed)
Addended by: Alvina Chou on: 02/19/2012 11:15 AM   Modules accepted: Orders

## 2012-02-19 NOTE — Patient Instructions (Signed)
I am worried that this abdominal pain may be coming from the umbilical hernia - it seems larger than in the past Blood work today.  We will have you pass by [facility]'s office to schedule CT scan as well. We will call you with results and possibly schedule you with surgery for further evaluation. If the pain gets at all worse, or you start having fevers, nausea/vomiting, please let us know

## 2012-02-19 NOTE — Telephone Encounter (Signed)
[name] calling from [facility] wanting surgical clearance for [name]--[name] needs hernia repair and they would like to do later this week--please call [facility] and ask for [name] if you have any questions--if OK , please send them clearance--if pt needs to be seen first, please call and let them know--[phone]

## 2012-02-19 NOTE — Progress Notes (Signed)
Patient ID: [name], female   DOB: 02/08/1951, 61 y.o.   MRN: [id number]  Chief Complaint  Patient presents with  . Umbilical Hernia    HPI Yuriko K Wolden is a 61 y.o. female is referred by Dr. Gutierrez for incarcerated umbilical hernia. He states that patient has had a known umbilical hernia for 10-12 years and has not had any issues in the last several days. He states that approximately 5 days ago patient had swelling and induration of her umbilical hernia and was unable to reduce the contents. Patient since that time has had abdominal pain radiating to the left abdominal area with incarceration of her umbilical hernia. The patient underwent CT scan revealed omentum within the hernia sac without any bowel contents. HPI  Past Medical History  Diagnosis Date  . Vitamin B 12 deficiency   . Anxiety   . Depression   . Hyperlipidemia   . Hypothyroid   . CAD (coronary artery disease)     nonobstructive plaque  . Obesity     morbid, seen dietician 09/2011  . T2DM (type 2 diabetes mellitus)   . OSA (obstructive sleep apnea)     medium quattro full face mask CPAP 10cm  . Vitamin D deficiency     last check 47 (11/2010)  . Fibula fracture   . Fatty liver 2006    [place]  . 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 Diastolic CHF 08/2011    EF 55-60%, nl valves  . RLS (restless legs syndrome)   . Arthritis   . GERD (gastroesophageal reflux disease)   . Hypertension     Past Surgical History  Procedure Date  . Osteotomy and ulnar shortening 2001    [name]  . Knee surgery 11-02-2008    Partial medical and Lat Menisectomies and Condroplasty (Dr. Wainer)  . Cardiac catheterization 10/12/09    EF 70%  . Mr abdomen 01/16/2011    Left 4cm adrenal myelolipoma  . Cardiac catheterization 08/23/2011    Mild nonobstructive CAD, normal LV fxn  . Us echocardiography 08/2011    EF of 55-60% and indeterminate diastolic function  . Esophagogastroduodenoscopy 2006    Family  History  Problem Relation Age of Onset  . Coronary artery disease Mother 78    14 angioplasty and 5v CABG  . Stroke Mother   . Diabetes Mother   . Heart attack Father 58  . Heart attack Brother 40    deceased age 55    Social History History  Substance Use Topics  . Smoking status: Former Smoker    Quit date: 08/08/1997  . Smokeless tobacco: Never Used  . Alcohol Use: No    Allergies  Allergen Reactions  . Erythromycin Base     REACTION: Vomitting  . Neomycin     vomiting     Current Outpatient Prescriptions  Medication Sig Dispense Refill  . albuterol (PROVENTIL HFA;VENTOLIN HFA) 108 (90 BASE) MCG/ACT inhaler Inhale 2 puffs into the lungs every 6 (six) hours as needed for wheezing.  1 Inhaler  3  . ALPRAZolam (XANAX) 0.25 MG tablet TAKE 1 TABLET BY MOUTH TWICE A DAY  60 tablet  0  . aspirin 81 MG EC tablet Take 1 tablet (81 mg total) by mouth daily.  90 tablet  3  . atorvastatin (LIPITOR) 40 MG tablet Take 1 tablet (40 mg total) by mouth at bedtime.  90 tablet  3  . cetirizine (ZYRTEC) 10 MG tablet   Take 10 mg by mouth daily.      . cholecalciferol (VITAMIN D) 1000 UNITS tablet Take 1,000 Units by mouth daily.        . cyanocobalamin (,VITAMIN B-12,) 1000 MCG/ML injection Inject 1 mL (1,000 mcg total) into the muscle every 30 (thirty) days.  10 mL  3  . furosemide (LASIX) 40 MG tablet Take 1 tablet (40 mg total) by mouth daily.  90 tablet  3  . glyBURIDE-metformin (GLUCOVANCE) 1.25-250 MG per tablet Take 1 tablet by mouth daily with breakfast.  90 tablet  3  . lamoTRIgine (LAMICTAL) 150 MG tablet Take 150 mg by mouth daily.      . levothyroxine (SYNTHROID, LEVOTHROID) 175 MCG tablet Take 1 tablet (175 mcg total) by mouth daily.  90 tablet  3  . NEEDLE, DISP, 25 G (MONOJECT MAGELLAN SAFETY NDL) 25G X 1" MISC by Does not apply route as directed.        . nystatin ointment (MYCOSTATIN) Apply 1 application topically as needed.      . omeprazole (PRILOSEC) 40 MG capsule Take 1  capsule (40 mg total) by mouth daily.  90 capsule  3  . potassium chloride (KLOR-CON 10) 10 MEQ tablet Take 2 tablets (20 mEq total) by mouth daily.  180 tablet  3  . ramipril (ALTACE) 10 MG capsule Take 1 capsule (10 mg total) by mouth daily.  90 capsule  3  . rOPINIRole (REQUIP) 1 MG tablet Take 1 tablet (1 mg total) by mouth at bedtime.  90 tablet  3  . tiZANidine (ZANAFLEX) 4 MG capsule as needed.      . Vilazodone HCl (VIIBRYD) 10 MG TABS Take 40 mg by mouth daily.       . Vilazodone HCl 20 MG TABS Take 20 mg by mouth daily. Takes total 30 mg (also takes 10 mg tab).       No current facility-administered medications for this visit.   Facility-Administered Medications Ordered in Other Visits  Medication Dose Route Frequency Provider Last Rate Last Dose  . iohexol (OMNIPAQUE) 300 MG/ML solution 100 mL  100 mL Intravenous Once PRN Medication Radiologist, MD   100 mL at 02/19/12 1503    Review of Systems Review of Systems  Constitutional: Negative.   HENT: Negative.   Eyes: Negative.   Cardiovascular: Negative.   Gastrointestinal: Positive for abdominal pain.  Neurological: Negative.     Blood pressure 120/78, pulse 78, temperature 98.9 F (37.2 C), temperature source Oral, resp. rate 18, height 5' 6.5" (1.689 m), weight 303 lb (137.44 kg).  Physical Exam Physical Exam  Constitutional: She is oriented to person, place, and time. She appears well-developed.  HENT:  Head: Normocephalic and atraumatic.  Eyes: Conjunctivae normal are normal.  Neck: Normal range of motion. Neck supple.  Cardiovascular: Normal rate and regular rhythm.   Pulmonary/Chest: Effort normal.  Abdominal: Soft. She exhibits no mass. There is tenderness (umbilicus). There is no rebound. A hernia is present. Hernia confirmed positive in the ventral area.         Incarcerated umbilical hernia  Neurological: She is alert and oriented to person, place, and time.    Data Reviewed CT scan reviewed which  revealed A. Approximately 2.3 cm hernia with omentum and without bowel contents and  Assessment    The patient is a 61-year-old female with an incarcerated umbilical hernia. Patient has had diagnosis of congestive heart failure and see Dr. Wall. We will seek cardiac evaluation per Dr. Wall   and expedite scheduling of the laparoscopic umbilical hernia repair and mesh.    Plan    1. Cardiac evaluation per Dr. Wall.   2. Wife for scheduling of a laparoscopic umbilical hernia repair possible open  3. All risks and benefits were discussed with the patient, to generally include infection, bleeding, damage to surrounding structures, and recurrence. Alternatives were offered and described.  All questions were answered and the patient voiced understanding of the procedure and wishes to proceed at this point.        Tymon Nemetz Jr., Yani Lal 02/19/2012, 3:54 PM    

## 2012-02-19 NOTE — Assessment & Plan Note (Addendum)
Anticipate pain coming from enlarging umbilical hernia, which today is not fully reducible.  Has never had surgery eval - will refer today.   Will also obtain CMP/CBC and abd/pelvic CT with contrast to further evaluate enlarging umbilical hernia and/or other intraabd etiology of current pain. Nontoxic today, not severe pain, ok for outpt workup. Discussed red flags to seek urgent care. Pt agrees with plan.

## 2012-02-19 NOTE — Progress Notes (Signed)
Subjective:    Patient ID: [name], female    DOB: [birth year], 61 y.o.   MRN: [id number]  HPI CC: abd discomfort  For last 4 days having pain in side - starts LUQ and travels to [place] near prior umbilical hernia, associated with pressure sensation and urge to defecate.  Normal stools - actually IBS sxs improved since started herbalife.  Using herbalife aloe - thinking about holding omeprazole.  Pressure pain/discomfort improved with BM.  No nausea/vomiting, fevers/chills, diarrhea, constipation.  + increased gassiness and bloating.  No changes in voiding, blood in urine, blood in stool.  Leaving for 1 wk trip to beach today. Flu shot done last month. Requests B12 refill. Requests renewal for short term handicap placard.  Arthritis worsens with hip pain with weather changes.  Has never had colonsocopy 2/2 inability to tolerate prep.  iFOB negative 11/2011.  Continues to work hard on weight loss, doing great! Wt Readings from Last 3 Encounters:  02/19/12 305 lb 12 oz (138.687 kg)  01/29/12 312 lb (141.522 kg)  12/01/11 319 lb 4 oz (144.811 kg)    Medications and allergies reviewed and updated in chart.  Past histories reviewed and updated if relevant as below. Patient Active Problem List  Diagnosis  . HYPOTHYROIDISM  . DIABETES MELLITUS, TYPE II  . B12 DEFICIENCY  . Unspecified vitamin D deficiency  . HYPERLIPIDEMIA-MIXED  . OBESITY, MORBID  . DEPRESSION  . OBSTRUCTIVE SLEEP APNEA  . CAD, NATIVE VESSEL  . DERMATITIS  . INSOMNIA  . History of pneumonia  . Adrenal mass, left  . Atypical chest pain-non obstr cath 08/2011  . Intermediate coronary syndrome  . Coronary artery disease  . Dyspnea  . Chronic diastolic heart failure  . Healthcare maintenance  . RLS (restless legs syndrome)  . Abdominal  pain, other specified site   Past Medical History  Diagnosis Date  . Vitamin B 12 deficiency   . Anxiety   . Depression   . Hyperlipidemia   . Hypothyroid   . CAD  (coronary artery disease)     nonobstructive plaque  . Obesity     morbid, seen dietician 09/2011  . T2DM (type 2 diabetes mellitus)   . OSA (obstructive sleep apnea)     medium quattro full face mask CPAP 10cm  . Vitamin D deficiency     last check 47 (11/2010)  . Fibula fracture   . Fatty liver 2006    [place]  . 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 Diastolic CHF 08/2011    EF 55-60%, nl valves  . RLS (restless legs syndrome)    Past Surgical History  Procedure Date  . Osteotomy and ulnar shortening 2001    [name]  . Knee surgery 11-02-2008    Partial medical and Lat Menisectomies and Condroplasty (Dr. Thurston Hole)  . Cardiac catheterization 10/12/09    EF 70%  . Mr abdomen 01/16/2011    Left 4cm adrenal myelolipoma  . Cardiac catheterization 08/23/2011    Mild nonobstructive CAD, normal LV fxn  . US echocardiography 08/2011    EF of 55-60% and indeterminate diastolic function  . Esophagogastroduodenoscopy 2006   History  Substance Use Topics  . Smoking status: Former Smoker    Quit date: 08/09/1998  . Smokeless tobacco: Never Used  . Alcohol Use: No   Family History  Problem Relation Age of Onset  . Coronary artery disease Mother 7    14 angioplasty and 5v CABG  .  Stroke Mother   . Diabetes Mother   . Heart attack Father 70  . Heart attack Brother 40    deceased age 60   Allergies  Allergen Reactions  . Erythromycin Base     REACTION: Vomitting  . Neomycin     vomiting    Current Outpatient Prescriptions on File Prior to Visit  Medication Sig Dispense Refill  . albuterol (PROVENTIL HFA;VENTOLIN HFA) 108 (90 BASE) MCG/ACT inhaler Inhale 2 puffs into the lungs every 6 (six) hours as needed for wheezing.  1 Inhaler  3  . ALPRAZolam (XANAX) 0.25 MG tablet TAKE 1 TABLET BY MOUTH TWICE A DAY  60 tablet  0  . aspirin 81 MG EC tablet Take 1 tablet (81 mg total) by mouth daily.  90 tablet  3  . atorvastatin (LIPITOR) 40 MG tablet Take 1 tablet (40 mg total) by  mouth at bedtime.  90 tablet  3  . cetirizine (ZYRTEC) 10 MG tablet Take 10 mg by mouth daily.      . cholecalciferol (VITAMIN D) 1000 UNITS tablet Take 1,000 Units by mouth daily.        . furosemide (LASIX) 40 MG tablet Take 1 tablet (40 mg total) by mouth daily.  90 tablet  3  . glyBURIDE-metformin (GLUCOVANCE) 1.25-250 MG per tablet Take 1 tablet by mouth daily with breakfast.  90 tablet  3  . lamoTRIgine (LAMICTAL) 100 MG tablet Take 150 mg by mouth daily.       Marland Kitchen levothyroxine (SYNTHROID, LEVOTHROID) 175 MCG tablet Take 1 tablet (175 mcg total) by mouth daily.  90 tablet  3  . NEEDLE, DISP, 25 G (MONOJECT MAGELLAN SAFETY NDL) 25G X 1" MISC by Does not apply route as directed.        . nystatin ointment (MYCOSTATIN) Apply 1 application topically as needed.      Marland Kitchen omeprazole (PRILOSEC) 40 MG capsule Take 1 capsule (40 mg total) by mouth daily.  90 capsule  3  . potassium chloride (KLOR-CON 10) 10 MEQ tablet Take 2 tablets (20 mEq total) by mouth daily.  180 tablet  3  . ramipril (ALTACE) 10 MG capsule Take 1 capsule (10 mg total) by mouth daily.  90 capsule  3  . rOPINIRole (REQUIP) 1 MG tablet Take 1 tablet (1 mg total) by mouth at bedtime.  90 tablet  3  . Vilazodone HCl (VIIBRYD) 10 MG TABS Take 40 mg by mouth daily.       . Vilazodone HCl 20 MG TABS Take 20 mg by mouth daily. Takes total 30 mg (also takes 10 mg tab).      Marland Kitchen tiZANidine (ZANAFLEX) 4 MG capsule as needed.         Review of Systems Per HPI    Objective:   Physical Exam  Nursing note and vitals reviewed. Constitutional: She appears well-developed and well-nourished. No distress.       Morbidly obese  Abdominal: Soft. Bowel sounds are normal. She exhibits no distension and no mass. There is no hepatosplenomegaly. There is tenderness in the periumbilical area and left lower quadrant. There is no rigidity, no rebound, no guarding and negative Murphy's sign. A hernia is present.       Umbilical hernia present, hardened,  about 3.5cm diameter.  Not completely reducible.  Musculoskeletal: She exhibits no edema.  Skin: Skin is warm and dry. No rash noted.  Psychiatric: She has a normal mood and affect.       Assessment &  Plan:

## 2012-02-19 NOTE — Telephone Encounter (Signed)
New message:  Pt needs a hernia repair surgery and they would like to know if she needs pre op clearance Before this surgery.  Call Pattricia Boss back at [phone] ASAP to advise. Aware that Dr. Vern Claude nurse is off today.

## 2012-02-19 NOTE — Assessment & Plan Note (Signed)
Refilled B12 shots.

## 2012-02-20 ENCOUNTER — Other Ambulatory Visit (INDEPENDENT_AMBULATORY_CARE_PROVIDER_SITE_OTHER): Payer: Self-pay | Admitting: General Surgery

## 2012-02-20 NOTE — Telephone Encounter (Signed)
She can be cleared for surgery. She does not need to see me.

## 2012-02-20 NOTE — Telephone Encounter (Signed)
**Note De-Identified Dayana Dalporto Obfuscation** This note faxed to Dr. Derrell Lolling' office per Annie's request./LV

## 2012-02-21 ENCOUNTER — Encounter (INDEPENDENT_AMBULATORY_CARE_PROVIDER_SITE_OTHER): Payer: Self-pay

## 2012-02-22 ENCOUNTER — Encounter (HOSPITAL_COMMUNITY): Payer: Self-pay | Admitting: Pharmacy Technician

## 2012-02-22 ENCOUNTER — Encounter: Payer: Self-pay | Admitting: *Deleted

## 2012-02-27 NOTE — Patient Instructions (Signed)
[address] [name]  02/27/2012   Your procedure is scheduled on:  0730am-0930am  03/01/12   Report to [facility] at 0530 AM.  Call this number if you have problems the morning of surgery: [phone]   Remember:   Do not eat food:After Midnight.  May have clear liquids:until Midnight .  Marland Kitchen  Take these medicines the morning of surgery with A SIP OF WATER:    Do not wear jewelry, make-up or nail polish.  Do not wear lotions, powders, or perfumes.   Do not shave 48 hours prior to surgery.   Do not bring valuables to the hospital.  Contacts, dentures or bridgework may not be worn into surgery.      Patients discharged the day of surgery will not be allowed to drive home.  Name and phone number of your driver:  SEE CHG INSTRUCTION SHEET    Please read over the following fact sheets that you were given: MRSA Information, coughing and deep breathing exercises, leg exercises

## 2012-02-28 ENCOUNTER — Encounter (HOSPITAL_COMMUNITY)
Admission: RE | Admit: 2012-02-28 | Discharge: 2012-02-28 | Disposition: A | Discharge status: Home / Self Care | Payer: BC Managed Care – PPO | Source: Ambulatory Visit | Attending: General Surgery | Admitting: General Surgery

## 2012-02-28 ENCOUNTER — Encounter (HOSPITAL_COMMUNITY): Payer: Self-pay

## 2012-02-28 HISTORY — DX: Adverse effect of unspecified anesthetic, initial encounter: T41.45XA

## 2012-02-28 HISTORY — DX: Other complications of anesthesia, initial encounter: T88.59XA

## 2012-02-28 LAB — CBC
Hemoglobin: 13.9 g/dL (ref 12.0–15.0)
MCH: 27.1 pg (ref 26.0–34.0)
RBC: 5.13 MIL/uL — ABNORMAL HIGH (ref 3.87–5.11)

## 2012-02-28 LAB — BASIC METABOLIC PANEL
CO2: 28 mEq/L (ref 19–32)
Chloride: 98 mEq/L (ref 96–112)
Glucose, Bld: 99 mg/dL (ref 70–99)
Potassium: 3.4 mEq/L — ABNORMAL LOW (ref 3.5–5.1)
Sodium: 138 mEq/L (ref 135–145)

## 2012-02-28 LAB — SURGICAL PCR SCREEN
MRSA, PCR: NEGATIVE
Staphylococcus aureus: POSITIVE — AB

## 2012-02-28 NOTE — Progress Notes (Signed)
Last office visit with Dr Juanito Doom( cardiologist ) 09/04/11 [facility]  ECHO 08/20/11 [facility]  EKG 08/22/11 [facility]  CXR 08/20/11 [facility]

## 2012-02-28 NOTE — Progress Notes (Signed)
CPAP settings are under Media- note dated 12/08/11.

## 2012-03-01 ENCOUNTER — Observation Stay (HOSPITAL_COMMUNITY)
Admission: RE | Admit: 2012-03-01 | Discharge: 2012-03-02 | Disposition: A | Discharge status: Home / Self Care | Payer: BC Managed Care – PPO | Source: Ambulatory Visit | Attending: General Surgery | Admitting: General Surgery

## 2012-03-01 ENCOUNTER — Encounter (HOSPITAL_COMMUNITY): Payer: Self-pay | Admitting: *Deleted

## 2012-03-01 ENCOUNTER — Ambulatory Visit (HOSPITAL_COMMUNITY): Payer: BC Managed Care – PPO | Admitting: Anesthesiology

## 2012-03-01 ENCOUNTER — Encounter (HOSPITAL_COMMUNITY): Payer: Self-pay | Admitting: Anesthesiology

## 2012-03-01 ENCOUNTER — Encounter (HOSPITAL_COMMUNITY)
Admission: RE | Disposition: A | Discharge status: Home / Self Care | Payer: Self-pay | Source: Ambulatory Visit | Attending: General Surgery

## 2012-03-01 DIAGNOSIS — Z01812 Encounter for preprocedural laboratory examination: Secondary | ICD-10-CM | POA: Insufficient documentation

## 2012-03-01 DIAGNOSIS — I10 Essential (primary) hypertension: Secondary | ICD-10-CM | POA: Insufficient documentation

## 2012-03-01 DIAGNOSIS — G4733 Obstructive sleep apnea (adult) (pediatric): Secondary | ICD-10-CM | POA: Insufficient documentation

## 2012-03-01 DIAGNOSIS — I2510 Atherosclerotic heart disease of native coronary artery without angina pectoris: Secondary | ICD-10-CM | POA: Insufficient documentation

## 2012-03-01 DIAGNOSIS — E785 Hyperlipidemia, unspecified: Secondary | ICD-10-CM | POA: Insufficient documentation

## 2012-03-01 DIAGNOSIS — E039 Hypothyroidism, unspecified: Secondary | ICD-10-CM | POA: Insufficient documentation

## 2012-03-01 DIAGNOSIS — K420 Umbilical hernia with obstruction, without gangrene: Principal | ICD-10-CM | POA: Insufficient documentation

## 2012-03-01 DIAGNOSIS — K219 Gastro-esophageal reflux disease without esophagitis: Secondary | ICD-10-CM | POA: Insufficient documentation

## 2012-03-01 DIAGNOSIS — Z79899 Other long term (current) drug therapy: Secondary | ICD-10-CM | POA: Insufficient documentation

## 2012-03-01 DIAGNOSIS — R109 Unspecified abdominal pain: Secondary | ICD-10-CM

## 2012-03-01 HISTORY — PX: VENTRAL HERNIA REPAIR: SHX424

## 2012-03-01 LAB — GLUCOSE, CAPILLARY: Glucose-Capillary: 129 mg/dL — ABNORMAL HIGH (ref 70–99)

## 2012-03-01 SURGERY — REPAIR, HERNIA, VENTRAL, LAPAROSCOPIC
Anesthesia: General | Site: Abdomen | Wound class: Clean Contaminated

## 2012-03-01 MED ORDER — MIDAZOLAM HCL 5 MG/5ML IJ SOLN
INTRAMUSCULAR | Status: DC | PRN
  Administered 2012-03-01: 2 mg via INTRAVENOUS

## 2012-03-01 MED ORDER — VITAMIN D3 25 MCG (1000 UNIT) PO TABS
1000.0000 [IU] | ORAL_TABLET | Freq: Every day | ORAL | Status: DC
  Administered 2012-03-01 – 2012-03-02 (×2): 1000 [IU] via ORAL
  Filled 2012-03-01 (×2): qty 1

## 2012-03-01 MED ORDER — BUPIVACAINE-EPINEPHRINE 0.5% -1:200000 IJ SOLN
INTRAMUSCULAR | Status: AC
  Filled 2012-03-01: qty 1

## 2012-03-01 MED ORDER — ONDANSETRON HCL 4 MG/2ML IJ SOLN
INTRAMUSCULAR | Status: DC | PRN
  Administered 2012-03-01: 4 mg via INTRAVENOUS

## 2012-03-01 MED ORDER — HYDROMORPHONE HCL PF 1 MG/ML IJ SOLN
INTRAMUSCULAR | Status: AC
  Filled 2012-03-01: qty 1

## 2012-03-01 MED ORDER — SODIUM CHLORIDE 0.9 % IJ SOLN: 3.0000 mL | Freq: Two times a day (BID) | INTRAMUSCULAR | Status: DC

## 2012-03-01 MED ORDER — ONDANSETRON HCL 4 MG/2ML IJ SOLN: 4.0000 mg | Freq: Four times a day (QID) | INTRAMUSCULAR | Status: DC | PRN

## 2012-03-01 MED ORDER — DEXTROSE 5 % IV SOLN
3.0000 g | INTRAVENOUS | Status: AC
  Administered 2012-03-01: 3 g via INTRAVENOUS

## 2012-03-01 MED ORDER — RAMIPRIL 10 MG PO CAPS
10.0000 mg | ORAL_CAPSULE | Freq: Every day | ORAL | Status: DC
  Administered 2012-03-01: 10 mg via ORAL
  Filled 2012-03-01 (×2): qty 1

## 2012-03-01 MED ORDER — ACETAMINOPHEN 10 MG/ML IV SOLN
INTRAVENOUS | Status: AC
  Filled 2012-03-01: qty 100

## 2012-03-01 MED ORDER — BUPIVACAINE-EPINEPHRINE 0.25% -1:200000 IJ SOLN
INTRAMUSCULAR | Status: AC
  Filled 2012-03-01: qty 1

## 2012-03-01 MED ORDER — LIDOCAINE HCL (CARDIAC) 20 MG/ML IV SOLN
INTRAVENOUS | Status: DC | PRN
  Administered 2012-03-01: 75 mg via INTRAVENOUS

## 2012-03-01 MED ORDER — ALBUTEROL SULFATE HFA 108 (90 BASE) MCG/ACT IN AERS
2.0000 | INHALATION_SPRAY | Freq: Four times a day (QID) | RESPIRATORY_TRACT | Status: DC | PRN
  Filled 2012-03-01: qty 6.7

## 2012-03-01 MED ORDER — HYDROCODONE-ACETAMINOPHEN 5-325 MG PO TABS
1.0000 | ORAL_TABLET | Freq: Four times a day (QID) | ORAL | Status: DC | PRN
  Administered 2012-03-01 (×2): 2 via ORAL
  Filled 2012-03-01 (×2): qty 2

## 2012-03-01 MED ORDER — PROMETHAZINE HCL 25 MG/ML IJ SOLN: 6.2500 mg | INTRAMUSCULAR | Status: DC | PRN

## 2012-03-01 MED ORDER — ACETAMINOPHEN 325 MG PO TABS: 650.0000 mg | ORAL_TABLET | ORAL | Status: DC | PRN

## 2012-03-01 MED ORDER — ROPINIROLE HCL 1 MG PO TABS
1.0000 mg | ORAL_TABLET | Freq: Every day | ORAL | Status: DC
  Administered 2012-03-01 – 2012-03-02 (×2): 1 mg via ORAL
  Filled 2012-03-01 (×2): qty 1

## 2012-03-01 MED ORDER — ACETAMINOPHEN 10 MG/ML IV SOLN
INTRAVENOUS | Status: DC | PRN
  Administered 2012-03-01: 1000 mg via INTRAVENOUS

## 2012-03-01 MED ORDER — PROPOFOL 10 MG/ML IV BOLUS
INTRAVENOUS | Status: DC | PRN
  Administered 2012-03-01: 150 mg via INTRAVENOUS

## 2012-03-01 MED ORDER — BUPIVACAINE-EPINEPHRINE 0.25% -1:200000 IJ SOLN
INTRAMUSCULAR | Status: DC | PRN
  Administered 2012-03-01: 50 mL

## 2012-03-01 MED ORDER — SODIUM CHLORIDE 0.9 % IV SOLN: 250.0000 mL | INTRAVENOUS | Status: DC | PRN

## 2012-03-01 MED ORDER — LAMOTRIGINE 150 MG PO TABS
150.0000 mg | ORAL_TABLET | Freq: Every day | ORAL | Status: DC
  Administered 2012-03-01: 150 mg via ORAL
  Filled 2012-03-01 (×2): qty 1

## 2012-03-01 MED ORDER — POTASSIUM CHLORIDE ER 10 MEQ PO TBCR
20.0000 meq | EXTENDED_RELEASE_TABLET | Freq: Every day | ORAL | Status: DC
  Administered 2012-03-01 – 2012-03-02 (×2): 20 meq via ORAL
  Filled 2012-03-01 (×3): qty 2

## 2012-03-01 MED ORDER — NEOSTIGMINE METHYLSULFATE 1 MG/ML IJ SOLN
INTRAMUSCULAR | Status: DC | PRN
  Administered 2012-03-01: 4 mg via INTRAVENOUS

## 2012-03-01 MED ORDER — PANTOPRAZOLE SODIUM 40 MG PO TBEC
40.0000 mg | DELAYED_RELEASE_TABLET | Freq: Every day | ORAL | Status: DC
  Administered 2012-03-01 – 2012-03-02 (×2): 40 mg via ORAL
  Filled 2012-03-01 (×2): qty 1

## 2012-03-01 MED ORDER — HYDROCODONE-ACETAMINOPHEN 5-500 MG PO TABS: 1.0000 | ORAL_TABLET | Freq: Four times a day (QID) | ORAL | Status: DC | PRN

## 2012-03-01 MED ORDER — GLYCOPYRROLATE 0.2 MG/ML IJ SOLN
INTRAMUSCULAR | Status: DC | PRN
  Administered 2012-03-01: 0.6 mg via INTRAVENOUS

## 2012-03-01 MED ORDER — HYDROMORPHONE HCL PF 1 MG/ML IJ SOLN
1.0000 mg | INTRAMUSCULAR | Status: DC | PRN
  Administered 2012-03-01: 1 mg via INTRAVENOUS
  Filled 2012-03-01: qty 1

## 2012-03-01 MED ORDER — CEFAZOLIN SODIUM 1-5 GM-% IV SOLN
INTRAVENOUS | Status: AC
  Filled 2012-03-01: qty 50

## 2012-03-01 MED ORDER — ROCURONIUM BROMIDE 100 MG/10ML IV SOLN
INTRAVENOUS | Status: DC | PRN
  Administered 2012-03-01: 38 mg via INTRAVENOUS
  Administered 2012-03-01 (×3): 10 mg via INTRAVENOUS
  Administered 2012-03-01: 2 mg via INTRAVENOUS

## 2012-03-01 MED ORDER — SODIUM CHLORIDE 0.9 % IJ SOLN: 3.0000 mL | INTRAMUSCULAR | Status: DC | PRN

## 2012-03-01 MED ORDER — OXYCODONE HCL 5 MG PO TABS
5.0000 mg | ORAL_TABLET | ORAL | Status: DC | PRN
  Administered 2012-03-02 (×2): 10 mg via ORAL
  Filled 2012-03-01 (×3): qty 2

## 2012-03-01 MED ORDER — LACTATED RINGERS IV SOLN
INTRAVENOUS | Status: DC | PRN
  Administered 2012-03-01: 07:00:00 via INTRAVENOUS

## 2012-03-01 MED ORDER — CEFAZOLIN SODIUM-DEXTROSE 2-3 GM-% IV SOLR
INTRAVENOUS | Status: AC
  Filled 2012-03-01: qty 50

## 2012-03-01 MED ORDER — HYDROMORPHONE HCL PF 1 MG/ML IJ SOLN
0.2500 mg | INTRAMUSCULAR | Status: DC | PRN
  Administered 2012-03-01 (×3): 0.5 mg via INTRAVENOUS

## 2012-03-01 MED ORDER — CHLORHEXIDINE GLUCONATE 4 % EX LIQD
1.0000 "application " | Freq: Once | CUTANEOUS | Status: DC
  Filled 2012-03-01: qty 15

## 2012-03-01 MED ORDER — LACTATED RINGERS IV SOLN: INTRAVENOUS | Status: DC

## 2012-03-01 MED ORDER — INSULIN ASPART 100 UNIT/ML ~~LOC~~ SOLN
0.0000 [IU] | Freq: Three times a day (TID) | SUBCUTANEOUS | Status: DC
  Administered 2012-03-02: 2 [IU] via SUBCUTANEOUS

## 2012-03-01 MED ORDER — LEVOTHYROXINE SODIUM 175 MCG PO TABS
175.0000 ug | ORAL_TABLET | Freq: Every day | ORAL | Status: DC
  Administered 2012-03-02: 175 ug via ORAL
  Filled 2012-03-01 (×2): qty 1

## 2012-03-01 MED ORDER — ALPRAZOLAM 0.25 MG PO TABS
0.2500 mg | ORAL_TABLET | Freq: Two times a day (BID) | ORAL | Status: DC
  Administered 2012-03-01 – 2012-03-02 (×2): 0.25 mg via ORAL
  Filled 2012-03-01 (×2): qty 1

## 2012-03-01 MED ORDER — SODIUM CHLORIDE 0.9 % IV SOLN
INTRAVENOUS | Status: DC
  Administered 2012-03-01: 14:00:00 via INTRAVENOUS

## 2012-03-01 MED ORDER — ACETAMINOPHEN 650 MG RE SUPP: 650.0000 mg | RECTAL | Status: DC | PRN

## 2012-03-01 MED ORDER — SUCCINYLCHOLINE CHLORIDE 20 MG/ML IJ SOLN
INTRAMUSCULAR | Status: DC | PRN
  Administered 2012-03-01: 100 mg via INTRAVENOUS

## 2012-03-01 MED ORDER — FENTANYL CITRATE 0.05 MG/ML IJ SOLN
INTRAMUSCULAR | Status: DC | PRN
  Administered 2012-03-01 (×7): 50 ug via INTRAVENOUS

## 2012-03-01 MED ORDER — FUROSEMIDE 40 MG PO TABS
40.0000 mg | ORAL_TABLET | Freq: Every morning | ORAL | Status: DC
  Administered 2012-03-01 – 2012-03-02 (×2): 40 mg via ORAL
  Filled 2012-03-01 (×2): qty 1

## 2012-03-01 MED ORDER — HYDROCODONE-ACETAMINOPHEN 5-325 MG PO TABS
1.0000 | ORAL_TABLET | ORAL | Status: DC | PRN
  Administered 2012-03-02 (×2): 2 via ORAL
  Filled 2012-03-01 (×2): qty 2

## 2012-03-01 MED ORDER — CYANOCOBALAMIN 1000 MCG/ML IJ SOLN: 1000.0000 ug | INTRAMUSCULAR | Status: DC

## 2012-03-01 SURGICAL SUPPLY — 59 items
ABSORBATACK20 ×2 IMPLANT
APPLIER CLIP 5 13 M/L LIGAMAX5 (MISCELLANEOUS)
BENZOIN TINCTURE PRP APPL 2/3 (GAUZE/BANDAGES/DRESSINGS) ×2 IMPLANT
BINDER ABD UNIV 12 45-62 (WOUND CARE) ×1 IMPLANT
BINDER ABDOMINAL 46IN 62IN (WOUND CARE) ×2
BLADE HEX COATED 2.75 (ELECTRODE) ×2 IMPLANT
CANISTER SUCTION 2500CC (MISCELLANEOUS) ×2 IMPLANT
CLIP APPLIE 5 13 M/L LIGAMAX5 (MISCELLANEOUS) IMPLANT
CLOTH BEACON ORANGE TIMEOUT ST (SAFETY) ×2 IMPLANT
DECANTER SPIKE VIAL GLASS SM (MISCELLANEOUS) ×2 IMPLANT
DERMABOND ADVANCED (GAUZE/BANDAGES/DRESSINGS)
DERMABOND ADVANCED .7 DNX12 (GAUZE/BANDAGES/DRESSINGS) IMPLANT
DEVICE TROCAR PUNCTURE CLOSURE (ENDOMECHANICALS) ×2 IMPLANT
DRAIN CHANNEL RND F F (WOUND CARE) IMPLANT
DRAPE INCISE IOBAN 66X45 STRL (DRAPES) ×2 IMPLANT
DRAPE LAPAROSCOPIC ABDOMINAL (DRAPES) ×2 IMPLANT
ELECT CAUTERY BLADE 6.4 (BLADE) ×2 IMPLANT
ELECT REM PT RETURN 9FT ADLT (ELECTROSURGICAL) ×2
ELECTRODE REM PT RTRN 9FT ADLT (ELECTROSURGICAL) ×1 IMPLANT
EVACUATOR SILICONE 100CC (DRAIN) IMPLANT
GLOVE BIO SURGEON STRL SZ7.5 (GLOVE) ×4 IMPLANT
GLOVE BIOGEL PI IND STRL 7.0 (GLOVE) ×1 IMPLANT
GLOVE BIOGEL PI INDICATOR 7.0 (GLOVE) ×1
GOWN PREVENTION PLUS XLARGE (GOWN DISPOSABLE) IMPLANT
GOWN STRL NON-REIN LRG LVL3 (GOWN DISPOSABLE) ×2 IMPLANT
GOWN STRL REIN XL XLG (GOWN DISPOSABLE) ×6 IMPLANT
HAND ACTIVATED (MISCELLANEOUS) IMPLANT
KIT BASIN OR (CUSTOM PROCEDURE TRAY) ×2 IMPLANT
MESH PARIETEX 4.7 (Mesh General) ×2 IMPLANT
NEEDLE MA TROC 1/2 (NEEDLE) ×2 IMPLANT
NEEDLE SPNL 22GX3.5 QUINCKE BK (NEEDLE) ×2 IMPLANT
NS IRRIG 1000ML POUR BTL (IV SOLUTION) ×2 IMPLANT
PEN SKIN MARKING BROAD (MISCELLANEOUS) IMPLANT
PENCIL BUTTON HOLSTER BLD 10FT (ELECTRODE) ×2 IMPLANT
SCISSORS LAP 5X35 DISP (ENDOMECHANICALS) ×2 IMPLANT
SET IRRIG TUBING LAPAROSCOPIC (IRRIGATION / IRRIGATOR) IMPLANT
SLEEVE SURGEON STRL (DRAPES) ×2 IMPLANT
SLEEVE XCEL OPT CAN 5 100 (ENDOMECHANICALS) ×6 IMPLANT
SOLUTION ANTI FOG 6CC (MISCELLANEOUS) ×2 IMPLANT
SPONGE LAP 18X18 X RAY DECT (DISPOSABLE) ×2 IMPLANT
STAPLER VISISTAT 35W (STAPLE) ×2 IMPLANT
STRIP CLOSURE SKIN 1/2X4 (GAUZE/BANDAGES/DRESSINGS) ×2 IMPLANT
SUT CHROMIC 2 0 SH (SUTURE) ×2 IMPLANT
SUT MNCRL AB 4-0 PS2 18 (SUTURE) ×2 IMPLANT
SUT NOVA NAB DX-16 0-1 5-0 T12 (SUTURE) IMPLANT
SUT PDS AB 0 CT1 36 (SUTURE) ×2 IMPLANT
SUT TICRON (SUTURE) ×2 IMPLANT
TACKER 5MM HERNIA 3.5CML NAB (ENDOMECHANICALS) IMPLANT
TACKER HERNIA 5MM 55CML ABS (Staple) ×2 IMPLANT
TOWEL OR 17X26 10 PK STRL BLUE (TOWEL DISPOSABLE) ×2 IMPLANT
TOWEL OR NON WOVEN STRL DISP B (DISPOSABLE) ×2 IMPLANT
TRAY FOLEY CATH 14FRSI W/METER (CATHETERS) ×2 IMPLANT
TRAY LAP CHOLE (CUSTOM PROCEDURE TRAY) ×2 IMPLANT
TROCAR BLADELESS OPT 5 75 (ENDOMECHANICALS) ×2 IMPLANT
TROCAR XCEL BLUNT TIP 100MML (ENDOMECHANICALS) IMPLANT
TROCAR XCEL NON-BLD 11X100MML (ENDOMECHANICALS) IMPLANT
TROCAR XCEL NON-BLD 5MMX100MML (ENDOMECHANICALS) ×2 IMPLANT
TUBING FILTER THERMOFLATOR (ELECTROSURGICAL) IMPLANT
WATER STERILE IRR 1500ML POUR (IV SOLUTION) IMPLANT

## 2012-03-01 NOTE — Op Note (Signed)
Pre Operative Diagnosis:  Incarcerated umbilical hernia  Post Operative Diagnosis: same  Procedure: laparoscopic-assisted Umbilical hernia repair with mesh placement  Surgeon: Dr. Axel Filler  Assistant: Dr. Andrey Campanile  Anesthesia: GETA  EBL: 25 cc  Complications: none  Counts: reported as correct x 2  Findings:  Large amount of incarcerated omentum within the umbilical hernia sac  Indications for procedure:  The patient was a 61 year old female with a several month history of having a hernia. Over the last several weeks at the area become more firm and had incarcerated fat this patient was seen in clinic to have this electively repaired.  Details of the procedure: after the patient was consented patient was taken back to the operating room patient was then placed in supine position bilateral SCDs in place. After antibiotics were confirmed a timeout was called and all facts were verified.  The peritoneal technique was used to the abdomen Palmer's point. The abdomen was insufflated to 14 mm mercury. Subsequently tedious a 5 mm trocar was placed a camera inserted there was no injury to any intra-abdominal organs.  It was seen that there was alarge amount of omentum that was incarcerated umbilical hernia. A second camera port was in placed into the left lower quadrant.  Proceed to dissect away the omentum from the hernia sac reduced the hernia however it was greatly incarcerated. This time the tourniquet was taken down a possible malignant with Bovie cautery maintaining hemostasis. And placed in the epigastrium. Proceeded to reduce the hernia contents requisition and hemostasis was achieved throughout with Bovie cautery secondary to the amount of omentum that was in the hernia contents a point where the port was then placed in the right subcostal margin the proceeded to continue to remove the omentum from the hernia sac. It was thought at this time that secondary to the adhesions of the omentum  and hernia sac it would not give to be reduced laparoscopically. We then used an 11 blade to make a similar incision in the infraumbilical fold. We dissected down to the hernia sac. The hernia sac was then freed off from the surrounding subcutaneous fat. The large amount of incarcerated omentum was extracted through the site. A 12 centimeter plastics PCO mesh was then placed intra-abdominally. After a 2-0 chromic was used to be placed in the middle of the mesh. The hernia defect was then reapproximated using 1-0 Ethibond stitches in interrupted fashion x4. At this time we continued laparoscopically insufflate the abdomen position the mesh over the umbilical defect. The mesh was secured circumferentially with am Absorbatack tacker.  The falciform ligament was then brought up to the abdominal wall and secured with the Absorbatack.  The omentum was brought over the area of the mesh. The pericardium was evacuated all port sites were removed. The skin was reapproximated opportunities for Monocryl subcuticular fashion. The skin was dressed with Steri-Strips tape and gauze. An umbilical pressure dressing was used the umbilical hernia site.  The patient was taken to the recovery room in stable condition.

## 2012-03-01 NOTE — Anesthesia Preprocedure Evaluation (Signed)
Anesthesia Evaluation  Patient identified by MRN, date of birth, ID band Patient awake    Reviewed: Allergy & Precautions, H&P , NPO status , Patient's Chart, lab work & pertinent test results  Airway Mallampati: II TM Distance: >3 FB Neck ROM: Full    Dental No notable dental hx.    Pulmonary shortness of breath, sleep apnea , pneumonia -, former smoker,  breath sounds clear to auscultation  Pulmonary exam normal       Cardiovascular hypertension, Pt. on medications + angina + CAD and +CHF Rhythm:Regular Rate:Normal     Neuro/Psych PSYCHIATRIC DISORDERS Anxiety Depression negative neurological ROS     GI/Hepatic Neg liver ROS, GERD-  Medicated,  Endo/Other  diabetes, Type 2, Oral Hypoglycemic AgentsHypothyroidism Morbid obesity  Renal/GU negative Renal ROS  negative genitourinary   Musculoskeletal negative musculoskeletal ROS (+)   Abdominal   Peds negative pediatric ROS (+)  Hematology negative hematology ROS (+)   Anesthesia Other Findings   Reproductive/Obstetrics negative OB ROS                           Anesthesia Physical Anesthesia Plan  ASA: III  Anesthesia Plan: General   Post-op Pain Management:    Induction: Intravenous  Airway Management Planned: Oral ETT  Additional Equipment:   Intra-op Plan:   Post-operative Plan: Extubation in OR  Informed Consent: I have reviewed the patients History and Physical, chart, labs and discussed the procedure including the risks, benefits and alternatives for the proposed anesthesia with the patient or authorized representative who has indicated his/her understanding and acceptance.   Dental advisory given  Plan Discussed with: CRNA  Anesthesia Plan Comments:         Anesthesia Quick Evaluation

## 2012-03-01 NOTE — Anesthesia Postprocedure Evaluation (Signed)
  Anesthesia Post-op Note  Patient: [name]  Procedure(s) Performed: Procedure(s) (LRB): LAPAROSCOPIC VENTRAL HERNIA (N/A) INSERTION OF MESH (N/A)  Patient Location: PACU  Anesthesia Type: General  Level of Consciousness: awake and alert   Airway and Oxygen Therapy: Patient Spontanous Breathing  Post-op Pain: mild  Post-op Assessment: Post-op Vital signs reviewed, Patient's Cardiovascular Status Stable, Respiratory Function Stable, Patent Airway and No signs of Nausea or vomiting  Post-op Vital Signs: stable  Complications: No apparent anesthesia complications

## 2012-03-01 NOTE — H&P (View-Only) (Signed)
Patient ID: [name], female   DOB: 02-21-1951, 61 y.o.   MRN: [id number]  Chief Complaint  Patient presents with  . Umbilical Hernia    HPI [name] is a 61 y.o. female is referred by Dr. Sharen Hones for incarcerated umbilical hernia. He states that patient has had a known umbilical hernia for 10-12 years and has not had any issues in the last several days. He states that approximately 5 days ago patient had swelling and induration of her umbilical hernia and was unable to reduce the contents. Patient since that time has had abdominal pain radiating to the left abdominal area with incarceration of her umbilical hernia. The patient underwent CT scan revealed omentum within the hernia sac without any bowel contents. HPI  Past Medical History  Diagnosis Date  . Vitamin B 12 deficiency   . Anxiety   . Depression   . Hyperlipidemia   . Hypothyroid   . CAD (coronary artery disease)     nonobstructive plaque  . Obesity     morbid, seen dietician 09/2011  . T2DM (type 2 diabetes mellitus)   . OSA (obstructive sleep apnea)     medium quattro full face mask CPAP 10cm  . Vitamin D deficiency     last check 47 (11/2010)  . Fibula fracture   . Fatty liver 2006    [place]  . 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 Diastolic CHF 08/2011    EF 55-60%, nl valves  . RLS (restless legs syndrome)   . Arthritis   . GERD (gastroesophageal reflux disease)   . Hypertension     Past Surgical History  Procedure Date  . Osteotomy and ulnar shortening 2001    [name]  . Knee surgery 11-02-2008    Partial medical and Lat Menisectomies and Condroplasty (Dr. Thurston Hole)  . Cardiac catheterization 10/12/09    EF 70%  . Mr abdomen 01/16/2011    Left 4cm adrenal myelolipoma  . Cardiac catheterization 08/23/2011    Mild nonobstructive CAD, normal LV fxn  . US echocardiography 08/2011    EF of 55-60% and indeterminate diastolic function  . Esophagogastroduodenoscopy 2006    Family  History  Problem Relation Age of Onset  . Coronary artery disease Mother 37    14 angioplasty and 5v CABG  . Stroke Mother   . Diabetes Mother   . Heart attack Father 30  . Heart attack Brother 84    deceased age 59    Social History History  Substance Use Topics  . Smoking status: Former Smoker    Quit date: 08/08/1997  . Smokeless tobacco: Never Used  . Alcohol Use: No    Allergies  Allergen Reactions  . Erythromycin Base     REACTION: Vomitting  . Neomycin     vomiting     Current Outpatient Prescriptions  Medication Sig Dispense Refill  . albuterol (PROVENTIL HFA;VENTOLIN HFA) 108 (90 BASE) MCG/ACT inhaler Inhale 2 puffs into the lungs every 6 (six) hours as needed for wheezing.  1 Inhaler  3  . ALPRAZolam (XANAX) 0.25 MG tablet TAKE 1 TABLET BY MOUTH TWICE A DAY  60 tablet  0  . aspirin 81 MG EC tablet Take 1 tablet (81 mg total) by mouth daily.  90 tablet  3  . atorvastatin (LIPITOR) 40 MG tablet Take 1 tablet (40 mg total) by mouth at bedtime.  90 tablet  3  . cetirizine (ZYRTEC) 10 MG tablet  Take 10 mg by mouth daily.      . cholecalciferol (VITAMIN D) 1000 UNITS tablet Take 1,000 Units by mouth daily.        . cyanocobalamin (,VITAMIN B-12,) 1000 MCG/ML injection Inject 1 mL (1,000 mcg total) into the muscle every 30 (thirty) days.  10 mL  3  . furosemide (LASIX) 40 MG tablet Take 1 tablet (40 mg total) by mouth daily.  90 tablet  3  . glyBURIDE-metformin (GLUCOVANCE) 1.25-250 MG per tablet Take 1 tablet by mouth daily with breakfast.  90 tablet  3  . lamoTRIgine (LAMICTAL) 150 MG tablet Take 150 mg by mouth daily.      Marland Kitchen levothyroxine (SYNTHROID, LEVOTHROID) 175 MCG tablet Take 1 tablet (175 mcg total) by mouth daily.  90 tablet  3  . NEEDLE, DISP, 25 G (MONOJECT MAGELLAN SAFETY NDL) 25G X 1" MISC by Does not apply route as directed.        . nystatin ointment (MYCOSTATIN) Apply 1 application topically as needed.      Marland Kitchen omeprazole (PRILOSEC) 40 MG capsule Take 1  capsule (40 mg total) by mouth daily.  90 capsule  3  . potassium chloride (KLOR-CON 10) 10 MEQ tablet Take 2 tablets (20 mEq total) by mouth daily.  180 tablet  3  . ramipril (ALTACE) 10 MG capsule Take 1 capsule (10 mg total) by mouth daily.  90 capsule  3  . rOPINIRole (REQUIP) 1 MG tablet Take 1 tablet (1 mg total) by mouth at bedtime.  90 tablet  3  . tiZANidine (ZANAFLEX) 4 MG capsule as needed.      . Vilazodone HCl (VIIBRYD) 10 MG TABS Take 40 mg by mouth daily.       . Vilazodone HCl 20 MG TABS Take 20 mg by mouth daily. Takes total 30 mg (also takes 10 mg tab).       No current facility-administered medications for this visit.   Facility-Administered Medications Ordered in Other Visits  Medication Dose Route Frequency Provider Last Rate Last Dose  . iohexol (OMNIPAQUE) 300 MG/ML solution 100 mL  100 mL Intravenous Once PRN Medication Radiologist, MD   100 mL at 02/19/12 1503    Review of Systems Review of Systems  Constitutional: Negative.   HENT: Negative.   Eyes: Negative.   Cardiovascular: Negative.   Gastrointestinal: Positive for abdominal pain.  Neurological: Negative.     Blood pressure 120/78, pulse 78, temperature 98.9 F (37.2 C), temperature source Oral, resp. rate 18, height 5' 6.5" (1.689 m), weight 303 lb (137.44 kg).  Physical Exam Physical Exam  Constitutional: She is oriented to person, place, and time. She appears well-developed.  HENT:  Head: Normocephalic and atraumatic.  Eyes: Conjunctivae normal are normal.  Neck: Normal range of motion. Neck supple.  Cardiovascular: Normal rate and regular rhythm.   Pulmonary/Chest: Effort normal.  Abdominal: Soft. She exhibits no mass. There is tenderness (umbilicus). There is no rebound. A hernia is present. Hernia confirmed positive in the ventral area.         Incarcerated umbilical hernia  Neurological: She is alert and oriented to person, place, and time.    Data Reviewed CT scan reviewed which  revealed A. Approximately 2.3 cm hernia with omentum and without bowel contents and  Assessment    The patient is a 61 year old female with an incarcerated umbilical hernia. Patient has had diagnosis of congestive heart failure and see Dr. Daleen Squibb. We will seek cardiac evaluation per Dr. Daleen Squibb  and expedite scheduling of the laparoscopic umbilical hernia repair and mesh.    Plan    1. Cardiac evaluation per Dr. Daleen Squibb.   2. Wife for scheduling of a laparoscopic umbilical hernia repair possible open  3. All risks and benefits were discussed with the patient, to generally include infection, bleeding, damage to surrounding structures, and recurrence. Alternatives were offered and described.  All questions were answered and the patient voiced understanding of the procedure and wishes to proceed at this point.        Marigene Ehlers., Braylan Faul 02/19/2012, 3:54 PM

## 2012-03-01 NOTE — Interval H&P Note (Signed)
History and Physical Interval Note:  03/01/2012 7:13 AM  [name]  has presented today for surgery, with the diagnosis of ventral hernia  The various methods of treatment have been discussed with the patient and family. After consideration of risks, benefits and other options for treatment, the patient has consented to  Procedure(s) (LRB) with comments: LAPAROSCOPIC VENTRAL HERNIA (N/A) INSERTION OF MESH (N/A) as a surgical intervention .  The patient's history has been reviewed, patient examined, no change in status, stable for surgery.  I have reviewed the patient's chart and labs.  Questions were answered to the patient's satisfaction.     Marigene Ehlers., Jed Limerick

## 2012-03-01 NOTE — Transfer of Care (Signed)
Immediate Anesthesia Transfer of Care Note  Patient: [name]  Procedure(s) Performed: Procedure(s) (LRB) with comments: LAPAROSCOPIC VENTRAL HERNIA (N/A) INSERTION OF MESH (N/A)  Patient Location: PACU  Anesthesia Type: General  Level of Consciousness: awake, alert  and patient cooperative  Airway & Oxygen Therapy: Patient Spontanous Breathing and Patient connected to face mask oxygen  Post-op Assessment: Report given to PACU RN and Post -op Vital signs reviewed and stable  Post vital signs: Reviewed and stable  Complications: No apparent anesthesia complications

## 2012-03-02 LAB — GLUCOSE, CAPILLARY
Glucose-Capillary: 129 mg/dL — ABNORMAL HIGH (ref 70–99)
Glucose-Capillary: 88 mg/dL (ref 70–99)

## 2012-03-02 MED ORDER — ACETAMINOPHEN 325 MG PO TABS: 650.0000 mg | ORAL_TABLET | ORAL | Status: DC | PRN

## 2012-03-02 NOTE — Progress Notes (Signed)
Discharged from floor via w/c, family with pt. No changes in assessment. Jonell Brumbaugh  

## 2012-03-02 NOTE — Progress Notes (Signed)
Patient ID: [name], female   DOB: [birth year], 61 y.o.   MRN: [id number]  General Surgery - [facility] - Progress Note  POD# 1  Subjective: Patient with moderate pain with movement.  Up to bedside commode.  Has not ambulated.  Tolerating solid food.  Voiding.  Objective: Vital signs in last 24 hours: Temp:  [97 F (36.1 C)-98.5 F (36.9 C)] 97 F (36.1 C) (10/19 0535) Pulse Rate:  [56-72] 66  (10/19 0535) Resp:  [12-20] 16  (10/19 0535) BP: (104-165)/(40-80) 104/66 mmHg (10/19 0535) SpO2:  [93 %-100 %] 98 % (10/19 0535) FiO2 (%):  [94 %] 94 % (10/18 1245) Weight:  [302 lb (136.986 kg)] 302 lb (136.986 kg) (10/18 1245) Last BM Date: 02/29/12  Intake/Output from previous day: 10/18 0701 - 10/19 0700 In: 2256.3 [P.O.:480; I.V.:1776.3] Out: 1745 [Urine:1745]  Exam: HEENT - clear, not icteric Neck - soft  Chest - clear bilaterally Cor - RRR, no murmur Abd - soft, obese; BS present; dressings intact and dry Ext - no significant edema Neuro - grossly intact, no focal deficits  Lab Results:   Basename 02/28/12 1020  WBC 5.1  HGB 13.9  HCT 43.2  PLT 243     Basename 02/28/12 1020  NA 138  K 3.4*  CL 98  CO2 28  GLUCOSE 99  BUN 20  CREATININE 0.82  CALCIUM 9.6    Studies/Results: No results found.  Assessment / Plan: 1.  Status post lap assisted umbilical hernia repair with mesh  - ambulate  - pain Rx  - likely home later today or in AM 10/20  Velora Heckler, MD, [facility] Office: [phone]  03/02/2012

## 2012-03-02 NOTE — Progress Notes (Signed)
Pt up in hall with assist. Reports legs 'wobbly' & some nausea with dizziness. [name], [employer]

## 2012-03-02 NOTE — Progress Notes (Signed)
Pt hoping to go home. Second time up in hall, she reports her legs still feel 'wobbly' & she is again dizzy & has some nausea. Dr Derrell Lolling notified, says pt should still be able to go home. Garlon Tuggle, [employer]

## 2012-03-02 NOTE — Discharge Summary (Signed)
Physician Discharge Summary  Patient ID: [name] MRN: [id number] DOB/AGE: [birth year] 61 y.o.  Admit date: 03/01/2012 Discharge date: 03/02/2012  Admission Diagnoses: status post ventral hernia repair  Discharge Diagnoses: same Active Problems:  * No active hospital problems. *    Discharged Condition: good  Hospital Course: the patient is a 61 year old female who underwent laparoscopic-assisted ventral hernia repair. Patient was admitted subsequent to pain control. Postoperatively patient was sent to the floor started clear liquid diet advanced to regular diet was patient was able to tolerate it well. Patient had good pain control prior to discharge. Patient was ambulating on her own. She had good bowel and bladder function. Patient was otherwise afebrile was deemed stable for discharge and discharged home to Consults: None  Significant Diagnostic Studies: none  Treatments: analgesia: Vicodin  Discharge Exam: Blood pressure 97/61, pulse 68, temperature 98.1 F (36.7 C), temperature source Oral, resp. rate 16, height 5\' 7"  (1.702 m), weight 302 lb (136.986 kg), SpO2 95.00%. General appearance: alert and cooperative Abdomen: wound clean, dry, and intact no hernia, soft, appropriately tender palpation, nondistended  Disposition: 01-Home or Self Care  Discharge Orders    Future Appointments: Provider: Department: Dept Phone: Center:   03/08/2012 4:00 PM Axel Filler, MD [facility] [phone] None   03/11/2012 8:00 AM [facility] [place] [phone] [id number]   07/01/2012 8:15 AM Eustaquio Boyden, MD [facility] [phone] [id number]       Medication List     As of 03/02/2012  7:25 PM    STOP taking these medications         aspirin EC 81 MG tablet      TAKE these medications         acetaminophen 325 MG tablet   Commonly known as: TYLENOL   Take 2 tablets (650 mg total) by mouth every 4 (four) hours as needed (or Fever >/=  101).      albuterol 108 (90 BASE) MCG/ACT inhaler   Commonly known as: PROVENTIL HFA;VENTOLIN HFA   Inhale 2 puffs into the lungs every 6 (six) hours as needed for wheezing.      ALPRAZolam 0.25 MG tablet   Commonly known as: XANAX   Take 0.25 mg by mouth 2 (two) times daily. For anxiety      atorvastatin 40 MG tablet   Commonly known as: LIPITOR   Take 1 tablet (40 mg total) by mouth at bedtime.      cetirizine 10 MG tablet   Commonly known as: ZYRTEC   Take 10 mg by mouth daily.      cholecalciferol 1000 UNITS tablet   Commonly known as: VITAMIN D   Take 1,000 Units by mouth daily.      cyanocobalamin 1000 MCG/ML injection   Commonly known as: (VITAMIN B-12)   Inject 1 mL (1,000 mcg total) into the muscle every 30 (thirty) days.      furosemide 40 MG tablet   Commonly known as: LASIX   Take 40 mg by mouth every morning.      glyBURIDE-metformin 1.25-250 MG per tablet   Commonly known as: GLUCOVANCE   Take 1 tablet by mouth daily with breakfast.      HYDROcodone-acetaminophen 5-500 MG per tablet   Commonly known as: VICODIN   Take 1 tablet by mouth every 6 (six) hours as needed for pain.      lamoTRIgine 150 MG tablet   Commonly known as: LAMICTAL   Take 150 mg by mouth at bedtime.  levothyroxine 175 MCG tablet   Commonly known as: SYNTHROID, LEVOTHROID   Take 175 mcg by mouth every morning.      MONOJECT MAGELLAN SAFETY NDL 25G X 1" Misc   Generic drug: NEEDLE (DISP) 25 G   by Does not apply route as directed.      nystatin ointment   Commonly known as: MYCOSTATIN   Apply 1 application topically daily as needed. Apply to red areas in groin      omeprazole 40 MG capsule   Commonly known as: PRILOSEC   Take 1 capsule (40 mg total) by mouth daily.      potassium chloride 10 MEQ tablet   Commonly known as: K-DUR   Take 2 tablets (20 mEq total) by mouth daily.      ramipril 10 MG capsule   Commonly known as: ALTACE   Take 1 capsule (10 mg total) by  mouth daily.      rOPINIRole 1 MG tablet   Commonly known as: REQUIP   Take 1 mg by mouth daily. Patient takes at 4pm      tiZANidine 4 MG capsule   Commonly known as: ZANAFLEX   Take 4 mg by mouth daily as needed. For muscle pain      VIIBRYD 40 MG Tabs   Generic drug: Vilazodone HCl   Take 40 mg by mouth every morning.           Follow-up Information    Follow up with Lajean Saver, MD. In 2 weeks.   Contact information:   [address] [place] [address] [phone]          Signed: Marigene Ehlers., Jed Limerick 03/02/2012, 7:25 PM

## 2012-03-04 ENCOUNTER — Encounter (HOSPITAL_COMMUNITY): Payer: Self-pay | Admitting: General Surgery

## 2012-03-04 ENCOUNTER — Telehealth: Payer: Self-pay | Admitting: Family Medicine

## 2012-03-04 NOTE — Telephone Encounter (Signed)
Spoke with patient. She said she is just in a lot of pain from her surgery and can't get in the office due to the pain and she is afraid of getting something (bacteria) in her surgical site by coming in the office. I advised to use mucinex with plenty of fluids and nasal saline/neti pot and her inhaler for now because more than likely things were viral at this point and even if she came in, she probably wouldn't need abx. I advised to keep a check on her temp and if she developed one or if she got worse to call me and I would schedule her an appt and have her come in through the back door to avoid the lobby. She verbalized understanding.

## 2012-03-04 NOTE — Telephone Encounter (Signed)
I do recommend eval, I'm sorry we do not prescribe abx as [facility] policy over phone.   We could see her this afternoon? So far sounds viral, but she does have h/o pneumonia last year.

## 2012-03-04 NOTE — Telephone Encounter (Signed)
She has h/o CHF exacerbation leading to hospitalization when she refused office eval in past, and at that time she thought she had a cold as well.  That is why she needs to come in for face to face evaluation.  She may need abx but I can't tell without evaluating her.   She can come in tomorrow.  Ok to bring her in via back door to avoid lobby.

## 2012-03-04 NOTE — Telephone Encounter (Signed)
Spoke with patient and added to schedule for tomorrow. She will call and cancel if feeling better.

## 2012-03-04 NOTE — Telephone Encounter (Signed)
Caller: Rylynn/Patient; Patient Name: [name]; PCP: Eustaquio Boyden [facility]); Best Callback Phone Number: [phone]. Onset 03/02/12 Patient states she has sinus congestion, mild wheezing,  low grade temp, patient reports she had hernia surgery 03/01/12 and does not want to come ionto the office afraid of getting something else. Patient ask that provider call in a ZPak.   All emergent symptoms ruled out per Upper Respiratory Infection protocol with exception "Mild to moderate headach for more than 24 hours unreleived with nonprescription medications."  Home care advice given.  PLEASE F/U WITH PATIENT CONCERNING APPT. NEEDS.  THANK YOU.

## 2012-03-05 ENCOUNTER — Ambulatory Visit: Payer: BC Managed Care – PPO | Admitting: Family Medicine

## 2012-03-06 ENCOUNTER — Telehealth: Payer: Self-pay | Admitting: *Deleted

## 2012-03-06 NOTE — Telephone Encounter (Signed)
Noted  

## 2012-03-06 NOTE — Telephone Encounter (Signed)
Spoke with patient and she said she cancelled because she felt about the same and was able to get a zpack to hold onto just incase from her GYN. She is just in a lot of pain and couldn't come in. She also said her BP was running low while in the hospital, so they told her to hold her altace. She said her dyastolic has been running low since d/c (she can't remember systolic), so she has just continued to hold med. She said she feels fine and her daughter (who is a Engineer, civil (consulting)) is checking her lungs and BP everyday. I scheduled her for a follow up with you on 03-11-12 to discuss things. She has post-op appt with surgeon on 03-08-12.

## 2012-03-08 ENCOUNTER — Encounter (INDEPENDENT_AMBULATORY_CARE_PROVIDER_SITE_OTHER): Payer: Self-pay | Admitting: General Surgery

## 2012-03-08 ENCOUNTER — Ambulatory Visit (INDEPENDENT_AMBULATORY_CARE_PROVIDER_SITE_OTHER): Payer: BC Managed Care – PPO | Admitting: General Surgery

## 2012-03-08 VITALS — BP 122/84 | HR 70 | Temp 98.6°F | Resp 14 | Ht 66.5 in | Wt 301.0 lb

## 2012-03-08 DIAGNOSIS — Z9889 Other specified postprocedural states: Secondary | ICD-10-CM

## 2012-03-08 NOTE — Progress Notes (Signed)
Patient ID: [name], female   DOB: 06/05/1950, 61 y.o.   MRN: [id number] This patient is a 61 year old female status post a left repair one week. The patient and her daughter are concerned about a the area of the hernia sac that has an area of most likely a seroma as well as some likely hematoma. Patient has had some pain in the area the hernia sac in the area of previous dissection. She's had no fevers no bleeding and no limitations of her daily activities at this time.  On exam:  Her wounds clean dry and intact the patient has a moderate-sized hematoma is resolving her left lower quadrant the area hernia sac. There are some palpable nodularities over the hernia sac however there is no palpable hernia with  Coughing.  Assessment and plan:  1. Patient is to continue with weight lifting restrictions no greater than 10-15 pounds for 4-6 weeks. Patient continue with ambulation as necessary. The patient is able to drive as long as she is not on pain medication and able to use for brakes if necessary.  2. The patient's followup x3 weeks for wound check. She presented for concerned patient call for an appointment.

## 2012-03-11 ENCOUNTER — Encounter: Payer: Self-pay | Admitting: Family Medicine

## 2012-03-11 ENCOUNTER — Ambulatory Visit (INDEPENDENT_AMBULATORY_CARE_PROVIDER_SITE_OTHER): Payer: BC Managed Care – PPO | Admitting: Family Medicine

## 2012-03-11 ENCOUNTER — Other Ambulatory Visit (INDEPENDENT_AMBULATORY_CARE_PROVIDER_SITE_OTHER): Payer: BC Managed Care – PPO

## 2012-03-11 VITALS — BP 132/78 | HR 66 | Temp 98.3°F | Wt 299.8 lb

## 2012-03-11 DIAGNOSIS — E039 Hypothyroidism, unspecified: Secondary | ICD-10-CM

## 2012-03-11 DIAGNOSIS — R0600 Dyspnea, unspecified: Secondary | ICD-10-CM

## 2012-03-11 DIAGNOSIS — G2581 Restless legs syndrome: Secondary | ICD-10-CM

## 2012-03-11 DIAGNOSIS — R0989 Other specified symptoms and signs involving the circulatory and respiratory systems: Secondary | ICD-10-CM

## 2012-03-11 DIAGNOSIS — R0609 Other forms of dyspnea: Secondary | ICD-10-CM

## 2012-03-11 LAB — TSH: TSH: 1.32 u[IU]/mL (ref 0.35–5.50)

## 2012-03-11 LAB — T4, FREE: Free T4: 0.86 ng/dL (ref 0.60–1.60)

## 2012-03-11 NOTE — Addendum Note (Signed)
Addended by: Alvina Chou on: 03/11/2012 10:23 AM   Modules accepted: Orders

## 2012-03-11 NOTE — Assessment & Plan Note (Signed)
Recheck med today, titrate accordingly.

## 2012-03-11 NOTE — Assessment & Plan Note (Signed)
pt motivated for continued weight loss.

## 2012-03-11 NOTE — Progress Notes (Signed)
Subjective:    Patient ID: [name], female    DOB: 04/13/1951, 61 y.o.   MRN: [id number]  HPI CC: 1 mo f/u  Last seen here early October with concern for incarcerated umbilical hernia, referred to surgery and had hernia repair with mesh later in the week.  Doing well from surgical standpoint, has f/u with Dr. Derrell Lolling in 3 wks.  Has had tough recovery after surgery.  Had to stay an extra day 2/2 pain.  In interim, had upper respiratory infection, we asked her to come in to be evaluated but she refused.  Ended up not taking any abx for this.  Discussed this.    Today feeling very well.  States blood pressures have been running low, but today normal.  Also here for thyroid recheck after recent overtreatment (presumed 2/2 weight loss).  RLS - takes requip 1mg  at 10pm.  But noticing needs prior dosing around 6pm, so has been taking 0.25 or 0.5mg  .  Wt Readings from Last 3 Encounters:  03/11/12 299 lb 12 oz (135.966 kg)  03/08/12 301 lb (136.533 kg)  03/01/12 302 lb (136.986 kg)   noted weight loss.  Congratulated.  Past Medical History  Diagnosis Date  . Vitamin B 12 deficiency   . Anxiety   . Depression   . Hyperlipidemia   . Hypothyroid   . CAD (coronary artery disease)     nonobstructive plaque  . Obesity     morbid, seen dietician 09/2011  . T2DM (type 2 diabetes mellitus)   . Vitamin D deficiency     last check 47 (11/2010)  . Fibula fracture   . Fatty liver 2006    [place]  . 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 Diastolic CHF 08/2011    EF 55-60%, nl valves  . RLS (restless legs syndrome)   . Arthritis   . GERD (gastroesophageal reflux disease)   . Hypertension   . Complication of anesthesia     hard time waking up 02 sats 85=-89 range   . OSA (obstructive sleep apnea)     medium quattro full face mask CPAP 10cm  . Pneumonia     hx of pneumonia     Current Outpatient Prescriptions on File Prior to Visit  Medication Sig Dispense Refill  .  albuterol (PROVENTIL HFA;VENTOLIN HFA) 108 (90 BASE) MCG/ACT inhaler Inhale 2 puffs into the lungs every 6 (six) hours as needed for wheezing.  1 Inhaler  3  . ALPRAZolam (XANAX) 0.25 MG tablet Take 0.25 mg by mouth 2 (two) times daily. For anxiety      . atorvastatin (LIPITOR) 40 MG tablet Take 1 tablet (40 mg total) by mouth at bedtime.  90 tablet  3  . cetirizine (ZYRTEC) 10 MG tablet Take 10 mg by mouth daily.      . cholecalciferol (VITAMIN D) 1000 UNITS tablet Take 1,000 Units by mouth daily.       . cyanocobalamin (,VITAMIN B-12,) 1000 MCG/ML injection Inject 1 mL (1,000 mcg total) into the muscle every 30 (thirty) days.  10 mL  3  . furosemide (LASIX) 40 MG tablet Take 40 mg by mouth every morning.      . glyBURIDE-metformin (GLUCOVANCE) 1.25-250 MG per tablet Take 1 tablet by mouth daily with breakfast.  90 tablet  3  . lamoTRIgine (LAMICTAL) 150 MG tablet Take 150 mg by mouth at bedtime.       Marland Kitchen levothyroxine (SYNTHROID, LEVOTHROID) 175  MCG tablet Take 175 mcg by mouth every morning.      Marland Kitchen NEEDLE, DISP, 25 G (MONOJECT MAGELLAN SAFETY NDL) 25G X 1" MISC by Does not apply route as directed.        . nystatin ointment (MYCOSTATIN) Apply 1 application topically daily as needed. Apply to red areas in groin      . omeprazole (PRILOSEC) 40 MG capsule Take 1 capsule (40 mg total) by mouth daily.  90 capsule  3  . potassium chloride (KLOR-CON 10) 10 MEQ tablet Take 2 tablets (20 mEq total) by mouth daily.  180 tablet  3  . ramipril (ALTACE) 10 MG capsule Take 1 capsule (10 mg total) by mouth daily.  90 capsule  3  . rOPINIRole (REQUIP) 1 MG tablet Take 1 mg by mouth daily. Patient takes at 4pm      . Vilazodone HCl (VIIBRYD) 40 MG TABS Take 40 mg by mouth every morning.      Marland Kitchen acetaminophen (TYLENOL) 325 MG tablet Take 2 tablets (650 mg total) by mouth every 4 (four) hours as needed (or Fever >/= 101).        Review of Systems Per HPI    Objective:   Physical Exam  Nursing note and vitals  reviewed. Constitutional: She appears well-developed and well-nourished. No distress.       obese  HENT:  Head: Normocephalic and atraumatic.  Cardiovascular: Normal rate, regular rhythm, normal heart sounds and intact distal pulses.   No murmur heard. Pulmonary/Chest: Effort normal and breath sounds normal. No respiratory distress. She has no wheezes. She has no rales.  Abdominal:       Ecchymoses present as well as incisions x5 c/d/i.  Musculoskeletal: She exhibits no edema.  Skin: Skin is warm and dry. No rash noted.  Psychiatric: She has a normal mood and affect.       Bright affect today       Assessment & Plan:

## 2012-03-11 NOTE — Assessment & Plan Note (Signed)
Try to take requip 1mg  sooner in day - ie 6pm when sxs start.  We have room to titrate up med.

## 2012-03-11 NOTE — Patient Instructions (Signed)
Let's check blood work today. Good to see you, call us with questions.

## 2012-03-11 NOTE — Assessment & Plan Note (Signed)
Very mild today.  Anticipate due to viral urti syndrome.  No evidence of CHF exac today. Discussed anticipated course of resolution, to notify us if sxs persist into end of this week. Using alb inh prn.

## 2012-03-22 ENCOUNTER — Encounter (INDEPENDENT_AMBULATORY_CARE_PROVIDER_SITE_OTHER): Payer: Self-pay | Admitting: General Surgery

## 2012-03-29 ENCOUNTER — Encounter (INDEPENDENT_AMBULATORY_CARE_PROVIDER_SITE_OTHER): Payer: BC Managed Care – PPO | Admitting: General Surgery

## 2012-05-09 ENCOUNTER — Encounter: Payer: Self-pay | Admitting: Family Medicine

## 2012-06-04 ENCOUNTER — Ambulatory Visit: Payer: BC Managed Care – PPO | Admitting: Family Medicine

## 2012-06-06 ENCOUNTER — Encounter: Payer: Self-pay | Admitting: Family Medicine

## 2012-06-06 ENCOUNTER — Other Ambulatory Visit: Payer: Self-pay | Admitting: Family Medicine

## 2012-06-06 ENCOUNTER — Ambulatory Visit (INDEPENDENT_AMBULATORY_CARE_PROVIDER_SITE_OTHER): Payer: BC Managed Care – PPO | Admitting: Family Medicine

## 2012-06-06 VITALS — BP 112/74 | HR 66 | Temp 98.3°F | Wt 292.5 lb

## 2012-06-06 DIAGNOSIS — E119 Type 2 diabetes mellitus without complications: Secondary | ICD-10-CM

## 2012-06-06 DIAGNOSIS — E039 Hypothyroidism, unspecified: Secondary | ICD-10-CM

## 2012-06-06 DIAGNOSIS — G4733 Obstructive sleep apnea (adult) (pediatric): Secondary | ICD-10-CM

## 2012-06-06 DIAGNOSIS — E785 Hyperlipidemia, unspecified: Secondary | ICD-10-CM

## 2012-06-06 LAB — MICROALBUMIN / CREATININE URINE RATIO: Microalb, Ur: 0.2 mg/dL (ref 0.0–1.9)

## 2012-06-06 LAB — HEMOGLOBIN A1C: Hgb A1c MFr Bld: 6 % (ref 4.6–6.5)

## 2012-06-06 MED ORDER — METFORMIN HCL 500 MG PO TABS: 250.0000 mg | ORAL_TABLET | Freq: Every day | ORAL | Status: DC

## 2012-06-06 NOTE — Assessment & Plan Note (Signed)
Encouraged continued exercise and healthy lifestyle changes.  Motivated.

## 2012-06-06 NOTE — Assessment & Plan Note (Signed)
Lab Results  Component Value Date   HGBA1C 6.2 11/28/2011  last visit glucovance decreased to qd dosing. Recheck A1c today.  If remaining stable, consider changing antihyperglycemic to metformin alone at 250mg  daily.

## 2012-06-06 NOTE — Patient Instructions (Addendum)
Good to see you today. Blood work today. Return as needed or as scheduled. We will contact you with blood work results and plan for diabetes medicine.

## 2012-06-06 NOTE — Assessment & Plan Note (Signed)
Has been chronic, stable in past.  Pt not fasting today.  Check FLP at next office visit.

## 2012-06-06 NOTE — Progress Notes (Signed)
Subjective:    Patient ID: [name], female    DOB: [birth year], 62 y.o.   MRN: [id number]  HPI CC: sleep problems  [name] presents today to discuss trouble with sleep.  In am, notices waking up with pressure in chest.  H/o OSA (obstructive sleep apnea) - nasal mask, CPAP 10cm.  Had been seeing a doctor (pulm) in [place] Q6 mo.  Last saw 11/2011, no changes at that time.  Saw Dr. 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  Would like to establish with Dr. Vickey Huger for OSA.  Wonders if chest tightness is OSA related from incorrect pressure settings due to recent weight loss.  Has questions about coming off medicine 2/2 weight loss. DM - doesn't regularly check at home anymore 2/2 good control endorsed.  No low sugars.  States due for vision screen around 07/2012.  Foot exam today.  Compliant with low dose glucovance.  Wt Readings from Last 3 Encounters:  06/06/12 292 lb 8 oz (132.677 kg)  03/11/12 299 lb 12 oz (135.966 kg)  03/08/12 301 lb (136.533 kg)  30lb weight loss noted over 7 months, since starting herbalife.  Feels better than she has in a long time on this program. Started walking in last month - walks about 1/2 mile twice a day.  No tightness or pressure or SOB with walking.  Medications and allergies reviewed and updated in chart.  Past histories reviewed and updated if relevant as below. Patient Active Problem List  Diagnosis  . HYPOTHYROIDISM  . DIABETES MELLITUS, TYPE II  . B12 DEFICIENCY  . Unspecified vitamin D deficiency  . HYPERLIPIDEMIA-MIXED  . OBESITY, MORBID  . DEPRESSION  . OBSTRUCTIVE SLEEP APNEA  . CAD, NATIVE VESSEL  . DERMATITIS  . INSOMNIA  . History of pneumonia  . Adrenal mass, left  . Atypical chest pain-non obstr cath 08/2011  . Intermediate coronary syndrome  . Coronary artery disease  . Dyspnea  . Chronic diastolic heart failure  . Healthcare maintenance  . RLS (restless legs syndrome)  . Abdominal  pain, other  specified site  . Incarcerated umbilical hernia   Past Medical History  Diagnosis Date  . Vitamin B 12 deficiency   . Anxiety   . Depression   . Hyperlipidemia   . Hypothyroid   . CAD (coronary artery disease)     nonobstructive plaque  . Obesity     morbid, seen dietician 09/2011  . T2DM (type 2 diabetes mellitus)   . Vitamin D deficiency     last check 47 (11/2010)  . Fibula fracture   . Fatty liver 2006    [place]  . 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 Diastolic CHF 08/2011    EF 55-60%, nl valves  . RLS (restless legs syndrome)   . Arthritis   . GERD (gastroesophageal reflux disease)   . Hypertension   . Complication of anesthesia     hard time waking up 02 sats 85-89% range   . OSA (obstructive sleep apnea)     medium quattro full face mask CPAP 10cm  . Pneumonia     hx of pneumonia    Past Surgical History  Procedure Date  . Osteotomy and ulnar shortening 2001    [name]  . Knee surgery 11-02-2008    Partial medical and Lat Menisectomies and Condroplasty (Dr. Thurston Hole)  . Cardiac catheterization 10/12/09    EF 70%  . Mr abdomen 01/16/2011  Left 4cm adrenal myelolipoma  . Cardiac catheterization 08/23/2011    Mild nonobstructive CAD, normal LV fxn  . US echocardiography 08/2011    EF of 55-60% and indeterminate diastolic function  . Esophagogastroduodenoscopy 2006  . Ventral hernia repair 03/01/2012    Procedure: LAPAROSCOPIC VENTRAL HERNIA;  Surgeon: Axel Filler, MD;  Location: [facility];  Service: General;  Laterality: N/A;   History  Substance Use Topics  . Smoking status: Former Smoker    Quit date: 08/08/1997  . Smokeless tobacco: Never Used  . Alcohol Use: No   Family History  Problem Relation Age of Onset  . Coronary artery disease Mother 73    14 angioplasty and 5v CABG  . Stroke Mother   . Diabetes Mother   . Heart attack Father 45  . Heart attack Brother 28    deceased age 54   Allergies  Allergen Reactions  . Erythromycin Base  Nausea And Vomiting  . Neomycin     vomiting    Current Outpatient Prescriptions on File Prior to Visit  Medication Sig Dispense Refill  . acetaminophen (TYLENOL) 325 MG tablet Take 2 tablets (650 mg total) by mouth every 4 (four) hours as needed (or Fever >/= 101).      Marland Kitchen ALPRAZolam (XANAX) 0.25 MG tablet Take 0.25 mg by mouth 2 (two) times daily. For anxiety      . aspirin 81 MG tablet Take 81 mg by mouth daily.      Marland Kitchen atorvastatin (LIPITOR) 40 MG tablet Take 1 tablet (40 mg total) by mouth at bedtime.  90 tablet  3  . cetirizine (ZYRTEC) 10 MG tablet Take 10 mg by mouth daily.      . cholecalciferol (VITAMIN D) 1000 UNITS tablet Take 1,000 Units by mouth daily.       . cyanocobalamin (,VITAMIN B-12,) 1000 MCG/ML injection Inject 1 mL (1,000 mcg total) into the muscle every 30 (thirty) days.  10 mL  3  . furosemide (LASIX) 40 MG tablet Take 40 mg by mouth every morning.      . glyBURIDE-metformin (GLUCOVANCE) 1.25-250 MG per tablet Take 1 tablet by mouth daily with breakfast.  90 tablet  3  . lamoTRIgine (LAMICTAL) 150 MG tablet Take 150 mg by mouth at bedtime.       Marland Kitchen levothyroxine (SYNTHROID, LEVOTHROID) 175 MCG tablet Take 175 mcg by mouth every morning.      Marland Kitchen NEEDLE, DISP, 25 G (MONOJECT MAGELLAN SAFETY NDL) 25G X 1" MISC by Does not apply route as directed.        . nystatin ointment (MYCOSTATIN) Apply 1 application topically daily as needed. Apply to red areas in groin      . potassium chloride (KLOR-CON 10) 10 MEQ tablet Take 2 tablets (20 mEq total) by mouth daily.  180 tablet  3  . ramipril (ALTACE) 10 MG capsule Take 1 capsule (10 mg total) by mouth daily.  90 capsule  3  . rOPINIRole (REQUIP) 1 MG tablet Take 1 mg by mouth daily.       . Vilazodone HCl (VIIBRYD) 40 MG TABS Take 40 mg by mouth every morning.      Marland Kitchen albuterol (PROVENTIL HFA;VENTOLIN HFA) 108 (90 BASE) MCG/ACT inhaler Inhale 2 puffs into the lungs every 6 (six) hours as needed for wheezing.  1 Inhaler  3  .  omeprazole (PRILOSEC) 40 MG capsule Take 1 capsule (40 mg total) by mouth daily.  90 capsule  3    Review  of Systems Per HPI    Objective:   Physical Exam  Nursing note and vitals reviewed. Constitutional: She appears well-developed and well-nourished. No distress.       obese  HENT:  Head: Normocephalic and atraumatic.  Mouth/Throat: Oropharynx is clear and moist. No oropharyngeal exudate.       mallampati 1  Eyes: Conjunctivae normal and EOM are normal. Pupils are equal, round, and reactive to light. No scleral icterus.  Neck: Normal range of motion. Neck supple.  Cardiovascular: Normal rate, regular rhythm, normal heart sounds and intact distal pulses.   No murmur heard. Pulmonary/Chest: Effort normal and breath sounds normal. No respiratory distress. She has no wheezes. She has no rales.  Musculoskeletal: She exhibits no edema.       Diabetic foot exam: Normal inspection No skin breakdown No calluses  Normal DP/PT pulses Normal sensation to light touch and slightly diminished on right sole to monofilament Nails normal  Lymphadenopathy:    She has no cervical adenopathy.  Skin: Skin is warm and dry. No rash noted.  Psychiatric: She has a normal mood and affect.       Assessment & Plan:

## 2012-06-06 NOTE — Assessment & Plan Note (Signed)
Prior followed by [facility].  Would like to establish with Dr. Vickey Huger. I will route today's note so she has updated history available. Likely reasonable to retest for CPAP settings. Doubt cardiac etiology of mild AM chest tightness - non exertional, latest catheterization 08/2011 with mild nonobstructive disease.

## 2012-06-06 NOTE — Assessment & Plan Note (Signed)
Stable at last check.  Will need regular monitoring with recent weight loss.

## 2012-06-10 ENCOUNTER — Encounter: Payer: Self-pay | Admitting: *Deleted

## 2012-07-01 ENCOUNTER — Ambulatory Visit: Payer: BC Managed Care – PPO | Admitting: Family Medicine

## 2012-08-01 ENCOUNTER — Telehealth: Payer: Self-pay | Admitting: *Deleted

## 2012-08-01 NOTE — Telephone Encounter (Signed)
plz notify - I did fax on Monday responses to letter received late February 2014, but I have not received any other questionairres from [place] since late February.

## 2012-08-01 NOTE — Telephone Encounter (Signed)
Misty Stanley with [employer]. wants to know if you have received a medical questionnaire letter she faxed on Monday and if you have responded to it

## 2012-08-02 NOTE — Telephone Encounter (Signed)
Called [name] and advise her that you didn't receive anything this week so she refaxed the questionnaire, and I placed it in your InBox

## 2012-08-05 ENCOUNTER — Telehealth: Payer: Self-pay | Admitting: Cardiology

## 2012-08-05 NOTE — Telephone Encounter (Signed)
New Prob   Pt was calling to schedule her annual visit with Dr. Daleen Squibb. No slots open and does not wish to see an extender. Would like to speak to nurse about how to move forward.

## 2012-08-05 NOTE — Telephone Encounter (Signed)
Filled and placed in my out box. 

## 2012-08-06 NOTE — Telephone Encounter (Signed)
Returned call to patient no answer.Left message on personal voice will send message to Dr.Wall's nurse to schedule appointment with Dr.Wall.

## 2012-08-07 NOTE — Telephone Encounter (Signed)
Pt calls b/c [name] is due for her yearly appointment and has been having some fluid build up at times. The patient says this is doing fine now. [name] is aware as of her last appt with Dr. Daleen Squibb that he is not in the office as often. [name] will discuss referring her when [name] comes in for her appt on August 20, 2012. Reassurance given Mylo Red RN

## 2012-08-20 ENCOUNTER — Encounter: Payer: Self-pay | Admitting: Cardiology

## 2012-08-20 ENCOUNTER — Ambulatory Visit (INDEPENDENT_AMBULATORY_CARE_PROVIDER_SITE_OTHER): Payer: BC Managed Care – PPO | Admitting: Cardiology

## 2012-08-20 VITALS — BP 110/82 | HR 64 | Ht 65.6 in | Wt 289.0 lb

## 2012-08-20 DIAGNOSIS — E785 Hyperlipidemia, unspecified: Secondary | ICD-10-CM

## 2012-08-20 DIAGNOSIS — G4733 Obstructive sleep apnea (adult) (pediatric): Secondary | ICD-10-CM

## 2012-08-20 DIAGNOSIS — I2510 Atherosclerotic heart disease of native coronary artery without angina pectoris: Secondary | ICD-10-CM

## 2012-08-20 DIAGNOSIS — I5032 Chronic diastolic (congestive) heart failure: Secondary | ICD-10-CM

## 2012-08-20 DIAGNOSIS — R0789 Other chest pain: Secondary | ICD-10-CM

## 2012-08-20 NOTE — Patient Instructions (Addendum)
Dr. Daleen Squibb has given you a 10 pound weight window.  When you gain more than 10 lbs, take an extra lasix and potassium.  When you are 10 lbs lighter, hold the lasix and potassium.  Your physician wants you to follow-up in: 1 year with Dr. Mariah Milling in [place]. You will receive a reminder letter in the mail two months in advance. If you don't receive a letter, please call our office to schedule the follow-up appointment.

## 2012-08-20 NOTE — Progress Notes (Signed)
HPI [name] returns today for evaluation and management of her history of nonobstructive coronary disease, chronic diastolic heart failure, morbid obesity, as well as type 2 diabetes.  Her biggest complaint is having significant weight shifts on a daily basis over the past month or so. Her weight compares much is 6 pounds in a day. She has lost some weight on her diet. She denies orthopnea PND or significant edema change. She has some chest discomfort but is more fullness 1 her weight is up.  She has sleep apnea and wears CPAP which was just adjusted. Despite this, she feels tired all during the day. She is really gotten frustrated with not feeling well particularly with this whole concept of heart failure.  Past Medical History  Diagnosis Date  . Vitamin B 12 deficiency   . Anxiety   . Depression   . Hyperlipidemia   . Hypothyroid   . CAD (coronary artery disease)     nonobstructive plaque  . Obesity     morbid, seen dietician 09/2011  . T2DM (type 2 diabetes mellitus)   . Vitamin D deficiency     last check 48 (11/2010)  . Fibula fracture   . Fatty liver 2006    [place]  . 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 Diastolic CHF 08/2011    EF 55-60%, nl valves  . RLS (restless legs syndrome)   . Arthritis   . GERD (gastroesophageal reflux disease)   . Hypertension   . Complication of anesthesia     hard time waking up 02 sats 85-89% range   . OSA (obstructive sleep apnea)     medium quattro full face mask CPAP 10cm  . Pneumonia     hx of pneumonia     Current Outpatient Prescriptions  Medication Sig Dispense Refill  . acetaminophen (TYLENOL) 325 MG tablet Take 2 tablets (650 mg total) by mouth every 4 (four) hours as needed (or Fever >/= 101).      Marland Kitchen albuterol (PROVENTIL HFA;VENTOLIN HFA) 108 (90 BASE) MCG/ACT inhaler Inhale 2 puffs into the lungs every 6 (six) hours as needed for wheezing.  1 Inhaler  3  . ALPRAZolam (XANAX) 0.25 MG tablet Take 0.25 mg by mouth 2  (two) times daily. For anxiety      . aspirin 81 MG tablet Take 81 mg by mouth daily.      Marland Kitchen atorvastatin (LIPITOR) 40 MG tablet Take 1 tablet (40 mg total) by mouth at bedtime.  90 tablet  3  . cetirizine (ZYRTEC) 10 MG tablet Take 10 mg by mouth daily.      . cholecalciferol (VITAMIN D) 1000 UNITS tablet Take 1,000 Units by mouth daily.       . cyanocobalamin (,VITAMIN B-12,) 1000 MCG/ML injection Inject 1 mL (1,000 mcg total) into the muscle every 30 (thirty) days.  10 mL  3  . furosemide (LASIX) 40 MG tablet Take 40 mg by mouth every morning.      . lamoTRIgine (LAMICTAL) 200 MG tablet Take 200 mg by mouth daily.      Marland Kitchen levothyroxine (SYNTHROID, LEVOTHROID) 175 MCG tablet Take 175 mcg by mouth every morning.      . metFORMIN (GLUCOPHAGE) 500 MG tablet Take 0.5 tablets (250 mg total) by mouth daily with breakfast.  45 tablet  3  . NEEDLE, DISP, 25 G (MONOJECT MAGELLAN SAFETY NDL) 25G X 1" MISC by Does not apply route as directed.        Marland Kitchen  nystatin ointment (MYCOSTATIN) Apply 1 application topically daily as needed. Apply to red areas in groin      . omeprazole (PRILOSEC) 40 MG capsule Take 1 capsule (40 mg total) by mouth daily.  90 capsule  3  . potassium chloride (KLOR-CON 10) 10 MEQ tablet Take 2 tablets (20 mEq total) by mouth daily.  180 tablet  3  . ramipril (ALTACE) 10 MG capsule Take 1 capsule (10 mg total) by mouth daily.  90 capsule  3  . rOPINIRole (REQUIP) 1 MG tablet Take 1 mg by mouth daily.       . Vilazodone HCl (VIIBRYD) 40 MG TABS Take 40 mg by mouth every morning.       No current facility-administered medications for this visit.    Allergies  Allergen Reactions  . Erythromycin Base Nausea And Vomiting  . Neomycin     vomiting     Family History  Problem Relation Age of Onset  . Coronary artery disease Mother 68    14 angioplasty and 5v CABG  . Stroke Mother   . Diabetes Mother   . Heart attack Father 21  . Heart attack Brother 54    deceased age 33     History   Social History  . Marital Status: Divorced    Spouse Name: N/A    Number of Children: 1  . Years of Education: N/A   Occupational History  . Nature conservation officer    Social History Main Topics  . Smoking status: Former Smoker    Quit date: 08/08/1997  . Smokeless tobacco: Never Used  . Alcohol Use: No  . Drug Use: No  . Sexually Active: No   Other Topics Concern  . Not on file   Social History Narrative   Caffeine: 1 cup coffee/day   Lives with daughter   [facility]   Diet: watching diet, low sodium. Good water.  Fruits/vegetables daily.      Adenosine Myoview nml 08/20/07   HOSP CP R/O'd Nonobstruct CAD by Cath EF 60% 5/27-10/13/2009   CATH and ECHO Nonobstr CAD  EF 60% 10/12/2009    ROS ALL NEGATIVE EXCEPT THOSE NOTED IN HPI  PE  General Appearance: well developed, well nourished in no acute distress, obese HEENT: symmetrical face, PERRLA, good dentition  Neck: no JVD, thyromegaly, or adenopathy, trachea midline Chest: symmetric without deformity Cardiac: PMI difficult to appreciate, RRR, normal S1, S2, no gallop or murmur Lung: clear to ausculation and percussion Vascular: all pulses full without bruits  Abdominal: Difficult to assess because of her obesity. Extremities: no cyanosis, clubbing ,minimal edema, no sign of DVT, no varicosities  Skin: normal color, no rashes Neuro: alert and oriented x 3, non-focal Pysch: normal affect  EKG Normal sinus rhythm, normal EKG  BMET    Component Value Date/Time   NA 138 02/28/2012 1020   K 3.4* 02/28/2012 1020   K 3.5 08/17/2010   CL 98 02/28/2012 1020   CO2 28 02/28/2012 1020   GLUCOSE 99 02/28/2012 1020   BUN 20 02/28/2012 1020   CREATININE 0.82 02/28/2012 1020   CALCIUM 9.6 02/28/2012 1020   GFRNONAA 77* 02/28/2012 1020   GFRAA 89* 02/28/2012 1020    Lipid Panel     Component Value Date/Time   CHOL 165 11/28/2011 0928   TRIG 120.0 11/28/2011 0928   TRIG 108  08/17/2010   HDL 58.30 11/28/2011 0928   CHOLHDL 3 11/28/2011 0928   VLDL  24.0 11/28/2011 0928   LDLCALC 83 11/28/2011 0928   LDLCALC 77 08/17/2010    CBC    Component Value Date/Time   WBC 5.1 02/28/2012 1020   RBC 5.13* 02/28/2012 1020   HGB 13.9 02/28/2012 1020   HCT 43.2 02/28/2012 1020   PLT 243 02/28/2012 1020   MCV 84.2 02/28/2012 1020   MCH 27.1 02/28/2012 1020   MCHC 32.2 02/28/2012 1020   RDW 14.5 02/28/2012 1020   LYMPHSABS 1.5 02/19/2012 1115   MONOABS 0.6 02/19/2012 1115   EOSABS 0.2 02/19/2012 1115   BASOSABS 0.1 02/19/2012 1115

## 2012-08-20 NOTE — Assessment & Plan Note (Addendum)
This is the major underlying cause of her medical problems. She has lost some weight on her diet. I've encouraged her to continue losing weight.

## 2012-08-20 NOTE — Assessment & Plan Note (Signed)
Nonobstructive, asymptomatic, continue secondary preventative therapy.

## 2012-08-20 NOTE — Assessment & Plan Note (Addendum)
I've given her a 10 pound window with her weight.. She will double her Lasix and potassium if she is above 10 pound limit and she will hold it below that 10 pound range. She weighed daily. I've also asked her to watch sodium closely.

## 2012-09-04 ENCOUNTER — Ambulatory Visit: Payer: Self-pay | Admitting: Family Medicine

## 2012-10-22 ENCOUNTER — Other Ambulatory Visit: Payer: Self-pay | Admitting: Cardiology

## 2012-11-06 ENCOUNTER — Encounter: Payer: Self-pay | Admitting: Nurse Practitioner

## 2012-11-06 ENCOUNTER — Ambulatory Visit (INDEPENDENT_AMBULATORY_CARE_PROVIDER_SITE_OTHER): Payer: BC Managed Care – PPO | Admitting: Nurse Practitioner

## 2012-11-06 DIAGNOSIS — H469 Unspecified optic neuritis: Secondary | ICD-10-CM

## 2012-11-06 DIAGNOSIS — G4733 Obstructive sleep apnea (adult) (pediatric): Secondary | ICD-10-CM

## 2012-11-06 MED ORDER — TOPIRAMATE 25 MG PO CPSP: 25.0000 mg | ORAL_CAPSULE | Freq: Two times a day (BID) | ORAL | Status: DC

## 2012-11-06 NOTE — Progress Notes (Signed)
HPI: Patient returns for followup after last visit with Dr. Vickey Huger 07/08/2012 she was initially evaluated 11/14/11 for  retrobulbar optic neuritis.  She noted over two to three months,  a change in visual acuity.  She noted she had to take her glasses off to read and could see less in the distance. Her optometrist noted  optic nerve swelling, normal visual fields. Supposedly, he monitored this over many months. The patient reports having a history of depression,  recently started treatment with Vibriid, has  before been on Lamictal and Trazodone, Lexapro, Xanax  all at the same time. "Felt like a zombie " and wondered if these could have had an effect on her vision-  and read about the side effect panel,  she felt  convinced that this is related..  The patient transfers her sleep care  to [facility] - has a CPAP from "feeling  [facility]" sleep lab but no follow up. Download here at office. RLS and OSA, congestion  and rhinitis complicate her CPAP use.  She was started on Topamax for headaches prevention. MRI results without  evidence of MS , only small vessel disease. The TPM is meant to help with retroorbital pressure also.  She is here today for CPAP compliance check     ROS: Easy bruising, allergies, joint pain, cough, swelling in legs depression and anxiety    Physical Exam General: well developed, obese female  seated, in no evident distress Head: head normocephalic and atraumatic. Oropharynx benign Neck: supple with no carotid  bruits Cardiovascular: regular rate and rhythm, no murmurs Skin: mild pedal edema  Neurologic Exam Mental Status: Awake and fully alert. Oriented to place and time. Follows all commands. Speech and language normal. ESS 9  Cranial Nerves: Fundoscopic exam without papilledema  Pupils equal, briskly reactive to light. Extraocular movements full without nystagmus. Visual fields full to confrontation. Hearing intact and symmetric to finger snap. Facial sensation intact. Face, tongue,  palate move normally and symmetrically.   Motor: Normal bulk and tone. Normal strength in all tested extremity muscles.No focal weakness Coordination: Rapid alternating movements normal in all extremities. Finger-to-nose  performed accurately bilaterally. Gait and Station: Arises from chair without difficulty. Stance is wide-based  Able to heel, toe and mildly unsteady with tandem walk.  Reflexes: 2+ and symmetric. Toes downgoing.     ASSESSMENT: History of mild optic disc swelling, MRI of the brain with small vessel disease no MS. Obstructive sleep apnea CPAP here for compliance check, ESS 9     PLAN: Discussed CPAP download with Dr. Vickey Huger  CPAP download , AHI 2.8. Average daily use is 5.5 Pressures at 10 cm Median daily usage 6.39 Report was for 35 days Continue to take topiramate as directed Nilda Riggs, GNP-BC APRN

## 2012-11-06 NOTE — Patient Instructions (Addendum)
Good compliance with CPAP Continue same settings DME is [facility] Followup yearly

## 2012-11-11 ENCOUNTER — Encounter: Payer: Self-pay | Admitting: Neurology

## 2012-11-21 ENCOUNTER — Other Ambulatory Visit: Payer: Self-pay

## 2012-11-22 ENCOUNTER — Encounter: Payer: Self-pay | Admitting: Nurse Practitioner

## 2012-11-26 MED ORDER — TOPIRAMATE 25 MG PO CPSP: 25.0000 mg | ORAL_CAPSULE | Freq: Two times a day (BID) | ORAL | Status: DC

## 2012-11-27 ENCOUNTER — Telehealth: Payer: Self-pay | Admitting: Family Medicine

## 2012-11-27 ENCOUNTER — Emergency Department (HOSPITAL_COMMUNITY): Payer: BC Managed Care – PPO

## 2012-11-27 ENCOUNTER — Encounter (HOSPITAL_COMMUNITY): Payer: Self-pay | Admitting: *Deleted

## 2012-11-27 ENCOUNTER — Observation Stay (HOSPITAL_COMMUNITY)
Admission: EM | Admit: 2012-11-27 | Discharge: 2012-11-28 | Disposition: A | Discharge status: Home / Self Care | Payer: BC Managed Care – PPO | Attending: Cardiovascular Disease | Admitting: Cardiovascular Disease

## 2012-11-27 DIAGNOSIS — E785 Hyperlipidemia, unspecified: Secondary | ICD-10-CM | POA: Insufficient documentation

## 2012-11-27 DIAGNOSIS — I509 Heart failure, unspecified: Secondary | ICD-10-CM | POA: Insufficient documentation

## 2012-11-27 DIAGNOSIS — I2510 Atherosclerotic heart disease of native coronary artery without angina pectoris: Secondary | ICD-10-CM | POA: Insufficient documentation

## 2012-11-27 DIAGNOSIS — R079 Chest pain, unspecified: Principal | ICD-10-CM | POA: Insufficient documentation

## 2012-11-27 DIAGNOSIS — E119 Type 2 diabetes mellitus without complications: Secondary | ICD-10-CM | POA: Insufficient documentation

## 2012-11-27 DIAGNOSIS — I10 Essential (primary) hypertension: Secondary | ICD-10-CM | POA: Insufficient documentation

## 2012-11-27 DIAGNOSIS — I5032 Chronic diastolic (congestive) heart failure: Secondary | ICD-10-CM | POA: Insufficient documentation

## 2012-11-27 DIAGNOSIS — E039 Hypothyroidism, unspecified: Secondary | ICD-10-CM | POA: Insufficient documentation

## 2012-11-27 DIAGNOSIS — Z79899 Other long term (current) drug therapy: Secondary | ICD-10-CM | POA: Insufficient documentation

## 2012-11-27 DIAGNOSIS — R0602 Shortness of breath: Secondary | ICD-10-CM | POA: Insufficient documentation

## 2012-11-27 DIAGNOSIS — R0789 Other chest pain: Secondary | ICD-10-CM | POA: Diagnosis present

## 2012-11-27 LAB — BASIC METABOLIC PANEL
BUN: 29 mg/dL — ABNORMAL HIGH (ref 6–23)
Calcium: 9.3 mg/dL (ref 8.4–10.5)
Creatinine, Ser: 0.86 mg/dL (ref 0.50–1.10)
GFR calc non Af Amer: 72 mL/min — ABNORMAL LOW (ref >90)
Glucose, Bld: 115 mg/dL — ABNORMAL HIGH (ref 70–99)
Sodium: 142 mEq/L (ref 135–145)

## 2012-11-27 LAB — CBC
MCH: 29.8 pg (ref 26.0–34.0)
MCHC: 33.7 g/dL (ref 30.0–36.0)
MCV: 88.4 fL (ref 78.0–100.0)
Platelets: 203 10*3/uL (ref 150–400)
RDW: 14.6 % (ref 11.5–15.5)

## 2012-11-27 MED ORDER — MORPHINE SULFATE 4 MG/ML IJ SOLN
4.0000 mg | Freq: Once | INTRAMUSCULAR | Status: AC
  Administered 2012-11-27: 4 mg via INTRAVENOUS
  Filled 2012-11-27: qty 1

## 2012-11-27 MED ORDER — ALPRAZOLAM 0.25 MG PO TABS: 0.2500 mg | ORAL_TABLET | Freq: Two times a day (BID) | ORAL | Status: DC | PRN

## 2012-11-27 MED ORDER — ATORVASTATIN CALCIUM 40 MG PO TABS
40.0000 mg | ORAL_TABLET | Freq: Every day | ORAL | Status: DC
  Administered 2012-11-27: 40 mg via ORAL
  Filled 2012-11-27 (×2): qty 1

## 2012-11-27 MED ORDER — SODIUM CHLORIDE 0.9 % IJ SOLN: 3.0000 mL | INTRAMUSCULAR | Status: DC | PRN

## 2012-11-27 MED ORDER — TOPIRAMATE 25 MG PO CPSP
25.0000 mg | ORAL_CAPSULE | Freq: Two times a day (BID) | ORAL | Status: DC
Start: 2012-11-27 — End: 2012-11-27

## 2012-11-27 MED ORDER — PANTOPRAZOLE SODIUM 40 MG PO TBEC
80.0000 mg | DELAYED_RELEASE_TABLET | Freq: Every day | ORAL | Status: DC
  Administered 2012-11-27 – 2012-11-28 (×2): 80 mg via ORAL
  Filled 2012-11-27 (×4): qty 1

## 2012-11-27 MED ORDER — ROPINIROLE HCL 1 MG PO TABS
1.0000 mg | ORAL_TABLET | Freq: Two times a day (BID) | ORAL | Status: DC
Start: 2012-11-27 — End: 2012-11-28
  Administered 2012-11-27 – 2012-11-28 (×2): 1 mg via ORAL
  Filled 2012-11-27 (×3): qty 1

## 2012-11-27 MED ORDER — LORATADINE 10 MG PO TABS
10.0000 mg | ORAL_TABLET | Freq: Every day | ORAL | Status: DC
  Administered 2012-11-27 – 2012-11-28 (×2): 10 mg via ORAL
  Filled 2012-11-27 (×2): qty 1

## 2012-11-27 MED ORDER — POTASSIUM CHLORIDE ER 10 MEQ PO TBCR
20.0000 meq | EXTENDED_RELEASE_TABLET | Freq: Every day | ORAL | Status: DC
  Administered 2012-11-28: 20 meq via ORAL
  Filled 2012-11-27: qty 2

## 2012-11-27 MED ORDER — ALPRAZOLAM 0.25 MG PO TABS
0.2500 mg | ORAL_TABLET | Freq: Two times a day (BID) | ORAL | Status: DC
  Administered 2012-11-27 – 2012-11-28 (×2): 0.25 mg via ORAL
  Filled 2012-11-27 (×2): qty 1

## 2012-11-27 MED ORDER — LAMOTRIGINE 200 MG PO TABS
200.0000 mg | ORAL_TABLET | Freq: Every day | ORAL | Status: DC
  Administered 2012-11-27 – 2012-11-28 (×2): 200 mg via ORAL
  Filled 2012-11-27 (×2): qty 1

## 2012-11-27 MED ORDER — FENTANYL CITRATE 0.05 MG/ML IJ SOLN
50.0000 ug | Freq: Once | INTRAMUSCULAR | Status: AC
  Administered 2012-11-27: 50 ug via INTRAVENOUS
  Filled 2012-11-27: qty 2

## 2012-11-27 MED ORDER — NITROGLYCERIN 0.4 MG SL SUBL: 0.4000 mg | SUBLINGUAL_TABLET | SUBLINGUAL | Status: DC | PRN

## 2012-11-27 MED ORDER — ACETAMINOPHEN 325 MG PO TABS
650.0000 mg | ORAL_TABLET | ORAL | Status: DC | PRN
  Administered 2012-11-28: 650 mg via ORAL

## 2012-11-27 MED ORDER — SODIUM CHLORIDE 0.9 % IV SOLN: 250.0000 mL | INTRAVENOUS | Status: DC | PRN

## 2012-11-27 MED ORDER — RAMIPRIL 10 MG PO CAPS
10.0000 mg | ORAL_CAPSULE | Freq: Every day | ORAL | Status: DC
  Administered 2012-11-27 – 2012-11-28 (×2): 10 mg via ORAL
  Filled 2012-11-27 (×2): qty 1

## 2012-11-27 MED ORDER — SODIUM CHLORIDE 0.9 % IJ SOLN
3.0000 mL | Freq: Two times a day (BID) | INTRAMUSCULAR | Status: DC
  Administered 2012-11-27: 3 mL via INTRAVENOUS

## 2012-11-27 MED ORDER — TOPIRAMATE 25 MG PO TABS
25.0000 mg | ORAL_TABLET | Freq: Two times a day (BID) | ORAL | Status: DC
  Administered 2012-11-27 – 2012-11-28 (×2): 25 mg via ORAL
  Filled 2012-11-27 (×3): qty 1

## 2012-11-27 MED ORDER — VILAZODONE HCL 40 MG PO TABS
40.0000 mg | ORAL_TABLET | Freq: Every morning | ORAL | Status: DC
  Administered 2012-11-28: 40 mg via ORAL
  Filled 2012-11-27: qty 1

## 2012-11-27 MED ORDER — FUROSEMIDE 40 MG PO TABS
40.0000 mg | ORAL_TABLET | Freq: Every day | ORAL | Status: DC
  Administered 2012-11-28: 40 mg via ORAL
  Filled 2012-11-27: qty 1

## 2012-11-27 MED ORDER — NITROGLYCERIN 0.4 MG SL SUBL
0.4000 mg | SUBLINGUAL_TABLET | SUBLINGUAL | Status: DC | PRN
  Administered 2012-11-27: 0.4 mg via SUBLINGUAL
  Filled 2012-11-27 (×2): qty 25

## 2012-11-27 MED ORDER — LEVOTHYROXINE SODIUM 175 MCG PO TABS
175.0000 ug | ORAL_TABLET | Freq: Every morning | ORAL | Status: DC
  Administered 2012-11-28: 175 ug via ORAL
  Filled 2012-11-27: qty 1

## 2012-11-27 MED ORDER — ENOXAPARIN SODIUM 40 MG/0.4ML ~~LOC~~ SOLN
40.0000 mg | SUBCUTANEOUS | Status: DC
  Administered 2012-11-27: 40 mg via SUBCUTANEOUS
  Filled 2012-11-27 (×2): qty 0.4

## 2012-11-27 MED ORDER — METFORMIN HCL 500 MG PO TABS
250.0000 mg | ORAL_TABLET | Freq: Every day | ORAL | Status: DC
  Administered 2012-11-28: 250 mg via ORAL
  Filled 2012-11-27 (×2): qty 1

## 2012-11-27 MED ORDER — ASPIRIN 81 MG PO TABS: 81.0000 mg | ORAL_TABLET | Freq: Every day | ORAL | Status: DC

## 2012-11-27 MED ORDER — ALBUTEROL SULFATE HFA 108 (90 BASE) MCG/ACT IN AERS
2.0000 | INHALATION_SPRAY | Freq: Four times a day (QID) | RESPIRATORY_TRACT | Status: DC | PRN
  Filled 2012-11-27: qty 6.7

## 2012-11-27 MED ORDER — ONDANSETRON HCL 4 MG/2ML IJ SOLN: 4.0000 mg | Freq: Four times a day (QID) | INTRAMUSCULAR | Status: DC | PRN

## 2012-11-27 MED ORDER — REGADENOSON 0.4 MG/5ML IV SOLN
0.4000 mg | Freq: Once | INTRAVENOUS | Status: AC
  Administered 2012-11-28: 0.4 mg via INTRAVENOUS
  Filled 2012-11-27: qty 5

## 2012-11-27 MED ORDER — DIPHENHYDRAMINE HCL 25 MG PO CAPS: 25.0000 mg | ORAL_CAPSULE | Freq: Four times a day (QID) | ORAL | Status: DC | PRN

## 2012-11-27 MED ORDER — ASPIRIN EC 81 MG PO TBEC
81.0000 mg | DELAYED_RELEASE_TABLET | Freq: Every day | ORAL | Status: DC
  Administered 2012-11-27 – 2012-11-28 (×2): 81 mg via ORAL
  Filled 2012-11-27 (×2): qty 1

## 2012-11-27 NOTE — ED Provider Notes (Signed)
History    CSN: [id number] Arrival date & time 11/27/12  1110  First MD Initiated Contact with Patient 11/27/12 1122     Chief Complaint  Patient presents with  . Chest Pain   (Consider location/radiation/quality/duration/timing/severity/associated sxs/prior Treatment) HPI  Patient awoke approximately 5 hours ago with chest pain.  Since onset the pain has been persistent, right-sided, with intermittent radiation to the left.  The pain is sore, moderate. There is no new associated dyspnea, nausea, lightheadedness, syncope, new lower extremity edema, or other notable complaints. There were no precipitating, alleviating, exacerbating factors. Patient's with her physician, was referred here for further evaluation and management.   Past Medical History  Diagnosis Date  . Vitamin B 12 deficiency   . Anxiety   . Depression   . Hyperlipidemia   . Hypothyroid   . CAD (coronary artery disease)     nonobstructive plaque  . Obesity     morbid, seen dietician 09/2011  . T2DM (type 2 diabetes mellitus)   . Vitamin D deficiency     last check 47 (11/2010)  . Fibula fracture   . Fatty liver 2006    [place]  . 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 Diastolic CHF 08/2011    EF 55-60%, nl valves  . RLS (restless legs syndrome)   . Arthritis   . GERD (gastroesophageal reflux disease)   . Hypertension   . Complication of anesthesia     hard time waking up 02 sats 85-89% range   . OSA (obstructive sleep apnea)     medium quattro full face mask CPAP 10cm  . Pneumonia     hx of pneumonia    Past Surgical History  Procedure Laterality Date  . Osteotomy and ulnar shortening  2001    [name]  . Knee surgery  11-02-2008    Partial medical and Lat Menisectomies and Condroplasty (Dr. Thurston Hole)  . Cardiac catheterization  10/12/09    EF 70%  . Mr abdomen  01/16/2011    Left 4cm adrenal myelolipoma  . Cardiac catheterization  08/23/2011    Mild nonobstructive CAD, normal LV fxn  . US  echocardiography  08/2011    EF of 55-60% and indeterminate diastolic function  . Esophagogastroduodenoscopy  2006  . Ventral hernia repair  03/01/2012    Procedure: LAPAROSCOPIC VENTRAL HERNIA;  Surgeon: Axel Filler, MD;  Location: [facility];  Service: General;  Laterality: N/A;   Family History  Problem Relation Age of Onset  . Coronary artery disease Mother 37    14 angioplasty and 5v CABG  . Stroke Mother   . Diabetes Mother   . Heart attack Father 50  . Heart attack Brother 65    deceased age 22   History  Substance Use Topics  . Smoking status: Former Smoker    Quit date: 08/08/1997  . Smokeless tobacco: Never Used  . Alcohol Use: No   OB History   Grav Para Term Preterm Abortions TAB SAB Ect Mult Living                 Review of Systems  Constitutional:       Per HPI, otherwise negative  HENT:       Per HPI, otherwise negative  Respiratory:       Per HPI, otherwise negative  Cardiovascular:       Per HPI, otherwise negative  Gastrointestinal: Negative for vomiting.  Endocrine:  Negative aside from HPI  Genitourinary:       Neg aside from HPI   Musculoskeletal:       Per HPI, otherwise negative  Skin: Negative.   Neurological: Negative for syncope.    Allergies  Erythromycin base and Neomycin  Home Medications   Current Outpatient Rx  Name  Route  Sig  Dispense  Refill  . acetaminophen (TYLENOL) 325 MG tablet   Oral   Take 2 tablets (650 mg total) by mouth every 4 (four) hours as needed (or Fever >/= 101).         Marland Kitchen ALPRAZolam (XANAX) 0.25 MG tablet   Oral   Take 0.25 mg by mouth 2 (two) times daily. For anxiety         . aspirin 81 MG tablet   Oral   Take 81 mg by mouth daily.         Marland Kitchen atorvastatin (LIPITOR) 40 MG tablet   Oral   Take 1 tablet (40 mg total) by mouth at bedtime.   90 tablet   3   . cetirizine (ZYRTEC) 10 MG tablet   Oral   Take 10 mg by mouth daily.         . cyanocobalamin (,VITAMIN B-12,) 1000 MCG/ML  injection   Intramuscular   Inject 1 mL (1,000 mcg total) into the muscle every 30 (thirty) days.   10 mL   3   . diphenhydrAMINE (BENADRYL ALLERGY) 25 mg capsule   Oral   Take 25 mg by mouth every 6 (six) hours as needed for allergies.         . fexofenadine (ALLEGRA) 30 MG tablet   Oral   Take 30 mg by mouth 2 (two) times daily.         . furosemide (LASIX) 40 MG tablet      TAKE 1 TABLET BY MOUTH EVERY DAY   30 tablet   6   . lamoTRIgine (LAMICTAL) 200 MG tablet   Oral   Take 200 mg by mouth daily.         Marland Kitchen levothyroxine (SYNTHROID, LEVOTHROID) 175 MCG tablet   Oral   Take 175 mcg by mouth every morning.         . metFORMIN (GLUCOPHAGE) 500 MG tablet   Oral   Take 0.5 tablets (250 mg total) by mouth daily with breakfast.   45 tablet   3   . NEEDLE, DISP, 25 G (MONOJECT MAGELLAN SAFETY NDL) 25G X 1" MISC   Does not apply   by Does not apply route as directed.           . nystatin ointment (MYCOSTATIN)   Topical   Apply 1 application topically daily as needed. Apply to red areas in groin         . potassium chloride (KLOR-CON 10) 10 MEQ tablet   Oral   Take 2 tablets (20 mEq total) by mouth daily.   180 tablet   3   . ramipril (ALTACE) 10 MG capsule   Oral   Take 1 capsule (10 mg total) by mouth daily.   90 capsule   3   . rOPINIRole (REQUIP) 1 MG tablet   Oral   Take 1 mg by mouth 2 (two) times daily.          Marland Kitchen topiramate (TOPAMAX) 25 MG capsule   Oral   Take 1 capsule (25 mg total) by mouth 2 (two) times daily.   180  capsule   1   . Vilazodone HCl (VIIBRYD) 40 MG TABS   Oral   Take 40 mg by mouth every morning.         Marland Kitchen EXPIRED: albuterol (PROVENTIL HFA;VENTOLIN HFA) 108 (90 BASE) MCG/ACT inhaler   Inhalation   Inhale 2 puffs into the lungs every 6 (six) hours as needed for wheezing.   1 Inhaler   3     Or equivalent   . omeprazole (PRILOSEC) 40 MG capsule   Oral   Take 1 capsule (40 mg total) by mouth daily.   90  capsule   3    BP 104/43  Pulse 59  Temp(Src) 97.8 F (36.6 C) (Oral)  Resp 18  Wt 298 lb 3 oz (135.257 kg)  BMI 46.69 kg/m2  SpO2 96% Physical Exam  Nursing note and vitals reviewed. Constitutional: She is oriented to person, place, and time. She appears well-developed and well-nourished. No distress.  HENT:  Head: Normocephalic and atraumatic.  Eyes: Conjunctivae and EOM are normal.  Cardiovascular: Normal rate and regular rhythm.   Pulmonary/Chest: Effort normal and breath sounds normal. No stridor. No respiratory distress.  Abdominal: She exhibits no distension.  Musculoskeletal: She exhibits no edema.  Neurological: She is alert and oriented to person, place, and time. No cranial nerve deficit.  Skin: Skin is warm and dry.  Psychiatric: She has a normal mood and affect.    ED Course  Procedures (including critical care time) Labs Reviewed  CBC  BASIC METABOLIC PANEL  PRO B NATRIURETIC PEPTIDE   No results found. No diagnosis found. I reviewed the patient's chart.  Pulse ox 97% room air normal Cardiac 60 sinus rhythm normal EKG has a sinus rhythm, rate 61, low voltage, not appreciably changed from prior, abnormal.  Update: Patient and family made aware of all results  Update: Second troponin negative.  Patient states that her pain has returned. MDM  This patient with diastolic dysfunction presents with several hours of chest pain.  On exam she is awake and alert, in no distress.  She is however, uncomfortable appearing.  Patient's labs are largely reassuring, including serial troponins that were negative.  EKG is not remarkably different from prior.  With the patient's persistent pain, I discussed her case with our cardiology team who will assist with the disposition.  Gerhard Munch, MD 11/27/12 [phone]

## 2012-11-27 NOTE — Telephone Encounter (Signed)
Patient Information:  Caller Name: Stefani  Phone: [phone]  Patient: [name], [name]  Gender: Female  DOB: 04/02/1951  Age: 62 Years  PCP: [facility]  Office Follow Up:  Does the office need to follow up with this patient?: No  Instructions For The Office: N/A  RN Note:  Advised pt to pull over to a safe, public place and call 911 now. Pt to do so now.  Symptoms  Reason For Call & Symptoms: Pt calling regarding chest pain that radiates into neck. Left and right side. Lots of pressure. Hx of CAD and CHF. Pt is driving.  Reviewed Health History In EMR: Yes  Reviewed Medications In EMR: Yes  Reviewed Allergies In EMR: Yes  Reviewed Surgeries / Procedures: Yes  Date of Onset of Symptoms: 11/27/2012  Treatments Tried: ASA-1  Treatments Tried Worked: No  Guideline(s) Used:  Chest Pain  Disposition Per Guideline:   Call EMS 911 Now  Reason For Disposition Reached:   Chest pain lasting longer than 5 minutes and ANY of the following:  Over 43 years old Over 24 years old and at least one cardiac risk factor (i.e., high blood pressure, diabetes, high cholesterol, obesity, smoker or strong family history of heart disease) Pain is crushing, pressure-like, or heavy  Took nitroglycerin and chest pain was not relieved History of heart disease (i.e., angina, heart attack, bypass surgery, angioplasty, CHF)  Advice Given:  N/A  Patient Will Follow Care Advice:  YES

## 2012-11-27 NOTE — ED Notes (Signed)
Reports having onset of sharp chest pains this am, now has a chest heaviness. Denies any sob or n/v. Hx of chf, denies swelling to extremities. ekg done at triage. Airway intact.

## 2012-11-27 NOTE — H&P (Signed)
Patient ID: [name] MRN: [id number] DOB/AGE: 09-16-1950 62 y.o. Admit date: 11/27/2012  Primary Care Physician: Eustaquio Boyden Primary Cardiologist:  Juanito Doom  HPI: 62 yo female with history of mild non-obstructive CAD by cath 2013, HLD, DM, obesity, diastolic CHF, HTN, GERD, OSA in ED with c/o chest pain, weakness. In her normal state of health until yesterday when she was in the grocery store and began to feel weak with some SOB. She had onset of dull right sided chest pain this am with radiation to her right jaw. First two troponin negative. EKG unchanged. Persistent dull right sided pain for 12 hours. Weight stable at home. No LE edema. No orthopnea.   Review of systems complete and found to be negative unless listed above   Past Medical History  Diagnosis Date  . Vitamin B 12 deficiency   . Anxiety   . Depression   . Hyperlipidemia   . Hypothyroid   . CAD (coronary artery disease)     nonobstructive plaque  . Obesity     morbid, seen dietician 09/2011  . T2DM (type 2 diabetes mellitus)   . Vitamin D deficiency     last check 47 (11/2010)  . Fibula fracture   . Fatty liver 2006    [place]  . 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 Diastolic CHF 08/2011    EF 55-60%, nl valves  . RLS (restless legs syndrome)   . Arthritis   . GERD (gastroesophageal reflux disease)   . Hypertension   . Complication of anesthesia     hard time waking up 02 sats 85-89% range   . OSA (obstructive sleep apnea)     medium quattro full face mask CPAP 10cm  . Pneumonia     hx of pneumonia     Family History  Problem Relation Age of Onset  . Coronary artery disease Mother 72    14 angioplasty and 5v CABG  . Stroke Mother   . Diabetes Mother   . Heart attack Father 44  . Heart attack Brother 20    deceased age 17    History   Social History  . Marital Status: Divorced    Spouse Name: N/A    Number of Children: 1  . Years of Education: N/A   Occupational  History  . Nature conservation officer    Social History Main Topics  . Smoking status: Former Smoker    Quit date: 08/08/1997  . Smokeless tobacco: Never Used  . Alcohol Use: No  . Drug Use: No  . Sexually Active: No   Other Topics Concern  . Not on file   Social History Narrative   Caffeine: 1 cup coffee/day   Lives with daughter   [facility]   Diet: watching diet, low sodium. Good water.  Fruits/vegetables daily.      Adenosine Myoview nml 08/20/07   HOSP CP R/O'd Nonobstruct CAD by Cath EF 60% 5/27-10/13/2009   CATH and ECHO Nonobstr CAD  EF 60% 10/12/2009    Past Surgical History  Procedure Laterality Date  . Osteotomy and ulnar shortening  2001    [name]  . Knee surgery  11-02-2008    Partial medical and Lat Menisectomies and Condroplasty (Dr. Thurston Hole)  . Cardiac catheterization  10/12/09    EF 70%  . Mr abdomen  01/16/2011    Left 4cm adrenal myelolipoma  . Cardiac catheterization  08/23/2011  Mild nonobstructive CAD, normal LV fxn  . US echocardiography  08/2011    EF of 55-60% and indeterminate diastolic function  . Esophagogastroduodenoscopy  2006  . Ventral hernia repair  03/01/2012    Procedure: LAPAROSCOPIC VENTRAL HERNIA;  Surgeon: Axel Filler, MD;  Location: [facility];  Service: General;  Laterality: N/A;    Allergies  Allergen Reactions  . Erythromycin Base Nausea And Vomiting  . Neomycin     vomiting     Prior to Admission Meds:  Current Facility-Administered Medications  Medication Dose Route Frequency Provider Last Rate Last Dose  . nitroGLYCERIN (NITROSTAT) SL tablet 0.4 mg  0.4 mg Sublingual Q5 min PRN Gerhard Munch, MD   0.4 mg at 11/27/12 1251  . nitroGLYCERIN (NITROSTAT) SL tablet 0.4 mg  0.4 mg Sublingual Q5 min PRN Gerhard Munch, MD       Current Outpatient Prescriptions  Medication Sig Dispense Refill  . acetaminophen (TYLENOL) 325 MG tablet Take 2 tablets (650 mg total) by mouth every 4 (four) hours as needed  (or Fever >/= 101).      Marland Kitchen ALPRAZolam (XANAX) 0.25 MG tablet Take 0.25 mg by mouth 2 (two) times daily. For anxiety      . aspirin 81 MG tablet Take 81 mg by mouth daily.      Marland Kitchen atorvastatin (LIPITOR) 40 MG tablet Take 1 tablet (40 mg total) by mouth at bedtime.  90 tablet  3  . cetirizine (ZYRTEC) 10 MG tablet Take 10 mg by mouth daily.      . cyanocobalamin (,VITAMIN B-12,) 1000 MCG/ML injection Inject 1 mL (1,000 mcg total) into the muscle every 30 (thirty) days.  10 mL  3  . diphenhydrAMINE (BENADRYL ALLERGY) 25 mg capsule Take 25 mg by mouth every 6 (six) hours as needed for allergies.      . fexofenadine (ALLEGRA) 30 MG tablet Take 30 mg by mouth 2 (two) times daily.      . furosemide (LASIX) 40 MG tablet TAKE 1 TABLET BY MOUTH EVERY DAY  30 tablet  6  . lamoTRIgine (LAMICTAL) 200 MG tablet Take 200 mg by mouth daily.      Marland Kitchen levothyroxine (SYNTHROID, LEVOTHROID) 175 MCG tablet Take 175 mcg by mouth every morning.      . metFORMIN (GLUCOPHAGE) 500 MG tablet Take 0.5 tablets (250 mg total) by mouth daily with breakfast.  45 tablet  3  . NEEDLE, DISP, 25 G (MONOJECT MAGELLAN SAFETY NDL) 25G X 1" MISC by Does not apply route as directed.        . nystatin ointment (MYCOSTATIN) Apply 1 application topically daily as needed. Apply to red areas in groin      . potassium chloride (KLOR-CON 10) 10 MEQ tablet Take 2 tablets (20 mEq total) by mouth daily.  180 tablet  3  . ramipril (ALTACE) 10 MG capsule Take 1 capsule (10 mg total) by mouth daily.  90 capsule  3  . rOPINIRole (REQUIP) 1 MG tablet Take 1 mg by mouth 2 (two) times daily.       Marland Kitchen topiramate (TOPAMAX) 25 MG capsule Take 1 capsule (25 mg total) by mouth 2 (two) times daily.  180 capsule  1  . Vilazodone HCl (VIIBRYD) 40 MG TABS Take 40 mg by mouth every morning.      Marland Kitchen albuterol (PROVENTIL HFA;VENTOLIN HFA) 108 (90 BASE) MCG/ACT inhaler Inhale 2 puffs into the lungs every 6 (six) hours as needed for wheezing.  1 Inhaler  3  .  omeprazole  (PRILOSEC) 40 MG capsule Take 1 capsule (40 mg total) by mouth daily.  90 capsule  3   Physical Exam: Blood pressure 103/45, pulse 52, temperature 97.8 F (36.6 C), temperature source Oral, resp. rate 10, weight 298 lb 3 oz (135.257 kg), SpO2 97.00%.   General: Well developed, well nourished, NAD  HEENT: OP clear, mucus membranes moist  SKIN: warm, dry. No rashes.  Neuro: No focal deficits  Musculoskeletal: Muscle strength 5/5 all ext  Psychiatric: Mood and affect normal  Neck: No JVD, no carotid bruits, no thyromegaly, no lymphadenopathy.  Lungs:Clear bilaterally, no wheezes, rhonci, crackles  Cardiovascular: Regular rate and rhythm. No murmurs, gallops or rubs.  Abdomen:Soft. Bowel sounds present. Non-tender.  Extremities: No lower extremity edema. Pulses are 2 + in the bilateral DP/PT.  Labs:   Lab Results  Component Value Date   WBC 5.6 11/27/2012   HGB 14.4 11/27/2012   HCT 42.7 11/27/2012   MCV 88.4 11/27/2012   PLT 203 11/27/2012    Recent Labs Lab 11/27/12 1150  NA 142  K 3.7  CL 105  CO2 26  BUN 29*  CREATININE 0.86  CALCIUM 9.3  GLUCOSE 115*   Lab Results  Component Value Date   CKTOTAL 37 08/23/2011   CKMB 1.2 08/23/2011   TROPONINI <0.30 08/23/2011       Radiology: CXR: Central mild vascular congestion without convincing pulmonary  edema. No segmental infiltrate.  EKG: sinus, rate 61 bpm. Poor R wave progression precordial leads.   Cardiac cath 08/23/11: Left mainstem: The left main is widely patent. There is no obstructive disease present.  Left anterior descending (LAD): The left main is widely patent throughout the proximal portion. There are minor luminal irregularities present. There is a hinge point in the mid LAD with very minor nonobstructive disease present and a phasic appearance to the vessel. The first diagonal is of moderate to large caliber. Branches of the twin vessels. There is mild ostial stenosis of 30-50% present.  Left circumflex (LCx): The  left circumflex is widely patent. There is no significant obstructive disease present. There is an intermediate branch with no stenosis. There are 2 obtuse marginals with no stenosis.  Right coronary artery (RCA): This is a large, dominant vessel. There is no significant obstructive disease throughout. The vessel gives off a PDA and posterolateral branch with no stenosis.  Left ventriculography: Left ventricular systolic function is normal, LVEF is estimated at 55-65%, there is no significant mitral regurgitation    ASSESSMENT AND PLAN:   1. CAD/Chest pain: Continued chest pain. EKG without ischemic changes. Troponin negative x 2. Will admit overnight to cycle cardiac markers. NPO at midnight for stress myoview unless she rules in for MI. Continue home meds. No heparin unless cardiac markers become positive.   2. Chronic Diastolic CHF: Volume status stable. Continue outpatient meds.   3. GERD: Continue PPI.    Roel Douthat 11/27/2012, 5:28 PM

## 2012-11-27 NOTE — Telephone Encounter (Signed)
Pt currently in ER (sent per CAN).  Will await eval.  Catheterization 08/2011 with mild nonobstructive disease.  Normal LV fxn.  Can we call tomorrow for follow up?

## 2012-11-27 NOTE — ED Notes (Signed)
MD at bedside. 

## 2012-11-27 NOTE — ED Notes (Signed)
Meal tray ordered 

## 2012-11-28 ENCOUNTER — Observation Stay (HOSPITAL_COMMUNITY): Payer: BC Managed Care – PPO

## 2012-11-28 ENCOUNTER — Encounter (HOSPITAL_COMMUNITY): Payer: Self-pay | Admitting: *Deleted

## 2012-11-28 DIAGNOSIS — R079 Chest pain, unspecified: Secondary | ICD-10-CM

## 2012-11-28 LAB — BASIC METABOLIC PANEL
Calcium: 8.9 mg/dL (ref 8.4–10.5)
GFR calc Af Amer: >90 mL/min (ref >90)
GFR calc non Af Amer: 79 mL/min — ABNORMAL LOW (ref >90)
Potassium: 3.8 mEq/L (ref 3.5–5.1)
Sodium: 141 mEq/L (ref 135–145)

## 2012-11-28 LAB — MAGNESIUM: Magnesium: 1.9 mg/dL (ref 1.5–2.5)

## 2012-11-28 LAB — LIPID PANEL
Cholesterol: 151 mg/dL (ref 0–200)
Triglycerides: 118 mg/dL (ref <150)

## 2012-11-28 MED ORDER — TECHNETIUM TC 99M SESTAMIBI GENERIC - CARDIOLITE
30.0000 | Freq: Once | INTRAVENOUS | Status: AC | PRN
  Administered 2012-11-28: 30 via INTRAVENOUS

## 2012-11-28 MED ORDER — NYSTATIN 100000 UNIT/GM EX CREA
TOPICAL_CREAM | Freq: Two times a day (BID) | CUTANEOUS | Status: DC
  Administered 2012-11-28: 11:00:00 via TOPICAL
  Filled 2012-11-28: qty 15

## 2012-11-28 NOTE — Care Management Note (Unsigned)
    Page 1 of 1   11/28/2012     4:05:00 PM   CARE MANAGEMENT NOTE 11/28/2012  Patient:  [name], [name]   Account Number:  [id number]  Date Initiated:  11/28/2012  Documentation initiated by:  Jahshua Bonito  Subjective/Objective Assessment:   PT ADM ON 7/16 WITH CHEST PAIN.  PTA, PT INDEPENDENT OF ADLS.     Action/Plan:   WILL FOLLOW FOR HOME NEEDS AS PT PROGRESSES.   Anticipated DC Date:  11/29/2012   Anticipated DC Plan:  HOME/SELF CARE      DC Planning Services  CM consult      Choice offered to / List presented to:             Status of service:  In process, will continue to follow Medicare Important Message given?   (If response is "NO", the following Medicare IM given date fields will be blank) Date Medicare IM given:   Date Additional Medicare IM given:    Discharge Disposition:    Per UR Regulation:  Reviewed for med. necessity/level of care/duration of stay  If discussed at Long Length of Stay Meetings, dates discussed:    Comments:

## 2012-11-28 NOTE — Progress Notes (Signed)
Patient ID: [name], female   DOB: 1950/10/19, 62 y.o.   MRN: [id number]    Subjective:  Denies SSCP, palpitations or Dyspnea   Objective:  Filed Vitals:   11/27/12 1800 11/27/12 1900 11/27/12 2021 11/28/12 0528  BP: 114/63 101/71 118/90 95/47  Pulse: 52 51 61 53  Temp:   98.3 F (36.8 C) 97.7 F (36.5 C)  TempSrc:   Oral Oral  Resp: 13 19 18 18   Height:   5\' 6"  (1.676 m)   Weight:   300 lb 4.3 oz (136.2 kg) 302 lb 0.5 oz (137 kg)  SpO2: 98% 98% 100% 98%     Physical Exam: Affect appropriate Obese white female HEENT: normal Neck supple with no adenopathy JVP normal no bruits no thyromegaly Lungs clear with no wheezing and good diaphragmatic motion Heart:  S1/S2 no murmur, no rub, gallop or click PMI normal Abdomen: benighn, BS positve, no tenderness, no AAA no bruit.  No HSM or HJR Distal pulses intact with no bruits No edema Neuro non-focal Skin warm and dry No muscular weakness   Lab Results: Basic Metabolic Panel:  Recent Labs  40/98/11 1150 11/27/12 2330 11/28/12 0420  NA 142  --  141  K 3.7  --  3.8  CL 105  --  105  CO2 26  --  26  GLUCOSE 115*  --  123*  BUN 29*  --  23  CREATININE 0.86  --  0.80  CALCIUM 9.3  --  8.9  MG  --  1.9  --    CBC:  Recent Labs  11/27/12 1150  WBC 5.6  HGB 14.4  HCT 42.7  MCV 88.4  PLT 203   Cardiac Enzymes:  Recent Labs  11/27/12 2350  TROPONINI <0.30   Fasting Lipid Panel:  Recent Labs  11/28/12 0420  CHOL 151  HDL 53  LDLCALC 74  TRIG 118  CHOLHDL 2.8    Imaging: Dg Chest 2 View  11/27/2012   *RADIOLOGY REPORT*  Clinical Data: Chest pain  CHEST - 2 VIEW  Comparison: 08/19/2011  Findings: Cardiomediastinal silhouette is stable.  No acute infiltrate or pleural effusion.  No pulmonary edema.  Stable mild degenerative changes thoracic spine.  Central mild vascular congestion.  IMPRESSION: Central mild vascular congestion without convincing pulmonary edema.  No segmental infiltrate.    Original Report Authenticated By: Natasha Mead, M.D.    Cardiac Studies:  ECG:  NSR rate 61 poor R wave progression due to body habitus   Telemetry:  NSR no arrhythmia or VT  Echo: 08/20/11  NSR no valvular disease EF 55-60%  Medications:   . ALPRAZolam  0.25 mg Oral BID  . aspirin EC  81 mg Oral Daily  . atorvastatin  40 mg Oral QHS  . enoxaparin (LOVENOX) injection  40 mg Subcutaneous Q24H  . furosemide  40 mg Oral Daily  . lamoTRIgine  200 mg Oral Daily  . levothyroxine  175 mcg Oral q morning - 10a  . loratadine  10 mg Oral Daily  . metFORMIN  250 mg Oral Q breakfast  . pantoprazole  80 mg Oral Daily  . potassium chloride  20 mEq Oral Daily  . ramipril  10 mg Oral Daily  . regadenoson  0.4 mg Intravenous Once  . rOPINIRole  1 mg Oral BID  . sodium chloride  3 mL Intravenous Q12H  . topiramate  25 mg Oral BID  . Vilazodone HCl  40 mg Oral q morning -  10a       Assessment/Plan:  Chest Pain: Minimal disease on cath 2013  R/O No acute ECG changes.  CM ordered myovue today. She will need A 2 day study due to size Will try to look at stress only images first to avoid need for resting images DM:  BS a bit high but last A1c 5.3 with pirmary metformin not taken yesterday HTN:  Continue ACE Chol:  Continue statin  Try to d/c home if stress images normal  Charlton Haws 11/28/2012, 7:41 AM

## 2012-11-28 NOTE — Discharge Summary (Signed)
Discharge Summary   Patient ID: [name] MRN: [id number], DOB/AGE: 14-Jul-1950 62 y.o. Admit date: 11/27/2012 D/C date:     11/28/2012  Primary Cardiologist: Mariah Milling  Primary Discharge Diagnoses:  1. Chest pain, noncardiac - nonischemic stress imaging Cardiolite this admission 2. History of nonobstructive CAD by cath 2013 2. Diabetes mellitus 3. HTN 4. Hyperlipidemia 5. Hypothyroidism with abnormal TFT's - instructed to f/u PCP  Secondary Discharge Diagnoses:  1. Obesity 2. Diastolic congestive heart failure 3. GERD 4. OSA  5. Vitamin B12 deficiency 6. Anxiety  7. Depression 8. Vitamin D deficiency  9. Fibula fracture 10. Fatty liver US 2006 11. Optic neuritis 12. Restless legs syndrome 13. Arthritis 14.  Complication of anesthesia hard time waking up 02 sats 85-89% range  15. Hx of PNA  Hospital Course: [name] is a 62 y/O F with history of mild non-obstructive CAD by cath 2013, HLD, DM, obesity, diastolic CHF, HTN, GERD, OSA who presented to the ER with c/o chest pain and weakness. She was her normal state of health until the day prior to admission when she was in the grocery store and began to feel weak with some SOB. She had onset of dull right sided chest pain this am with radiation to her right jaw. The pain persisted for 12 hours. Weight has been stable and she denied LE edema or orthopnea. Her initial troponin was negative and EKG was unchanged. She was admitted for cardiac rule out. Troponins remained negative. She underwent nuclear stress testing. Initially it was felt she would require a 2-day study due to habitus, but Dr. Eden Emms looked at her stress-only images and felt they were adequate to read as non-ischemic, EF 67%. She is not having any further symptoms. Dr. Eden Emms has seen and examined her today and feels she is stable for discharge.  Of note, at discharge TSH was noted to be 38.533 and free T4 was 0.77. The patient is already on substantial-dose  synthroid prior to admission thus was instructed to contact her PCP to discuss further dose adjustment/monitoring.   Discharge Vitals: Blood pressure 99/45, pulse 59, temperature 97.6 F (36.4 C), temperature source Oral, resp. rate 18, height 5\' 6"  (1.676 m), weight 302 lb 0.5 oz (137 kg), SpO2 98.00%.  Labs: Lab Results  Component Value Date   WBC 5.6 11/27/2012   HGB 14.4 11/27/2012   HCT 42.7 11/27/2012   MCV 88.4 11/27/2012   PLT 203 11/27/2012     Recent Labs Lab 11/28/12 0420  NA 141  K 3.8  CL 105  CO2 26  BUN 23  CREATININE 0.80  CALCIUM 8.9  GLUCOSE 123*    Recent Labs  11/27/12 2350  TROPONINI <0.30   Lab Results  Component Value Date   CHOL 151 11/28/2012   HDL 53 11/28/2012   LDLCALC 74 11/28/2012   TRIG 118 11/28/2012   Diagnostic Studies/Procedures   Nuclear stress test - official results pending in Epic, but per Dr. Eden Emms, no ischemia  Dg Chest 2 View  11/27/2012   *RADIOLOGY REPORT*  Clinical Data: Chest pain  CHEST - 2 VIEW  Comparison: 08/19/2011  Findings: Cardiomediastinal silhouette is stable.  No acute infiltrate or pleural effusion.  No pulmonary edema.  Stable mild degenerative changes thoracic spine.  Central mild vascular congestion.  IMPRESSION: Central mild vascular congestion without convincing pulmonary edema.  No segmental infiltrate.   Original Report Authenticated By: Natasha Mead, M.D.    Discharge Medications     Medication  List         acetaminophen 325 MG tablet  Commonly known as:  TYLENOL  Take 2 tablets (650 mg total) by mouth every 4 (four) hours as needed (or Fever >/= 101).     albuterol 108 (90 BASE) MCG/ACT inhaler  Commonly known as:  PROVENTIL HFA;VENTOLIN HFA  Inhale 2 puffs into the lungs every 6 (six) hours as needed for wheezing.     ALPRAZolam 0.25 MG tablet  Commonly known as:  XANAX  Take 0.25 mg by mouth 2 (two) times daily. For anxiety     aspirin 81 MG tablet  Take 81 mg by mouth daily.      atorvastatin 40 MG tablet  Commonly known as:  LIPITOR  Take 1 tablet (40 mg total) by mouth at bedtime.     BENADRYL ALLERGY 25 mg capsule  Generic drug:  diphenhydrAMINE  Take 25 mg by mouth every 6 (six) hours as needed for allergies.     cetirizine 10 MG tablet  Commonly known as:  ZYRTEC  Take 10 mg by mouth daily.     cyanocobalamin 1000 MCG/ML injection  Commonly known as:  (VITAMIN B-12)  Inject 1 mL (1,000 mcg total) into the muscle every 30 (thirty) days.     fexofenadine 30 MG tablet  Commonly known as:  ALLEGRA  Take 30 mg by mouth 2 (two) times daily.     furosemide 40 MG tablet  Commonly known as:  LASIX  TAKE 1 TABLET BY MOUTH EVERY DAY     lamoTRIgine 200 MG tablet  Commonly known as:  LAMICTAL  Take 200 mg by mouth daily.     levothyroxine 175 MCG tablet  Commonly known as:  SYNTHROID, LEVOTHROID  Take 175 mcg by mouth every morning.     metFORMIN 500 MG tablet  Commonly known as:  GLUCOPHAGE  Take 0.5 tablets (250 mg total) by mouth daily with breakfast.     MONOJECT MAGELLAN SAFETY NDL 25G X 1" Misc  Generic drug:  NEEDLE (DISP) 25 G  by Does not apply route as directed.     nystatin ointment  Commonly known as:  MYCOSTATIN  Apply 1 application topically daily as needed. Apply to red areas in groin     omeprazole 40 MG capsule  Commonly known as:  PRILOSEC  Take 1 capsule (40 mg total) by mouth daily.     potassium chloride 10 MEQ tablet  Commonly known as:  KLOR-CON 10  Take 2 tablets (20 mEq total) by mouth daily.     ramipril 10 MG capsule  Commonly known as:  ALTACE  Take 1 capsule (10 mg total) by mouth daily.     rOPINIRole 1 MG tablet  Commonly known as:  REQUIP  Take 1 mg by mouth 2 (two) times daily.     topiramate 25 MG capsule  Commonly known as:  TOPAMAX  Take 1 capsule (25 mg total) by mouth 2 (two) times daily.     VIIBRYD 40 MG Tabs  Generic drug:  Vilazodone HCl  Take 40 mg by mouth every morning.         Disposition   The patient will be discharged in stable condition to home. Discharge Orders   Future Appointments Provider Department Dept Phone   11/29/2012 11:30 AM [facility] NUCLEAR MEDICINE [phone]   11/29/2012 12:30 PM [facility] 1 [facility] NUCLEAR MEDICINE [phone]   12/03/2012 8:50 AM [facility] Lab  [facility] [place] [phone]   12/10/2012 11:30 AM Eustaquio Boyden, MD [facility] [place] [phone]   01/14/2013 11:00 AM Antonieta Iba, MD [facility] [place] [phone]   11/06/2013 1:00 PM Melvyn Novas, MD [facility] [phone]   Future Orders Complete By Expires     Diet - low sodium heart healthy  As directed     Comments:      Diabetic Diet    Discharge instructions  As directed     Comments:      Please call your primary care doctor today to discuss adjustment of your thyroid medication. Your TSH was high at 38.5 and free T4 was low at 0.77.    Increase activity slowly  As directed       Follow-up Information   Follow up with Julien Nordmann, MD. (01/14/13 at 11am)    Contact information:   [facility] [address] [place] [address] [phone]       Follow up with Eustaquio Boyden, MD. (See the discharge instructions section.)    Contact information:   [address] [place] [address] [phone]         Duration of Discharge Encounter: Greater than 30 minutes including physician and PA time.  Signed, Ronie Spies PA-C 11/28/2012, 3:53 PM

## 2012-11-28 NOTE — Progress Notes (Signed)
Pt discharge instructions and education completed. IV site d/c. Site WNL. No further questions. Follow up appointments arranged. D/C home. [name]

## 2012-11-28 NOTE — Progress Notes (Addendum)
Lexiscan Cardiolite performed. 

## 2012-11-29 ENCOUNTER — Encounter (HOSPITAL_COMMUNITY): Payer: BC Managed Care – PPO

## 2012-11-29 LAB — HEMOGLOBIN A1C
Hgb A1c MFr Bld: 5.5 % (ref <5.7)
Mean Plasma Glucose: 111 mg/dL (ref <117)

## 2012-11-29 NOTE — Telephone Encounter (Signed)
Left message for patient to return my call. Patient discharged and has CPE scheduled for 12/10/12. Do you want me to change this to a hospital f/u or leave it as scheduled?

## 2012-11-30 ENCOUNTER — Other Ambulatory Visit: Payer: Self-pay | Admitting: Family Medicine

## 2012-11-30 NOTE — Telephone Encounter (Signed)
Yes let's reschedule CPE and set up hospital f/u for that time.  Thanks. Admitted for noncardiac chest pain, stress test nonischemic.

## 2012-12-02 NOTE — Telephone Encounter (Signed)
May cancel lab appt this week.

## 2012-12-02 NOTE — Telephone Encounter (Signed)
She also has labs scheduled for this week. Does she need to come in for those or just cancel that appt?

## 2012-12-02 NOTE — Telephone Encounter (Signed)
Patient notified

## 2012-12-03 ENCOUNTER — Other Ambulatory Visit: Payer: BC Managed Care – PPO

## 2012-12-03 MED ORDER — TECHNETIUM TC 99M SESTAMIBI GENERIC - CARDIOLITE
10.0000 | Freq: Once | INTRAVENOUS | Status: AC | PRN
  Administered 2012-11-28: 10 via INTRAVENOUS

## 2012-12-03 MED ORDER — TECHNETIUM TC 99M SESTAMIBI GENERIC - CARDIOLITE
30.0000 | Freq: Once | INTRAVENOUS | Status: AC | PRN
  Administered 2012-11-28: 30 via INTRAVENOUS

## 2012-12-10 ENCOUNTER — Encounter: Payer: Self-pay | Admitting: Family Medicine

## 2012-12-10 ENCOUNTER — Ambulatory Visit (INDEPENDENT_AMBULATORY_CARE_PROVIDER_SITE_OTHER): Payer: BC Managed Care – PPO | Admitting: Family Medicine

## 2012-12-10 ENCOUNTER — Other Ambulatory Visit: Payer: Self-pay | Admitting: Family Medicine

## 2012-12-10 VITALS — BP 124/78 | HR 76 | Temp 97.7°F | Wt 310.5 lb

## 2012-12-10 DIAGNOSIS — E039 Hypothyroidism, unspecified: Secondary | ICD-10-CM

## 2012-12-10 DIAGNOSIS — R079 Chest pain, unspecified: Secondary | ICD-10-CM

## 2012-12-10 DIAGNOSIS — E119 Type 2 diabetes mellitus without complications: Secondary | ICD-10-CM

## 2012-12-10 DIAGNOSIS — I5032 Chronic diastolic (congestive) heart failure: Secondary | ICD-10-CM

## 2012-12-10 DIAGNOSIS — L259 Unspecified contact dermatitis, unspecified cause: Secondary | ICD-10-CM

## 2012-12-10 LAB — T4, FREE: Free T4: 0.62 ng/dL (ref 0.60–1.60)

## 2012-12-10 LAB — TSH: TSH: 28.39 u[IU]/mL — ABNORMAL HIGH (ref 0.35–5.50)

## 2012-12-10 MED ORDER — TRIAMCINOLONE ACETONIDE 0.1 % EX CREA: TOPICAL_CREAM | Freq: Two times a day (BID) | CUTANEOUS | Status: DC

## 2012-12-10 MED ORDER — ESOMEPRAZOLE MAGNESIUM 40 MG PO CPDR: 40.0000 mg | DELAYED_RELEASE_CAPSULE | Freq: Every day | ORAL | Status: DC

## 2012-12-10 MED ORDER — LEVOTHYROXINE SODIUM 175 MCG PO TABS: 175.0000 ug | ORAL_TABLET | Freq: Every morning | ORAL | Status: DC

## 2012-12-10 NOTE — Assessment & Plan Note (Signed)
Weight gain recently attributed to dietary indiscretions, doubt fluid overload.  Continue lasix 40mg  daily. Pt motivated for weight loss.

## 2012-12-10 NOTE — Assessment & Plan Note (Signed)
Weight gain again noted - encouraged her to work on healthy diet and lifestyle changes as prior. Pt motivated.

## 2012-12-10 NOTE — Assessment & Plan Note (Signed)
Anticipate seborrheic dermatitis - treat with TCI cream. If worsening or not responsive, consider referral to derm.

## 2012-12-10 NOTE — Assessment & Plan Note (Signed)
Deemed noncardiac by recent inpatient workup with normal stress cardiolite Not reproducible points against MSK.  ?GERD related - trial of nexium 40mg  daily instead of omeprazole or next few weeks to see if any improvement. Consider esoph spasm if no improvement.

## 2012-12-10 NOTE — Assessment & Plan Note (Signed)
Chronic, stable. Actually significant improvement on latest A1c.  Will continue to monitor - consider trial off metformin in future.  No hypoglycemia.

## 2012-12-10 NOTE — Progress Notes (Signed)
Subjective:    Patient ID: [name], female    DOB: 1950/08/22, 62 y.o.   MRN: [id number]  HPI CC: hospital f/u  Received medicare 01/2012.  [name] presents today as hospital f/u for recent hospitalization for chest pain.  Records reviewed.  Work up revealing likely noncardiac chest pain - with nonischemic stress cardiolite. She was also found to have low thyroid with TSH of 38.5 and free T4 low at 0.77.  Persistent chest discomfort that comes and goes.  Describes R sided chest pain (dull ache) that travels to inferior shoulder blade (occasionally stabbing), responded to nitroglycerin and pain medication in ER.  Does tend to improve after BM.  No dyspnea, nausea, vomiting, coughing, GERD.  Not food related.  Not reproducible.  On omepraozle 40mg  for last several years for GERD.  Noted significant epistaxis and nasal congestion for last 5-6 months.  Worse with CPAP.  To see Dr. Gary Fleet for this.  Notices fragrances affecting her more recently.  Significant weight gain noted - has decided to restart herbalife and to start new weight challenge this Thursday. Wt Readings from Last 3 Encounters:  12/10/12 310 lb 8 oz (140.842 kg)  11/28/12 302 lb 0.5 oz (137 kg)  11/06/12 298 lb (135.172 kg)   Lab Results  Component Value Date   HGBA1C 5.5 11/27/2012  DM - last few A1c values have been very good for her.  Compliant with metformin 250mg  daily.  Primary Discharge Diagnoses:  1. Chest pain, noncardiac  - nonischemic stress imaging Cardiolite this admission  2. History of nonobstructive CAD by cath 2013  2. Diabetes mellitus  3. HTN  4. Hyperlipidemia  5. Hypothyroidism with abnormal TFT's - instructed to f/u PCP  Secondary Discharge Diagnoses:  1. Obesity  2. Diastolic congestive heart failure  3. GERD  4. OSA  5. Vitamin B12 deficiency  6. Anxiety  7. Depression  8. Vitamin D deficiency  9. Fibula fracture  10. Fatty liver US 2006  11. Optic neuritis  12. Restless legs  syndrome  13. Arthritis  14. Complication of anesthesia hard time waking up 02 sats 85-89% range  15. Hx of PNA  Hospital Course: [name] is a 62 y/O F with history of mild non-obstructive CAD by cath 2013, HLD, DM, obesity, diastolic CHF, HTN, GERD, OSA who presented to the ER with c/o chest pain and weakness. She was her normal state of health until the day prior to admission when she was in the grocery store and began to feel weak with some SOB. She had onset of dull right sided chest pain this am with radiation to her right jaw. The pain persisted for 12 hours. Weight has been stable and she denied LE edema or orthopnea. Her initial troponin was negative and EKG was unchanged. She was admitted for cardiac rule out. Troponins remained negative. She underwent nuclear stress testing. Initially it was felt she would require a 2-day study due to habitus, but Dr. Eden Emms looked at her stress-only images and felt they were adequate to read as non-ischemic, EF 67%. She is not having any further symptoms. Dr. Eden Emms has seen and examined her today and feels she is stable for discharge.  Of note, at discharge TSH was noted to be 38.533 and free T4 was 0.77. The patient is already on substantial-dose synthroid prior to admission thus was instructed to contact her PCP to discuss further dose adjustment/monitoring.   Past Medical History  Diagnosis Date  . Vitamin B 12  deficiency   . Anxiety   . Depression   . Hyperlipidemia   . Hypothyroid   . CAD (coronary artery disease)     a. nonobstructive plaque by cath 2013.  . Obesity     morbid, seen dietician 09/2011  . T2DM (type 2 diabetes mellitus)   . Vitamin D deficiency     last check 47 (11/2010)  . Fibula fracture   . Fatty liver 2006    [place]  . 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 Diastolic CHF 08/2011    EF 55-60%, nl valves  . RLS (restless legs syndrome)   . Arthritis   . GERD (gastroesophageal reflux disease)   .  Hypertension   . Complication of anesthesia     hard time waking up 02 sats 85-89% range   . OSA (obstructive sleep apnea)     medium quattro full face mask CPAP 10cm  . Pneumonia     hx of pneumonia      Review of Systems Per HPI    Objective:   Physical Exam  Nursing note and vitals reviewed. Constitutional: She appears well-developed and well-nourished. No distress.  HENT:  Head: Normocephalic and atraumatic.  Mouth/Throat: Oropharynx is clear and moist. No oropharyngeal exudate.  Cardiovascular: Normal rate, regular rhythm, normal heart sounds and intact distal pulses.   No murmur heard. Pulmonary/Chest: Effort normal and breath sounds normal. No respiratory distress. She has no wheezes. She has no rales.  Musculoskeletal: She exhibits edema (tr pedal edema).  Skin: Skin is warm and dry. Rash noted.  External pinnae - scaling rash       Assessment & Plan:

## 2012-12-10 NOTE — Patient Instructions (Addendum)
We will recheck your thyroid today - blood work.  We will call you with results of thyroid. Topical steroid for ears. Return in 2-3  months for physical. Good to see you today, call us with questions. Change prilosec to nexium - samples provided and i've sent in 1 month supply of nexium to your pharmacy.  If no better, let me know

## 2012-12-10 NOTE — Assessment & Plan Note (Signed)
Recently found to be hypothyroid - may be related to recent weight gain, or may be sick euthyroid while in hospital - recheck today. Lab Results  Component Value Date   TSH 38.533* 11/28/2012

## 2013-01-08 ENCOUNTER — Other Ambulatory Visit: Payer: Self-pay | Admitting: Family Medicine

## 2013-01-14 ENCOUNTER — Ambulatory Visit: Payer: BC Managed Care – PPO | Admitting: Cardiovascular Disease

## 2013-01-14 ENCOUNTER — Encounter: Payer: Self-pay | Admitting: *Deleted

## 2013-01-16 ENCOUNTER — Other Ambulatory Visit (INDEPENDENT_AMBULATORY_CARE_PROVIDER_SITE_OTHER): Payer: BC Managed Care – PPO

## 2013-01-16 DIAGNOSIS — E039 Hypothyroidism, unspecified: Secondary | ICD-10-CM

## 2013-01-16 LAB — TSH: TSH: 17.28 u[IU]/mL — ABNORMAL HIGH (ref 0.35–5.50)

## 2013-01-17 ENCOUNTER — Other Ambulatory Visit: Payer: Self-pay | Admitting: Family Medicine

## 2013-01-17 DIAGNOSIS — E039 Hypothyroidism, unspecified: Secondary | ICD-10-CM

## 2013-01-17 MED ORDER — LEVOTHYROXINE SODIUM 200 MCG PO TABS: 200.0000 ug | ORAL_TABLET | Freq: Every day | ORAL | Status: DC

## 2013-01-18 ENCOUNTER — Encounter: Payer: Self-pay | Admitting: Cardiovascular Disease

## 2013-02-14 ENCOUNTER — Ambulatory Visit: Payer: BC Managed Care – PPO | Admitting: Cardiovascular Disease

## 2013-02-26 ENCOUNTER — Encounter: Payer: Self-pay | Admitting: Cardiovascular Disease

## 2013-02-26 ENCOUNTER — Ambulatory Visit (INDEPENDENT_AMBULATORY_CARE_PROVIDER_SITE_OTHER): Payer: Medicare Other | Admitting: Cardiovascular Disease

## 2013-02-26 VITALS — BP 128/80 | HR 66 | Ht 66.0 in | Wt 314.8 lb

## 2013-02-26 DIAGNOSIS — E785 Hyperlipidemia, unspecified: Secondary | ICD-10-CM

## 2013-02-26 DIAGNOSIS — E119 Type 2 diabetes mellitus without complications: Secondary | ICD-10-CM

## 2013-02-26 DIAGNOSIS — I2510 Atherosclerotic heart disease of native coronary artery without angina pectoris: Secondary | ICD-10-CM

## 2013-02-26 DIAGNOSIS — R0789 Other chest pain: Secondary | ICD-10-CM

## 2013-02-26 DIAGNOSIS — I5032 Chronic diastolic (congestive) heart failure: Secondary | ICD-10-CM

## 2013-02-26 NOTE — Assessment & Plan Note (Signed)
Cholesterol is at goal on the current lipid regimen. No changes to the medications were made.  

## 2013-02-26 NOTE — Assessment & Plan Note (Signed)
Appears relatively euvolemic on today's visit. Encouraged her to continue Lasix daily. Unable to exclude mild venous insufficiency as a cause of her leg edema. Encouraged her to wear compression hose during the daytime. She should limit her salt and fluid intake. Her weight and sleep related disorder is likely responsible for underlying diastolic CHF.

## 2013-02-26 NOTE — Assessment & Plan Note (Signed)
No further episodes of chest pain. No further workup at this time 

## 2013-02-26 NOTE — Progress Notes (Signed)
Patient ID: [name], female    DOB: 07/16/1950, 62 y.o.   MRN: [id number]  HPI Comments: Ms Abdulla is a very pleasant 62 year old woman with history of obesity, hyperlipidemia, hypothyroidism, diabetes who presents for routine followup . She was evaluated in the hospital July 2014 for chest pain. Cardiac enzymes negative, stress test also showed no ischemia. Previous episode of diastolic CHF. Prior echocardiogram in 2013 showing normal ejection fraction.  She denies any significant chest pain since that time. She does report having restless leg syndrome. Also has lower extremity edema for which she takes diuretics.  She is taking Lasix 40 mg daily.  She has sleep apnea and wears CPAP.  She continues to struggle with her weight. Minimal edema on today's visit. Recent TSH of 17, total cholesterol 151, LDL 74  EKG shows normal sinus rhythm with rate 86 beats per minute, no significant ST or T wave changes    Outpatient Encounter Prescriptions as of 02/26/2013  Medication Sig Dispense Refill  . acetaminophen (TYLENOL) 325 MG tablet Take 2 tablets (650 mg total) by mouth every 4 (four) hours as needed (or Fever >/= 101).      Marland Kitchen ALPRAZolam (XANAX) 0.25 MG tablet Take 0.25 mg by mouth 2 (two) times daily. For anxiety      . aspirin 81 MG tablet Take 81 mg by mouth daily.      Marland Kitchen atorvastatin (LIPITOR) 40 MG tablet Take 1 tablet (40 mg total) by mouth at bedtime.  90 tablet  3  . cyanocobalamin (,VITAMIN B-12,) 1000 MCG/ML injection Inject 1 mL (1,000 mcg total) into the muscle every 30 (thirty) days.  10 mL  3  . esomeprazole (NEXIUM) 40 MG capsule Take 1 capsule (40 mg total) by mouth daily.  30 capsule  2  . furosemide (LASIX) 40 MG tablet TAKE 1 TABLET BY MOUTH EVERY DAY  30 tablet  6  . lamoTRIgine (LAMICTAL) 200 MG tablet Take 200 mg by mouth daily.      Marland Kitchen levothyroxine (SYNTHROID) 200 MCG tablet Take 1 tablet (200 mcg total) by mouth daily before breakfast.  90 tablet  3  .  metFORMIN (GLUCOPHAGE) 500 MG tablet Take 0.5 tablets (250 mg total) by mouth daily with breakfast.  45 tablet  3  . NEEDLE, DISP, 25 G (MONOJECT MAGELLAN SAFETY NDL) 25G X 1" MISC by Does not apply route as directed.        . nystatin ointment (MYCOSTATIN) Apply 1 application topically daily as needed. Apply to red areas in groin      . omeprazole (PRILOSEC) 40 MG capsule Take 1 capsule (40 mg total) by mouth daily.  90 capsule  3  . potassium chloride (KLOR-CON 10) 10 MEQ tablet Take 2 tablets (20 mEq total) by mouth daily.  180 tablet  3  . ramipril (ALTACE) 10 MG capsule Take 1 capsule (10 mg total) by mouth daily.  90 capsule  3  . rOPINIRole (REQUIP) 1 MG tablet Take one  tablets twice a day.      . topiramate (TOPAMAX) 25 MG capsule Take 1 capsule (25 mg total) by mouth 2 (two) times daily.  180 capsule  1  . Vilazodone HCl (VIIBRYD) 40 MG TABS Take 40 mg by mouth every morning.        Review of Systems  Constitutional: Negative.   HENT: Negative.   Eyes: Negative.   Respiratory: Negative.   Cardiovascular: Negative.   Gastrointestinal: Negative.   Endocrine: Negative.  Musculoskeletal: Negative.   Skin: Negative.   Allergic/Immunologic: Negative.   Neurological: Negative.   Hematological: Negative.   Psychiatric/Behavioral: Negative.   All other systems reviewed and are negative.    BP 128/80  Pulse 66  Ht 5\' 6"  (1.676 m)  Wt 314 lb 12 oz (142.77 kg)  BMI 50.83 kg/m2  Physical Exam  Nursing note and vitals reviewed. Constitutional: She is oriented to person, place, and time. She appears well-developed and well-nourished.  HENT:  Head: Normocephalic.  Nose: Nose normal.  Mouth/Throat: Oropharynx is clear and moist.  Eyes: Conjunctivae are normal. Pupils are equal, round, and reactive to light.  Neck: Normal range of motion. Neck supple. No JVD present.  Cardiovascular: Normal rate, regular rhythm, S1 normal, S2 normal, normal heart sounds and intact distal pulses.   Exam reveals no gallop and no friction rub.   No murmur heard. Pulmonary/Chest: Effort normal and breath sounds normal. No respiratory distress. She has no wheezes. She has no rales. She exhibits no tenderness.  Abdominal: Soft. Bowel sounds are normal. She exhibits no distension. There is no tenderness.  Musculoskeletal: Normal range of motion. She exhibits no edema and no tenderness.  Lymphadenopathy:    She has no cervical adenopathy.  Neurological: She is alert and oriented to person, place, and time. Coordination normal.  Skin: Skin is warm and dry. No rash noted. No erythema.  Psychiatric: She has a normal mood and affect. Her behavior is normal. Judgment and thought content normal.    Assessment and Plan

## 2013-02-26 NOTE — Assessment & Plan Note (Signed)
We have encouraged continued exercise, careful diet management in an effort to lose weight. 

## 2013-02-26 NOTE — Patient Instructions (Signed)
You are doing well. No medication changes were made.  Please call us if you have new issues that need to be addressed before your next appt.  Your physician wants you to follow-up in: 12 months.  You will receive a reminder letter in the mail two months in advance. If you don't receive a letter, please call our office to schedule the follow-up appointment. 

## 2013-02-28 ENCOUNTER — Other Ambulatory Visit: Payer: BC Managed Care – PPO

## 2013-02-28 ENCOUNTER — Encounter: Payer: Self-pay | Admitting: Family Medicine

## 2013-03-04 ENCOUNTER — Encounter: Payer: Self-pay | Admitting: Family Medicine

## 2013-03-05 ENCOUNTER — Encounter: Payer: Self-pay | Admitting: Family Medicine

## 2013-03-06 ENCOUNTER — Other Ambulatory Visit: Payer: Self-pay | Admitting: Family Medicine

## 2013-03-06 ENCOUNTER — Encounter: Payer: Self-pay | Admitting: Family Medicine

## 2013-03-06 NOTE — Telephone Encounter (Signed)
plz open phone note or refill request for [name] and send to PCP or available provider if she's not available today.

## 2013-03-06 NOTE — Telephone Encounter (Signed)
See Mychart message. Patient has increase requip on her own and needs refill. Pharmacy won't okay it.

## 2013-03-09 MED ORDER — ROPINIROLE HCL 2 MG PO TABS: 2.0000 mg | ORAL_TABLET | Freq: Every day | ORAL | Status: DC

## 2013-03-11 ENCOUNTER — Encounter: Payer: BC Managed Care – PPO | Admitting: Family Medicine

## 2013-03-20 ENCOUNTER — Other Ambulatory Visit: Payer: Self-pay

## 2013-04-14 ENCOUNTER — Other Ambulatory Visit (INDEPENDENT_AMBULATORY_CARE_PROVIDER_SITE_OTHER): Payer: Medicare Other

## 2013-04-14 DIAGNOSIS — Z23 Encounter for immunization: Secondary | ICD-10-CM

## 2013-04-14 DIAGNOSIS — E039 Hypothyroidism, unspecified: Secondary | ICD-10-CM

## 2013-04-15 ENCOUNTER — Other Ambulatory Visit: Payer: Self-pay | Admitting: Family Medicine

## 2013-04-15 ENCOUNTER — Ambulatory Visit (INDEPENDENT_AMBULATORY_CARE_PROVIDER_SITE_OTHER): Payer: Medicare Other | Admitting: Family Medicine

## 2013-04-15 ENCOUNTER — Encounter: Payer: Self-pay | Admitting: Family Medicine

## 2013-04-15 VITALS — BP 124/76 | HR 76 | Temp 98.8°F | Wt 324.0 lb

## 2013-04-15 DIAGNOSIS — E039 Hypothyroidism, unspecified: Secondary | ICD-10-CM

## 2013-04-15 DIAGNOSIS — M545 Low back pain, unspecified: Secondary | ICD-10-CM

## 2013-04-15 DIAGNOSIS — E119 Type 2 diabetes mellitus without complications: Secondary | ICD-10-CM

## 2013-04-15 LAB — HEMOGLOBIN A1C: Hgb A1c MFr Bld: 6.5 % (ref 4.6–6.5)

## 2013-04-15 MED ORDER — METFORMIN HCL 500 MG PO TABS: 500.0000 mg | ORAL_TABLET | Freq: Every day | ORAL | Status: DC

## 2013-04-15 MED ORDER — METFORMIN HCL 500 MG PO TABS: 250.0000 mg | ORAL_TABLET | Freq: Every day | ORAL | Status: DC

## 2013-04-15 MED ORDER — LEVOTHYROXINE SODIUM 112 MCG PO TABS: 224.0000 ug | ORAL_TABLET | Freq: Every day | ORAL | Status: DC

## 2013-04-15 MED ORDER — OMEPRAZOLE 40 MG PO CPDR: 40.0000 mg | DELAYED_RELEASE_CAPSULE | Freq: Every day | ORAL | Status: DC

## 2013-04-15 MED ORDER — NAPROXEN 500 MG PO TABS: ORAL_TABLET | ORAL | Status: DC

## 2013-04-15 NOTE — Progress Notes (Signed)
   Subjective:    Patient ID: [name], female    DOB: 09/28/1950, 62 y.o.   MRN: [id number]  HPI CC: Left hip pain radiating into knee and behind leg.   Bilateral lower back pain with associated left hip and left knee pain for last 2 weeks. Pain is described as shooting pains down lower thigh and stops at knee. Pain worsens with flexion of knee. No pain into lower leg or foot. Complains of swelling on medial aspect of left knee. Advil, Aleve, tylenol provide temporary relief. No relief with ice or heat. Hx of fall 4 weeks ago after tripping over dog leash with fall onto hands on knees.Also complains of heel pain with increased walking or standing (for several weeks). Supportive rubber crock footware helps significantly. Leaving for [facility] on Sunday.   Denies numbness, tingling, weakness of left extremity.   Hx of occasional arthritic knee pain that is usually relieved with Aleve. Arthroscopic knee surgery approximately 6 years ago.  Medication question: prescribed plain metformin but pharmacy is giving her combo with Glyburide.   States is taking thyroid medication regularly.   Review of Systems Per HPI    Objective:   Physical Exam Left Hip: Full ROM, 5/5 strength, no tenderness to palpation of hip joint. Negative straight leg test. Negative faber test.   Left Knee: Full ROM, 5/5 strength, no tenderness to palpation. Negative Lachmans, anterior and posterior drawer, valgus, varus, and McMurrarys.  Sensation intact to lower leg and foot.  No midline spine tenderness. Tenderness to palpation at left sciatic notch.   Tenderness to palpation at heel of left foot. Sensation intact. Full ROM of ankle and foot.      Assessment & Plan:  Suspect sciatica and knee strain (likely from recent fall).  Daily Naproxen once daily for 5 days then as needed for pain. If not better, let us know and will prescribe Tramadol for trip.  Stretching exercises provided.   Encouraged supportive  footwear and heel cushion for relief of heel pain.  Thyroid function is very low. Will recheck today. Check TSH, free T4, A1C. We will call you with results.  Lets stop combo metformin/glyburide. Will send in prescription for plain Metformin 250mg . Stop Nexium and start Omeprazole 40 mg once daily (but take separately from thyroid medicine).  Continued to encourage weight loss.

## 2013-04-15 NOTE — Progress Notes (Signed)
Patient seen and examined with PA student Ricarda Frame.  Note reviewed, agree with assessment and plan unless changes documented in my note.   CC: back, left hip and knee pain  Several week h/o bilateral lower back pain associated with left lower hip and knee pain.  Some radiation of pain from lower back to knee worse with prolonged standing or walking.  Some swelling of medial left knee.  Also with heel pain.  Trouble with flexion of knee last week but now that has improved. H/o fall 4 wks ago, fell onto hands and knees, tripped over dog leash. About to go on trip to [facility] this Sunday, wanted eval prior to trip. Denies weakness of legs, numbness or tingling of legs.  No knee instability H/o L knee arthroscopy (meniscectomy) 4 years ago. So far has tried advil, aleve, tylenol without significant improvement.  Also asks question about metformin vs glucovance (has been taking glcuovance although I thought she was on metformin). Doesn't check sugars. On thyroid replacement daily for last 6 weeks.  Denies missed doses.  Recent TSH yesterday returned elevated at 115  Wt Readings from Last 3 Encounters:  04/15/13 324 lb (146.965 kg)  02/26/13 314 lb 12 oz (142.77 kg)  12/10/12 310 lb 8 oz (140.842 kg)    Past Medical History  Diagnosis Date  . Vitamin B 12 deficiency   . Anxiety   . Depression   . Hyperlipidemia   . Hypothyroid   . CAD (coronary artery disease)     nonobstructive plaque by cath 2013  . Obesity     morbid, seen dietician 09/2011  . T2DM (type 2 diabetes mellitus)   . Vitamin D deficiency     last check 47 (11/2010)  . Fibula fracture   . Fatty liver 2006    [place]  . 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 Diastolic CHF 08/2011    EF 55-60%, nl valves  . RLS (restless legs syndrome)   . Arthritis   . GERD (gastroesophageal reflux disease)   . Hypertension   . Complication of anesthesia     hard time waking up 02 sats 85-89% range   . OSA  (obstructive sleep apnea)     medium quattro full face mask CPAP 10cm  . Pneumonia     hx of pneumonia   . 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 Osteotomy and ulnar shortening  2001    [name]  . Knee surgery  11-02-2008    Partial medical and Lat Menisectomies and Condroplasty (Dr. Thurston Hole)  . Cardiac catheterization  10/12/09    EF 70%  . Mr abdomen  01/16/2011    Left 4cm adrenal myelolipoma  . Cardiac catheterization  08/23/2011    Mild nonobstructive CAD, normal LV fxn  . US echocardiography  08/2011    EF of 55-60% and indeterminate diastolic function  . Esophagogastroduodenoscopy  2006  . Ventral hernia repair  03/01/2012    Procedure: LAPAROSCOPIC VENTRAL HERNIA;  Surgeon: Axel Filler, MD;  Location: [facility];  Service: General;  Laterality: N/A;    PE:  GEN: morbidly obese, CF, NAD MSK: no midline tenderness of lumbar spine but tender at left sciatic notch. Neg SLR bilaterally.  No significant pain at GTB on left.  No left hip pain with int/ext rotation of hip.  L knee without effusion/swelling.  Some bruising  anterior knee.  Tender to palpation anterior knee.  Neg drawer test, no significant pain with mcmurrays, no PFgrind, no abnormal patellar mobility, no pain with valgus/varus testing. FROM at right knee

## 2013-04-15 NOTE — Assessment & Plan Note (Signed)
Very well controlled previously - has been on glucovance 250/1.25mg  We suggested switch to metformin 250mg  daily given prior A1c 5.5% however in setting of recent weight gain, will need to monitor sugars closely.

## 2013-04-15 NOTE — Progress Notes (Signed)
Pre-visit discussion using our clinic review tool. No additional management support is needed unless otherwise documented below in the visit note.  

## 2013-04-15 NOTE — Assessment & Plan Note (Signed)
Persistently elevated TSH despite reported compliance with daily - in setting of noted weight gain.   Will recheck TSH today - and if persistently elevated, will increase levothyroxine dose.

## 2013-04-15 NOTE — Assessment & Plan Note (Signed)
Anticipate left sided sciatica leading to discomfort endorsed today - along with left knee sprain after fall. Treat with heat, rest, naprosyn 500mg  daily for 5 days then prn. Provided with stretching exercises for sciatica from [facility] pt advisor. If this doesn't control sxs, recommend start tramadol for pain for upcoming trip. Pt agrees with plan.

## 2013-04-15 NOTE — Patient Instructions (Addendum)
Let's stop glucovance and start plain metformin at 1/2 tablet daily. Blood work today - we will call you with results of this and plan as far as thyroid medication. Let's use naprosyn once daily for 5 days then as needed for pain. I do think you have knee strain and sciatica - do stretching exercises provided. If not better, let me know for tramadol prescription.

## 2013-04-17 ENCOUNTER — Encounter: Payer: Self-pay | Admitting: Family Medicine

## 2013-04-18 ENCOUNTER — Telehealth: Payer: Self-pay

## 2013-04-18 MED ORDER — TRAMADOL HCL 50 MG PO TABS: 50.0000 mg | ORAL_TABLET | Freq: Two times a day (BID) | ORAL | Status: DC | PRN

## 2013-04-18 NOTE — Telephone Encounter (Signed)
Pt wants to make sure understands thyroid dosing. Pt was taking levothyroxine 200 mcg one daily; pt is to pick up at pharmacy today levothyroxine 112 mcg taking 2 tabs daily. Is that correct?  Pt said naproxen is not helping knee pain and swelling. Pt is to leave 04/19/13 for [facility] and wants different med to take place of naproxen discussed at last visit to [facility].Please advise.

## 2013-04-18 NOTE — Telephone Encounter (Signed)
Spoke with patient and verified thyroid dose. Patient would also like the Tramadol because the naprosyn has not been much help. With her upcoming trip she feels that she will need it. Also she said her fluid is up a little today. She is up 6 lbs and feels a little SOB. She wants to see if it's ok to take an extra lasix.

## 2013-04-18 NOTE — Telephone Encounter (Signed)
Yes take 2 pills of daily. plz phone in tramadol.

## 2013-04-18 NOTE — Telephone Encounter (Signed)
Rx called in as directed. Have already spoken with patient about levothyroxine.

## 2013-04-20 NOTE — Telephone Encounter (Signed)
i believe tramadol was already phoned in.  Ok to take lasix extra dose and notify if weight continues increasing.

## 2013-04-21 ENCOUNTER — Encounter: Payer: Medicare Other | Admitting: Family Medicine

## 2013-04-29 ENCOUNTER — Encounter: Payer: Medicare Other | Admitting: Family Medicine

## 2013-05-05 ENCOUNTER — Other Ambulatory Visit: Payer: Medicare Other

## 2013-05-06 ENCOUNTER — Other Ambulatory Visit: Payer: Self-pay | Admitting: Family Medicine

## 2013-05-12 ENCOUNTER — Encounter: Payer: Self-pay | Admitting: Family Medicine

## 2013-05-12 ENCOUNTER — Ambulatory Visit (INDEPENDENT_AMBULATORY_CARE_PROVIDER_SITE_OTHER): Payer: Medicare Other | Admitting: Family Medicine

## 2013-05-12 VITALS — BP 118/76 | HR 72 | Temp 98.1°F | Ht 66.5 in | Wt 329.5 lb

## 2013-05-12 DIAGNOSIS — E785 Hyperlipidemia, unspecified: Secondary | ICD-10-CM

## 2013-05-12 DIAGNOSIS — Z1211 Encounter for screening for malignant neoplasm of colon: Secondary | ICD-10-CM

## 2013-05-12 DIAGNOSIS — E039 Hypothyroidism, unspecified: Secondary | ICD-10-CM

## 2013-05-12 DIAGNOSIS — F329 Major depressive disorder, single episode, unspecified: Secondary | ICD-10-CM

## 2013-05-12 DIAGNOSIS — E119 Type 2 diabetes mellitus without complications: Secondary | ICD-10-CM

## 2013-05-12 DIAGNOSIS — F3289 Other specified depressive episodes: Secondary | ICD-10-CM

## 2013-05-12 DIAGNOSIS — H60509 Unspecified acute noninfective otitis externa, unspecified ear: Secondary | ICD-10-CM

## 2013-05-12 DIAGNOSIS — H60543 Acute eczematoid otitis externa, bilateral: Secondary | ICD-10-CM

## 2013-05-12 DIAGNOSIS — Z0000 Encounter for general adult medical examination without abnormal findings: Secondary | ICD-10-CM

## 2013-05-12 DIAGNOSIS — M25562 Pain in left knee: Secondary | ICD-10-CM | POA: Insufficient documentation

## 2013-05-12 DIAGNOSIS — M25569 Pain in unspecified knee: Secondary | ICD-10-CM

## 2013-05-12 MED ORDER — TRIAMCINOLONE ACETONIDE 0.1 % EX CREA: 1.0000 "application " | TOPICAL_CREAM | Freq: Two times a day (BID) | CUTANEOUS | Status: DC

## 2013-05-12 NOTE — Assessment & Plan Note (Signed)
Treat with TCI cream - update if sxs persist or worsen

## 2013-05-12 NOTE — Patient Instructions (Addendum)
Call to schedule mammogram and well woman exam with Dr. Vincente Poli Call your insurace about the shingles shot to see if it is covered or how much it would cost and where is cheaper (here or pharmacy).  If you want to receive here, call for nurse visit. Decrease aspirin to every other day Stool kit today Bring me a copy of your living will or advanced directive For left knee - I think you have pes anserine bursitis - may use naprosyn for inflammation and do stretching exercises provided today.  Let me know if persistent pain.

## 2013-05-12 NOTE — Progress Notes (Signed)
Subjective:    Patient ID: [name], female    DOB: [birth year], 62 y.o.   MRN: [id number]  HPI CC: medicare wellness visit - initial  Did have EKG done 02/2013 at hospital, WNL Medicare started 01/2013  Limited mobility 2/2 L knee and hip pain.  Ongoing knee pain for last 4 weeks.  She did have a fall onto both knees 2 months ago.  Has noted weight gain worsens pain.  Not mobile anymore 2/2 weight gain and pain.  Endorses persistent swelling as well. Prior thought had strained left knee, treated with naprosyn then tramadol.  Never filled tramadol.  Taking naprosyn prn. H/o L knee arthroscopy (meniscectomy) 4 years ago.  Wt Readings from Last 3 Encounters:  05/12/13 329 lb 8 oz (149.46 kg)  04/15/13 324 lb (146.965 kg)  02/26/13 314 lb 12 oz (142.77 kg)   Body mass index is 52.39 kg/(m^2).  Caffeine: 1 cup coffee/day  Lives with daughter  [facility] Diet: watching diet, low sodium. Good water. Fruits/vegetables daily.   Passes hearing screen today. Vision screen deferred as recent ophtho exam 1 fall in last year - mechanical when she tripped over dog's leash Stable depression on meds currently per psych.  Preventative:  Well woman with OBGYN - normal pap and breast/mammo in past, due for f/u as last visit was 2 yrs ago Vincente Poli). Last mammogram - 2012, normal. Due for rpt Has never had colonoscopy - states cannot tolerate prep. Has even not tolerated gatorade/miralax prep in past. Bad gag reflex. Requests stool kit. Tdap 2013 Pneumovax 02/2011  Flu shot 04/2013 zostavax - will check with insurance Advanced directive: has at home.  Thinks daughter Laney Pastor would be [facility] proxy.  Medications and allergies reviewed and updated in chart.  Past histories reviewed and updated if relevant as below. Patient Active Problem List   Diagnosis Date Noted  . Left low back pain 04/15/2013  . Non-cardiac chest pain 11/27/2012  . Optic  neuritis, unspecified 11/06/2012  . Healthcare maintenance 12/01/2011  . RLS (restless legs syndrome)   . Chronic diastolic heart failure 09/04/2011  . Dyspnea 08/31/2011  . Coronary artery disease 08/24/2011  . Intermediate coronary syndrome 08/22/2011  . Atypical chest pain-non obstr cath 08/2011 08/20/2011  . History of pneumonia 10/13/2010  . INSOMNIA 07/26/2010  . CAD, NATIVE VESSEL 11/17/2009  . OBSTRUCTIVE SLEEP APNEA 11/08/2009  . Unspecified vitamin D deficiency 07/15/2009  . HYPERLIPIDEMIA-MIXED 06/17/2009  . DIABETES MELLITUS, TYPE II 05/21/2009  . OBESITY, MORBID 05/21/2009  . Dermatitis of external ear 01/13/2009  . HYPOTHYROIDISM 11/12/2006  . B12 DEFICIENCY 11/12/2006  . DEPRESSION 11/12/2006   Past Medical History  Diagnosis Date  . Vitamin B 12 deficiency   . Anxiety   . Depression   . Hyperlipidemia   . Hypothyroid   . CAD (coronary artery disease)     nonobstructive plaque by cath 2013  . Obesity     morbid, seen dietician 09/2011  . T2DM (type 2 diabetes mellitus)   . Vitamin D deficiency     last check 47 (11/2010)  . Fibula fracture   . Fatty liver 2006    [place]  . 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 Diastolic CHF 08/2011    EF 55-60%, nl valves  . RLS (restless legs syndrome)   . Arthritis   . GERD (gastroesophageal reflux disease)   . Hypertension   . Complication  of anesthesia     hard time waking up 02 sats 85-89% range   . OSA (obstructive sleep apnea)     medium quattro full face mask CPAP 10cm  . Pneumonia     hx of pneumonia   . Myelolipoma 10/2010    stable L adrenal myelolipoma   Past Surgical History  Procedure Laterality Date  . Osteotomy and ulnar shortening  2001    [name]  . Knee surgery Left 11-02-2008    Partial medial and lateral Menisectomies and Condroplasty (Dr. Thurston Hole)  . Cardiac catheterization  10/12/09    EF 70%  . Mr abdomen  01/16/2011    Left 4cm adrenal myelolipoma  . Cardiac catheterization   08/23/2011    Mild nonobstructive CAD, normal LV fxn  . US echocardiography  08/2011    EF of 55-60% and indeterminate diastolic function  . Esophagogastroduodenoscopy  2006  . Ventral hernia repair  03/01/2012    Procedure: LAPAROSCOPIC VENTRAL HERNIA;  Surgeon: Axel Filler, MD;  Location: [facility];  Service: General;  Laterality: N/A;   History  Substance Use Topics  . Smoking status: Former Smoker    Quit date: 08/08/1997  . Smokeless tobacco: Never Used  . Alcohol Use: No   Family History  Problem Relation Age of Onset  . Coronary artery disease Mother 18    14 angioplasty and 5v CABG  . Stroke Mother   . Diabetes Mother   . Heart attack Father 86  . Heart attack Brother 23    deceased age 69   Allergies  Allergen Reactions  . Erythromycin Base Nausea And Vomiting  . Neomycin     vomiting    Current Outpatient Prescriptions on File Prior to Visit  Medication Sig Dispense Refill  . acetaminophen (TYLENOL) 325 MG tablet Take 2 tablets (650 mg total) by mouth every 4 (four) hours as needed (or Fever >/= 101).      Marland Kitchen ALPRAZolam (XANAX) 0.25 MG tablet Take 0.25 mg by mouth daily. For anxiety      . aspirin 81 MG tablet Take 81 mg by mouth daily.      Marland Kitchen atorvastatin (LIPITOR) 40 MG tablet TAKE 1 TABLET (40 MG TOTAL) BY MOUTH AT BEDTIME.  90 tablet  1  . cyanocobalamin (,VITAMIN B-12,) 1000 MCG/ML injection Inject 1 mL (1,000 mcg total) into the muscle every 30 (thirty) days.  10 mL  3  . furosemide (LASIX) 40 MG tablet TAKE 1 TABLET BY MOUTH EVERY DAY  30 tablet  6  . gabapentin (NEURONTIN) 600 MG tablet Take 1,200 mg by mouth at bedtime.      . lamoTRIgine (LAMICTAL) 200 MG tablet Take 200 mg by mouth daily.      Marland Kitchen levothyroxine (SYNTHROID) 112 MCG tablet Take 2 tablets (224 mcg total) by mouth daily before breakfast.  180 tablet  3  . metFORMIN (GLUCOPHAGE) 500 MG tablet Take 1 tablet (500 mg total) by mouth daily with breakfast.  30 tablet  3  . naproxen (NAPROSYN) 500 MG  tablet Take one po qd x 5 days then prn pain, take with food  30 tablet  0  . NEEDLE, DISP, 25 G (MONOJECT MAGELLAN SAFETY NDL) 25G X 1" MISC by Does not apply route as directed.        . nystatin ointment (MYCOSTATIN) Apply 1 application topically daily as needed. Apply to red areas in groin      . omeprazole (PRILOSEC) 40 MG capsule Take 1 capsule (  40 mg total) by mouth daily.  30 capsule  3  . potassium chloride (K-DUR) 10 MEQ tablet TAKE 2 TABLETS (20 MEQ TOTAL) BY MOUTH DAILY.  180 tablet  3  . ramipril (ALTACE) 10 MG capsule TAKE 1 CAPSULE (10 MG TOTAL) BY MOUTH DAILY.  90 capsule  1  . rOPINIRole (REQUIP) 2 MG tablet Take 1 tablet (2 mg total) by mouth at bedtime.  30 tablet  6  . traMADol (ULTRAM) 50 MG tablet Take 1 tablet (50 mg total) by mouth every 12 (twelve) hours as needed for moderate pain.  30 tablet  0  . Vilazodone HCl (VIIBRYD) 40 MG TABS Take 40 mg by mouth every morning.       No current facility-administered medications on file prior to visit.     Review of Systems  Constitutional: Positive for unexpected weight change. Negative for fever, chills, activity change, appetite change and fatigue.  HENT: Negative for hearing loss.   Eyes: Negative for visual disturbance.  Respiratory: Positive for cough. Negative for chest tightness, shortness of breath and wheezing.   Cardiovascular: Negative for chest pain, palpitations and leg swelling.  Gastrointestinal: Negative for nausea, vomiting, abdominal pain, diarrhea, constipation, blood in stool and abdominal distention.  Genitourinary: Negative for hematuria and difficulty urinating.  Musculoskeletal: Negative for arthralgias, myalgias and neck pain.  Skin: Negative for rash.  Neurological: Negative for dizziness, seizures, syncope and headaches.  Hematological: Negative for adenopathy. Bruises/bleeds easily.  Psychiatric/Behavioral: Positive for dysphoric mood. The patient is not nervous/anxious.        Objective:    Physical Exam  Nursing note and vitals reviewed. Constitutional: She is oriented to person, place, and time. She appears well-developed and well-nourished. No distress.  HENT:  Head: Normocephalic and atraumatic.  Right Ear: Hearing, tympanic membrane, external ear and ear canal normal.  Left Ear: Hearing, tympanic membrane, external ear and ear canal normal.  Nose: Nose normal.  Mouth/Throat: Oropharynx is clear and moist. No oropharyngeal exudate.  Dry skin external ear bilaterally L>R  Eyes: Conjunctivae and EOM are normal. Pupils are equal, round, and reactive to light. No scleral icterus.  Neck: Normal range of motion. Neck supple. No thyromegaly present.  Cardiovascular: Normal rate, regular rhythm, normal heart sounds and intact distal pulses.   No murmur heard. Pulses:      Radial pulses are 2+ on the right side, and 2+ on the left side.  Pulmonary/Chest: Effort normal and breath sounds normal. No respiratory distress. She has no wheezes. She has no rales.  Abdominal: Soft. Bowel sounds are normal. She exhibits no distension and no mass. There is no tenderness. There is no rebound and no guarding.  Musculoskeletal: Normal range of motion. She exhibits edema (nonpitting).  Difficult exam given body habitus but no obvious effusion. Tender at L pes anserine bursitis.  No pain along joint line or popliteal fossa. No pain with flexion/extension of knee. No pain with mcmurrays test  Lymphadenopathy:    She has no cervical adenopathy.  Neurological: She is alert and oriented to person, place, and time.  CN grossly intact, station and gait intact  Skin: Skin is warm and dry. No rash noted.  Psychiatric: She has a normal mood and affect. Her behavior is normal. Judgment and thought content normal.       Assessment & Plan:

## 2013-05-12 NOTE — Assessment & Plan Note (Signed)
Stable on metformin 500mg  daily.  Continue this dose.  Foot exam today.

## 2013-05-12 NOTE — Assessment & Plan Note (Signed)
Given location of pain, anticipate pes anserine bursitis.  Treat with naprosyn, and provided with exercises from [facility] pt advisor. Update if sxs persist or worsen for referral to ortho. Pt agrees with plan.

## 2013-05-12 NOTE — Progress Notes (Signed)
Pre-visit discussion using our clinic review tool. No additional management support is needed unless otherwise documented below in the visit note.  

## 2013-05-12 NOTE — Assessment & Plan Note (Signed)
Due for recheck next month.  Asks if she can have this checked at [place] office - I will check with Terri.

## 2013-05-12 NOTE — Assessment & Plan Note (Signed)
Body mass index is 52.39 kg/(m^2).  [facility]

## 2013-05-12 NOTE — Assessment & Plan Note (Signed)
stable on current lipitor regimen - continue

## 2013-05-12 NOTE — Assessment & Plan Note (Addendum)
I have personally reviewed the Medicare Annual Wellness questionnaire and have noted 1. The patient's medical and social history 2. Their use of alcohol, tobacco or illicit drugs 3. Their current medications and supplements 4. The patient's functional ability including ADL's, fall risks, home safety risks and hearing or visual impairment. 5. Diet and physical activity 6. Evidence for depression or mood disorders The patients weight, height, BMI have been recorded in the chart.  Hearing and vision has been addressed. I have made referrals, counseling and provided education to the patient based review of the above and I have provided the pt with a written personalized care plan for preventive services. See scanned questionairre. Advanced directives discussed: pt will bring me copy.  Reviewed preventative protocols and updated unless pt declined.  iFOB today per pt preference. Discussed immunizations - pt will check with insurance on zostavax. Well woman with OBGYN - overdue.  I encouraged pt to schedule this.

## 2013-05-12 NOTE — Assessment & Plan Note (Deleted)
Treat with TCI cream - update if sxs persist or worsen 

## 2013-05-12 NOTE — Assessment & Plan Note (Signed)
Stable on current regimen - continue. 

## 2013-05-19 ENCOUNTER — Other Ambulatory Visit: Payer: Medicare Other

## 2013-05-21 ENCOUNTER — Other Ambulatory Visit: Payer: Self-pay | Admitting: Family Medicine

## 2013-05-22 ENCOUNTER — Other Ambulatory Visit: Payer: Medicare Other

## 2013-06-09 ENCOUNTER — Other Ambulatory Visit: Payer: Self-pay | Admitting: Cardiology

## 2013-06-11 ENCOUNTER — Other Ambulatory Visit: Payer: Self-pay | Admitting: Cardiology

## 2013-06-19 ENCOUNTER — Ambulatory Visit (INDEPENDENT_AMBULATORY_CARE_PROVIDER_SITE_OTHER): Payer: Medicare Other | Admitting: Family Medicine

## 2013-06-19 ENCOUNTER — Encounter: Payer: Self-pay | Admitting: Family Medicine

## 2013-06-19 VITALS — BP 124/82 | HR 86 | Temp 98.2°F | Wt 320.5 lb

## 2013-06-19 DIAGNOSIS — M79605 Pain in left leg: Secondary | ICD-10-CM

## 2013-06-19 DIAGNOSIS — M79604 Pain in right leg: Secondary | ICD-10-CM | POA: Insufficient documentation

## 2013-06-19 DIAGNOSIS — M79609 Pain in unspecified limb: Secondary | ICD-10-CM

## 2013-06-19 DIAGNOSIS — M533 Sacrococcygeal disorders, not elsewhere classified: Secondary | ICD-10-CM | POA: Insufficient documentation

## 2013-06-19 MED ORDER — TRAMADOL HCL 50 MG PO TABS: 50.0000 mg | ORAL_TABLET | Freq: Two times a day (BID) | ORAL | Status: DC | PRN

## 2013-06-19 NOTE — Assessment & Plan Note (Signed)
Describes new worsening leg pain over the last week, superimposed on chronic calf pain in past.   ?claudication related We have not evaluated for arterial insufficiency so I have ordered ABI today.

## 2013-06-19 NOTE — Patient Instructions (Addendum)
Let's back off naproxyn as it may not be helping. Let's do trial of tramadol - stronger pain medication. i do think you injured your tailbone - time should help heal this. Let us know if not improving with this. Pass by [facility]'s office to schedule ABI = blood pressures of legs.

## 2013-06-19 NOTE — Progress Notes (Signed)
Pre-visit discussion using our clinic review tool. No additional management support is needed unless otherwise documented below in the visit note.  

## 2013-06-19 NOTE — Progress Notes (Signed)
Subjective:    Patient ID: [name], female    DOB: 02/09/1951, 63 y.o.   MRN: [id number]  HPI CC: falls, leg pain  DOI: 06/15/2013 Feel Sunday in her garage when getting something out of car - may have tripped or lost her balance.  Broke sheet rock and then fell hitting bottom on concrete.  Bottom is hurting since then.  Did bruise from the fall.  Worse pain with transfer from sitting ot stnading.  Also over last 1 week (prior to fall) noticing strange sensation in legs different from her RLS.  Describes pain that starts in thighs and runs to dorsal feet R>L.  Entire leg affected.  Not burning, ache, sharp.  Difficulty describing pain.  Denies numbness or weakness of legs, denies mid back pain or fevers recently.  Does endorse calf pain with walking short distances that improves with rest - but this has been a longstanding issue.  Only new med is naproxyn.  Does think she is staying hydrated but not as well as she should over the last week. Lab Results  Component Value Date   CREATININE 0.80 11/28/2012   Lab Results  Component Value Date   HGBA1C 6.5 04/15/2013   Wt Readings from Last 3 Encounters:  06/19/13 320 lb 8 oz (145.378 kg)  05/12/13 329 lb 8 oz (149.46 kg)  04/15/13 324 lb (146.965 kg)   Body mass index is 50.96 kg/(m^2).  Past Medical History  Diagnosis Date  . Vitamin B 12 deficiency   . Anxiety   . Depression   . Hyperlipidemia   . Hypothyroid   . CAD (coronary artery disease)     nonobstructive plaque by cath 2013  . Obesity     morbid, seen dietician 09/2011  . T2DM (type 2 diabetes mellitus)   . Vitamin D deficiency     last check 47 (11/2010)  . Fibula fracture   . Fatty liver 2006    [place]  . 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 Diastolic CHF 10/9627    EF 55-60%, nl valves  . RLS (restless legs syndrome)   . Arthritis   . GERD (gastroesophageal reflux disease)   . Hypertension   . Complication of anesthesia     hard time  waking up 02 sats 85-89% range   . OSA (obstructive sleep apnea)     medium quattro full face mask CPAP 10cm  . Pneumonia     hx of pneumonia   . Myelolipoma 10/2010    stable L adrenal myelolipoma    Current Outpatient Prescriptions on File Prior to Visit  Medication Sig  . acetaminophen (TYLENOL) 325 MG tablet Take 2 tablets (650 mg total) by mouth every 4 (four) hours as needed (or Fever >/= 101).  Marland Kitchen ALPRAZolam (XANAX) 0.25 MG tablet Take 0.25 mg by mouth daily. For anxiety  . aspirin 81 MG tablet Take 81 mg by mouth every other day.   Marland Kitchen atorvastatin (LIPITOR) 40 MG tablet TAKE 1 TABLET (40 MG TOTAL) BY MOUTH AT BEDTIME.  . cyanocobalamin (,VITAMIN B-12,) 1000 MCG/ML injection Inject 1 mL (1,000 mcg total) into the muscle every 30 (thirty) days.  . furosemide (LASIX) 40 MG tablet TAKE 1 TABLET BY MOUTH EVERY DAY  . gabapentin (NEURONTIN) 600 MG tablet Take 1,200 mg by mouth at bedtime.  . lamoTRIgine (LAMICTAL) 200 MG tablet Take 200 mg by mouth daily.  Marland Kitchen levothyroxine (SYNTHROID) 112 MCG tablet Take 2  tablets (224 mcg total) by mouth daily before breakfast.  . metFORMIN (GLUCOPHAGE) 500 MG tablet Take 1 tablet (500 mg total) by mouth daily with breakfast.  . naproxen (NAPROSYN) 500 MG tablet Take 1 tablet (500 mg total) by mouth daily as needed.  Marland Kitchen NEEDLE, DISP, 25 G (MONOJECT MAGELLAN SAFETY NDL) 25G X 1" MISC by Does not apply route as directed.    . nystatin ointment (MYCOSTATIN) Apply 1 application topically daily as needed. Apply to red areas in groin  . omeprazole (PRILOSEC) 40 MG capsule Take 1 capsule (40 mg total) by mouth daily.  . potassium chloride (K-DUR) 10 MEQ tablet TAKE 2 TABLETS (20 MEQ TOTAL) BY MOUTH DAILY.  . ramipril (ALTACE) 10 MG capsule TAKE 1 CAPSULE (10 MG TOTAL) BY MOUTH DAILY.  Marland Kitchen rOPINIRole (REQUIP) 2 MG tablet Take 1 tablet (2 mg total) by mouth at bedtime.  . topiramate (TOPAMAX) 25 MG tablet Take 25 mg by mouth 2 (two) times daily.  Marland Kitchen triamcinolone cream  (KENALOG) 0.1 % Apply 1 application topically 2 (two) times daily. Apply to AA on external ear, don't use more than 2 weeks at a time.  . Vilazodone HCl (VIIBRYD) 40 MG TABS Take 40 mg by mouth every morning.   No current facility-administered medications on file prior to visit.    Review of Systems Per HPI    Objective:   Physical Exam  Nursing note and vitals reviewed. Constitutional: She appears well-developed and well-nourished. No distress.  Cardiovascular: Normal rate, regular rhythm, normal heart sounds and intact distal pulses.   No murmur heard. Pulmonary/Chest: Effort normal and breath sounds normal. No respiratory distress. She has no wheezes. She has no rales.  Musculoskeletal: She exhibits edema (nonpitting).  No midline spine tenderness, no paraspinous mm tenderness Neg SLR bilaterally, neg FABER No pain with palpation of sciatic notch.  Mildly tender at L SIJ. 1+ DP bilaterally  Neurological:  5/5 strenght BLE Sensation intact. Diminished patellar reflexes bilaterally       Assessment & Plan:

## 2013-06-19 NOTE — Assessment & Plan Note (Signed)
New after recent fall in garage - suspicious for tailbone injury.  Recommended supportive care with ice, continued cushion use, and may start tramadol prn pain.  (naprosyn did not help pain so recommended she stop this.)

## 2013-06-19 NOTE — Addendum Note (Signed)
Addended by: Ria Bush on: 06/19/2013 10:38 AM   Modules accepted: Orders

## 2013-06-23 ENCOUNTER — Other Ambulatory Visit: Payer: Self-pay | Admitting: Family Medicine

## 2013-06-24 ENCOUNTER — Encounter: Payer: Self-pay | Admitting: Internal Medicine

## 2013-06-24 ENCOUNTER — Ambulatory Visit (INDEPENDENT_AMBULATORY_CARE_PROVIDER_SITE_OTHER): Payer: Medicare Other | Admitting: Internal Medicine

## 2013-06-24 VITALS — BP 124/78 | HR 72 | Temp 98.6°F | Wt 331.0 lb

## 2013-06-24 DIAGNOSIS — J019 Acute sinusitis, unspecified: Secondary | ICD-10-CM

## 2013-06-24 MED ORDER — AMOXICILLIN-POT CLAVULANATE 875-125 MG PO TABS: 1.0000 | ORAL_TABLET | Freq: Two times a day (BID) | ORAL | Status: DC

## 2013-06-24 NOTE — Progress Notes (Signed)
HPI  Pt presents to the clinic today with c/o head and nasal congestion. She reports this started 4 days ago. She reports she is blowing thick yellow mucous. She has used her inhaler, Neti Pto, Tylenol and Ibuprofen. She has also used Nasal spray.  Review of Systems    Past Medical History  Diagnosis Date  . Vitamin B 12 deficiency   . Anxiety   . Depression   . Hyperlipidemia   . Hypothyroid   . CAD (coronary artery disease)     nonobstructive plaque by cath 2013  . Obesity     morbid, seen dietician 09/2011  . T2DM (type 2 diabetes mellitus)   . Vitamin D deficiency     last check 47 (11/2010)  . Fibula fracture   . Fatty liver 2006    [place]  . 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 Diastolic CHF 11/1243    EF 55-60%, nl valves  . RLS (restless legs syndrome)   . Arthritis   . GERD (gastroesophageal reflux disease)   . Hypertension   . Complication of anesthesia     hard time waking up 02 sats 85-89% range   . OSA (obstructive sleep apnea)     medium quattro full face mask CPAP 10cm  . Pneumonia     hx of pneumonia   . Myelolipoma 10/2010    stable L adrenal myelolipoma    Family History  Problem Relation Age of Onset  . Coronary artery disease Mother 84    14 angioplasty and 5v CABG  . Stroke Mother   . Diabetes Mother   . Heart attack Father 21  . Heart attack Brother 19    deceased age 13    History   Social History  . Marital Status: Divorced    Spouse Name: N/A    Number of Children: 1  . Years of Education: N/A   Occupational History  . Sales promotion account executive    Social History Main Topics  . Smoking status: Former Smoker    Quit date: 08/08/1997  . Smokeless tobacco: Never Used  . Alcohol Use: No  . Drug Use: No  . Sexual Activity: No   Other Topics Concern  . Not on file   Social History Narrative   Caffeine: 1 cup coffee/day   Lives with daughter   [facility]   Diet: watching diet, low  sodium. Good water.  Fruits/vegetables daily.      Adenosine Myoview nml 08/20/07   HOSP CP R/O'd Nonobstruct CAD by Cath EF 60% 5/27-10/13/2009   CATH and ECHO Nonobstr CAD  EF 60% 10/12/2009    Allergies  Allergen Reactions  . Erythromycin Base Nausea And Vomiting  . Neomycin     vomiting      Constitutional: Positive headache, fatigue and fever. Denies fever or abrupt weight changes.  HEENT:  Positive eye pain, pressure behind the eyes, facial pain, nasal congestion and sore throat. Denies eye redness, ear pain, ringing in the ears, wax buildup, runny nose or bloody nose. Respiratory: Positive cough. Denies difficulty breathing or shortness of breath.  Cardiovascular: Denies chest pain, chest tightness, palpitations or swelling in the hands or feet.   No other specific complaints in a complete review of systems (except as listed in HPI above).  Objective:    General: Appears his stated age, well developed, well nourished in NAD. HEENT: Head: normal shape and size, sinus tenderness  noted; Eyes: sclera white, no icterus, conjunctiva pink, PERRLA and EOMs intact; Ears: Tm's gray and intact, normal light reflex; Nose: mucosa pink and moist, septum midline; Throat/Mouth: + PND. Teeth present, mucosa erythematous and moist, no exudate noted, no lesions or ulcerations noted.  Neck: Neck supple, trachea midline. No massses, lumps or thyromegaly present.  Cardiovascular: Normal rate and rhythm. S1,S2 noted.  No murmur, rubs or gallops noted. No JVD or BLE edema. No carotid bruits noted. Pulmonary/Chest: Normal effort and positive vesicular breath sounds. No respiratory distress. No wheezes, rales or ronchi noted.      Assessment & Plan:   Acute bacterial sinusitis  Can use a Neti Pot which can be purchased from your local drug store. Continue Nasal Spray and Ibuprofen Augmentin BID for 10 days  RTC as needed or if symptoms persist.

## 2013-06-24 NOTE — Patient Instructions (Signed)

## 2013-06-24 NOTE — Progress Notes (Signed)
HPI: Pt presents to the office today with complaints of nasal congestion and cough. Pt states symptoms started 4 days ago and have progressively gotten worse. She endorses chest congestion, nasal drainage, headache, fever, chills, and ear pain. Pt states drainage is yellow and thick. She has tried neti pot, Ibuprofen, and saline nasal spray, with minimal relief.     Review of Systems    Past Medical History  Diagnosis Date  . Vitamin B 12 deficiency   . Anxiety   . Depression   . Hyperlipidemia   . Hypothyroid   . CAD (coronary artery disease)     nonobstructive plaque by cath 2013  . Obesity     morbid, seen dietician 09/2011  . T2DM (type 2 diabetes mellitus)   . Vitamin D deficiency     last check 47 (11/2010)  . Fibula fracture   . Fatty liver 2006    [place]  . 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 Diastolic CHF 11/348    EF 55-60%, nl valves  . RLS (restless legs syndrome)   . Arthritis   . GERD (gastroesophageal reflux disease)   . Hypertension   . Complication of anesthesia     hard time waking up 02 sats 85-89% range   . OSA (obstructive sleep apnea)     medium quattro full face mask CPAP 10cm  . Pneumonia     hx of pneumonia   . Myelolipoma 10/2010    stable L adrenal myelolipoma    Family History  Problem Relation Age of Onset  . Coronary artery disease Mother 67    14 angioplasty and 5v CABG  . Stroke Mother   . Diabetes Mother   . Heart attack Father 39  . Heart attack Brother 51    deceased age 68    History   Social History  . Marital Status: Divorced    Spouse Name: N/A    Number of Children: 1  . Years of Education: N/A   Occupational History  . Sales promotion account executive    Social History Main Topics  . Smoking status: Former Smoker    Quit date: 08/08/1997  . Smokeless tobacco: Never Used  . Alcohol Use: No  . Drug Use: No  . Sexual Activity: No   Other Topics Concern  . Not on file   Social History Narrative   Caffeine: 1  cup coffee/day   Lives with daughter   [facility]   Diet: watching diet, low sodium. Good water.  Fruits/vegetables daily.      Adenosine Myoview nml 08/20/07   HOSP CP R/O'd Nonobstruct CAD by Cath EF 60% 5/27-10/13/2009   CATH and ECHO Nonobstr CAD  EF 60% 10/12/2009    Allergies  Allergen Reactions  . Erythromycin Base Nausea And Vomiting  . Neomycin     vomiting      Constitutional: Positive headache, fatigue and fever. Denies abrupt weight changes.  HEENT:  Positive eye pain, pressure behind the eyes, facial pain, nasal congestion, ear pain and sore throat. Denies eye redness, ringing in the ears, wax buildup, runny nose or bloody nose. Respiratory: Positive cough. Denies difficulty breathing or shortness of breath.  Cardiovascular: Denies chest pain, chest tightness, palpitations or swelling in the hands or feet.   No other specific complaints in a complete review of systems (except as listed in HPI above).  Objective:    General: Appears his stated age,  well developed, well nourished in NAD. HEENT: Head: normal shape and size, sinus tenderness noted; Eyes: sclera white, no icterus, conjunctiva pink, PERRLA and EOMs intact; Ears: Tm's gray and intact, normal light reflex; Nose: mucosa pink and moist, septum midline, maxillary sinus tenderness ; Throat/Mouth: + PND. Teeth present, mucosa pink and moist, uvula midline, red/irritated; no exudate noted, no lesions or ulcerations noted.  Neck: Neck supple, trachea midline. No massses, lumps or thyromegaly present.  Cardiovascular: Normal rate and rhythm. S1,S2 noted.  No murmur, rubs or gallops noted. No JVD or BLE edema. No carotid bruits noted. Pulmonary/Chest: Normal effort and positive vesicular breath sounds. No respiratory distress. No wheezes, rales or ronchi noted.      Assessment & Plan:   Acute bacterial sinusitis  Can use a Neti Pot which can be purchased from your local drug  store. Flonase 2 sprays each nostril for 3 days and then as needed. Augmentin BID for 10 days  RTC as needed or if symptoms persist.  Andres Escandon, Demetrius Charity, Student-NP

## 2013-06-24 NOTE — Progress Notes (Signed)
Pre-visit discussion using our clinic review tool. No additional management support is needed unless otherwise documented below in the visit note.  

## 2013-06-25 ENCOUNTER — Telehealth: Payer: Self-pay

## 2013-06-25 MED ORDER — DOXYCYCLINE HYCLATE 100 MG PO CAPS: 100.0000 mg | ORAL_CAPSULE | Freq: Two times a day (BID) | ORAL | Status: DC

## 2013-06-25 NOTE — Telephone Encounter (Signed)
plz notify we've sent in 10d course of doxycycline for sinus infection.

## 2013-06-25 NOTE — Telephone Encounter (Signed)
Pt was seen in office 06/24/13; one hour after taking Augmentin (pt had eaten prior to taking med) pt began with watery diarrhea shortly after 10 AM until late last night; pt only took one dose of med. Diarrhea has stopped this AM. Pt request different antibiotic to [facility].(Regina Baity NP not in office until this afternoon; will send note to PCP).

## 2013-06-26 NOTE — Telephone Encounter (Signed)
Patient notified and started med last PM.

## 2013-07-04 ENCOUNTER — Telehealth: Payer: Self-pay

## 2013-07-04 ENCOUNTER — Other Ambulatory Visit: Payer: Self-pay | Admitting: Family Medicine

## 2013-07-04 MED ORDER — DOXYCYCLINE HYCLATE 100 MG PO CAPS: 100.0000 mg | ORAL_CAPSULE | Freq: Two times a day (BID) | ORAL | Status: DC

## 2013-07-04 NOTE — Telephone Encounter (Signed)
Opened in error

## 2013-07-04 NOTE — Telephone Encounter (Signed)
Will extend abx course for another 5 days.

## 2013-07-04 NOTE — Telephone Encounter (Signed)
Pt was seen on 06/24/13 and will finish doxycycline on 07/05/13; pt feels better but all symptoms not completely cleared; pt still has head congestion, when blows nose still yellow mucus,prod cough with yellow phlegm, no fever, no SOB or wheezing. Pt request refill doxycycline to completely get rid of symptoms.pt request cb today. [facility]

## 2013-07-04 NOTE — Telephone Encounter (Signed)
Patient notified

## 2013-07-18 ENCOUNTER — Telehealth: Payer: Self-pay

## 2013-07-18 NOTE — Telephone Encounter (Signed)
Pt has ins rep at her home now and request to know what was on discharge AVS 11/2012 for hospital problems. Advised pt listed on AVS hospital problems was CAD, Coronary Artery Disease and chest pain. Pt voiced understanding.

## 2013-07-27 ENCOUNTER — Other Ambulatory Visit: Payer: Self-pay | Admitting: Family Medicine

## 2013-08-04 ENCOUNTER — Telehealth: Payer: Self-pay

## 2013-08-04 NOTE — Telephone Encounter (Signed)
Pt called and states she has had several episodes of CP, states she was awoken last Friday with severe pain, this happened twice. And also has chest pressure, states it goes and comes. Please call. Pt has an appt to see Nicole Kindred on Thursday.

## 2013-08-04 NOTE — Telephone Encounter (Signed)
Spoke w/ pt.  She reports that she had 2 episodes of chest pressure over the past 2 days that have woken her up at night. Describes chest pressure, similar to indigestion that goes away after a few mins on its on w/o intervention on her part. States she is at rest when this happens, but it is "pretty tough" and she was SOB. Reports that episode yesterday radiated to her left arm and jaw, she was sweaty and nauseous, but that the sx passed after a few mins. Advised pt to call the office if this happens again before her appt on Thurs so that we can perform an EKG. Advised her that if sx occur after hours or if she feels that the pain worsens, to call 911 and have EMS perform an EKG and treat her if necessary. She is agreeable to this and will call w/ further questions or concerns.

## 2013-08-07 ENCOUNTER — Other Ambulatory Visit: Payer: Self-pay | Admitting: Physician Assistant

## 2013-08-07 ENCOUNTER — Ambulatory Visit (INDEPENDENT_AMBULATORY_CARE_PROVIDER_SITE_OTHER): Payer: Medicare Other | Admitting: Physician Assistant

## 2013-08-07 ENCOUNTER — Encounter: Payer: Self-pay | Admitting: Physician Assistant

## 2013-08-07 VITALS — BP 132/92 | HR 76 | Ht 66.0 in | Wt 331.0 lb

## 2013-08-07 DIAGNOSIS — R0602 Shortness of breath: Secondary | ICD-10-CM

## 2013-08-07 DIAGNOSIS — R0789 Other chest pain: Secondary | ICD-10-CM

## 2013-08-07 DIAGNOSIS — I2510 Atherosclerotic heart disease of native coronary artery without angina pectoris: Secondary | ICD-10-CM

## 2013-08-07 DIAGNOSIS — E039 Hypothyroidism, unspecified: Secondary | ICD-10-CM

## 2013-08-07 DIAGNOSIS — I5032 Chronic diastolic (congestive) heart failure: Secondary | ICD-10-CM

## 2013-08-07 DIAGNOSIS — R079 Chest pain, unspecified: Secondary | ICD-10-CM

## 2013-08-07 DIAGNOSIS — E785 Hyperlipidemia, unspecified: Secondary | ICD-10-CM

## 2013-08-07 LAB — D-DIMER(ARMC): D-Dimer: 507 ng/ml

## 2013-08-07 MED ORDER — RANITIDINE HCL 75 MG PO TABS: 75.0000 mg | ORAL_TABLET | Freq: Two times a day (BID) | ORAL | Status: DC

## 2013-08-07 NOTE — Progress Notes (Signed)
Patient ID: [name], female   DOB: 20-Aug-1950, 63 y.o.   MRN: [id number]   Date:  08/07/2013   ID:  Caitlyn, Trujillo [birth year], MRN [id number]  PCP:  Ria Bush, MD  Primary Cardiologist:  Johnny Bridge, MD   History of Present Illness:  [name] is a 63 y.o. female w/ PMHx s/f nonobstructive CAD, HFpEF, DM2, HTN, HLD, morbid obesity, hypothyroidism, OSA (on CPAP), anxiety and depression who presents for follow-up today.  She has been complaining of intermittent chest pain.   She presented to [facility] 08/2011 c/o chest pain. She ruled out. Cardiac catheterization 08/2011: nonobstructive CAD- minor mid LAD luminal irregularity, 30-50% ostial D1; LVEF 55-65%. D-dimer returned WNL. Discomfort was felt to be secondary to decompensated diastolic CHF vs atypical/non-cardiac causes.  She was admitted again to [facility] 11/2012 c/o R sided chest pain with radiation to her jaw. She again ruled out. Lexiscan Myoview 12/04/2012: no evidence of ischemia, infarct or WMA. EF 67%. Mild apical thinning. Stress only images sufficient for interpretation.  Hgb A1C 6.5% in 04/2013 TSH 17.28 in 12/2012, 26.95 in 04/2013  The patient's history is somewhat vague on description. She reports experiencing two different types of chest pain. Over the past few weeks, she has noticed a chest pressure occurring mostly at rest and lasting several hours. She also reports experiencing two episodes of sharp left-sided chest pain awakening her from sleep last night. No relation to exertion. No relation to arms or jaw. Unable to determine if deep inspiration or position changes provoked the pain. She had an episode of diaphoresis without chest discomfort at rest after performing household chores. She notes intermittent nausea not related to chest discomfort. She does note dyspnea on exertion, but attributes this to decionditioning from weight gain. She has noticed R>L leg swelling. No redness or pain.   EKG:  NSR, 76 bpm, diffuse ST scooping, subtle, non-pathologic Q wavs III, aVF  Wt Readings from Last 3 Encounters:  08/07/13 331 lb (150.141 kg)  06/24/13 331 lb (150.141 kg)  06/19/13 320 lb 8 oz (145.378 kg)     Past Medical History  Diagnosis Date  . Vitamin B 12 deficiency   . Anxiety   . Depression   . Hyperlipidemia   . Hypothyroidism   . CAD (coronary artery disease)     nonobstructive plaque by cath 2013  . Morbid obesity     seen dietician 09/2011  . T2DM (type 2 diabetes mellitus)   . Vitamin D deficiency     last check 47 (11/2010)  . Fibula fracture   . Fatty liver 2006    [place]  . 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 Chronic diastolic CHF (congestive heart failure) 08/2011    EF 55-60%, nl valves  . RLS (restless legs syndrome)   . Arthritis   . GERD (gastroesophageal reflux disease)   . Hypertension   . Complication of anesthesia     hard time waking up 02 sats 85-89% range   . OSA (obstructive sleep apnea)     medium quattro full face mask CPAP 10cm  . 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 acetaminophen (TYLENOL) 325 MG tablet Take 2 tablets (650 mg total) by mouth every 4 (four) hours as needed (or Fever >/= 101).      Marland Kitchen ALPRAZolam (XANAX) 0.25 MG tablet Take  0.25 mg by mouth daily. For anxiety      . aspirin 81 MG tablet Take 81 mg by mouth every other day.       Marland Kitchen atorvastatin (LIPITOR) 40 MG tablet TAKE 1 TABLET (40 MG TOTAL) BY MOUTH AT BEDTIME.  90 tablet  1  . cyanocobalamin (,VITAMIN B-12,) 1000 MCG/ML injection Inject 1 mL (1,000 mcg total) into the muscle every 30 (thirty) days.  10 mL  3  . doxycycline (VIBRAMYCIN) 100 MG capsule Take 1 capsule (100 mg total) by mouth 2 (two) times daily.  10 capsule  0  . furosemide (LASIX) 40 MG tablet TAKE 1 TABLET BY MOUTH EVERY DAY  30 tablet  6  . gabapentin (NEURONTIN) 600 MG tablet Take 1,200 mg by mouth at  bedtime.      . lamoTRIgine (LAMICTAL) 200 MG tablet Take 200 mg by mouth daily.      Marland Kitchen levothyroxine (SYNTHROID) 112 MCG tablet Take 2 tablets (224 mcg total) by mouth daily before breakfast.  180 tablet  3  . metFORMIN (GLUCOPHAGE) 500 MG tablet Take 1 tablet (500 mg total) by mouth daily with breakfast.  30 tablet  3  . naproxen (NAPROSYN) 500 MG tablet TAKE 1 TABLET (500 MG TOTAL) BY MOUTH DAILY AS NEEDED.  30 tablet  0  . NEEDLE, DISP, 25 G (MONOJECT MAGELLAN SAFETY NDL) 25G X 1" MISC by Does not apply route as directed.        . nystatin ointment (MYCOSTATIN) Apply 1 application topically daily as needed. Apply to red areas in groin      . omeprazole (PRILOSEC) 40 MG capsule Take 1 capsule (40 mg total) by mouth daily.  30 capsule  3  . potassium chloride (K-DUR) 10 MEQ tablet TAKE 2 TABLETS (20 MEQ TOTAL) BY MOUTH DAILY.  180 tablet  3  . ramipril (ALTACE) 10 MG capsule TAKE 1 CAPSULE (10 MG TOTAL) BY MOUTH DAILY.  90 capsule  1  . rOPINIRole (REQUIP) 2 MG tablet Take 1 tablet (2 mg total) by mouth at bedtime.  30 tablet  6  . topiramate (TOPAMAX) 25 MG tablet Take 25 mg by mouth 2 (two) times daily.      Marland Kitchen triamcinolone cream (KENALOG) 0.1 % Apply 1 application topically 2 (two) times daily. Apply to AA on external ear, don't use more than 2 weeks at a time.  30 g  0  . Vilazodone HCl (VIIBRYD) 40 MG TABS Take 40 mg by mouth every morning.       No current facility-administered medications for this visit.    Allergies:    Allergies  Allergen Reactions  . Augmentin [Amoxicillin-Pot Clavulanate] Diarrhea    Watery diarrhea and abd pain  . Erythromycin Base Nausea And Vomiting  . Neomycin     vomiting     Social History:  The patient  reports that she quit smoking about 16 years ago. She has never used smokeless tobacco. She reports that she does not drink alcohol or use illicit drugs.   Family History:  Family History  Problem Relation Age of Onset  . Coronary artery disease  Mother 81    14 angioplasty and 5v CABG  . Stroke Mother   . Diabetes Mother   . Heart attack Father 44  . Heart attack Brother 20    deceased age 85    Review of Systems: General: negative for chills, fever, night sweats or weight changes.  Cardiovascular: positive for chest pain,  negative for dyspnea on exertion, edema, orthopnea, palpitations, paroxysmal nocturnal dyspnea or shortness of breath Dermatological: negative for rash Respiratory: negative for cough or wheezing Urologic: negative for hematuria Abdominal: positive for nausea, negative for vomiting, diarrhea, bright red blood per rectum, melena, or hematemesis Neurologic: negative for visual changes, syncope, or dizziness All other systems reviewed and are otherwise negative except as noted above.  PHYSICAL EXAM: VS:  BP 132/92  Pulse 76  Ht 5\' 6"  (1.676 m)  Wt 331 lb (150.141 kg)  BMI 53.45 kg/m2 Well nourished, obese-appearing, in no acute distress HEENT: normal, PERRL Neck: no JVD or bruits Cardiac:  normal S1, S2; RRR; no murmur or gallops Lungs:  clear to auscultation bilaterally, no wheezing, rhonchi or rales Abd: soft, nontender, no hepatomegaly, normoactive BS x 4 quads Ext: no appreciable edema, cyanosis or clubbing Skin: warm and dry, cap refill < 2 sec Neuro:  CNs 2-12 intact, no focal abnormalities noted Musculoskeletal: strength and tone appropriate for age  Psych: normal affect

## 2013-08-07 NOTE — Patient Instructions (Signed)
Please try over the counter Zantac to relieve possible reflux symptoms contributing to your chest pain.   We will check a blood clot marker today to rule out that possibility.   We will see you back in 6 months for follow-up. Please call sooner for any concerns or worsening of symptoms.

## 2013-08-08 NOTE — Assessment & Plan Note (Signed)
Stable. No evidence of ischemia on EKG. Chest discomfort description non-cardiac.

## 2013-08-08 NOTE — Assessment & Plan Note (Signed)
Euvolemic on exam. Continue Lasix and home antihypertensives.

## 2013-08-08 NOTE — Assessment & Plan Note (Signed)
Continue statin. 

## 2013-08-08 NOTE — Assessment & Plan Note (Signed)
Recent TSH levels elevated. Uncontrolled hypothyroidism certainly could contribute to symptoms. Advised PCP follow-up to evaluate this.

## 2013-08-08 NOTE — Assessment & Plan Note (Signed)
Chest pain description is atypical in quality. She endorses DOE. Suspect this is related to obesity and deconditioning. Given endorsement of unilateral leg swelling, will obtain a D-dimer. Nonobstructive CAD by 2013 cath. Normal Lexiscan Myoview 11/2012. Will refer back to PCP to check follow-up TFTs. Start Zantac in addition to PPI to cover for reflux.

## 2013-08-20 ENCOUNTER — Ambulatory Visit: Payer: Medicare Other | Admitting: Cardiovascular Disease

## 2013-08-21 ENCOUNTER — Other Ambulatory Visit: Payer: Self-pay

## 2013-09-07 ENCOUNTER — Other Ambulatory Visit: Payer: Self-pay | Admitting: Family Medicine

## 2013-09-08 ENCOUNTER — Other Ambulatory Visit: Payer: Self-pay | Admitting: Family Medicine

## 2013-09-08 ENCOUNTER — Encounter: Payer: Self-pay | Admitting: Family Medicine

## 2013-09-08 MED ORDER — ROPINIROLE HCL 2 MG PO TABS: 2.0000 mg | ORAL_TABLET | Freq: Every day | ORAL | Status: DC

## 2013-09-08 NOTE — Telephone Encounter (Signed)
Ok to refill 

## 2013-09-08 NOTE — Telephone Encounter (Signed)
Patient notified and advised to schedule labs vis Mychart.

## 2013-09-08 NOTE — Telephone Encounter (Signed)
plz notify 90d supply sent in and to schedule lab visit for thyroid.

## 2013-09-17 ENCOUNTER — Other Ambulatory Visit: Payer: Medicare Other

## 2013-09-21 ENCOUNTER — Encounter: Payer: Self-pay | Admitting: Family Medicine

## 2013-09-23 ENCOUNTER — Other Ambulatory Visit: Payer: Self-pay | Admitting: Family Medicine

## 2013-09-24 ENCOUNTER — Other Ambulatory Visit (INDEPENDENT_AMBULATORY_CARE_PROVIDER_SITE_OTHER): Payer: Medicare Other

## 2013-09-24 DIAGNOSIS — E039 Hypothyroidism, unspecified: Secondary | ICD-10-CM

## 2013-09-24 LAB — TSH: TSH: 3.15 u[IU]/mL (ref 0.35–4.50)

## 2013-11-06 ENCOUNTER — Ambulatory Visit: Payer: BC Managed Care – PPO | Admitting: Neurology

## 2013-11-08 ENCOUNTER — Other Ambulatory Visit: Payer: Self-pay | Admitting: Family Medicine

## 2013-11-26 ENCOUNTER — Telehealth: Payer: Self-pay

## 2013-11-26 NOTE — Telephone Encounter (Signed)
Pt left v/m for Kim; ;;message was not to call that med in; pt does not need that medication. That was entire message. Any questions give pt a cb.

## 2013-11-27 NOTE — Telephone Encounter (Signed)
Noted and understood

## 2013-12-03 ENCOUNTER — Ambulatory Visit: Payer: BC Managed Care – PPO | Admitting: Neurology

## 2013-12-07 ENCOUNTER — Encounter: Payer: Self-pay | Admitting: Neurology

## 2013-12-09 NOTE — Telephone Encounter (Signed)
Please address with janet bowen, administrator. CD

## 2013-12-15 NOTE — Telephone Encounter (Signed)
You will not be charged for this visit , if you will  keep your next appointment.

## 2014-01-08 ENCOUNTER — Encounter: Payer: Self-pay | Admitting: Neurology

## 2014-01-08 ENCOUNTER — Ambulatory Visit (INDEPENDENT_AMBULATORY_CARE_PROVIDER_SITE_OTHER): Payer: BC Managed Care – PPO | Admitting: Neurology

## 2014-01-08 VITALS — BP 120/74 | HR 74 | Resp 17 | Ht 66.25 in | Wt 338.0 lb

## 2014-01-08 DIAGNOSIS — G4730 Sleep apnea, unspecified: Principal | ICD-10-CM

## 2014-01-08 DIAGNOSIS — G4710 Hypersomnia, unspecified: Secondary | ICD-10-CM | POA: Insufficient documentation

## 2014-01-08 DIAGNOSIS — G4733 Obstructive sleep apnea (adult) (pediatric): Secondary | ICD-10-CM

## 2014-01-08 DIAGNOSIS — Z9989 Dependence on other enabling machines and devices: Secondary | ICD-10-CM

## 2014-01-08 DIAGNOSIS — J989 Respiratory disorder, unspecified: Secondary | ICD-10-CM

## 2014-01-08 HISTORY — DX: Hypersomnia, unspecified: G47.10

## 2014-01-08 MED ORDER — TOPIRAMATE 25 MG PO TABS: 25.0000 mg | ORAL_TABLET | Freq: Two times a day (BID) | ORAL | Status: DC

## 2014-01-08 NOTE — Patient Instructions (Signed)
  Please inquire about a nutritional medical weight loss program at [facility].

## 2014-01-08 NOTE — Progress Notes (Signed)
[facility]   Provider:  Larey Seat, M D  Referring Provider: Ria Bush, MD Primary Care Physician:  Ria Bush, MD  Chief Complaint  Patient presents with  . Follow-up    osa    HPI:  [name] is a 63 y.o. female  Is seen here as a revisit  from Dr. Danise Mina for CPAP follow up.  Patient  here for her yearly compliance visit, her CPAP download and contrast 90 days. Her compliance is 98% for use over 4 hours. The patient uses the machine on average 7 hours and 49 minutes. The residual AHI is 1.8 per hour, the machine is set at 10 cm water pressure with 3 cm EPR. Her sleepiness is higher than expected for the low residual apnea degree.   The patient leads a very active lifestyle and she has noticed that once she physically comes to rest she falls asleep. She was initially seen here on 7-to-13 for retrobulbar optic nerve swelling. Her optometrist and noted an optic nerve swelling but normal visual fields were associated however she had noticed a change in her visual acuity. She was started on Topamax for headache prevention, she had a brain MRI which showed no evidence of multiple sclerosis only of small vessel disease. The to grimace has helped with a retro-orbital pressure also. Again she has noticed that she is more daytime sleepy than she was during her last visit. Her BMI was 46.66 during her last visit.  She had a cardiac cath with good results. She started water aerobics.  She is not consistent.    Review of Systems: Out of a complete 14 system review, the patient complains of only the following symptoms, and all other reviewed systems are negative. patient reports sleepiness and fatigue,  The geriatric depression scale was endorsed at points, the Epworth sleepiness score at 15 points, the fatigue severity score at 23 points.    Last compliance visit.  CPAP download , AHI 2.8. Average daily use is 5.5 hours  Pressures at 10 cm  water Median daily usage 6.39 hours.  Report was for a time span of 35 days      History   Social History  . Marital Status: Divorced    Spouse Name: N/A    Number of Children: 1  . Years of Education: 13   Occupational History  . Sales promotion account executive   .     Social History Main Topics  . Smoking status: Former Smoker    Quit date: 08/08/1997  . Smokeless tobacco: Never Used  . Alcohol Use: No  . Drug Use: No  . Sexual Activity: No   Other Topics Concern  . Not on file   Social History Narrative   Patient is divorced and lives with her daughter.   Patient has one adult child.   Patient is retired.   Patient has one year of college.   Patient is right-handed.   Caffeine: 1 cup coffee/day   [facility]   Diet: watching diet, low sodium. Good water.  Fruits/vegetables daily.      Adenosine Myoview nml 08/20/07   HOSP CP R/O'd Nonobstruct CAD by Cath EF 60% 5/27-10/13/2009   CATH and ECHO Nonobstr CAD  EF 60% 10/12/2009          Family History  Problem Relation Age of Onset  . Coronary artery disease Mother 35    14 angioplasty and 5v CABG  . Stroke  Mother   . Diabetes Mother   . Heart attack Father 2  . Heart attack Brother 80    deceased age 49    Past Medical History  Diagnosis Date  . Vitamin B 12 deficiency   . Anxiety   . Depression   . Hyperlipidemia   . Hypothyroidism   . CAD (coronary artery disease)     nonobstructive plaque by cath 2013  . Morbid obesity     seen dietician 09/2011  . T2DM (type 2 diabetes mellitus)   . Vitamin D deficiency     last check 48 (11/2010)  . Fibula fracture   . Fatty liver 2006    [place]  . 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 Chronic diastolic CHF (congestive heart failure) 08/2011    EF 55-60%, nl valves  . RLS (restless legs syndrome)   . Arthritis   . GERD (gastroesophageal reflux disease)   . Hypertension   . Complication of anesthesia     hard time waking  up 02 sats 85-89% range   . OSA (obstructive sleep apnea)     medium quattro full face mask CPAP 10cm  . 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 Hypersomnia with sleep apnea, unspecified 01/08/2014    Past Surgical History  Procedure Laterality Date  . Osteotomy and ulnar shortening  2001    [name]  . Knee surgery Left 11-02-2008    Partial medial and lateral Menisectomies and Condroplasty (Dr. [name])  . Cardiac catheterization  10/12/09    EF 70%  . Mr abdomen  01/16/2011    Left 4cm adrenal myelolipoma  . Cardiac catheterization  08/23/2011    Mild nonobstructive CAD, normal LV fxn  . US echocardiography  08/2011    EF of 55-60% and indeterminate diastolic function  . Esophagogastroduodenoscopy  2006  . Ventral hernia repair  03/01/2012    Procedure: LAPAROSCOPIC VENTRAL HERNIA;  Surgeon: Ralene Ok, MD;  Location: [facility];  Service: General;  Laterality: N/A;    Current Outpatient Prescriptions  Medication Sig Dispense Refill  . ALPRAZolam (XANAX) 0.25 MG tablet Take 0.25 mg by mouth daily. For anxiety      . aspirin 81 MG tablet Take 81 mg by mouth every other day.       Marland Kitchen atorvastatin (LIPITOR) 40 MG tablet TAKE 1 TABLET (40 MG TOTAL) BY MOUTH AT BEDTIME.  90 tablet  1  . cyanocobalamin (,VITAMIN B-12,) 1000 MCG/ML injection Inject 1 mL (1,000 mcg total) into the muscle every 30 (thirty) days.  10 mL  3  . furosemide (LASIX) 40 MG tablet TAKE 1 TABLET BY MOUTH EVERY DAY  30 tablet  6  . gabapentin (NEURONTIN) 600 MG tablet Take 1,200 mg by mouth at bedtime.      . lamoTRIgine (LAMICTAL) 200 MG tablet Take 200 mg by mouth daily.      Marland Kitchen levothyroxine (SYNTHROID) 112 MCG tablet Take 2 tablets (224 mcg total) by mouth daily before breakfast.  180 tablet  3  . metFORMIN (GLUCOPHAGE) 500 MG tablet TAKE 1 TABLET (500 MG TOTAL) BY MOUTH DAILY WITH BREAKFAST.  30 tablet  3  . naproxen (NAPROSYN) 500 MG tablet TAKE 1 TABLET (500 MG TOTAL) BY MOUTH DAILY AS NEEDED.  30  tablet  0  . naproxen (NAPROSYN) 500 MG tablet TAKE 1 TABLET (500 MG TOTAL) BY MOUTH DAILY AS NEEDED.  30 tablet  0  . NEEDLE,  DISP, 25 G (MONOJECT MAGELLAN SAFETY NDL) 25G X 1" MISC by Does not apply route as directed.        . nystatin ointment (MYCOSTATIN) Apply 1 application topically daily as needed. Apply to red areas in groin      . omeprazole (PRILOSEC) 40 MG capsule TAKE 1 CAPSULE (40 MG TOTAL) BY MOUTH DAILY.  30 capsule  3  . potassium chloride (K-DUR) 10 MEQ tablet TAKE 2 TABLETS (20 MEQ TOTAL) BY MOUTH DAILY.  180 tablet  3  . ramipril (ALTACE) 10 MG capsule TAKE 1 CAPSULE (10 MG TOTAL) BY MOUTH DAILY.  90 capsule  1  . ranitidine (ZANTAC 75) 75 MG tablet Take 1 tablet (75 mg total) by mouth 2 (two) times daily.      Marland Kitchen rOPINIRole (REQUIP) 2 MG tablet Take 1 tablet (2 mg total) by mouth at bedtime.  90 tablet  3  . topiramate (TOPAMAX) 25 MG tablet Take 25 mg by mouth 2 (two) times daily.      Marland Kitchen triamcinolone cream (KENALOG) 0.1 % Apply 1 application topically 2 (two) times daily. Apply to AA on external ear, don't use more than 2 weeks at a time.  30 g  0  . Vilazodone HCl (VIIBRYD) 40 MG TABS Take 40 mg by mouth every morning.       No current facility-administered medications for this visit.    Allergies as of 01/08/2014 - Review Complete 01/08/2014  Allergen Reaction Noted  . Augmentin [amoxicillin-pot clavulanate] Diarrhea 06/25/2013  . Erythromycin base Nausea And Vomiting   . Neomycin      Vitals: BP 120/74  Pulse 74  Resp 17  Ht 5' 6.25" (1.683 m)  Wt 338 lb (153.316 kg)  BMI 54.13 kg/m2 Last Weight:  Wt Readings from Last 1 Encounters:  01/08/14 338 lb (153.316 kg)       Last Height:   Ht Readings from Last 1 Encounters:  01/08/14 5' 6.25" (1.683 m)    Physical exam:  General: The patient is awake, alert and appears not in acute distress. The patient is well groomed. Head: Normocephalic, atraumatic. Neck is supple. Mallampati 3.,  neck circumference:17  inches . Nasal airflow unrestricted  TMJ is evident . Retrognathia is seen.  Cardiovascular:  Regular rate and rhythm , without  murmurs or carotid bruit, and without distended neck veins. Respiratory: Lungs are clear to auscultation. Skin:  Without evidence of edema, or rash Trunk: BMI is 54- elevated and patient has normal posture.  Neurologic exam : The patient is awake and alert, oriented to place and time.   Memory subjective  described as intact.  There is a normal attention span & concentration ability. Speech is fluent without  dysarthria, dysphonia or aphasia. Mood and affect are appropriate.  Cranial nerves: Pupils are equal and briskly reactive to light.  Funduscopic exam without evidence of pallor or edema. Extraocular movements  in vertical and horizontal planes intact and without nystagmus. Visual fields by finger perimetry are intact. Hearing to finger rub intact.  Facial sensation intact to fine touch.  Facial motor strength is symmetric and tongue and uvula move midline.  Motor exam: Normal tone  and symmetric ,strength in all extremities.  Sensory:  Fine touch, pinprick and vibration were tested in all extremities. Proprioception is tested in the upper extremities only. This was normal.  Coordination: Rapid alternating movements in the fingers/hands is normal.  Finger-to-nose maneuver normal without evidence of ataxia, dysmetria or tremor.  Gait and  station: Patient walks without assistive device . Deep tendon reflexes: in the  upper and lower extremities are symmetric and intact.    Assessment:  After physical and neurologic examination, review of laboratory studies, imaging, neurophysiology testing and pre-existing records, assessment is  OSA , well treated on CPAP, last sleep study was in 2014 , nasal mask , resolution of apnea at 9 cm water.   The patient was advised of the nature of the diagnosed sleep disorder , the treatment options and risks for general a  health and wellness arising from not treating the condition. Visit duration was 30 minutes.  Her BMI is 54 and she likely has a component of obesity hypoventilation. This may explain her fatigue and sleepiness.   Plan:  Treatment plan and additional workup :  CPAP _Compliance is excellent ,  BMI - patient has made dietary changes. No longer drinking SODA, she lost not much weight.       Asencion Partridge Aleysia Oltmann MD  01/08/2014

## 2014-01-09 ENCOUNTER — Ambulatory Visit (INDEPENDENT_AMBULATORY_CARE_PROVIDER_SITE_OTHER): Payer: Medicare Other | Admitting: Family Medicine

## 2014-01-09 ENCOUNTER — Encounter: Payer: Self-pay | Admitting: Family Medicine

## 2014-01-09 VITALS — BP 134/78 | HR 66 | Temp 97.8°F | Ht 65.0 in | Wt 338.5 lb

## 2014-01-09 DIAGNOSIS — K52839 Microscopic colitis, unspecified: Secondary | ICD-10-CM

## 2014-01-09 DIAGNOSIS — E119 Type 2 diabetes mellitus without complications: Secondary | ICD-10-CM

## 2014-01-09 DIAGNOSIS — I5032 Chronic diastolic (congestive) heart failure: Secondary | ICD-10-CM

## 2014-01-09 DIAGNOSIS — E785 Hyperlipidemia, unspecified: Secondary | ICD-10-CM

## 2014-01-09 DIAGNOSIS — R197 Diarrhea, unspecified: Secondary | ICD-10-CM

## 2014-01-09 DIAGNOSIS — E039 Hypothyroidism, unspecified: Secondary | ICD-10-CM

## 2014-01-09 HISTORY — DX: Microscopic colitis, unspecified: K52.839

## 2014-01-09 LAB — BASIC METABOLIC PANEL
BUN: 13 mg/dL (ref 6–23)
CHLORIDE: 103 meq/L (ref 96–112)
CO2: 26 meq/L (ref 19–32)
Calcium: 9.1 mg/dL (ref 8.4–10.5)
Creatinine, Ser: 0.8 mg/dL (ref 0.4–1.2)
GFR: 78.46 mL/min (ref >60.00)
GLUCOSE: 121 mg/dL — AB (ref 70–99)
Potassium: 3.9 mEq/L (ref 3.5–5.1)
SODIUM: 140 meq/L (ref 135–145)

## 2014-01-09 LAB — CBC WITH DIFFERENTIAL/PLATELET
Basophils Absolute: 0 10*3/uL (ref 0.0–0.1)
Basophils Relative: 0.9 % (ref 0.0–3.0)
EOS ABS: 0.2 10*3/uL (ref 0.0–0.7)
Eosinophils Relative: 3.9 % (ref 0.0–5.0)
HCT: 44.3 % (ref 36.0–46.0)
Hemoglobin: 14.6 g/dL (ref 12.0–15.0)
LYMPHS PCT: 29.6 % (ref 12.0–46.0)
Lymphs Abs: 1.6 10*3/uL (ref 0.7–4.0)
MCHC: 32.9 g/dL (ref 30.0–36.0)
MCV: 90.1 fl (ref 78.0–100.0)
Monocytes Absolute: 0.3 10*3/uL (ref 0.1–1.0)
Monocytes Relative: 6.1 % (ref 3.0–12.0)
NEUTROS ABS: 3.3 10*3/uL (ref 1.4–7.7)
Neutrophils Relative %: 59.5 % (ref 43.0–77.0)
Platelets: 227 10*3/uL (ref 150.0–400.0)
RBC: 4.92 Mil/uL (ref 3.87–5.11)
RDW: 15.1 % (ref 11.5–15.5)
WBC: 5.6 10*3/uL (ref 4.0–10.5)

## 2014-01-09 LAB — HEPATIC FUNCTION PANEL
ALBUMIN: 4 g/dL (ref 3.5–5.2)
ALT: 25 U/L (ref 0–35)
AST: 23 U/L (ref 0–37)
Alkaline Phosphatase: 72 U/L (ref 39–117)
Bilirubin, Direct: 0.1 mg/dL (ref 0.0–0.3)
TOTAL PROTEIN: 7.4 g/dL (ref 6.0–8.3)
Total Bilirubin: 0.7 mg/dL (ref 0.2–1.2)

## 2014-01-09 LAB — LIPID PANEL
CHOLESTEROL: 231 mg/dL — AB (ref 0–200)
HDL: 59.6 mg/dL (ref >39.00)
LDL Cholesterol: 142 mg/dL — ABNORMAL HIGH (ref 0–99)
NonHDL: 171.4
TRIGLYCERIDES: 148 mg/dL (ref 0.0–149.0)
Total CHOL/HDL Ratio: 4
VLDL: 29.6 mg/dL (ref 0.0–40.0)

## 2014-01-09 LAB — TSH: TSH: 3.13 u[IU]/mL (ref 0.35–4.50)

## 2014-01-09 LAB — HEMOGLOBIN A1C: Hgb A1c MFr Bld: 7.1 % — ABNORMAL HIGH (ref 4.6–6.5)

## 2014-01-09 NOTE — Assessment & Plan Note (Signed)
Discussed weight gain noted. Body mass index is 56.33 kg/(m^2). Pt will call [place] and inquire about medical nutrition weight loss program with meal plan.

## 2014-01-09 NOTE — Assessment & Plan Note (Signed)
Compliant with lipitor 40mg  daily. Check FLP today as fasting.

## 2014-01-09 NOTE — Assessment & Plan Note (Addendum)
Chronic, previously controlled on 1 a day metformin but given noted weight gain concern for loss of control.  Foot exam today. A1c today. Encouraged she f/u in 5 mo.

## 2014-01-09 NOTE — Assessment & Plan Note (Signed)
Stable without dyspnea. Reviewed weight gain with patient.

## 2014-01-09 NOTE — Patient Instructions (Addendum)
Blood work today. Stool studies today.  Check into medical weight loss program at [facility] and let me know if you need referral from [place]. I think this is a good idea. I'd like to set you up with GI to discuss diarrhea and colon cancer screening. Pass by marion's office to set this up. Return in 5 months for follow up

## 2014-01-09 NOTE — Progress Notes (Signed)
Pre visit review using our clinic review tool, if applicable. No additional management support is needed unless otherwise documented below in the visit note. 

## 2014-01-09 NOTE — Progress Notes (Signed)
BP 134/78  Pulse 66  Temp(Src) 97.8 F (36.6 C) (Oral)  Ht 5\' 5"  (1.651 m)  Wt 338 lb 8 oz (153.543 kg)  BMI 56.33 kg/m2  SpO2 95%   CC: DM bundle visit also with multiple issues   Subjective:    Patient ID: [name], female    DOB: 08-31-1950, 63 y.o.   MRN: [id number]  HPI: [name] is a 63 y.o. female presenting on 01/09/2014 for diabetic bundle, Abdominal Pain and Diarrhea   Not seen for 7 months. Here for DM bundle, but she also has had a several month history of sudden episodes of stool urgency initially attributed to IBS. Over last month, having weekly episodes of sudden urgency at times with bowel incontinence.  Endorses watery diarrhea with cramping abdominal pain and nausea. Denies fevers/chills, blood in stool, vomiting. No recent travel, no new foods. Uses city water. No sick contacts at home.  iFOB sent 04/2013 but we never received back. Pt states she mailed this in.  DM - compliant with metformin 500mg  daily. Does not check sugars. Weight gain noted. Eye exam 02/2013. Foot exam today.  Lab Results  Component Value Date   HGBA1C 6.5 04/15/2013    dCHF - compliant with meds (ramipril and lasix).  HLD - lipitor 40mg  daily without myalgias  Wt Readings from Last 3 Encounters:  01/09/14 338 lb 8 oz (153.543 kg)  01/08/14 338 lb (153.316 kg)  08/07/13 331 lb (150.141 kg)  Body mass index is 56.33 kg/(m^2). Asks about nutritional medical weight loss program at [facility].  Relevant past medical, surgical, family and social history reviewed and updated as indicated.  Allergies and medications reviewed and updated. Current Outpatient Prescriptions on File Prior to Visit  Medication Sig  . ALPRAZolam (XANAX) 0.25 MG tablet Take 0.25 mg by mouth daily. For anxiety  . aspirin 81 MG tablet Take 81 mg by mouth every other day.   Marland Kitchen atorvastatin (LIPITOR) 40 MG tablet TAKE 1 TABLET (40 MG TOTAL) BY MOUTH AT BEDTIME.  . cyanocobalamin (,VITAMIN B-12,) 1000 MCG/ML  injection Inject 1 mL (1,000 mcg total) into the muscle every 30 (thirty) days.  . furosemide (LASIX) 40 MG tablet TAKE 1 TABLET BY MOUTH EVERY DAY  . gabapentin (NEURONTIN) 600 MG tablet Take 1,200 mg by mouth at bedtime.  . lamoTRIgine (LAMICTAL) 200 MG tablet Take 200 mg by mouth daily.  Marland Kitchen levothyroxine (SYNTHROID) 112 MCG tablet Take 2 tablets (224 mcg total) by mouth daily before breakfast.  . metFORMIN (GLUCOPHAGE) 500 MG tablet TAKE 1 TABLET (500 MG TOTAL) BY MOUTH DAILY WITH BREAKFAST.  . naproxen (NAPROSYN) 500 MG tablet TAKE 1 TABLET (500 MG TOTAL) BY MOUTH DAILY AS NEEDED.  Marland Kitchen NEEDLE, DISP, 25 G (MONOJECT MAGELLAN SAFETY NDL) 25G X 1" MISC by Does not apply route as directed.    . nystatin ointment (MYCOSTATIN) Apply 1 application topically daily as needed. Apply to red areas in groin  . omeprazole (PRILOSEC) 40 MG capsule TAKE 1 CAPSULE (40 MG TOTAL) BY MOUTH DAILY.  Marland Kitchen potassium chloride (K-DUR) 10 MEQ tablet TAKE 2 TABLETS (20 MEQ TOTAL) BY MOUTH DAILY.  . ramipril (ALTACE) 10 MG capsule TAKE 1 CAPSULE (10 MG TOTAL) BY MOUTH DAILY.  . ranitidine (ZANTAC 75) 75 MG tablet Take 1 tablet (75 mg total) by mouth 2 (two) times daily.  Marland Kitchen rOPINIRole (REQUIP) 2 MG tablet Take 1 tablet (2 mg total) by mouth at bedtime.  . topiramate (TOPAMAX) 25 MG  tablet Take 1 tablet (25 mg total) by mouth 2 (two) times daily.  Marland Kitchen triamcinolone cream (KENALOG) 0.1 % Apply 1 application topically 2 (two) times daily. Apply to AA on external ear, don't use more than 2 weeks at a time.  . Vilazodone HCl (VIIBRYD) 40 MG TABS Take 40 mg by mouth every morning.   No current facility-administered medications on file prior to visit.    Review of Systems Per HPI unless specifically indicated above    Objective:    BP 134/78  Pulse 66  Temp(Src) 97.8 F (36.6 C) (Oral)  Ht 5\' 5"  (1.651 m)  Wt 338 lb 8 oz (153.543 kg)  BMI 56.33 kg/m2  SpO2 95%  Physical Exam  Nursing note and vitals  reviewed. Constitutional: She appears well-developed and well-nourished. No distress.  HENT:  Mouth/Throat: Oropharynx is clear and moist. No oropharyngeal exudate.  Eyes: Conjunctivae and EOM are normal. Pupils are equal, round, and reactive to light. No scleral icterus.  Cardiovascular: Normal rate, regular rhythm, normal heart sounds and intact distal pulses.   No murmur heard. Pulmonary/Chest: Effort normal and breath sounds normal. No respiratory distress. She has no wheezes. She has no rales.  Abdominal: Soft. Bowel sounds are normal. She exhibits no distension and no mass. There is no tenderness. There is no rebound and no guarding.  Morbidly obese  Musculoskeletal: She exhibits no edema.  Diabetic foot exam: Normal inspection No skin breakdown No calluses  Normal DP pulses Normal sensation to light touch and monofilament Nails normal, R great toenail absent   Results for orders placed in visit on 09/24/13  TSH      Result Value Ref Range   TSH 3.15  0.35 - 4.50 uIU/mL      Assessment & Plan:   Problem List Items Addressed This Visit   Chronic diastolic heart failure     Stable without dyspnea. Reviewed weight gain with patient.    DIABETES MELLITUS, TYPE II - Primary     Chronic, previously controlled on 1 a day metformin but given noted weight gain concern for loss of control.  Foot exam today. A1c today. Encouraged she f/u in 5 mo.    Relevant Orders      HM DIABETES FOOT EXAM (Completed)      Hemoglobin A1c      Basic metabolic panel   Diarrhea     Several months of ongoing diarrhea which patient initially attributed to IBS but over the past month has noticed increased frequency and severity of episodes. Bowel changes, has not had colon cancer screening other than normal iFOB 11/2011 - we never received iFOB from last year. Will check for infectious causes today with c diff and stool studies. Check iFOB today. Will also check LFTs, CBC. Discussed referral to GI  for further evaluation of persistent diarrhea and discussion of colon cancer screening.    Relevant Orders      Hepatic function panel      Stool culture      C. difficile GDH and Toxin A/B      CBC with Differential      Ambulatory referral to Gastroenterology      Fecal occult blood, imunochemical   HYPERLIPIDEMIA-MIXED     Compliant with lipitor 40mg  daily. Check FLP today as fasting.    Relevant Orders      Lipid panel   HYPOTHYROIDISM   Relevant Orders      TSH   OBESITY, MORBID  Discussed weight gain noted. Body mass index is 56.33 kg/(m^2). Pt will call [place] and inquire about medical nutrition weight loss program with meal plan.        Follow up plan: Return in about 5 months (around 06/11/2014), or if symptoms worsen or fail to improve, for follow up visit.

## 2014-01-09 NOTE — Assessment & Plan Note (Signed)
Several months of ongoing diarrhea which patient initially attributed to IBS but over the past month has noticed increased frequency and severity of episodes. Bowel changes, has not had colon cancer screening other than normal iFOB 11/2011 - we never received iFOB from last year. Will check for infectious causes today with c diff and stool studies. Check iFOB today. Will also check LFTs, CBC. Discussed referral to GI for further evaluation of persistent diarrhea and discussion of colon cancer screening.

## 2014-01-10 ENCOUNTER — Other Ambulatory Visit: Payer: Self-pay | Admitting: Family Medicine

## 2014-01-10 LAB — C. DIFFICILE GDH AND TOXIN A/B
C. difficile GDH: DETECTED — AB
C. difficile Toxin A/B: NOT DETECTED

## 2014-01-10 LAB — CLOSTRIDIUM DIFFICILE BY PCR: CDIFFPCR: DETECTED — AB

## 2014-01-10 MED ORDER — METRONIDAZOLE 500 MG PO TABS: 500.0000 mg | ORAL_TABLET | Freq: Three times a day (TID) | ORAL | Status: DC

## 2014-01-12 ENCOUNTER — Telehealth: Payer: Self-pay | Admitting: Radiology

## 2014-01-12 ENCOUNTER — Other Ambulatory Visit: Payer: Medicare Other

## 2014-01-12 ENCOUNTER — Encounter: Payer: Self-pay | Admitting: *Deleted

## 2014-01-12 ENCOUNTER — Encounter: Payer: Self-pay | Admitting: Internal Medicine

## 2014-01-12 DIAGNOSIS — R197 Diarrhea, unspecified: Secondary | ICD-10-CM

## 2014-01-12 LAB — FECAL OCCULT BLOOD, GUAIAC: FECAL OCCULT BLD: NEGATIVE

## 2014-01-12 LAB — FECAL OCCULT BLOOD, IMMUNOCHEMICAL: Fecal Occult Bld: NEGATIVE

## 2014-01-12 NOTE — Telephone Encounter (Signed)
Noted. See results note.  

## 2014-01-12 NOTE — Telephone Encounter (Signed)
[facility] called a critical result, C DIFF - POSITIVE. Results given to Dr Danise Mina

## 2014-01-12 NOTE — Telephone Encounter (Signed)
Patient notified

## 2014-01-13 LAB — STOOL CULTURE

## 2014-01-15 ENCOUNTER — Encounter: Payer: Self-pay | Admitting: Family Medicine

## 2014-01-15 MED ORDER — ONDANSETRON HCL 4 MG PO TABS: 4.0000 mg | ORAL_TABLET | Freq: Three times a day (TID) | ORAL | Status: DC | PRN

## 2014-01-15 NOTE — Telephone Encounter (Signed)
Sent in zofran - plz notify pt. Any fever and is she able to keep liquids down? I would give abx more time and have her update [place] tomorrow after she tries nausea medication. Sent to [facility].

## 2014-01-15 NOTE — Telephone Encounter (Signed)
Patient denies fever and is able to keep liquids down. She will call tomorrow with an update.

## 2014-01-15 NOTE — Telephone Encounter (Signed)
See Mychart message from patient.

## 2014-01-16 ENCOUNTER — Encounter: Payer: Self-pay | Admitting: Family Medicine

## 2014-01-16 NOTE — Telephone Encounter (Signed)
Please see Mychart message.

## 2014-01-20 ENCOUNTER — Ambulatory Visit: Payer: Medicare Other | Admitting: Family Medicine

## 2014-01-23 ENCOUNTER — Ambulatory Visit: Payer: Medicare Other | Admitting: Family Medicine

## 2014-01-25 ENCOUNTER — Encounter: Payer: Self-pay | Admitting: Family Medicine

## 2014-01-27 ENCOUNTER — Telehealth: Payer: Self-pay

## 2014-01-27 MED ORDER — METRONIDAZOLE 500 MG PO TABS: 500.0000 mg | ORAL_TABLET | Freq: Three times a day (TID) | ORAL | Status: DC

## 2014-01-27 NOTE — Telephone Encounter (Signed)
Let's extend treatment one more week. Sent new med to pharmacy Rpt testing not indicated as will not tell us if has cleared infection as positive response may remain several weeks after treatment.

## 2014-01-27 NOTE — Telephone Encounter (Signed)
Pt left v/m; pt finished antibiotic today and pt wants to know if she needs to be retested for c diff and how contagious pt is now since finished antibiotic. [facility] spoke with pt and pt is much better but last night had loose stool and stomach pain; pain level 1. Pt [place] with [place] group of ladies and pt wants to know if contagious.pt would feel better if could be retested.pt request cb.

## 2014-01-28 NOTE — Telephone Encounter (Signed)
Left detailed message on voicemail per HIPAA letting pt know information as instructed

## 2014-01-31 ENCOUNTER — Ambulatory Visit: Payer: Self-pay | Admitting: Family Medicine

## 2014-02-02 ENCOUNTER — Ambulatory Visit: Payer: Medicare Other | Admitting: Cardiovascular Disease

## 2014-02-02 ENCOUNTER — Telehealth: Payer: Self-pay | Admitting: Family Medicine

## 2014-02-02 NOTE — Telephone Encounter (Signed)
Spoke to pt who states that she is no longer having s/s. States that she is taking something that she "got from the drugstore."

## 2014-02-02 NOTE — Telephone Encounter (Signed)
Pt called over weekend with UTI sxs - plz call to f/u and if persistent sxs rec office visit to evaluate.

## 2014-02-13 ENCOUNTER — Encounter: Payer: Self-pay | Admitting: Family Medicine

## 2014-02-13 ENCOUNTER — Ambulatory Visit (INDEPENDENT_AMBULATORY_CARE_PROVIDER_SITE_OTHER): Payer: Medicare Other | Admitting: Family Medicine

## 2014-02-13 VITALS — BP 132/78 | HR 74 | Temp 98.1°F | Wt 334.5 lb

## 2014-02-13 DIAGNOSIS — E538 Deficiency of other specified B group vitamins: Secondary | ICD-10-CM

## 2014-02-13 DIAGNOSIS — A0472 Enterocolitis due to Clostridium difficile, not specified as recurrent: Secondary | ICD-10-CM

## 2014-02-13 DIAGNOSIS — E1165 Type 2 diabetes mellitus with hyperglycemia: Secondary | ICD-10-CM

## 2014-02-13 DIAGNOSIS — IMO0002 Reserved for concepts with insufficient information to code with codable children: Secondary | ICD-10-CM

## 2014-02-13 DIAGNOSIS — R300 Dysuria: Secondary | ICD-10-CM

## 2014-02-13 DIAGNOSIS — A047 Enterocolitis due to Clostridium difficile: Secondary | ICD-10-CM

## 2014-02-13 DIAGNOSIS — Z23 Encounter for immunization: Secondary | ICD-10-CM

## 2014-02-13 HISTORY — DX: Enterocolitis due to Clostridium difficile, not specified as recurrent: A04.72

## 2014-02-13 LAB — POCT URINALYSIS DIPSTICK
Bilirubin, UA: NEGATIVE
Blood, UA: NEGATIVE
Glucose, UA: NEGATIVE
Ketones, UA: NEGATIVE
Leukocytes, UA: NEGATIVE
Nitrite, UA: NEGATIVE
PH UA: 6
PROTEIN UA: POSITIVE
UROBILINOGEN UA: 0.2

## 2014-02-13 MED ORDER — CYANOCOBALAMIN 1000 MCG/ML IJ SOLN
1000.0000 ug | Freq: Once | INTRAMUSCULAR | Status: AC
  Administered 2014-02-13: 1000 ug via INTRAMUSCULAR

## 2014-02-13 MED ORDER — METRONIDAZOLE 500 MG PO TABS: 500.0000 mg | ORAL_TABLET | Freq: Three times a day (TID) | ORAL | Status: DC

## 2014-02-13 NOTE — Patient Instructions (Addendum)
Flu shot today. b12 shot today. Urine checked today - we will call you with results. Handicap placard provided today. Pass by [facility]'s office for referral to GI - I'd like you to see an extender sooner than November. Another 1 wk course of flagyl then start danactive probiotic for next 3 weeks while you get in to see GI.

## 2014-02-13 NOTE — Progress Notes (Signed)
Pre visit review using our clinic review tool, if applicable. No additional management support is needed unless otherwise documented below in the visit note. 

## 2014-02-13 NOTE — Assessment & Plan Note (Addendum)
Check UA today - will call pt with results. UA - contaminated, concentrated. UCx not sent Will monitor sxs for now, if recurrent low threshold to start abx course.

## 2014-02-13 NOTE — Assessment & Plan Note (Signed)
Reports closely monitoring diet over last month. Anticipate improved control when A1c next due. Advised check sugars at home more regularly (currently does not check).

## 2014-02-13 NOTE — Assessment & Plan Note (Addendum)
Has completed 3 wk course of flagyl. Sounds to have recurrence of sxs when abx are discontinued. Will do another 1 wk course of flagyl, and start lactobacillus probiotic actimel or danactiv. Will also refer to GI for sooner eval for recurrent C diff colitis as well as episodes of stool incontinence.

## 2014-02-13 NOTE — Progress Notes (Addendum)
BP 132/78  Pulse 74  Temp(Src) 98.1 F (36.7 C) (Oral)  Wt 334 lb 8 oz (151.728 kg)  SpO2 92%   CC: f/u, abd pain  Subjective:    Patient ID: [name], female    DOB: 1951-03-07, 63 y.o.   MRN: [id number]  HPI: [name] is a 63 y.o. female presenting on 02/13/2014 for Follow-up and Abdominal Pain   Seen last month with dx C diff colitis, completed 2 wk course flagyl tid then extended a third week 2/2 persistent diarrhea. Diarrhea and abd pain have significantly improved, but having persistent episodes of diarrhea with incontinence. Smell has improved. Still with some mucous in stool. Some upper abd cramping.   No fevers/chills.  Did have dysuria, urgency and frequency a few weeks ago, self treated with azo and OTC yeast.   Has appt with GI for 11/24 Carlean Purl).   DM - has started watching diet more closely, doesn't check sugars regularly.  Lab Results  Component Value Date   HGBA1C 7.1* 01/09/2014    Wt Readings from Last 3 Encounters:  02/13/14 334 lb 8 oz (151.728 kg)  01/09/14 338 lb 8 oz (153.543 kg)  01/08/14 338 lb (153.316 kg)   Body mass index is 55.66 kg/(m^2).   Arthritis worsening in knees - would like handicap placard - difficulty walking 200 feet. To restart naprosyn. L knee locks in place. R>L knee. Tylenol doesn't help as much as naprosyn. At times uses cane to walk. Lab Results  Component Value Date   CREATININE 0.8 01/09/2014    Relevant past medical, surgical, family and social history reviewed and updated as indicated.  Allergies and medications reviewed and updated. Current Outpatient Prescriptions on File Prior to Visit  Medication Sig  . ALPRAZolam (XANAX) 0.25 MG tablet Take 0.25 mg by mouth daily. For anxiety  . aspirin 81 MG tablet Take 81 mg by mouth every other day.   Marland Kitchen atorvastatin (LIPITOR) 40 MG tablet TAKE 1 TABLET (40 MG TOTAL) BY MOUTH AT BEDTIME.  . cyanocobalamin (,VITAMIN B-12,) 1000 MCG/ML injection Inject 1 mL  (1,000 mcg total) into the muscle every 30 (thirty) days.  . furosemide (LASIX) 40 MG tablet TAKE 1 TABLET BY MOUTH EVERY DAY  . gabapentin (NEURONTIN) 600 MG tablet Take 1,200 mg by mouth at bedtime.  . lamoTRIgine (LAMICTAL) 200 MG tablet Take 200 mg by mouth daily.  Marland Kitchen levothyroxine (SYNTHROID) 112 MCG tablet Take 2 tablets (224 mcg total) by mouth daily before breakfast.  . metFORMIN (GLUCOPHAGE) 500 MG tablet TAKE 1 TABLET (500 MG TOTAL) BY MOUTH DAILY WITH BREAKFAST.  . naproxen (NAPROSYN) 500 MG tablet TAKE 1 TABLET (500 MG TOTAL) BY MOUTH DAILY AS NEEDED.  Marland Kitchen NEEDLE, DISP, 25 G (MONOJECT MAGELLAN SAFETY NDL) 25G X 1" MISC by Does not apply route as directed.    . nystatin ointment (MYCOSTATIN) Apply 1 application topically daily as needed. Apply to red areas in groin  . omeprazole (PRILOSEC) 40 MG capsule TAKE 1 CAPSULE (40 MG TOTAL) BY MOUTH DAILY.  Marland Kitchen ondansetron (ZOFRAN) 4 MG tablet Take 1 tablet (4 mg total) by mouth every 8 (eight) hours as needed for nausea or vomiting.  . potassium chloride (K-DUR) 10 MEQ tablet TAKE 2 TABLETS (20 MEQ TOTAL) BY MOUTH DAILY.  . ramipril (ALTACE) 10 MG capsule TAKE 1 CAPSULE (10 MG TOTAL) BY MOUTH DAILY.  . ranitidine (ZANTAC 75) 75 MG tablet Take 1 tablet (75 mg total) by mouth 2 (two) times  daily.  . rOPINIRole (REQUIP) 2 MG tablet Take 1 tablet (2 mg total) by mouth at bedtime.  . topiramate (TOPAMAX) 25 MG tablet Take 1 tablet (25 mg total) by mouth 2 (two) times daily.  Marland Kitchen triamcinolone cream (KENALOG) 0.1 % Apply 1 application topically 2 (two) times daily. Apply to AA on external ear, don't use more than 2 weeks at a time.  . Vilazodone HCl (VIIBRYD) 40 MG TABS Take 40 mg by mouth every morning.   No current facility-administered medications on file prior to visit.    Review of Systems Per HPI unless specifically indicated above    Objective:    BP 132/78  Pulse 74  Temp(Src) 98.1 F (36.7 C) (Oral)  Wt 334 lb 8 oz (151.728 kg)  SpO2  92%  Physical Exam  Nursing note and vitals reviewed. Constitutional: She appears well-developed and well-nourished. No distress.  Morbidly obese  HENT:  Mouth/Throat: Oropharynx is clear and moist. No oropharyngeal exudate.  Cardiovascular: Normal rate, regular rhythm, normal heart sounds and intact distal pulses.   No murmur heard. Pulmonary/Chest: Effort normal and breath sounds normal. No respiratory distress. She has no wheezes. She has no rales.  Abdominal: Soft. Normal appearance and bowel sounds are normal. She exhibits no distension and no mass. There is no hepatosplenomegaly. There is no tenderness. There is no rigidity, no rebound, no guarding, no CVA tenderness and negative Murphy's sign.  Musculoskeletal: She exhibits no edema.       Assessment & Plan:   Problem List Items Addressed This Visit   Dysuria     Check UA today - will call pt with results. UA - contaminated, concentrated. UCx not sent Will monitor sxs for now, if recurrent low threshold to start abx course.    Relevant Orders      Urinalysis Dipstick (Completed)   Diabetes mellitus type 2, uncontrolled     Reports closely monitoring diet over last month. Anticipate improved control when A1c next due. Advised check sugars at home more regularly (currently does not check).    C. difficile colitis - Primary     Has completed 3 wk course of flagyl. Sounds to have recurrence of sxs when abx are discontinued. Will do another 1 wk course of flagyl, and start lactobacillus probiotic actimel or danactiv. Will also refer to GI for sooner eval for recurrent C diff colitis as well as episodes of stool incontinence.    Relevant Medications      metroNIDAZOLE (FLAGYL) tablet   B12 deficiency     b12 shot today.    Relevant Medications      cyanocobalamin ((VITAMIN B-12)) injection 1,000 mcg (Completed)    Other Visit Diagnoses   Need for prophylactic vaccination and inoculation against influenza        Relevant  Orders       Flu Vaccine QUAD 36+ mos PF IM (Fluarix Quad PF) (Completed)        Follow up plan: Return if symptoms worsen or fail to improve.

## 2014-02-13 NOTE — Assessment & Plan Note (Signed)
b12 shot today. 

## 2014-02-19 ENCOUNTER — Ambulatory Visit: Payer: Medicare Other | Admitting: Cardiovascular Disease

## 2014-02-19 ENCOUNTER — Encounter: Payer: Self-pay | Admitting: *Deleted

## 2014-02-20 ENCOUNTER — Ambulatory Visit (INDEPENDENT_AMBULATORY_CARE_PROVIDER_SITE_OTHER): Payer: Medicare Other | Admitting: Gastroenterology

## 2014-02-20 ENCOUNTER — Encounter: Payer: Self-pay | Admitting: Gastroenterology

## 2014-02-20 VITALS — BP 116/68 | HR 68 | Ht 65.5 in | Wt 333.5 lb

## 2014-02-20 DIAGNOSIS — A0472 Enterocolitis due to Clostridium difficile, not specified as recurrent: Secondary | ICD-10-CM

## 2014-02-20 DIAGNOSIS — R197 Diarrhea, unspecified: Secondary | ICD-10-CM

## 2014-02-20 DIAGNOSIS — A047 Enterocolitis due to Clostridium difficile: Secondary | ICD-10-CM

## 2014-02-20 NOTE — Patient Instructions (Addendum)
It has been recommended to you by your physician assitant that you have a(n) Colonoscopy completed at the hospital with Dr. Carlean Purl. Per your request, we did not schedule the procedure(s) today. Please contact our office at [phone] should you decide to have the procedure completed.  Please purchase Florastor over the counter and take twice daily   Your physician has requested that you go to the basement for the following lab work: Clinical research associate

## 2014-02-20 NOTE — Progress Notes (Signed)
02/20/2014 [name] [id number] 26-Feb-1951   HISTORY OF PRESENT ILLNESS:  This is a 63 year old female who is previously known to Dr. Carlean Purl, last seen in 2012. She says that she has been here on 3 other occasions to schedule a colonoscopy, but she has been unable to drink the preps due to a bad gag reflex. Therefore, she's never and colonoscopy in the past. She has past medical history of anxiety and depression, hyperlipidemia, hypothyroidism, coronary artery disease, morbid obesity, type 2 diabetes mellitus, CHF, restless leg syndrome, arthritis, GERD, hypertension, obstructive sleep apnea.  She is here today to rediscuss colonoscopy and see if there are any other alternatives to bowel prep. Also, she needs to discuss her recent history of diarrhea. She was initially diagnosed with C. difficile on August 28. Since that time she has been through 3 courses of treatment with Flagyl by her PCP. She's not had any stool rechecks for C. difficile. Initially she took a two-week course of Flagyl 500 mg 3 times daily. Several days later she was given another course for one more week, and now she is finishing her third course of the Flagyl today, which was also seven-day course. She says that a few days after being off antibiotics the diarrhea returns. Initially it was very watery, and now it has not been as severe, but there is still urgency and she is still having some accident. She denies any blood in her stool.  She does have some mild diffuse abdominal pains.  Past Medical History  Diagnosis Date  . Vitamin B 12 deficiency   . Anxiety   . Depression   . Hyperlipidemia   . Hypothyroidism   . CAD (coronary artery disease)     nonobstructive plaque by cath 2013  . Morbid obesity     seen dietician 09/2011  . T2DM (type 2 diabetes mellitus)   . Vitamin D deficiency     last check 47 (11/2010)  . Fibula fracture   . Fatty liver 2006    [place]  . 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 Chronic diastolic CHF (congestive heart failure) 08/2011    EF 55-60%, nl valves  . RLS (restless legs syndrome)   . Arthritis   . GERD (gastroesophageal reflux disease)   . Hypertension   . Complication of anesthesia     hard time waking up 02 sats 85-89% range   . OSA (obstructive sleep apnea)     medium quattro full face mask CPAP 10cm  . 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 Hypersomnia with sleep apnea, unspecified 01/08/2014   Past Surgical History  Procedure Laterality Date  . Osteotomy and ulnar shortening  2001    [name]  . Knee surgery Left 11-02-2008    Partial medial and lateral Menisectomies and Condroplasty (Dr. [name])  . Cardiac catheterization  10/12/09    EF 70%  . Mr abdomen  01/16/2011    Left 4cm adrenal myelolipoma  . Cardiac catheterization  08/23/2011    Mild nonobstructive CAD, normal LV fxn  . US echocardiography  08/2011    EF of 55-60% and indeterminate diastolic function  . Esophagogastroduodenoscopy  2006  . Ventral hernia repair  03/01/2012    Procedure: LAPAROSCOPIC VENTRAL HERNIA;  Surgeon: Ralene Ok, MD;  Location: [facility];  Service: General;  Laterality: N/A;    reports that she quit smoking about 16 years ago. She has never  used smokeless tobacco. She reports that she does not drink alcohol or use illicit drugs. family history includes Coronary artery disease (age of onset: 28) in her mother; Diabetes in her mother; Heart attack (age of onset: 63) in her brother; Heart attack (age of onset: 64) in her father; Stroke in her mother. There is no history of Colon cancer, Colon polyps, Esophageal cancer, or Kidney disease. Allergies  Allergen Reactions  . Augmentin [Amoxicillin-Pot Clavulanate] Diarrhea    Watery diarrhea and abd pain  . Erythromycin Base Nausea And Vomiting  . Neomycin     vomiting       Outpatient Encounter Prescriptions as of 02/20/2014  Medication Sig  . ALPRAZolam (XANAX) 0.25 MG tablet Take 0.25 mg  by mouth daily. For anxiety  . aspirin 81 MG tablet Take 81 mg by mouth every other day.   Marland Kitchen atorvastatin (LIPITOR) 40 MG tablet TAKE 1 TABLET (40 MG TOTAL) BY MOUTH AT BEDTIME.  . cyanocobalamin (,VITAMIN B-12,) 1000 MCG/ML injection Inject 1 mL (1,000 mcg total) into the muscle every 30 (thirty) days.  . furosemide (LASIX) 40 MG tablet TAKE 1 TABLET BY MOUTH EVERY DAY  . gabapentin (NEURONTIN) 600 MG tablet Take 1,200 mg by mouth at bedtime.  . lamoTRIgine (LAMICTAL) 200 MG tablet Take 200 mg by mouth daily.  Marland Kitchen levothyroxine (SYNTHROID) 112 MCG tablet Take 2 tablets (224 mcg total) by mouth daily before breakfast.  . metFORMIN (GLUCOPHAGE) 500 MG tablet TAKE 1 TABLET (500 MG TOTAL) BY MOUTH DAILY WITH BREAKFAST.  Marland Kitchen metroNIDAZOLE (FLAGYL) 500 MG tablet Take 1 tablet (500 mg total) by mouth 3 (three) times daily.  . naproxen (NAPROSYN) 500 MG tablet TAKE 1 TABLET (500 MG TOTAL) BY MOUTH DAILY AS NEEDED.  Marland Kitchen NEEDLE, DISP, 25 G (MONOJECT MAGELLAN SAFETY NDL) 25G X 1" MISC by Does not apply route as directed.    . nystatin ointment (MYCOSTATIN) Apply 1 application topically daily as needed. Apply to red areas in groin  . omeprazole (PRILOSEC) 40 MG capsule TAKE 1 CAPSULE (40 MG TOTAL) BY MOUTH DAILY.  Marland Kitchen ondansetron (ZOFRAN) 4 MG tablet Take 1 tablet (4 mg total) by mouth every 8 (eight) hours as needed for nausea or vomiting.  . potassium chloride (K-DUR) 10 MEQ tablet TAKE 2 TABLETS (20 MEQ TOTAL) BY MOUTH DAILY.  . ramipril (ALTACE) 10 MG capsule TAKE 1 CAPSULE (10 MG TOTAL) BY MOUTH DAILY.  . ranitidine (ZANTAC 75) 75 MG tablet Take 1 tablet (75 mg total) by mouth 2 (two) times daily.  Marland Kitchen rOPINIRole (REQUIP) 2 MG tablet Take 1 tablet (2 mg total) by mouth at bedtime.  . topiramate (TOPAMAX) 25 MG tablet Take 1 tablet (25 mg total) by mouth 2 (two) times daily.  Marland Kitchen triamcinolone cream (KENALOG) 0.1 % Apply 1 application topically 2 (two) times daily. Apply to AA on external ear, don't use more than 2  weeks at a time.  . Vilazodone HCl (VIIBRYD) 40 MG TABS Take 40 mg by mouth every morning.     REVIEW OF SYSTEMS  : All other systems reviewed and negative except where noted in the History of Present Illness.   PHYSICAL EXAM: BP 116/68  Pulse 68  Ht 5' 5.5" (1.664 m)  Wt 333 lb 8 oz (151.275 kg)  BMI 54.63 kg/m2 General: Well developed, morbidly obese, white female in no acute distress Head: Normocephalic and atraumatic Eyes:  Sclerae anicteric, conjunctiva pink. Ears: Normal auditory acuity Lungs: Clear throughout to auscultation Heart: Regular rate  and rhythm Abdomen: Soft, non-distended.  Normal bowel sounds.  Mild diffuse TTP without R/R/G. Rectal:  Deferred.  Will be done at the time of colonoscopy. Musculoskeletal: Symmetrical with no gross deformities  Skin: No lesions on visible extremities Extremities: No edema  Neurological: Alert oriented x 4, grossly non-focal Psychological:  Alert and cooperative. Normal mood and affect  ASSESSMENT AND PLAN: -Diarrhea:  Secondary to Cdiff.  Has taken 3 courses of flagyl (just finishing third course today).  Will have her start florastor twice daily.  If diarrhea returns then need to recheck stool for Cdiff before re-treating.  If still positive then will need treatment with vancomycin. May need to consider discontinuing her PPI as well if this is going to be ongoing or recurrent issue. -Screening colonoscopy:  Will scheduled colonoscopy several weeks out from now with Dr. Carlean Purl.  The risks, benefits, and alternatives were discussed with the patient and she consents to proceed.  Patient complaining about bowel preps, saying that she has a terrible gag reflex and that she has tried three other preps in the past.  Will give her Miralax/ducolax prep that she can mix with her Crystal Light lemonade.

## 2014-02-21 ENCOUNTER — Other Ambulatory Visit: Payer: Self-pay | Admitting: Family Medicine

## 2014-02-25 NOTE — Progress Notes (Signed)
Agree with [name]'s management.  Caitlyn Level E. Kenae Lindquist, MD, FACG  

## 2014-03-11 ENCOUNTER — Ambulatory Visit (HOSPITAL_COMMUNITY): Admit: 2014-03-11 | Payer: Self-pay | Admitting: Internal Medicine

## 2014-03-11 ENCOUNTER — Encounter (HOSPITAL_COMMUNITY): Payer: Self-pay

## 2014-03-11 SURGERY — COLONOSCOPY
Anesthesia: Moderate Sedation

## 2014-03-12 ENCOUNTER — Encounter: Payer: Self-pay | Admitting: Internal Medicine

## 2014-03-12 ENCOUNTER — Ambulatory Visit (INDEPENDENT_AMBULATORY_CARE_PROVIDER_SITE_OTHER): Payer: Medicare Other | Admitting: Internal Medicine

## 2014-03-12 ENCOUNTER — Telehealth: Payer: Self-pay

## 2014-03-12 VITALS — BP 100/70 | HR 83 | Temp 98.2°F | Resp 12 | Wt 336.0 lb

## 2014-03-12 DIAGNOSIS — S5001XA Contusion of right elbow, initial encounter: Secondary | ICD-10-CM

## 2014-03-12 NOTE — Patient Instructions (Signed)
Use warm moist compresses 4 times a day to the affected area. Keep it elevated as much as possible. Please report warning signs as we discussed. Worrisome would be change in color or size, increased pain, fever, red streaks up the arm or pus production.

## 2014-03-12 NOTE — Progress Notes (Signed)
   Subjective:    Patient ID: [name], female    DOB: 1951/01/05, 63 y.o.   MRN: [id number]  HPI  She states she had a mechanical fall 3 weeks ago without cardiac or neuro prodrome. She hit her elbow and sustained a hematoma. It has been increasing in size despite using warm compresses. There is no associated pain unless there is direct trauma to this.  She has not on any blood thinners except 81 mg of aspirin which she stopped after the injury @ the advice of her daughter who is a Marine scientist. She has no bleeding dyscrasias.   Review of Systems  Denied were any change in heart rhythm or rate prior to the event. There was no associated chest pain or shortness of breath .  Also specifically denied prior to the episode were headache, limb weakness, tingling, or numbness. No seizure activity noted.  Epistaxis, hemoptysis, hematuria, melena, or rectal bleeding denied. No unexplained weight loss, significant dyspepsia,dysphagia, or abdominal pain.  There is no abnormal bruising , bleeding, or difficulty stopping bleeding with injury.   She states that she does not check fasting sugars . Roughly 2 hours after a meal it's been 110. She states her last A1c was 5.1% ( actually 7.1 % in August).      Objective:   Physical Exam  Positive or pertinent findings include: Heart sounds are distant. She has minimal rales at the bases w/o increased work of breathing  As per [employer] Guidelines ,Epic documents morbid obesity as being present . She has a 10 x 8 cm fluctuant ,cystichematoma over the right lateral elbow. This is nontender to touch. There is no increased heat. There is no sign of cellulitis or lymphangitis. She has no epitrochlear, axillary, or cervical lymphadenopathy.  General appearance :adequately nourished; in no distress. Eyes: No conjunctival inflammation or scleral icterus is present. Heart:  Normal rate and regular rhythm. S1 and S2 normal without gallop, murmur, click, rub or  other extra sounds   Abdomen: protuberant;bowel sounds normal, soft and non-tender without masses, organomegaly or hernias noted.  No guarding or rebound.  Vascular : all pulses equal ; no bruits present. Skin:Warm & dry.  Intact without suspicious lesions or rashes otherwise ; no jaundice or tenting           Assessment & Plan:  #1 post traumatic hematoma  Plan: See after visit summary. If there is a no improvement; referral to general surgery for evacuation would be recommended.

## 2014-03-12 NOTE — Progress Notes (Signed)
Pre visit review using our clinic review tool, if applicable. No additional management support is needed unless otherwise documented below in the visit note. 

## 2014-03-12 NOTE — Telephone Encounter (Signed)
[name] CAN said pt fell 02/16/14 with injury to rt upper forearm; pt has expanding hematoma, now 1 1/2 " x 3" and raised. CAN disposition is pt needs to be seen today; pt initially refused available 6:15 pm appt; [facility] office has no available appts and pt refused appt at [facility]. Dr Darnell Level said pt needs to be seen today and if not accept [facility] office pt should be seen at [facility] today. [name] voiced understanding and will let pt know.

## 2014-03-12 NOTE — Telephone Encounter (Signed)
Patient Information:  Caller Name: Sh[place]  Phone: N/A  Patient: [name], [name]  Gender: Female  DOB: 05/13/1951  Age: 63 Years  PCP: Ria Bush [facility])  Office Follow Up:  Does the office need to follow up with this patient?: No  Instructions For The Office: N/A  RN Note:  Does not test blood glucose.  Office scheduler  made appointment for 03/16/14 and transferred for triage.  Care advice given: apply heat to hematoma for 10-15 minutes several times a day. Advised to see provider today for expanding raised bruise with size > 2 inches per Skin Injury guideline.  No appointments remain at [facility] or [place] office.  Declined 1630 appointment at [facility] office.  Notified Rena/office nurse that pt requests appt for 03/13/14.  Dr Danise Mina ordered to be seen today; if refused to travel to [facility], then needs to be seen at [facility] today. With these two options, patient elected to be seen at [facility].  Scheduled for 03/12/14 at [place] with Dr Linna Darner at [place] office.  Symptoms  Reason For Call & Symptoms: Hematoma on right forearm, following a fall 02/16/14, enlarged during the last week. Hematoma is 3-4" X 1.5." Hematoma stopped enalarging  two days ago.  Reviewed Health History In EMR: Yes  Reviewed Medications In EMR: Yes  Reviewed Allergies In EMR: Yes  Reviewed Surgeries / Procedures: Yes  Date of Onset of Symptoms: 03/05/2014  Treatments Tried: Cold compresses, moving arm  Treatments Tried Worked: No  Guideline(s) Used:  Arm Injury  Skin Injury  Disposition Per Guideline:   Go to Office Now  Reason For Disposition Reached:   Expanding raised bruise with size > 2 inches (5 cm)  Advice Given:  N/A  Patient Will Follow Care Advice:  YES  Appointment Scheduled:  03/12/2014 16:45:00 Appointment Scheduled Provider:  Other

## 2014-03-16 ENCOUNTER — Ambulatory Visit: Payer: Medicare Other | Admitting: Family Medicine

## 2014-03-17 ENCOUNTER — Ambulatory Visit: Payer: Medicare Other | Admitting: Internal Medicine

## 2014-03-24 ENCOUNTER — Ambulatory Visit: Payer: Medicare Other

## 2014-04-03 ENCOUNTER — Encounter: Payer: Self-pay | Admitting: Cardiovascular Disease

## 2014-04-03 ENCOUNTER — Ambulatory Visit (INDEPENDENT_AMBULATORY_CARE_PROVIDER_SITE_OTHER): Payer: Medicare Other | Admitting: Cardiovascular Disease

## 2014-04-03 VITALS — BP 110/80 | HR 69 | Ht 66.0 in | Wt 338.2 lb

## 2014-04-03 DIAGNOSIS — F32A Depression, unspecified: Secondary | ICD-10-CM

## 2014-04-03 DIAGNOSIS — F329 Major depressive disorder, single episode, unspecified: Secondary | ICD-10-CM

## 2014-04-03 DIAGNOSIS — E039 Hypothyroidism, unspecified: Secondary | ICD-10-CM

## 2014-04-03 DIAGNOSIS — R079 Chest pain, unspecified: Secondary | ICD-10-CM

## 2014-04-03 DIAGNOSIS — Z9989 Dependence on other enabling machines and devices: Secondary | ICD-10-CM

## 2014-04-03 DIAGNOSIS — I5032 Chronic diastolic (congestive) heart failure: Secondary | ICD-10-CM

## 2014-04-03 DIAGNOSIS — I2510 Atherosclerotic heart disease of native coronary artery without angina pectoris: Secondary | ICD-10-CM

## 2014-04-03 DIAGNOSIS — G4733 Obstructive sleep apnea (adult) (pediatric): Secondary | ICD-10-CM

## 2014-04-03 DIAGNOSIS — E785 Hyperlipidemia, unspecified: Secondary | ICD-10-CM

## 2014-04-03 DIAGNOSIS — I25119 Atherosclerotic heart disease of native coronary artery with unspecified angina pectoris: Secondary | ICD-10-CM

## 2014-04-03 DIAGNOSIS — R0602 Shortness of breath: Secondary | ICD-10-CM

## 2014-04-03 NOTE — Assessment & Plan Note (Signed)
She reports recent problems with depression eared Marlowe Kays on medications. Recommended a regular walking program

## 2014-04-03 NOTE — Assessment & Plan Note (Signed)
High risk of diastolic CHF given her obesity. Recommended that she continue to take Lasix daily, call our office for any ankle swelling, shortness of breath symptoms

## 2014-04-03 NOTE — Assessment & Plan Note (Signed)
She reports having problems with sleeping. She is compliant with her CPAP. Recent weight changes but she reports CPAP settings were recently evaluated and were appropriate

## 2014-04-03 NOTE — Assessment & Plan Note (Signed)
We have encouraged continued exercise, careful diet management in an effort to lose weight. Currently she is not trying to lose weight

## 2014-04-03 NOTE — Progress Notes (Signed)
Patient ID: [name], female    DOB: 23-May-1950, 63 y.o.   MRN: [id number]  HPI Comments: [name] is a very pleasant 63 year old woman with history of obesity, hyperlipidemia, hypothyroidism, diabetes who presents for routine followup of her hyperlipidemia, diabetes as well as history of chest pain in the past. Long smoking history for 30 years, stopped at age 9. She has sleep apnea, where see Pap Also restless leg syndrome  In follow-up today, she reports that she is feeling well. Denies having any significant chest pain with exertion. Weight has been a major issue. Weight is up 20 pounds from her prior clinic visit Rare fleeting chest discomfort, at rest. Nothing in particular with exertion. She takes care of 2 children for several hours a day. No regular exercise. Minimal lower extremity edema.  EKG shows normal sinus rhythm with no significant ST or T-wave changes  Review of blood work with her today shows TSH now in the 3 range, improved from previous labs  Other past medical history She was evaluated in the hospital July 2014 for chest pain. Cardiac enzymes negative, stress test also showed no ischemia. Previous episode of diastolic CHF. Prior echocardiogram in 2013 showing normal ejection fraction.        Outpatient Encounter Prescriptions as of 04/03/2014  Medication Sig  . ALPRAZolam (XANAX) 0.25 MG tablet Take 0.25 mg by mouth daily. For anxiety  . aspirin 81 MG tablet Take 81 mg by mouth every other day.   Marland Kitchen atorvastatin (LIPITOR) 40 MG tablet TAKE 1 TABLET (40 MG TOTAL) BY MOUTH AT BEDTIME.  . cyanocobalamin (,VITAMIN B-12,) 1000 MCG/ML injection Inject 1 mL (1,000 mcg total) into the muscle every 30 (thirty) days.  . furosemide (LASIX) 40 MG tablet TAKE 1 TABLET BY MOUTH EVERY DAY  . gabapentin (NEURONTIN) 600 MG tablet Take 1,200 mg by mouth at bedtime.  . lamoTRIgine (LAMICTAL) 200 MG tablet Take 200 mg by mouth daily.  Marland Kitchen levothyroxine (SYNTHROID) 112  MCG tablet Take 2 tablets (224 mcg total) by mouth daily before breakfast.  . metFORMIN (GLUCOPHAGE) 500 MG tablet TAKE 1 TABLET (500 MG TOTAL) BY MOUTH DAILY WITH BREAKFAST.  Marland Kitchen metroNIDAZOLE (FLAGYL) 500 MG tablet Take 1 tablet (500 mg total) by mouth 3 (three) times daily.  . naproxen (NAPROSYN) 500 MG tablet TAKE 1 TABLET (500 MG TOTAL) BY MOUTH DAILY AS NEEDED.  Marland Kitchen NEEDLE, DISP, 25 G (MONOJECT MAGELLAN SAFETY NDL) 25G X 1" MISC by Does not apply route as directed.    . nystatin ointment (MYCOSTATIN) Apply 1 application topically daily as needed. Apply to red areas in groin  . omeprazole (PRILOSEC) 40 MG capsule TAKE 1 CAPSULE (40 MG TOTAL) BY MOUTH DAILY.  Marland Kitchen ondansetron (ZOFRAN) 4 MG tablet Take 1 tablet (4 mg total) by mouth every 8 (eight) hours as needed for nausea or vomiting.  . potassium chloride (K-DUR) 10 MEQ tablet TAKE 2 TABLETS (20 MEQ TOTAL) BY MOUTH DAILY.  . ramipril (ALTACE) 10 MG capsule TAKE 1 CAPSULE (10 MG TOTAL) BY MOUTH DAILY.  . ranitidine (ZANTAC 75) 75 MG tablet Take 1 tablet (75 mg total) by mouth 2 (two) times daily.  Marland Kitchen rOPINIRole (REQUIP) 2 MG tablet Take 1 tablet (2 mg total) by mouth at bedtime.  . topiramate (TOPAMAX) 25 MG tablet Take 1 tablet (25 mg total) by mouth 2 (two) times daily.  Marland Kitchen triamcinolone cream (KENALOG) 0.1 % Apply 1 application topically 2 (two) times daily. Apply to AA on external  ear, don't use more than 2 weeks at a time.  . Vilazodone HCl (VIIBRYD) 40 MG TABS Take 40 mg by mouth every morning.    Review of Systems  Constitutional: Negative.   Respiratory: Positive for shortness of breath.   Cardiovascular: Positive for chest pain.  Gastrointestinal: Negative.   Musculoskeletal: Positive for arthralgias.  Skin: Negative.   Neurological: Negative.   Hematological: Negative.   Psychiatric/Behavioral: Negative.   All other systems reviewed and are negative.   BP 110/80 mmHg  Pulse 69  Ht 5\' 6"  (1.676 m)  Wt 338 lb 4 oz (153.429 kg)   BMI 54.62 kg/m2  Physical Exam  Constitutional: She is oriented to person, place, and time. She appears well-developed and well-nourished.  Morbidly obese  HENT:  Head: Normocephalic.  Nose: Nose normal.  Mouth/Throat: Oropharynx is clear and moist.  Eyes: Conjunctivae are normal. Pupils are equal, round, and reactive to light.  Neck: Normal range of motion. Neck supple. No JVD present.  Cardiovascular: Normal rate, regular rhythm, S1 normal, S2 normal, normal heart sounds and intact distal pulses.  Exam reveals no gallop and no friction rub.   No murmur heard. Pulmonary/Chest: Effort normal and breath sounds normal. No respiratory distress. She has no wheezes. She has no rales. She exhibits no tenderness.  Abdominal: Soft. Bowel sounds are normal. She exhibits no distension. There is no tenderness.  Musculoskeletal: Normal range of motion. She exhibits no edema or tenderness.  Lymphadenopathy:    She has no cervical adenopathy.  Neurological: She is alert and oriented to person, place, and time. Coordination normal.  Skin: Skin is warm and dry. No rash noted. No erythema.  Psychiatric: She has a normal mood and affect. Her behavior is normal. Judgment and thought content normal.    Assessment and Plan  Nursing note and vitals reviewed.

## 2014-04-03 NOTE — Assessment & Plan Note (Signed)
Currently with no symptoms of angina. No further workup at this time. Continue current medication regimen. 

## 2014-04-03 NOTE — Assessment & Plan Note (Signed)
Recommended that she continue on her Lipitor

## 2014-04-03 NOTE — Assessment & Plan Note (Signed)
TSH now within normal range. Previously greater than 100

## 2014-04-03 NOTE — Patient Instructions (Signed)
You are doing well. No medication changes were made.  Please call us if you have new issues that need to be addressed before your next appt.  Your physician wants you to follow-up in: 12 months.  You will receive a reminder letter in the mail two months in advance. If you don't receive a letter, please call our office to schedule the follow-up appointment. 

## 2014-04-07 ENCOUNTER — Ambulatory Visit: Payer: Medicare Other | Admitting: Internal Medicine

## 2014-04-23 ENCOUNTER — Encounter (HOSPITAL_COMMUNITY): Payer: Self-pay | Admitting: Cardiovascular Disease

## 2014-05-01 ENCOUNTER — Ambulatory Visit: Payer: Medicare Other | Admitting: Family Medicine

## 2014-05-01 ENCOUNTER — Telehealth: Payer: Self-pay | Admitting: Internal Medicine

## 2014-05-01 ENCOUNTER — Encounter: Payer: Self-pay | Admitting: Internal Medicine

## 2014-05-01 NOTE — Telephone Encounter (Signed)
Agree  If C diff PCR + and persistent diarrhea she needs vancomycin 125 mg qid x 10 d

## 2014-05-01 NOTE — Telephone Encounter (Signed)
Patient is having diarrhea and urgency again.  She has a history of c-diff in October. She feels the c-diff is back.  She is advised to come have stool study done that was ordered in October..  There are orders in the system.  She says she will come today if she can.

## 2014-05-04 ENCOUNTER — Other Ambulatory Visit: Payer: Medicare Other

## 2014-05-04 DIAGNOSIS — R197 Diarrhea, unspecified: Secondary | ICD-10-CM

## 2014-05-04 DIAGNOSIS — A0472 Enterocolitis due to Clostridium difficile, not specified as recurrent: Secondary | ICD-10-CM

## 2014-05-05 LAB — CLOSTRIDIUM DIFFICILE BY PCR: Toxigenic C. Difficile by PCR: NOT DETECTED

## 2014-05-06 ENCOUNTER — Telehealth: Payer: Self-pay | Admitting: Internal Medicine

## 2014-05-06 NOTE — Telephone Encounter (Signed)
Patient notified of the results  

## 2014-05-10 ENCOUNTER — Other Ambulatory Visit: Payer: Self-pay | Admitting: Family Medicine

## 2014-05-12 ENCOUNTER — Ambulatory Visit: Payer: Medicare Other | Admitting: Family Medicine

## 2014-05-14 ENCOUNTER — Ambulatory Visit (INDEPENDENT_AMBULATORY_CARE_PROVIDER_SITE_OTHER): Payer: Medicare Other | Admitting: Family Medicine

## 2014-05-14 ENCOUNTER — Ambulatory Visit (INDEPENDENT_AMBULATORY_CARE_PROVIDER_SITE_OTHER)
Admission: RE | Admit: 2014-05-14 | Discharge: 2014-05-14 | Disposition: A | Discharge status: Home / Self Care | Payer: Medicare Other | Source: Ambulatory Visit | Attending: Family Medicine | Admitting: Family Medicine

## 2014-05-14 ENCOUNTER — Encounter: Payer: Self-pay | Admitting: Family Medicine

## 2014-05-14 VITALS — BP 134/84 | HR 64 | Temp 98.3°F | Wt 352.5 lb

## 2014-05-14 DIAGNOSIS — E538 Deficiency of other specified B group vitamins: Secondary | ICD-10-CM

## 2014-05-14 DIAGNOSIS — S5011XD Contusion of right forearm, subsequent encounter: Secondary | ICD-10-CM

## 2014-05-14 DIAGNOSIS — M25562 Pain in left knee: Secondary | ICD-10-CM

## 2014-05-14 DIAGNOSIS — J989 Respiratory disorder, unspecified: Secondary | ICD-10-CM

## 2014-05-14 DIAGNOSIS — R2689 Other abnormalities of gait and mobility: Secondary | ICD-10-CM

## 2014-05-14 DIAGNOSIS — G2581 Restless legs syndrome: Secondary | ICD-10-CM

## 2014-05-14 MED ORDER — CYANOCOBALAMIN 1000 MCG/ML IJ SOLN
1000.0000 ug | Freq: Once | INTRAMUSCULAR | Status: AC
Start: 2014-05-14 — End: 2014-05-14
  Administered 2014-05-14: 1000 ug via INTRAMUSCULAR

## 2014-05-14 MED ORDER — NAPROXEN 500 MG PO TABS: ORAL_TABLET | ORAL | Status: DC

## 2014-05-14 MED ORDER — CYANOCOBALAMIN 1000 MCG/ML IJ SOLN: 1000.0000 ug | INTRAMUSCULAR | Status: DC

## 2014-05-14 MED ORDER — OMEPRAZOLE 40 MG PO CPDR: DELAYED_RELEASE_CAPSULE | ORAL | Status: DC

## 2014-05-14 NOTE — Patient Instructions (Addendum)
b12 shot today. Continue cane use. Pass by [facility]'s office for referral to gen surgery to evaluate hematoma. Work hard towards weight loss - increased walking and watching diet - I recommend mediterranean diet. For knee - xray today. Ice to knee. May restart naprosyn as needed with food.  Ok to take extra requip for restless legs.

## 2014-05-14 NOTE — Progress Notes (Signed)
Pre visit review using our clinic review tool, if applicable. No additional management support is needed unless otherwise documented below in the visit note. 

## 2014-05-14 NOTE — Progress Notes (Signed)
BP 134/84 mmHg  Pulse 64  Temp(Src) 98.3 F (36.8 C) (Oral)  Wt 352 lb 8 oz (159.893 kg)   CC: discuss multiple issues Subjective:    Patient ID: [name], female    DOB: 05/29/1950, 63 y.o.   MRN: [id number]  HPI: [name] is a 63 y.o. female presenting on 05/14/2014 for Restless leg; Recent falling; and Wound Check   Seen by Dr Linna Darner 10/29 after fall early October - sustained elbow hematoma.  At that time, 10x8cm fluctuant hematoma R lateral elbow not tender to touch. Only on aspirin no other blood thinner. Still no signs of infection. Had another fall last month x2 - hit same area and hematoma has extended.    3rd fall in last 3 months. Trouble with balance, no premonitory sxs - dizziness, presyncope, palpitations, chest pain. Trouble with vision - has eye doctor appt Jan. Falls have happened on slippery floor in [employer] and while walking up stairs. Has started using cane.  Requests referral for imbalance to Dr Dohmeier who she sees [facility] on CPAP.  L knee pain started worsening after fall. Ongoing issue for last year but worse this past week - more catching and locking. + instability.   Worsening RLS even mid day - describes crawling sensation in legs, "I need somebody to stretch my legs". "restless sensation". Asks if ok to take 2nd dose occasionally.  Obesity - marked weight gain noted. Pt motivated to restart walking routine. Lab Results  Component Value Date   HGBA1C 7.1* 01/09/2014    Relevant past medical, surgical, family and social history reviewed and updated as indicated. Interim medical history since our last visit reviewed. Allergies and medications reviewed and updated. Current Outpatient Prescriptions on File Prior to Visit  Medication Sig  . ALPRAZolam (XANAX) 0.25 MG tablet Take 0.25 mg by mouth daily. For anxiety  . aspirin 81 MG tablet Take 81 mg by mouth every other day.   Marland Kitchen atorvastatin (LIPITOR) 40 MG tablet TAKE 1 TABLET (40 MG TOTAL) BY  MOUTH AT BEDTIME.  . furosemide (LASIX) 40 MG tablet TAKE 1 TABLET BY MOUTH EVERY DAY  . gabapentin (NEURONTIN) 600 MG tablet Take 1,200 mg by mouth at bedtime.  . lamoTRIgine (LAMICTAL) 200 MG tablet Take 200 mg by mouth daily.  Marland Kitchen levothyroxine (SYNTHROID, LEVOTHROID) 112 MCG tablet TAKE 2 TABLETS (224 MCG TOTAL) BY MOUTH DAILY BEFORE BREAKFAST.  . metFORMIN (GLUCOPHAGE) 500 MG tablet TAKE 1 TABLET (500 MG TOTAL) BY MOUTH DAILY WITH BREAKFAST.  Marland Kitchen NEEDLE, DISP, 25 G (MONOJECT MAGELLAN SAFETY NDL) 25G X 1" MISC by Does not apply route as directed.    . nystatin ointment (MYCOSTATIN) Apply 1 application topically daily as needed. Apply to red areas in groin  . ondansetron (ZOFRAN) 4 MG tablet Take 1 tablet (4 mg total) by mouth every 8 (eight) hours as needed for nausea or vomiting.  . potassium chloride (K-DUR) 10 MEQ tablet TAKE 2 TABLETS (20 MEQ TOTAL) BY MOUTH DAILY.  . ramipril (ALTACE) 10 MG capsule TAKE 1 CAPSULE (10 MG TOTAL) BY MOUTH DAILY.  . ranitidine (ZANTAC 75) 75 MG tablet Take 1 tablet (75 mg total) by mouth 2 (two) times daily.  Marland Kitchen topiramate (TOPAMAX) 25 MG tablet Take 1 tablet (25 mg total) by mouth 2 (two) times daily.  Marland Kitchen triamcinolone cream (KENALOG) 0.1 % Apply 1 application topically 2 (two) times daily. Apply to AA on external ear, don't use more than 2 weeks at a time.  Marland Kitchen  Vilazodone HCl (VIIBRYD) 40 MG TABS Take 40 mg by mouth every morning.   No current facility-administered medications on file prior to visit.    Review of Systems Per HPI unless specifically indicated above     Objective:    BP 134/84 mmHg  Pulse 64  Temp(Src) 98.3 F (36.8 C) (Oral)  Wt 352 lb 8 oz (159.893 kg)  Wt Readings from Last 3 Encounters:  05/14/14 352 lb 8 oz (159.893 kg)  04/03/14 338 lb 4 oz (153.429 kg)  03/12/14 336 lb (152.409 kg)    Physical Exam  Constitutional: She appears well-developed and well-nourished. No distress.  Morbidly obese  HENT:  Mouth/Throat: Oropharynx  is clear and moist. No oropharyngeal exudate.  Pulmonary/Chest: Breath sounds normal.  Musculoskeletal: She exhibits no edema.  R knee WNL L Knee exam: No deformity on inspection. Difficult 2/2 body habitus Tenderness to palpation medial to patella No effusion/swelling noted. FROM in flex/extension without crepitus. No popliteal fullness. Neg drawer test. Neg mcmurray test. No PFgrind. No abnormal patellar mobility.  Skin: Skin is warm and dry. No rash noted. No erythema.  13x6cm fluctuant hematoma in bowling pin shape R dorsal forearm. No erythema, warmth.  Nursing note and vitals reviewed.      Assessment & Plan:   Problem List Items Addressed This Visit    Traumatic hematoma of forearm - Primary    Persistent over last 3 months with extension of hematoma after fall 1 mo ago Will refer to gen surgery to review possibility of aspiration per pt request.    Relevant Orders      Ambulatory referral to General Surgery   RLS (restless legs syndrome)    Discussed ok to use 2nd requip dose intermittently.    Left knee pain    Story consistent with meniscal injury but exam not consistent with this.  ?osteoarthritis flare after fall - will check baseline xray today. Treat with rest, ice, and refilled naprosyn. Update if worsening for referral to ortho.    Relevant Orders      DG Knee Complete 4 Views Left (Completed)   Imbalance    Anticipate related to weight gain and deconditioning, as well as knee pain/weakness. Discussed this and importance of weight loss, doubt neurological cause. Regardless pt requests referral to neuro Dr Dohmeier for further evaluation of imbalance. Will place referral. Encouraged regular use of cane to help prevent falls.    Relevant Orders      Ambulatory referral to Neurology   Extreme obesity with respiratory disorder    Continue to encourage weight loss through healthy diet and lifestyle changes. Discussed relation of morbid obesity and  deconditioning to imbalance.    B12 deficiency    b12 shot today. Then refilled b12 for pt to administer at home.    Relevant Medications      cyanocobalamin (,VITAMIN B-12,) 1000 MCG/ML injection      cyanocobalamin ((VITAMIN B-12)) injection 1,000 mcg (Completed)       Follow up plan: Return in about 3 months (around 08/13/2014), or as needed, for follow up visit.

## 2014-05-15 DIAGNOSIS — M1712 Unilateral primary osteoarthritis, left knee: Secondary | ICD-10-CM

## 2014-05-15 DIAGNOSIS — S5010XA Contusion of unspecified forearm, initial encounter: Secondary | ICD-10-CM | POA: Insufficient documentation

## 2014-05-15 HISTORY — DX: Unilateral primary osteoarthritis, left knee: M17.12

## 2014-05-15 NOTE — Assessment & Plan Note (Signed)
Discussed ok to use 2nd requip dose intermittently.

## 2014-05-15 NOTE — Assessment & Plan Note (Signed)
Anticipate related to weight gain and deconditioning, as well as knee pain/weakness. Discussed this and importance of weight loss, doubt neurological cause. Regardless pt requests referral to neuro Dr Dohmeier for further evaluation of imbalance. Will place referral. Encouraged regular use of cane to help prevent falls.

## 2014-05-15 NOTE — Assessment & Plan Note (Addendum)
Persistent over last 3 months with extension of hematoma after fall 1 mo ago Will refer to gen surgery to review possibility of aspiration per pt request.

## 2014-05-15 NOTE — Assessment & Plan Note (Signed)
Continue to encourage weight loss through healthy diet and lifestyle changes. Discussed relation of morbid obesity and deconditioning to imbalance.

## 2014-05-15 NOTE — Assessment & Plan Note (Signed)
Story consistent with meniscal injury but exam not consistent with this.  ?osteoarthritis flare after fall - will check baseline xray today. Treat with rest, ice, and refilled naprosyn. Update if worsening for referral to ortho.

## 2014-05-15 NOTE — Assessment & Plan Note (Addendum)
b12 shot today. Then refilled b12 for pt to administer at home.

## 2014-05-25 ENCOUNTER — Other Ambulatory Visit: Payer: Self-pay | Admitting: Family Medicine

## 2014-06-02 ENCOUNTER — Encounter: Payer: Self-pay | Admitting: General Surgery

## 2014-06-03 ENCOUNTER — Other Ambulatory Visit: Payer: Self-pay | Admitting: Family Medicine

## 2014-06-08 ENCOUNTER — Encounter: Payer: Self-pay | Admitting: Family Medicine

## 2014-06-15 ENCOUNTER — Ambulatory Visit: Payer: Self-pay | Admitting: General Surgery

## 2014-06-24 ENCOUNTER — Telehealth: Payer: Self-pay

## 2014-06-24 NOTE — Telephone Encounter (Signed)
BMI too high.  OV or direct hospital procedure?  Thank you, Annalea Alguire/previsit

## 2014-06-24 NOTE — Telephone Encounter (Signed)
direct

## 2014-06-24 NOTE — Telephone Encounter (Signed)
Next available dates to schedulewill be the week of May 9th.  [name] will speak with the patient and if they are willing to have procedure in May I will set up.

## 2014-07-10 ENCOUNTER — Encounter: Payer: Medicare Other | Admitting: Internal Medicine

## 2014-07-10 ENCOUNTER — Ambulatory Visit (INDEPENDENT_AMBULATORY_CARE_PROVIDER_SITE_OTHER): Payer: Medicare Other | Admitting: Family Medicine

## 2014-07-10 VITALS — BP 122/82 | HR 67 | Temp 98.1°F | Wt 347.2 lb

## 2014-07-10 DIAGNOSIS — M545 Low back pain, unspecified: Secondary | ICD-10-CM | POA: Insufficient documentation

## 2014-07-10 MED ORDER — TRAMADOL HCL 50 MG PO TABS: 50.0000 mg | ORAL_TABLET | Freq: Three times a day (TID) | ORAL | Status: DC | PRN

## 2014-07-10 NOTE — Assessment & Plan Note (Signed)
Anticipate left sacroiliitis - treat with continued naprosyn, ice/heat, tramadol for breakthrough pain. Provided with exercises from [facility] pt advisor. Update [place] if not improving as expected.

## 2014-07-10 NOTE — Patient Instructions (Signed)
I wonder about sacroiliitis pain - handout provided today with exercises. Continue naprosyn, ice or heat, and tramadol for breakthrough pain. Update [place] if not improving as expected.

## 2014-07-10 NOTE — Progress Notes (Signed)
BP 122/82 mmHg  Pulse 67  Temp(Src) 98.1 F (36.7 C) (Oral)  Wt 347 lb 4 oz (157.512 kg)  SpO2 98%   CC: lower back pain   Subjective:    Patient ID: [name], female    DOB: 1950/09/25, 64 y.o.   MRN: [id number]  HPI: [name] is a 64 y.o. female presenting on 07/10/2014 for Back Pain   1 wk h/o lower back pain progressively worsening. Yesterday to the point of trouble getting out of bed. + nausea from pain. Treated with nausea medication. Sleeping on a heating pad. Pain starts L posterior hip and radiates across lower back. Denies inciting trauma or falls.  Denies fevers/chills, shooting pian down legs, denies urinary symptoms like frequency urgency or dysuria. No bowel or bladder incontinence. Denies saddles anesthesia or any numbness/weakness of legs otherwise.   On naprosyn for pain which is not really helpful. Also tried tylenol without significant improvement.  Also regularly takes gabapentin 1200mg  nightly.  Relevant past medical, surgical, family and social history reviewed and updated as indicated. Interim medical history since our last visit reviewed. Allergies and medications reviewed and updated. Current Outpatient Prescriptions on File Prior to Visit  Medication Sig  . ALPRAZolam (XANAX) 0.25 MG tablet Take 0.25 mg by mouth daily. For anxiety  . aspirin 81 MG tablet Take 81 mg by mouth every other day.   Marland Kitchen atorvastatin (LIPITOR) 40 MG tablet TAKE 1 TABLET (40 MG TOTAL) BY MOUTH AT BEDTIME.  . cyanocobalamin (,VITAMIN B-12,) 1000 MCG/ML injection Inject 1 mL (1,000 mcg total) into the muscle every 30 (thirty) days.  . furosemide (LASIX) 40 MG tablet TAKE 1 TABLET BY MOUTH EVERY DAY  . gabapentin (NEURONTIN) 600 MG tablet Take 1,200 mg by mouth at bedtime.  Marland Kitchen KLOR-CON 10 10 MEQ tablet TAKE 2 TABLETS (20 MEQ TOTAL) BY MOUTH DAILY.  Marland Kitchen lamoTRIgine (LAMICTAL) 200 MG tablet Take 200 mg by mouth daily.  Marland Kitchen levothyroxine (SYNTHROID, LEVOTHROID) 112 MCG tablet  TAKE 2 TABLETS (224 MCG TOTAL) BY MOUTH DAILY BEFORE BREAKFAST.  . metFORMIN (GLUCOPHAGE) 500 MG tablet TAKE 1 TABLET (500 MG TOTAL) BY MOUTH DAILY WITH BREAKFAST.  . naproxen (NAPROSYN) 500 MG tablet TAKE 1 TABLET (500 MG TOTAL) BY MOUTH DAILY AS NEEDED.  Marland Kitchen NEEDLE, DISP, 25 G (MONOJECT MAGELLAN SAFETY NDL) 25G X 1" MISC by Does not apply route as directed.    . nystatin ointment (MYCOSTATIN) Apply 1 application topically daily as needed. Apply to red areas in groin  . omeprazole (PRILOSEC) 40 MG capsule TAKE 1 CAPSULE (40 MG TOTAL) BY MOUTH DAILY.  Marland Kitchen ondansetron (ZOFRAN) 4 MG tablet Take 1 tablet (4 mg total) by mouth every 8 (eight) hours as needed for nausea or vomiting.  . ramipril (ALTACE) 10 MG capsule TAKE 1 CAPSULE (10 MG TOTAL) BY MOUTH DAILY.  . ranitidine (ZANTAC 75) 75 MG tablet Take 1 tablet (75 mg total) by mouth 2 (two) times daily.  Marland Kitchen rOPINIRole (REQUIP) 2 MG tablet Take 1-2 tablets (2-4 mg total) by mouth at bedtime.  . topiramate (TOPAMAX) 25 MG tablet Take 1 tablet (25 mg total) by mouth 2 (two) times daily.  Marland Kitchen triamcinolone cream (KENALOG) 0.1 % Apply 1 application topically 2 (two) times daily. Apply to AA on external ear, don't use more than 2 weeks at a time.  . Vilazodone HCl (VIIBRYD) 40 MG TABS Take 40 mg by mouth every morning.   No current facility-administered medications on file prior to  visit.    Review of Systems Per HPI unless specifically indicated above     Objective:    BP 122/82 mmHg  Pulse 67  Temp(Src) 98.1 F (36.7 C) (Oral)  Wt 347 lb 4 oz (157.512 kg)  SpO2 98%  Wt Readings from Last 3 Encounters:  07/10/14 347 lb 4 oz (157.512 kg)  05/14/14 352 lb 8 oz (159.893 kg)  04/03/14 338 lb 4 oz (153.429 kg)    Physical Exam  Constitutional: She appears well-developed and well-nourished. No distress.  Morbidly obese  Musculoskeletal: She exhibits no edema.  No pain midline spine No paraspinous mm tenderness Neg SLR bilaterally. No pain with  int/ext rotation at hip. Neg FABER. No pain at GTB or sciatic notch bilaterally.  + tender at L SIJ  Neurological: She has normal strength. No sensory deficit. She displays a negative Romberg sign. Coordination and gait normal.  5/5 strength BLE Antalgic gait  Nursing note and vitals reviewed.      Assessment & Plan:   Problem List Items Addressed This Visit    Pain in lower back - Primary    Anticipate left sacroiliitis - treat with continued naprosyn, ice/heat, tramadol for breakthrough pain. Provided with exercises from [facility] pt advisor. Update [place] if not improving as expected.      Relevant Medications   traMADol (ULTRAM) tablet 50 mg       Follow up plan: Return if symptoms worsen or fail to improve.

## 2014-07-10 NOTE — Progress Notes (Signed)
Pre visit review using our clinic review tool, if applicable. No additional management support is needed unless otherwise documented below in the visit note. 

## 2014-07-14 ENCOUNTER — Encounter: Payer: Self-pay | Admitting: *Deleted

## 2014-07-17 ENCOUNTER — Other Ambulatory Visit: Payer: Medicare Other

## 2014-07-19 ENCOUNTER — Other Ambulatory Visit: Payer: Self-pay | Admitting: Family Medicine

## 2014-07-24 ENCOUNTER — Encounter: Payer: Medicare Other | Admitting: Family Medicine

## 2014-08-09 ENCOUNTER — Encounter: Payer: Self-pay | Admitting: Family Medicine

## 2014-08-10 MED ORDER — NYSTATIN 100000 UNIT/GM EX OINT: 1.0000 "application " | TOPICAL_OINTMENT | Freq: Every day | CUTANEOUS | Status: DC | PRN

## 2014-08-20 ENCOUNTER — Other Ambulatory Visit: Payer: Self-pay | Admitting: Family Medicine

## 2014-08-20 DIAGNOSIS — E039 Hypothyroidism, unspecified: Secondary | ICD-10-CM

## 2014-08-20 DIAGNOSIS — IMO0002 Reserved for concepts with insufficient information to code with codable children: Secondary | ICD-10-CM

## 2014-08-20 DIAGNOSIS — E785 Hyperlipidemia, unspecified: Secondary | ICD-10-CM

## 2014-08-20 DIAGNOSIS — E1165 Type 2 diabetes mellitus with hyperglycemia: Secondary | ICD-10-CM

## 2014-08-20 DIAGNOSIS — E538 Deficiency of other specified B group vitamins: Secondary | ICD-10-CM

## 2014-08-21 ENCOUNTER — Telehealth: Payer: Self-pay | Admitting: Internal Medicine

## 2014-08-21 ENCOUNTER — Other Ambulatory Visit: Payer: Medicare Other

## 2014-08-21 NOTE — Telephone Encounter (Signed)
Patient reports urgent diarrhea that started a few days ago.  She will come in and see Arta Bruce, PA on wed 08/26/14

## 2014-08-26 ENCOUNTER — Ambulatory Visit: Payer: Medicare Other | Admitting: Physician Assistant

## 2014-08-28 ENCOUNTER — Encounter: Payer: Medicare Other | Admitting: Family Medicine

## 2014-08-28 ENCOUNTER — Telehealth: Payer: Self-pay | Admitting: Family Medicine

## 2014-08-28 NOTE — Telephone Encounter (Signed)
Pt called to cancel her 10:30 appointment today.  She stated she was in her flower bed this morning and fell.  She is waiting on her daughter to come look at her.  She stated her daughter was a [employer] scientist.  She refused to talk to [facility]. Is ok to cancel appointment?

## 2014-08-28 NOTE — Telephone Encounter (Signed)
Ok to cancel

## 2014-08-28 NOTE — Telephone Encounter (Signed)
Appointment canceled pt will call back to r/s

## 2014-09-04 ENCOUNTER — Other Ambulatory Visit: Payer: Self-pay | Admitting: Cardiovascular Disease

## 2014-09-08 ENCOUNTER — Other Ambulatory Visit: Payer: Self-pay | Admitting: Unknown Physician Specialty

## 2014-09-08 ENCOUNTER — Telehealth: Payer: Self-pay | Admitting: Internal Medicine

## 2014-09-08 DIAGNOSIS — M1712 Unilateral primary osteoarthritis, left knee: Secondary | ICD-10-CM

## 2014-09-08 DIAGNOSIS — R197 Diarrhea, unspecified: Secondary | ICD-10-CM

## 2014-09-08 DIAGNOSIS — M2392 Unspecified internal derangement of left knee: Secondary | ICD-10-CM

## 2014-09-08 NOTE — Telephone Encounter (Signed)
c diff PCR for diarrhea

## 2014-09-08 NOTE — Telephone Encounter (Signed)
Patient report diarrhea for the last few weeks she does have a history of c-diff.  She was scheduled to see Arta Bruce, PA on 4/16, but she rescheduled for an appt this September 10, 2022. She has a death in the family and can't come 2022-09-10. She wants to come in and give a stool specimen.  Dr. Carlean Purl please advise

## 2014-09-08 NOTE — Telephone Encounter (Signed)
Patient notified appt for Friday has been cancelled She will come today or tomorrow for stool study

## 2014-09-11 ENCOUNTER — Ambulatory Visit: Payer: Medicare Other | Admitting: Physician Assistant

## 2014-09-13 ENCOUNTER — Other Ambulatory Visit: Payer: Self-pay | Admitting: Family Medicine

## 2014-09-14 ENCOUNTER — Other Ambulatory Visit: Payer: Medicare Other

## 2014-09-14 NOTE — Telephone Encounter (Signed)
Ok to refill 

## 2014-09-15 ENCOUNTER — Ambulatory Visit: Payer: Medicare Other

## 2014-09-18 ENCOUNTER — Ambulatory Visit
Admission: RE | Admit: 2014-09-18 | Discharge: 2014-09-18 | Disposition: A | Discharge status: Home / Self Care | Payer: Medicare Other | Source: Ambulatory Visit | Attending: Unknown Physician Specialty | Admitting: Unknown Physician Specialty

## 2014-09-18 ENCOUNTER — Telehealth: Payer: Self-pay | Admitting: *Deleted

## 2014-09-18 ENCOUNTER — Ambulatory Visit (INDEPENDENT_AMBULATORY_CARE_PROVIDER_SITE_OTHER): Payer: Medicare Other

## 2014-09-18 VITALS — HR 70 | Ht 65.5 in | Wt 339.0 lb

## 2014-09-18 DIAGNOSIS — R079 Chest pain, unspecified: Secondary | ICD-10-CM | POA: Diagnosis not present

## 2014-09-18 DIAGNOSIS — M2392 Unspecified internal derangement of left knee: Secondary | ICD-10-CM | POA: Diagnosis present

## 2014-09-18 DIAGNOSIS — M1712 Unilateral primary osteoarthritis, left knee: Secondary | ICD-10-CM

## 2014-09-18 DIAGNOSIS — M179 Osteoarthritis of knee, unspecified: Secondary | ICD-10-CM | POA: Diagnosis not present

## 2014-09-18 NOTE — Telephone Encounter (Signed)
For 3 days she's been having pain in her shoulder or over back and pressure between her chest.  Pt c/o of Chest Pain: STAT if CP now or developed within 24 hours  1. Are you having CP right now? No not constant but does comes  2. Are you experiencing any other symptoms (ex. SOB, nausea, vomiting, sweating)? Just pains and arms get weak, not numb but weak   3. How long have you been experiencing CP? 3 days   4. Is your CP continuous or coming and going? Coming and going   5. Have you taken Nitroglycerin? She does not have any.   Also 3 days ago she doubled the fluid pills and potassium for she was not able to walk when she was swollen. She feels fine but just wants to make sure she is okay.  ?

## 2014-09-18 NOTE — Progress Notes (Signed)
1.) Reason for visit: EKG  2.) Name of MD requesting visit: Dr. Rockey Situ  3.) H&P: Pt reports pain in her chest for 3 days that has been radiating across her back and shoulders.    4.) ROS related to problem:  Pt denies any symptoms at the time, states that she wants to make sure that she is ok to go to the beach for a week.   5.) Assessment and plan per MD: Dr. Rockey Situ reviewed EKG and pt was advised that she can go to the beach as planned.

## 2014-09-18 NOTE — Telephone Encounter (Signed)
Pt came in today for nurse visit & EKG.

## 2014-09-29 ENCOUNTER — Other Ambulatory Visit (INDEPENDENT_AMBULATORY_CARE_PROVIDER_SITE_OTHER): Payer: Medicare Other

## 2014-09-29 DIAGNOSIS — IMO0002 Reserved for concepts with insufficient information to code with codable children: Secondary | ICD-10-CM

## 2014-09-29 DIAGNOSIS — E039 Hypothyroidism, unspecified: Secondary | ICD-10-CM | POA: Diagnosis not present

## 2014-09-29 DIAGNOSIS — E1165 Type 2 diabetes mellitus with hyperglycemia: Secondary | ICD-10-CM

## 2014-09-29 DIAGNOSIS — E785 Hyperlipidemia, unspecified: Secondary | ICD-10-CM

## 2014-09-29 DIAGNOSIS — E538 Deficiency of other specified B group vitamins: Secondary | ICD-10-CM

## 2014-09-29 LAB — BASIC METABOLIC PANEL
BUN: 20 mg/dL (ref 6–23)
CALCIUM: 9.2 mg/dL (ref 8.4–10.5)
CO2: 27 mEq/L (ref 19–32)
CREATININE: 0.85 mg/dL (ref 0.40–1.20)
Chloride: 105 mEq/L (ref 96–112)
GFR: 71.94 mL/min (ref >60.00)
Glucose, Bld: 200 mg/dL — ABNORMAL HIGH (ref 70–99)
POTASSIUM: 3.9 meq/L (ref 3.5–5.1)
Sodium: 139 mEq/L (ref 135–145)

## 2014-09-29 LAB — LIPID PANEL
CHOLESTEROL: 153 mg/dL (ref 0–200)
HDL: 51.1 mg/dL (ref >39.00)
LDL Cholesterol: 69 mg/dL (ref 0–99)
NonHDL: 101.9
Total CHOL/HDL Ratio: 3
Triglycerides: 164 mg/dL — ABNORMAL HIGH (ref 0.0–149.0)
VLDL: 32.8 mg/dL (ref 0.0–40.0)

## 2014-09-29 LAB — MICROALBUMIN / CREATININE URINE RATIO
Creatinine,U: 202 mg/dL
MICROALB UR: 1.9 mg/dL (ref 0.0–1.9)
Microalb Creat Ratio: 0.9 mg/g (ref 0.0–30.0)

## 2014-09-29 LAB — HEMOGLOBIN A1C: Hgb A1c MFr Bld: 8.1 % — ABNORMAL HIGH (ref 4.6–6.5)

## 2014-09-29 LAB — TSH: TSH: 6.25 u[IU]/mL — ABNORMAL HIGH (ref 0.35–4.50)

## 2014-09-29 LAB — VITAMIN B12: VITAMIN B 12: 202 pg/mL — AB (ref 211–911)

## 2014-10-02 ENCOUNTER — Ambulatory Visit (INDEPENDENT_AMBULATORY_CARE_PROVIDER_SITE_OTHER): Payer: Medicare Other | Admitting: Family Medicine

## 2014-10-02 ENCOUNTER — Encounter: Payer: Self-pay | Admitting: Family Medicine

## 2014-10-02 VITALS — BP 118/74 | HR 73 | Temp 98.1°F | Ht 65.0 in | Wt 342.5 lb

## 2014-10-02 DIAGNOSIS — F32A Depression, unspecified: Secondary | ICD-10-CM

## 2014-10-02 DIAGNOSIS — IMO0002 Reserved for concepts with insufficient information to code with codable children: Secondary | ICD-10-CM

## 2014-10-02 DIAGNOSIS — E538 Deficiency of other specified B group vitamins: Secondary | ICD-10-CM | POA: Diagnosis not present

## 2014-10-02 DIAGNOSIS — Z0001 Encounter for general adult medical examination with abnormal findings: Secondary | ICD-10-CM | POA: Insufficient documentation

## 2014-10-02 DIAGNOSIS — E1165 Type 2 diabetes mellitus with hyperglycemia: Secondary | ICD-10-CM

## 2014-10-02 DIAGNOSIS — Z0000 Encounter for general adult medical examination without abnormal findings: Secondary | ICD-10-CM

## 2014-10-02 DIAGNOSIS — R197 Diarrhea, unspecified: Secondary | ICD-10-CM

## 2014-10-02 DIAGNOSIS — I2510 Atherosclerotic heart disease of native coronary artery without angina pectoris: Secondary | ICD-10-CM

## 2014-10-02 DIAGNOSIS — E785 Hyperlipidemia, unspecified: Secondary | ICD-10-CM

## 2014-10-02 DIAGNOSIS — E039 Hypothyroidism, unspecified: Secondary | ICD-10-CM

## 2014-10-02 DIAGNOSIS — Z7189 Other specified counseling: Secondary | ICD-10-CM | POA: Insufficient documentation

## 2014-10-02 DIAGNOSIS — F329 Major depressive disorder, single episode, unspecified: Secondary | ICD-10-CM

## 2014-10-02 MED ORDER — CYANOCOBALAMIN 1000 MCG/ML IJ SOLN
1000.0000 ug | Freq: Once | INTRAMUSCULAR | Status: AC
  Administered 2014-10-02: 1000 ug via INTRAMUSCULAR

## 2014-10-02 NOTE — Assessment & Plan Note (Signed)
Preventative protocols reviewed and updated unless pt declined. Discussed healthy diet and lifestyle.  

## 2014-10-02 NOTE — Assessment & Plan Note (Signed)
Discussed deteriorated control. Pt admits to dietary indiscretions with increased sugar intake.

## 2014-10-02 NOTE — Assessment & Plan Note (Signed)
Chronic, stable. Great control on lipitor.

## 2014-10-02 NOTE — Assessment & Plan Note (Signed)

## 2014-10-02 NOTE — Patient Instructions (Addendum)
B12 shot today Call to schedule well woman exam and mammogram with Dr Helane Rima. Call your insurance about the shingles shot to see if it is covered or how much it would cost and where is cheaper (here or pharmacy).  If you want to receive here, call for nurse visit.  Bring copy of advanced directive to update your chart. Return in 3 months for follow up

## 2014-10-02 NOTE — Assessment & Plan Note (Signed)
Chronic, stable 

## 2014-10-02 NOTE — Assessment & Plan Note (Signed)
Feels well controlled on current regimen. On vybriid and lamictal.

## 2014-10-02 NOTE — Assessment & Plan Note (Signed)
TSH elevated - despite compliance with levothyroxine high dose daily. Will just reassess next visit.

## 2014-10-02 NOTE — Progress Notes (Signed)
BP 118/74 mmHg  Pulse 73  Temp(Src) 98.1 F (36.7 C) (Oral)  Ht _0  (1.651 m)  Wt 342 lb 8 oz (155.357 kg)  BMI 56.99 kg/m2  SpO2 95%   CC: medicare wellness visit  Subjective:    Patient ID: [name], female    DOB: 1950-05-31, 64 y.o.   MRN: [id number]  HPI: [name] is a 64 y.o. female presenting on 10/02/2014 for Annual Exam and RLS   Daughter Colletta [place] and granddaughter Hildred Alamin to move to [place]. Pt coming to terms with this.  B12 def - gets shots monthly at home. Requests today here as due.   Hearing screen today Vision screen today 4-5 falls in the past year. Left meniscal tear.  Depression - on med and stable. Sees Dr Albina Billet counselor and psychiatrist, stable on lamictal and vybriid  Preventative: Well woman with OBGYN - normal pap and breast/mammo in past, due for f/u ([facility]). Last mammogram - 2012, normal. Due for rpt. Pt ensures she will get this scheduled Has never had colonoscopy - cannot tolerate prep. Has even not tolerated gatorade/miralax prep in past. Bad gag reflex. Stool kit negative 12/2013. Tdap 2013 Pneumovax 02/2011  Flu shot 02/2014 zostavax - will check with insurance Advanced directive: has at home. Thinks daughter [name] would be [facility] proxy. Will bring [place] a copy  Caffeine: 1 cup coffee/day  Lives with daughter  [facility]. Diet: watching diet, low sodium. Good water. Fruits/vegetables daily.    Relevant past medical, surgical, family and social history reviewed and updated as indicated. Interim medical history since our last visit reviewed. Allergies and medications reviewed and updated. Current Outpatient Prescriptions on File Prior to Visit  Medication Sig  . ALPRAZolam (XANAX) 0.25 MG tablet Take 0.25 mg by mouth daily. For anxiety  . aspirin 81 MG tablet Take 81 mg by mouth every other day.   Marland Kitchen atorvastatin (LIPITOR) 40 MG tablet TAKE 1 TABLET (40 MG TOTAL) BY MOUTH AT  BEDTIME.  . cyanocobalamin (,VITAMIN B-12,) 1000 MCG/ML injection Inject 1 mL (1,000 mcg total) into the muscle every 30 (thirty) days.  . furosemide (LASIX) 40 MG tablet TAKE 1 TABLET BY MOUTH EVERY DAY  . gabapentin (NEURONTIN) 600 MG tablet Take 1,200 mg by mouth at bedtime.  Marland Kitchen KLOR-CON 10 10 MEQ tablet TAKE 2 TABLETS (20 MEQ TOTAL) BY MOUTH DAILY.  Marland Kitchen lamoTRIgine (LAMICTAL) 200 MG tablet Take 200 mg by mouth daily.  Marland Kitchen levothyroxine (SYNTHROID, LEVOTHROID) 112 MCG tablet TAKE 2 TABLETS (224 MCG TOTAL) BY MOUTH DAILY BEFORE BREAKFAST.  . metFORMIN (GLUCOPHAGE) 500 MG tablet TAKE 1 TABLET (500 MG TOTAL) BY MOUTH DAILY WITH BREAKFAST.  . naproxen (NAPROSYN) 500 MG tablet TAKE 1 TABLET (500 MG TOTAL) BY MOUTH DAILY AS NEEDED.  Marland Kitchen NEEDLE, DISP, 25 G (MONOJECT MAGELLAN SAFETY NDL) 25G X 1" MISC by Does not apply route as directed.    . nystatin ointment (MYCOSTATIN) Apply 1 application topically daily as needed. Apply to red areas in groin  . omeprazole (PRILOSEC) 40 MG capsule TAKE 1 CAPSULE (40 MG TOTAL) BY MOUTH DAILY.  . ramipril (ALTACE) 10 MG capsule TAKE 1 CAPSULE (10 MG TOTAL) BY MOUTH DAILY.  Marland Kitchen rOPINIRole (REQUIP) 2 MG tablet TAKE 1 TABLET (2 MG TOTAL) BY MOUTH AT BEDTIME.  Marland Kitchen topiramate (TOPAMAX) 25 MG tablet Take 1 tablet (25 mg total) by mouth 2 (two) times daily.  . traMADol (ULTRAM) 50 MG tablet Take  1 tablet (50 mg total) by mouth every 8 (eight) hours as needed.  . triamcinolone cream (KENALOG) 0.1 % Apply 1 application topically 2 (two) times daily. Apply to AA on external ear, don't use more than 2 weeks at a time.  . Vilazodone HCl (VIIBRYD) 40 MG TABS Take 40 mg by mouth every morning.   No current facility-administered medications on file prior to visit.    Review of Systems  Constitutional: Negative for fever, chills, activity change, appetite change, fatigue and unexpected weight change.  HENT: Negative for hearing loss.   Eyes: Negative for visual disturbance.    Respiratory: Positive for shortness of breath (occasional chronic). Negative for cough, chest tightness and wheezing.   Cardiovascular: Positive for leg swelling. Negative for chest pain and palpitations.  Gastrointestinal: Positive for diarrhea. Negative for nausea, vomiting, abdominal pain, constipation, blood in stool and abdominal distention.  Genitourinary: Negative for hematuria and difficulty urinating.  Musculoskeletal: Negative for myalgias, arthralgias and neck pain.  Skin: Negative for rash.  Neurological: Negative for dizziness, seizures, syncope and headaches.  Hematological: Negative for adenopathy. Does not bruise/bleed easily.  Psychiatric/Behavioral: Negative for dysphoric mood. The patient is not nervous/anxious.    Per HPI unless specifically indicated above     Objective:    BP 118/74 mmHg  Pulse 73  Temp(Src) 98.1 F (36.7 C) (Oral)  Ht _0  (1.651 m)  Wt 342 lb 8 oz (155.357 kg)  BMI 56.99 kg/m2  SpO2 95%  Wt Readings from Last 3 Encounters:  10/02/14 342 lb 8 oz (155.357 kg)  09/18/14 339 lb (153.769 kg)  09/18/14 340 lb (154.223 kg)    Physical Exam  Constitutional: She is oriented to person, place, and time. She appears well-developed and well-nourished. No distress.  Morbidly obese Body mass index is 56.99 kg/(m^2).   HENT:  Head: Normocephalic and atraumatic.  Right Ear: Hearing, tympanic membrane, external ear and ear canal normal.  Left Ear: Hearing, tympanic membrane, external ear and ear canal normal.  Nose: Nose normal.  Mouth/Throat: Uvula is midline, oropharynx is clear and moist and mucous membranes are normal. No oropharyngeal exudate, posterior oropharyngeal edema or posterior oropharyngeal erythema.  Eyes: Conjunctivae and EOM are normal. Pupils are equal, round, and reactive to light. No scleral icterus.  Neck: Normal range of motion. Neck supple. No thyromegaly present.  Cardiovascular: Normal rate, regular rhythm, normal heart sounds  and intact distal pulses.   No murmur heard. Pulses:      Radial pulses are 2+ on the right side, and 2+ on the left side.  Pulmonary/Chest: Effort normal and breath sounds normal. No respiratory distress. She has no wheezes. She has no rales.  Breast - per GYN  Abdominal: Soft. Bowel sounds are normal. She exhibits no distension and no mass. There is no tenderness. There is no rebound and no guarding.  Genitourinary:  Deferred - per GYN  Musculoskeletal: Normal range of motion. She exhibits no edema.  Lymphadenopathy:    She has no cervical adenopathy.  Neurological: She is alert and oriented to person, place, and time.  CN grossly intact, station and gait intact Recall 3/3  calcluation 5/5 serial 3s  Skin: Skin is warm and dry. No rash noted.  Psychiatric: She has a normal mood and affect. Her behavior is normal. Judgment and thought content normal.  Nursing note and vitals reviewed.  Results for orders placed or performed in visit on 09/29/14  Lipid panel  Result Value Ref Range   Cholesterol  153 0 - 200 mg/dL   Triglycerides 164.0 (H) 0.0 - 149.0 mg/dL   HDL 51.10 >39.00 mg/dL   VLDL 32.8 0.0 - 40.0 mg/dL   LDL Cholesterol 69 0 - 99 mg/dL   Total CHOL/HDL Ratio 3    NonHDL 101.90   TSH  Result Value Ref Range   TSH 6.25 (H) 0.35 - 4.50 uIU/mL  Hemoglobin A1c  Result Value Ref Range   Hgb A1c MFr Bld 8.1 (H) 4.6 - 6.5 %  Basic metabolic panel  Result Value Ref Range   Sodium 139 135 - 145 mEq/L   Potassium 3.9 3.5 - 5.1 mEq/L   Chloride 105 96 - 112 mEq/L   CO2 27 19 - 32 mEq/L   Glucose, Bld 200 (H) 70 - 99 mg/dL   BUN 20 6 - 23 mg/dL   Creatinine, Ser 0.85 0.40 - 1.20 mg/dL   Calcium 9.2 8.4 - 10.5 mg/dL   GFR 71.94 >60.00 mL/min  Microalbumin / creatinine urine ratio  Result Value Ref Range   Microalb, Ur 1.9 0.0 - 1.9 mg/dL   Creatinine,U 202.0 mg/dL   Microalb Creat Ratio 0.9 0.0 - 30.0 mg/g  Vitamin B12  Result Value Ref Range   Vitamin B-12 202 (L) 211  - 911 pg/mL      Assessment & Plan:   Problem List Items Addressed This Visit    Advanced care planning/counseling discussion    Advanced directive: has at home. Thinks daughter [name] would be [facility] proxy. Will bring [place] a copy      B12 deficiency    B12 shot today. Gets monthly at home.      Coronary artery disease    Chronic, stable.       Depression    Feels well controlled on current regimen. On vybriid and lamictal.      Diabetes mellitus type 2, uncontrolled    Discussed deteriorated control. Pt admits to dietary indiscretions with increased sugar intake.      Diarrhea   Health maintenance examination    Preventative protocols reviewed and updated unless pt declined. Discussed healthy diet and lifestyle.       Hyperlipidemia    Chronic, stable. Great control on lipitor.       Hypothyroidism    TSH elevated - despite compliance with levothyroxine high dose daily. Will just reassess next visit.      Medicare annual wellness visit, subsequent - Primary    I have personally reviewed the Medicare Annual Wellness questionnaire and have noted 1. The patient's medical and social history 2. Their use of alcohol, tobacco or illicit drugs 3. Their current medications and supplements 4. The patient's functional ability including ADL's, fall risks, home safety risks and hearing or visual impairment. 5. Diet and physical activity 6. Evidence for depression or mood disorders The patients weight, height, BMI have been recorded in the chart.  Hearing and vision has been addressed. I have made referrals, counseling and provided education to the patient based review of the above and I have provided the pt with a written personalized care plan for preventive services. Provider list updated - see scanned questionairre. Reviewed preventative protocols and updated unless pt declined.           Follow up plan: Return in about 3 months (around 01/02/2015), or as needed, for  follow up visit.

## 2014-10-02 NOTE — Addendum Note (Signed)
Addended by: Modena Nunnery on: 10/02/2014 12:18 PM   Modules accepted: Orders

## 2014-10-02 NOTE — Assessment & Plan Note (Signed)
B12 shot today. Gets monthly at home.

## 2014-10-02 NOTE — Assessment & Plan Note (Signed)
Advanced directive: has at home. Thinks daughter Shelda Pal would be [facility] proxy. Will bring [place] a copy

## 2014-10-02 NOTE — Progress Notes (Signed)
Pre visit review using our clinic review tool, if applicable. No additional management support is needed unless otherwise documented below in the visit note. 

## 2014-10-03 LAB — CLOSTRIDIUM DIFFICILE BY PCR: CDIFFPCR: NOT DETECTED

## 2014-10-05 NOTE — Progress Notes (Signed)
Quick Note:  She does not have C diff If still having sxs will need OV me or APP ______

## 2014-10-08 ENCOUNTER — Other Ambulatory Visit: Payer: Self-pay | Admitting: Family Medicine

## 2014-10-08 NOTE — Telephone Encounter (Signed)
Ok to refill 

## 2014-10-23 ENCOUNTER — Other Ambulatory Visit (INDEPENDENT_AMBULATORY_CARE_PROVIDER_SITE_OTHER): Payer: Medicare Other

## 2014-10-23 ENCOUNTER — Encounter: Payer: Self-pay | Admitting: Physician Assistant

## 2014-10-23 ENCOUNTER — Ambulatory Visit (INDEPENDENT_AMBULATORY_CARE_PROVIDER_SITE_OTHER): Payer: Medicare Other | Admitting: Physician Assistant

## 2014-10-23 VITALS — BP 122/70 | HR 76 | Ht 65.0 in | Wt 343.0 lb

## 2014-10-23 DIAGNOSIS — R197 Diarrhea, unspecified: Secondary | ICD-10-CM

## 2014-10-23 DIAGNOSIS — Z1211 Encounter for screening for malignant neoplasm of colon: Secondary | ICD-10-CM

## 2014-10-23 LAB — CBC WITH DIFFERENTIAL/PLATELET
Basophils Absolute: 0 10*3/uL (ref 0.0–0.1)
Basophils Relative: 0.5 % (ref 0.0–3.0)
EOS ABS: 0.2 10*3/uL (ref 0.0–0.7)
EOS PCT: 3.8 % (ref 0.0–5.0)
HCT: 42 % (ref 36.0–46.0)
HEMOGLOBIN: 13.8 g/dL (ref 12.0–15.0)
LYMPHS PCT: 29.2 % (ref 12.0–46.0)
Lymphs Abs: 1.7 10*3/uL (ref 0.7–4.0)
MCHC: 32.8 g/dL (ref 30.0–36.0)
MCV: 90.6 fl (ref 78.0–100.0)
MONO ABS: 0.4 10*3/uL (ref 0.1–1.0)
MONOS PCT: 6.9 % (ref 3.0–12.0)
NEUTROS ABS: 3.4 10*3/uL (ref 1.4–7.7)
NEUTROS PCT: 59.6 % (ref 43.0–77.0)
Platelets: 221 10*3/uL (ref 150.0–400.0)
RBC: 4.64 Mil/uL (ref 3.87–5.11)
RDW: 14.5 % (ref 11.5–15.5)
WBC: 5.7 10*3/uL (ref 4.0–10.5)

## 2014-10-23 LAB — COMPREHENSIVE METABOLIC PANEL
ALT: 30 U/L (ref 0–35)
AST: 24 U/L (ref 0–37)
Albumin: 4 g/dL (ref 3.5–5.2)
Alkaline Phosphatase: 66 U/L (ref 39–117)
BUN: 13 mg/dL (ref 6–23)
CALCIUM: 9.2 mg/dL (ref 8.4–10.5)
CO2: 30 mEq/L (ref 19–32)
CREATININE: 0.83 mg/dL (ref 0.40–1.20)
Chloride: 107 mEq/L (ref 96–112)
GFR: 73.92 mL/min (ref >60.00)
GLUCOSE: 160 mg/dL — AB (ref 70–99)
Potassium: 4.3 mEq/L (ref 3.5–5.1)
Sodium: 142 mEq/L (ref 135–145)
Total Bilirubin: 0.6 mg/dL (ref 0.2–1.2)
Total Protein: 6.4 g/dL (ref 6.0–8.3)

## 2014-10-23 MED ORDER — HYOSCYAMINE SULFATE 0.125 MG SL SUBL: SUBLINGUAL_TABLET | SUBLINGUAL | Status: DC

## 2014-10-23 MED ORDER — POLYETHYLENE GLYCOL 3350 17 GM/SCOOP PO POWD: 1.0000 | Freq: Every day | ORAL | Status: DC

## 2014-10-23 NOTE — Patient Instructions (Signed)
Please go to the basement level to have your labs drawn and stool study. We sent prescriptions to [facility] [address] [place]. 1. Levsin Sublingual 2. Miralax generic You may use Crystal light Lemonade instead of Gatorade. 64 ounces.  You have been scheduled for a colonoscopy. Please follow written instructions given to you at your visit today.  Please pick up your prep supplies at the pharmacy within the next 1-3 days. [facility] [address] If you use inhalers (even only as needed), please bring them with you on the day of your procedure. Your physician has requested that you go to www.startemmi.com and enter the access code given to you at your visit today. This web site gives a general overview about your procedure. However, you should still follow specific instructions given to you by our office regarding your preparation for the procedure.

## 2014-10-24 ENCOUNTER — Encounter: Payer: Self-pay | Admitting: Physician Assistant

## 2014-10-24 NOTE — Progress Notes (Signed)
Patient ID: [name], female   DOB: 1951/02/09, 64 y.o.   MRN: [id number]     History of Present Illness: [name] is a 64 year old female known to Dr. Carlean Purl who was last evaluated here in October 2015 by Caitlyn Bogus, PA-C. She has been seen on 4 occasions to schedule a colonoscopy, but she has been unable to drink the preps due to a bad gag reflex. Because of this she has never had a colonoscopy. She has a past medical history of anxiety depression hyperlipidemia, hypothyroidism, coronary artery disease, morbid obesity, type 2 diabetes mellitus, CHF, restless leg syndrome, arthritis, GERD, hypertension, and obstructive sleep apnea. She had C. difficile in August 2015 and had 3 courses of Flagyl subsequently. Currently, she says her IBS has been flaring and she may go several days without a bowel movement and then have several days of uncontrollable diarrhea. She has had no bright red blood per rectum or melena. Her appetite is good and her weight is been stable. She has no fever, chills, or night sweats. She reports that sometimes when she gets the diarrhea she gets a metallic taste in her mouth. She is off and nauseous when she has the diarrhea but does not vomit. Her stools are sometimes greasy. Her most recent A1c was 7.1.   Past Medical History  Diagnosis Date  . Vitamin B 12 deficiency   . Anxiety   . Depression   . Hyperlipidemia   . Hypothyroidism   . CAD (coronary artery disease)     nonobstructive plaque by cath 2013  . Morbid obesity     seen dietician 09/2011  . T2DM (type 2 diabetes mellitus)   . Vitamin D deficiency     last check 48 (11/2010)  . Fibula fracture   . Fatty liver 2006    [place]  . 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 Chronic diastolic CHF (congestive heart failure) 08/2011    EF 55-60%, nl valves  . RLS (restless legs syndrome)   . Arthritis   . GERD (gastroesophageal reflux disease)   . Hypertension   . Complication of anesthesia      hard time waking up 02 sats 85-89% range   . OSA (obstructive sleep apnea)     medium quattro full face mask CPAP 10cm  . 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 Hypersomnia with sleep apnea, unspecified 01/08/2014    Past Surgical History  Procedure Laterality Date  . Osteotomy and ulnar shortening  2001    [name]  . Knee surgery Left 11-02-2008    Partial medial and lateral Menisectomies and Condroplasty (Dr. [name])  . Cardiac catheterization  10/12/09    EF 70%  . Mr abdomen  01/16/2011    Left 4cm adrenal myelolipoma  . Cardiac catheterization  08/23/2011    Mild nonobstructive CAD, normal LV fxn  . US echocardiography  08/2011    EF of 55-60% and indeterminate diastolic function  . Esophagogastroduodenoscopy  2006  . Ventral hernia repair  03/01/2012    Procedure: LAPAROSCOPIC VENTRAL HERNIA;  Surgeon: Ralene Ok, MD;  Location: [facility];  Service: General;  Laterality: N/A;  . Left heart catheterization with coronary angiogram N/A 08/23/2011    Procedure: LEFT HEART CATHETERIZATION WITH CORONARY ANGIOGRAM;  Surgeon: Sherren Mocha, MD;  Location: [facility] CATH LAB;  Service: Cardiovascular;  Laterality: N/A;   Family History  Problem Relation Age of Onset  . Coronary  artery disease Mother 76    14 angioplasty and 5v CABG  . Stroke Mother   . Diabetes Mother   . Heart attack Father 73  . Heart attack Brother 71    deceased age 77  . Colon cancer Neg Hx   . Colon polyps Neg Hx   . Esophageal cancer Neg Hx   . Kidney disease Neg Hx    History  Substance Use Topics  . Smoking status: Former Smoker    Quit date: 08/08/1997  . Smokeless tobacco: Never Used  . Alcohol Use: No   Current Outpatient Prescriptions  Medication Sig Dispense Refill  . ALPRAZolam (XANAX) 0.25 MG tablet Take 0.25 mg by mouth daily. For anxiety    . aspirin 81 MG tablet Take 81 mg by mouth every other day.     Marland Kitchen atorvastatin (LIPITOR) 40 MG tablet TAKE 1 TABLET (40 MG TOTAL) BY  MOUTH AT BEDTIME. 90 tablet 1  . cyanocobalamin (,VITAMIN B-12,) 1000 MCG/ML injection Inject 1 mL (1,000 mcg total) into the muscle every 30 (thirty) days. 10 mL 3  . furosemide (LASIX) 40 MG tablet TAKE 1 TABLET BY MOUTH EVERY DAY 30 tablet 6  . gabapentin (NEURONTIN) 600 MG tablet Take 1,200 mg by mouth at bedtime.    Marland Kitchen KLOR-CON 10 10 MEQ tablet TAKE 2 TABLETS (20 MEQ TOTAL) BY MOUTH DAILY. 180 tablet 3  . lamoTRIgine (LAMICTAL) 200 MG tablet Take 200 mg by mouth daily.    Marland Kitchen levothyroxine (SYNTHROID, LEVOTHROID) 112 MCG tablet TAKE 2 TABLETS (224 MCG TOTAL) BY MOUTH DAILY BEFORE BREAKFAST. 180 tablet 2  . metFORMIN (GLUCOPHAGE) 500 MG tablet TAKE 1 TABLET (500 MG TOTAL) BY MOUTH DAILY WITH BREAKFAST. 30 tablet 6  . naproxen (NAPROSYN) 500 MG tablet TAKE 1 TABLET (500 MG TOTAL) BY MOUTH DAILY AS NEEDED. 30 tablet 0  . NEEDLE, DISP, 25 G (MONOJECT MAGELLAN SAFETY NDL) 25G X 1" MISC by Does not apply route as directed.      . nystatin ointment (MYCOSTATIN) Apply 1 application topically daily as needed. Apply to red areas in groin 30 g 1  . omeprazole (PRILOSEC) 40 MG capsule TAKE 1 CAPSULE (40 MG TOTAL) BY MOUTH DAILY. 30 capsule 3  . ramipril (ALTACE) 10 MG capsule TAKE 1 CAPSULE (10 MG TOTAL) BY MOUTH DAILY. 90 capsule 1  . rOPINIRole (REQUIP) 2 MG tablet TAKE 1 TABLET (2 MG TOTAL) BY MOUTH AT BEDTIME. 90 tablet 3  . topiramate (TOPAMAX) 25 MG tablet Take 1 tablet (25 mg total) by mouth 2 (two) times daily. 180 tablet 3  . traMADol (ULTRAM) 50 MG tablet Take 1 tablet (50 mg total) by mouth every 8 (eight) hours as needed. 30 tablet 0  . triamcinolone cream (KENALOG) 0.1 % Apply 1 application topically 2 (two) times daily. Apply to AA on external ear, don't use more than 2 weeks at a time. 30 g 0  . Vilazodone HCl (VIIBRYD) 40 MG TABS Take 40 mg by mouth every morning.    . hyoscyamine (LEVSIN/SL) 0.125 MG SL tablet Take 1 tab every 8 hours as needed for cramps and spasms. 90 tablet 1  .  polyethylene glycol powder (GLYCOLAX/MIRALAX) powder Take 255 g by mouth daily. 255 g 0   No current facility-administered medications for this visit.   Allergies  Allergen Reactions  . Augmentin [Amoxicillin-Pot Clavulanate] Diarrhea    Watery diarrhea and abd pain  . Erythromycin Base Nausea And Vomiting  . Neomycin  vomiting     Review of Systems: Per history of present illness otherwise negati   Physical Exam: General: Pleasant, obese, white female in no acute distress Head: Normocephalic and atraumatic Eyes:  sclerae anicteric, conjunctiva pink  Ears: Normal auditory acuity Lungs: Clear throughout to auscultation Heart: Regular rate and rhythm Abdomen: Soft, non distended, non-tender. No masses, no hepatomegaly. Normal bowel sounds Musculoskeletal: Symmetrical with no gross deformities  Extremities: No edema  Neurological: Alert oriented x 4, grossly nonfocal Psychological:  Alert and cooperative. Normal mood and affect  Assessment and Recommendations: #1. Screening colonoscopy. We'll schedule colonoscopy to evaluate for polyps or neoplasia. Patient is worried about a bowel prep and we will give her Rubin Payor lax/to collect prep that she can mix with her Crystal light lemonade.The risks, benefits, and alternatives to colonoscopy with possible biopsy and possible polypectomy were discussed with the patient and they consent to proceed.   #2. Diarrhea. Since likely irritable bowel however she has had C. difficile and so a stool for C. difficile PCR will be of taint. We will also obtain a pancreatic fecal elastase along with a CBC and comprehensive metabolic panel. Patient will be given a trial of Levsin 0.125 mg 1 by mouth every 8 hours when necessary spasm. Pending the findings of her colonoscopy she may be a candidate for a trial of Xifaxan for possible bacterial overgrowth. Further recommendations will be made pending the findings of the above.        Cedrick Partain, [name] 10/24/2014,

## 2014-11-03 NOTE — Progress Notes (Signed)
Agree w/ Ms. Hvozdovic's note and mangement.  

## 2014-11-11 ENCOUNTER — Telehealth: Payer: Self-pay | Admitting: *Deleted

## 2014-11-11 NOTE — Telephone Encounter (Signed)
Form for diabetic testing supplies in your IN box for completion. 

## 2014-11-12 NOTE — Telephone Encounter (Signed)
Form faxed

## 2014-11-12 NOTE — Telephone Encounter (Signed)
Filled and in Kim's box. 

## 2014-11-30 ENCOUNTER — Other Ambulatory Visit: Payer: Self-pay | Admitting: Family Medicine

## 2014-12-07 ENCOUNTER — Telehealth: Payer: Self-pay

## 2014-12-07 NOTE — Telephone Encounter (Signed)
Left a voicemail for patient to return my call, in regards to scheduling a Mammogram.  

## 2014-12-15 ENCOUNTER — Telehealth: Payer: Self-pay | Admitting: Internal Medicine

## 2014-12-15 NOTE — Telephone Encounter (Signed)
Patient is having to move unexpectedly.  She want to cancel the procedure at [facility] for 01/05/15.  She will call back if she wants to reschedule at a later date

## 2014-12-30 ENCOUNTER — Other Ambulatory Visit: Payer: Self-pay | Admitting: Family Medicine

## 2014-12-30 DIAGNOSIS — IMO0002 Reserved for concepts with insufficient information to code with codable children: Secondary | ICD-10-CM

## 2014-12-30 DIAGNOSIS — E039 Hypothyroidism, unspecified: Secondary | ICD-10-CM

## 2014-12-30 DIAGNOSIS — E538 Deficiency of other specified B group vitamins: Secondary | ICD-10-CM

## 2014-12-30 DIAGNOSIS — E1165 Type 2 diabetes mellitus with hyperglycemia: Secondary | ICD-10-CM

## 2014-12-31 ENCOUNTER — Other Ambulatory Visit: Payer: Medicare Other

## 2015-01-05 ENCOUNTER — Encounter (HOSPITAL_COMMUNITY): Payer: Self-pay

## 2015-01-05 ENCOUNTER — Ambulatory Visit (HOSPITAL_COMMUNITY): Admit: 2015-01-05 | Payer: Self-pay | Admitting: Internal Medicine

## 2015-01-05 ENCOUNTER — Telehealth: Payer: Self-pay | Admitting: Family Medicine

## 2015-01-05 ENCOUNTER — Ambulatory Visit: Payer: Medicare Other | Admitting: Family Medicine

## 2015-01-05 DIAGNOSIS — Z0289 Encounter for other administrative examinations: Secondary | ICD-10-CM

## 2015-01-05 SURGERY — COLONOSCOPY
Anesthesia: Monitor Anesthesia Care

## 2015-01-05 NOTE — Telephone Encounter (Signed)
Pt did not come in for their appt today for 3 month follow up. Please let me know if pt needs to be contacted immediately for follow up or no follow up needed. Best phone number to contact pt is [phone].

## 2015-01-05 NOTE — Telephone Encounter (Signed)
Yes plz have her schedule f/u in next 3 months.

## 2015-01-07 NOTE — Telephone Encounter (Signed)
Left message for pt to call back and reschedule appt with Dr. Danise Mina in the next 3 months, per provider.

## 2015-01-12 ENCOUNTER — Ambulatory Visit: Payer: BC Managed Care – PPO | Admitting: Neurology

## 2015-01-13 ENCOUNTER — Other Ambulatory Visit: Payer: Self-pay | Admitting: *Deleted

## 2015-01-13 MED ORDER — NAPROXEN 500 MG PO TABS: ORAL_TABLET | ORAL | Status: DC

## 2015-01-13 MED ORDER — METFORMIN HCL 500 MG PO TABS: ORAL_TABLET | ORAL | Status: DC

## 2015-01-19 ENCOUNTER — Encounter: Payer: Self-pay | Admitting: Family Medicine

## 2015-01-19 NOTE — Telephone Encounter (Signed)
Called 3 times, left messages to return call to reschedule. Sent a letter to reschedule 01/19/15.

## 2015-01-23 ENCOUNTER — Other Ambulatory Visit: Payer: Self-pay | Admitting: Family Medicine

## 2015-01-26 ENCOUNTER — Other Ambulatory Visit (INDEPENDENT_AMBULATORY_CARE_PROVIDER_SITE_OTHER): Payer: Medicare Other

## 2015-01-26 DIAGNOSIS — E1165 Type 2 diabetes mellitus with hyperglycemia: Secondary | ICD-10-CM | POA: Diagnosis not present

## 2015-01-26 DIAGNOSIS — E538 Deficiency of other specified B group vitamins: Secondary | ICD-10-CM | POA: Diagnosis not present

## 2015-01-26 DIAGNOSIS — E039 Hypothyroidism, unspecified: Secondary | ICD-10-CM | POA: Diagnosis not present

## 2015-01-26 DIAGNOSIS — IMO0002 Reserved for concepts with insufficient information to code with codable children: Secondary | ICD-10-CM

## 2015-01-26 LAB — HEMOGLOBIN A1C: Hgb A1c MFr Bld: 9 % — ABNORMAL HIGH (ref 4.6–6.5)

## 2015-01-26 LAB — TSH: TSH: 12.97 u[IU]/mL — ABNORMAL HIGH (ref 0.35–4.50)

## 2015-01-26 LAB — VITAMIN B12: Vitamin B-12: 288 pg/mL (ref 211–911)

## 2015-01-26 LAB — T4, FREE: Free T4: 1.08 ng/dL (ref 0.60–1.60)

## 2015-02-02 ENCOUNTER — Encounter: Payer: Self-pay | Admitting: Family Medicine

## 2015-02-02 ENCOUNTER — Ambulatory Visit (INDEPENDENT_AMBULATORY_CARE_PROVIDER_SITE_OTHER): Payer: Medicare Other | Admitting: Family Medicine

## 2015-02-02 VITALS — BP 126/68 | HR 84 | Temp 98.1°F | Wt 338.5 lb

## 2015-02-02 DIAGNOSIS — F4321 Adjustment disorder with depressed mood: Secondary | ICD-10-CM

## 2015-02-02 DIAGNOSIS — G2581 Restless legs syndrome: Secondary | ICD-10-CM | POA: Diagnosis not present

## 2015-02-02 DIAGNOSIS — E538 Deficiency of other specified B group vitamins: Secondary | ICD-10-CM

## 2015-02-02 DIAGNOSIS — IMO0002 Reserved for concepts with insufficient information to code with codable children: Secondary | ICD-10-CM

## 2015-02-02 DIAGNOSIS — J989 Respiratory disorder, unspecified: Secondary | ICD-10-CM

## 2015-02-02 DIAGNOSIS — E1165 Type 2 diabetes mellitus with hyperglycemia: Secondary | ICD-10-CM

## 2015-02-02 DIAGNOSIS — Z23 Encounter for immunization: Secondary | ICD-10-CM | POA: Diagnosis not present

## 2015-02-02 DIAGNOSIS — E039 Hypothyroidism, unspecified: Secondary | ICD-10-CM | POA: Diagnosis not present

## 2015-02-02 DIAGNOSIS — G4733 Obstructive sleep apnea (adult) (pediatric): Secondary | ICD-10-CM

## 2015-02-02 DIAGNOSIS — Z9989 Dependence on other enabling machines and devices: Secondary | ICD-10-CM

## 2015-02-02 DIAGNOSIS — I5032 Chronic diastolic (congestive) heart failure: Secondary | ICD-10-CM

## 2015-02-02 MED ORDER — LEVOTHYROXINE SODIUM 125 MCG PO TABS: 250.0000 ug | ORAL_TABLET | Freq: Every day | ORAL | Status: DC

## 2015-02-02 MED ORDER — ROPINIROLE HCL 3 MG PO TABS: 3.0000 mg | ORAL_TABLET | Freq: Every day | ORAL | Status: DC

## 2015-02-02 MED ORDER — METFORMIN HCL 500 MG PO TABS: ORAL_TABLET | ORAL | Status: DC

## 2015-02-02 MED ORDER — CYANOCOBALAMIN 1000 MCG/ML IJ SOLN
1000.0000 ug | Freq: Once | INTRAMUSCULAR | Status: AC
  Administered 2015-02-02: 1000 ug via INTRAMUSCULAR

## 2015-02-02 NOTE — Progress Notes (Signed)
Pre visit review using our clinic review tool, if applicable. No additional management support is needed unless otherwise documented below in the visit note. 

## 2015-02-02 NOTE — Progress Notes (Signed)
BP 126/68 mmHg  Pulse 84  Temp(Src) 98.1 F (36.7 C) (Oral)  Wt 338 lb 8 oz (153.543 kg)   CC: 3 mo f/u visit  Subjective:    Patient ID: [name], female    DOB: November 08, 1950, 64 y.o.   MRN: [id number]  HPI: [name] is a 64 y.o. female presenting on 02/02/2015 for Follow-up   Still struggling with daughter/granddaughter's move to [place].  DM - regularly does not check sugars - needs glucose meter.  Compliant with antihyperglycemic regimen which includes: metformin 500mg  with breakfast.  Denies low sugars or hypoglycemic symptoms.  Denies paresthesias. Last diabetic eye exam 02/2013 OVERDUE.  Pneumovax: 2012.  Prevnar: not yet due. Intermittent diarrhea.  Lab Results  Component Value Date   HGBA1C 9.0* 01/26/2015   Diabetic Foot Exam - Simple   Simple Foot Form  Diabetic Foot exam was performed with the following findings:  Yes 02/02/2015  4:45 PM  Visual Inspection  No deformities, no ulcerations, no other skin breakdown bilaterally:  Yes  Sensation Testing  Intact to touch and monofilament testing bilaterally:  Yes  Pulse Check  See comments:  Yes  Comments  Diminished pulses BLE      Obesity - weight down 5 lbs - started new job, "ready to get serious". Restarted herbalife.   Hypothyroidism - TSH worse at 12. Free T4 normal. Complaint with levothyroxine 237mcg daily.   Had colonoscopy scheduled, decided to cancel. Has not scheduled mammogram.   Relevant past medical, surgical, family and social history reviewed and updated as indicated. Interim medical history since our last visit reviewed. Allergies and medications reviewed and updated. Current Outpatient Prescriptions on File Prior to Visit  Medication Sig  . ALPRAZolam (XANAX) 0.25 MG tablet Take 0.25 mg by mouth daily. For anxiety  . aspirin 81 MG tablet Take 81 mg by mouth every other day.   Marland Kitchen atorvastatin (LIPITOR) 40 MG tablet TAKE 1 TABLET (40 MG TOTAL) BY MOUTH AT BEDTIME.  .  cyanocobalamin (,VITAMIN B-12,) 1000 MCG/ML injection Inject 1 mL (1,000 mcg total) into the muscle every 30 (thirty) days.  . furosemide (LASIX) 40 MG tablet TAKE 1 TABLET BY MOUTH EVERY DAY  . gabapentin (NEURONTIN) 600 MG tablet Take 1,200 mg by mouth at bedtime.  Marland Kitchen KLOR-CON 10 10 MEQ tablet TAKE 2 TABLETS (20 MEQ TOTAL) BY MOUTH DAILY.  Marland Kitchen lamoTRIgine (LAMICTAL) 200 MG tablet Take 200 mg by mouth daily.  . naproxen (NAPROSYN) 500 MG tablet TAKE 1 TABLET (500 MG TOTAL) BY MOUTH DAILY AS NEEDED.  Marland Kitchen NEEDLE, DISP, 25 G (MONOJECT MAGELLAN SAFETY NDL) 25G X 1" MISC by Does not apply route as directed.    . nystatin ointment (MYCOSTATIN) Apply 1 application topically daily as needed. Apply to red areas in groin  . omeprazole (PRILOSEC) 40 MG capsule TAKE 1 CAPSULE (40 MG TOTAL) BY MOUTH DAILY.  Marland Kitchen polyethylene glycol powder (GLYCOLAX/MIRALAX) powder Take 255 g by mouth daily.  . ramipril (ALTACE) 10 MG capsule TAKE 1 CAPSULE (10 MG TOTAL) BY MOUTH DAILY.  Marland Kitchen topiramate (TOPAMAX) 25 MG tablet Take 1 tablet (25 mg total) by mouth 2 (two) times daily.  . traMADol (ULTRAM) 50 MG tablet Take 1 tablet (50 mg total) by mouth every 8 (eight) hours as needed.  . triamcinolone cream (KENALOG) 0.1 % Apply 1 application topically 2 (two) times daily. Apply to AA on external ear, don't use more than 2 weeks at a time.  . Vilazodone HCl (VIIBRYD)  40 MG TABS Take 40 mg by mouth every morning.   No current facility-administered medications on file prior to visit.    Review of Systems Per HPI unless specifically indicated above     Objective:    BP 126/68 mmHg  Pulse 84  Temp(Src) 98.1 F (36.7 C) (Oral)  Wt 338 lb 8 oz (153.543 kg)  Wt Readings from Last 3 Encounters:  02/02/15 338 lb 8 oz (153.543 kg)  10/23/14 343 lb (155.584 kg)  10/02/14 342 lb 8 oz (155.357 kg)   Body mass index is 56.33 kg/(m^2).  Physical Exam  Constitutional: She appears well-developed and well-nourished. No distress.    Morbidly obese, mild dyspnea at baseline  HENT:  Head: Normocephalic and atraumatic.  Right Ear: External ear normal.  Left Ear: External ear normal.  Nose: Nose normal.  Mouth/Throat: Oropharynx is clear and moist. No oropharyngeal exudate.  Eyes: Conjunctivae and EOM are normal. Pupils are equal, round, and reactive to light. No scleral icterus.  Neck: Normal range of motion. Neck supple.  Cardiovascular: Normal rate, regular rhythm, normal heart sounds and intact distal pulses.   No murmur heard. Pulmonary/Chest: Effort normal and breath sounds normal. No respiratory distress. She has no wheezes. She has no rales.  Musculoskeletal: She exhibits no edema.  See HPI for foot exam if done  Lymphadenopathy:    She has no cervical adenopathy.  Skin: Skin is warm and dry. No rash noted.  Psychiatric: She has a normal mood and affect.  Nursing note and vitals reviewed.  Results for orders placed or performed in visit on 01/26/15  Hemoglobin A1c  Result Value Ref Range   Hgb A1c MFr Bld 9.0 (H) 4.6 - 6.5 %  Vitamin B12  Result Value Ref Range   Vitamin B-12 288 211 - 911 pg/mL  T4, free  Result Value Ref Range   Free T4 1.08 0.60 - 1.60 ng/dL  TSH  Result Value Ref Range   TSH 12.97 (H) 0.35 - 4.50 uIU/mL      Assessment & Plan:  Encouraged she reschedule colonoscopy and mammogram as she's overdue for f/u. Problem List Items Addressed This Visit    RLS (restless legs syndrome)    Persistent symptoms - increase requip to 3mg  nightly.      OSA on CPAP    Reports compliance with CPAP.      Hypothyroidism - Primary    She takes levothyroxine daily but has not been taking in am 30 min prior to breakfast. Will increase to next high dose 291mcg daily, recheck levels in 4-6 weeks.      Relevant Medications   levothyroxine (SYNTHROID, LEVOTHROID) 125 MCG tablet   Other Relevant Orders   TSH   T4, free   Extreme obesity with respiratory disorder    Again discussed relation  of weight to dyspnea and other disease processes. Pt has regained 60 lb she previously had lost. States she's motivated to start losing weight again - has restarted herbalife. Body mass index is 56.33 kg/(m^2).       Relevant Medications   metFORMIN (GLUCOPHAGE) 500 MG tablet   Diabetes mellitus type 2, uncontrolled    Marked deterioration continues to be noted, A1c 9%.  Discussed glycemic control. rec increase metformin to 500mg  bid, monitor for diarrhea. Advised to check with insurance which glucose meter they have on formulary and we will send in so she can start checking sugars regularly. Advised she schedule eye exam. Diminished pulses felt today -  if persistent, low threshold to obtain ABI.      Relevant Medications   metFORMIN (GLUCOPHAGE) 500 MG tablet   Chronic diastolic heart failure    Continue daily lasix.      B12 deficiency    B12 shot today. Lab Results  Component Value Date   [id number] 594 01/26/2015         Relevant Medications   cyanocobalamin ((VITAMIN B-12)) injection 1,000 mcg (Completed)   Adjustment disorder with depressed mood    Struggling with daughter/grand daughter's upcoming move to [place].  Continues vybriid and lamictal per psychiatrist.        Other Visit Diagnoses    Need for influenza vaccination        Relevant Orders    Flu Vaccine QUAD 36+ mos PF IM (Fluarix & Fluzone Quad PF) (Completed)        Follow up plan: Return in about 3 months (around 05/04/2015), or as needed, for follow up visit.

## 2015-02-02 NOTE — Patient Instructions (Addendum)
Flu shot and b12 shot today. Glucose meter provided today if we have one - check with [employer] what brand is their preferred formulary for test strips and let us know.  Start checking sugars regularly.  Schedule eye exam, mammogram, colonoscopy as you're due.  Increase metformin to twice daily.  Increase thyroid medicine to 268mcg daily.  Return in 3 months for follow up.

## 2015-02-03 ENCOUNTER — Telehealth: Payer: Self-pay | Admitting: Family Medicine

## 2015-02-03 NOTE — Assessment & Plan Note (Signed)
She takes levothyroxine daily but has not been taking in am 30 min prior to breakfast. Will increase to next high dose 230mcg daily, recheck levels in 4-6 weeks.

## 2015-02-03 NOTE — Assessment & Plan Note (Signed)
Continue daily lasix.  

## 2015-02-03 NOTE — Assessment & Plan Note (Signed)
Persistent symptoms - increase requip to 3mg  nightly.

## 2015-02-03 NOTE — Assessment & Plan Note (Signed)
Again discussed relation of weight to dyspnea and other disease processes. Pt has regained 60 lb she previously had lost. States she's motivated to start losing weight again - has restarted herbalife. Body mass index is 56.33 kg/(m^2).

## 2015-02-03 NOTE — Telephone Encounter (Signed)
[employer] has no copay as long as it is [facility]. Pt wants to go to [facility] on [address]  So please call in the best rx there is for the latest meter No specific brand of meter She is interested in the no stick meter  cb number [phone]

## 2015-02-03 NOTE — Assessment & Plan Note (Signed)
Reports compliance with CPAP.

## 2015-02-03 NOTE — Assessment & Plan Note (Signed)
Struggling with daughter/grand daughter's upcoming move to [place].  Continues vybriid and lamictal per psychiatrist.

## 2015-02-03 NOTE — Assessment & Plan Note (Signed)
Marked deterioration continues to be noted, A1c 9%.  Discussed glycemic control. rec increase metformin to 500mg  bid, monitor for diarrhea. Advised to check with insurance which glucose meter they have on formulary and we will send in so she can start checking sugars regularly. Advised she schedule eye exam. Diminished pulses felt today - if persistent, low threshold to obtain ABI.

## 2015-02-03 NOTE — Assessment & Plan Note (Addendum)
B12 shot today. Lab Results  Component Value Date   [id number] 01/26/2015

## 2015-02-04 MED ORDER — ONETOUCH ULTRA SYSTEM W/DEVICE KIT: 1.0000 | PACK | Freq: Once | Status: DC

## 2015-02-04 MED ORDER — FLUCONAZOLE 150 MG PO TABS
150.0000 mg | ORAL_TABLET | ORAL | Status: DC
Start: 2015-02-04 — End: 2015-04-15

## 2015-02-04 MED ORDER — GLUCOSE BLOOD VI STRP: ORAL_STRIP | Status: DC

## 2015-02-04 NOTE — Telephone Encounter (Signed)
I don't think no-stick will be covered by insurance. Also, I don't know of any current reliable no stick meter on the market. sent in one touch meter and test strips for patient.

## 2015-02-04 NOTE — Telephone Encounter (Signed)
Sent in diflucan course. Take 1 tab weekly x2 wks. Hold atorvastatin for 3 days after taking tab.

## 2015-02-04 NOTE — Telephone Encounter (Signed)
Patient notified. Patient was asking if she could have a diflucan for the yeast rash under her breasts and "fat rolls". She said it has gotten worse. She would like this in addition to the nystatin. She said her sister has the same problem and her dr gave that to her to help calm it down in addition to the nystatin and it seemed to work well for her.

## 2015-02-05 NOTE — Telephone Encounter (Signed)
Pt left v/m returning call and request cb [phone].

## 2015-02-05 NOTE — Telephone Encounter (Signed)
Left message to call back  

## 2015-02-05 NOTE — Telephone Encounter (Signed)
Pt notified as instructed and pt voiced understanding. 

## 2015-03-01 ENCOUNTER — Encounter: Payer: Self-pay | Admitting: Family Medicine

## 2015-03-02 ENCOUNTER — Encounter: Payer: Self-pay | Admitting: Family Medicine

## 2015-03-03 ENCOUNTER — Telehealth: Payer: Self-pay | Admitting: Family Medicine

## 2015-03-03 NOTE — Telephone Encounter (Signed)
Patient called asking for Maudie Mercury to call her back about her diabetic meter.  Patient called [facility] and they said they don't have the order for the diabetic meter.

## 2015-03-04 MED ORDER — ONETOUCH ULTRA SYSTEM W/DEVICE KIT: 1.0000 | PACK | Freq: Once | Status: DC

## 2015-03-04 MED ORDER — GLUCOSE BLOOD VI STRP: ORAL_STRIP | Status: DC

## 2015-03-04 NOTE — Telephone Encounter (Signed)
Rx's sent in again. Patient notified.

## 2015-03-04 NOTE — Addendum Note (Signed)
Addended by: Royann Shivers A on: 03/04/2015 01:37 PM   Modules accepted: Orders

## 2015-03-09 ENCOUNTER — Emergency Department
Admission: EM | Admit: 2015-03-09 | Discharge: 2015-03-09 | Disposition: A | Discharge status: Home / Self Care | Payer: Medicare Other | Attending: Student | Admitting: Student

## 2015-03-09 ENCOUNTER — Emergency Department: Payer: Medicare Other

## 2015-03-09 DIAGNOSIS — Z7982 Long term (current) use of aspirin: Secondary | ICD-10-CM | POA: Insufficient documentation

## 2015-03-09 DIAGNOSIS — Z87891 Personal history of nicotine dependence: Secondary | ICD-10-CM | POA: Insufficient documentation

## 2015-03-09 DIAGNOSIS — E119 Type 2 diabetes mellitus without complications: Secondary | ICD-10-CM | POA: Insufficient documentation

## 2015-03-09 DIAGNOSIS — Z79899 Other long term (current) drug therapy: Secondary | ICD-10-CM | POA: Diagnosis not present

## 2015-03-09 DIAGNOSIS — Z791 Long term (current) use of non-steroidal anti-inflammatories (NSAID): Secondary | ICD-10-CM | POA: Diagnosis not present

## 2015-03-09 DIAGNOSIS — I10 Essential (primary) hypertension: Secondary | ICD-10-CM | POA: Diagnosis not present

## 2015-03-09 DIAGNOSIS — M25562 Pain in left knee: Secondary | ICD-10-CM

## 2015-03-09 MED ORDER — OXYCODONE-ACETAMINOPHEN 5-325 MG PO TABS
1.0000 | ORAL_TABLET | Freq: Once | ORAL | Status: AC
  Administered 2015-03-09: 1 via ORAL
  Filled 2015-03-09: qty 1

## 2015-03-09 MED ORDER — OXYCODONE HCL 5 MG PO TABS: 5.0000 mg | ORAL_TABLET | Freq: Four times a day (QID) | ORAL | Status: DC | PRN

## 2015-03-09 MED ORDER — KETOROLAC TROMETHAMINE 60 MG/2ML IM SOLN
60.0000 mg | Freq: Once | INTRAMUSCULAR | Status: AC
  Administered 2015-03-09: 60 mg via INTRAMUSCULAR
  Filled 2015-03-09: qty 2

## 2015-03-09 MED ORDER — IBUPROFEN 600 MG PO TABS: 600.0000 mg | ORAL_TABLET | Freq: Four times a day (QID) | ORAL | Status: DC | PRN

## 2015-03-09 NOTE — ED Provider Notes (Addendum)
[facility] Emergency Department Provider Note  ____________________________________________  Time seen: Approximately 7:09 AM  I have reviewed the triage vital signs and the nursing notes.   HISTORY  Chief Complaint Knee Pain    HPI [name] is a 64 y.o. female hyperlipidemia, coronary artery disease, GERD, hypertension, arthritis who presents for evaluation of less than 12 hours severe left knee and leg pain, gradual onset, constant since onset. Patient denies any trauma in the preceding week. He does report that she fell over 2 weeks ago however her pain did not begin until yesterday. She is additionally having sharp pains in the back of the knee and in the back of the lower leg. She is also feeling some pain in the left back radiating into the left thigh. She does have a history of sciatica as well as arthritis in the left knee. Briefly she had an MRI of the left knee and "there were floaters in there. And it was bone on bone". She took naproxen which only helped her symptoms minimally. No fevers, chills, chest pain, difficulty breathing. Pain is worse with movement. No numbness, weakness, fevers, or bowel incontinence or IV drug use. Chronic urinary incontinence "for years".   Past Medical History  Diagnosis Date  . Vitamin B 12 deficiency   . Anxiety   . Depression   . Hyperlipidemia   . Hypothyroidism   . CAD (coronary artery disease)     nonobstructive plaque by cath 2013  . Morbid obesity     seen dietician 09/2011  . T2DM (type 2 diabetes mellitus)   . Vitamin D deficiency     last check 47 (11/2010)  . Fibula fracture   . Fatty liver 2006    [place]  . 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 Chronic diastolic CHF (congestive heart failure) 08/2011    EF 55-60%, nl valves  . RLS (restless legs syndrome)   . Arthritis   . GERD (gastroesophageal reflux disease)   . Hypertension   . Complication of anesthesia     hard time waking  up 02 sats 85-89% range   . OSA (obstructive sleep apnea)     medium quattro full face mask CPAP 10cm  . 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 Hypersomnia with sleep apnea, unspecified 01/08/2014    Patient Active Problem List   Diagnosis Date Noted  . Medicare annual wellness visit, subsequent 10/02/2014  . Advanced care planning/counseling discussion 10/02/2014  . Health maintenance examination 10/02/2014  . Pain in lower back 07/10/2014  . Imbalance 05/14/2014  . Left knee pain 05/14/2014  . C. difficile colitis 02/13/2014  . Dysuria 02/13/2014  . Diarrhea 01/09/2014  . Hypersomnia with sleep apnea, unspecified 01/08/2014  . Optic neuritis, unspecified 11/06/2012  . RLS (restless legs syndrome)   . Chronic diastolic heart failure ([place]) 09/04/2011  . Coronary artery disease 08/24/2011  . Atypical chest pain 08/20/2011  . INSOMNIA 07/26/2010  . OSA on CPAP 11/08/2009  . Unspecified vitamin D deficiency 07/15/2009  . Hyperlipidemia 06/17/2009  . Diabetes mellitus type 2, uncontrolled ([place]) 05/21/2009  . Extreme obesity with respiratory disorder ([place]) 05/21/2009  . Hypothyroidism 11/12/2006  . B12 deficiency 11/12/2006  . Adjustment disorder with depressed mood 11/12/2006    Past Surgical History  Procedure Laterality Date  . Osteotomy and ulnar shortening  2001    [name]  . Knee surgery Left 11-02-2008    Partial  medial and lateral Menisectomies and Condroplasty (Dr. [name])  . Cardiac catheterization  10/12/09    EF 70%  . Mr abdomen  01/16/2011    Left 4cm adrenal myelolipoma  . Cardiac catheterization  08/23/2011    Mild nonobstructive CAD, normal LV fxn  . US echocardiography  08/2011    EF of 55-60% and indeterminate diastolic function  . Esophagogastroduodenoscopy  2006  . Ventral hernia repair  03/01/2012    Procedure: LAPAROSCOPIC VENTRAL HERNIA;  Surgeon: Ralene Ok, MD;  Location: [facility] ORS;  Service: General;  Laterality: N/A;  . Left heart  catheterization with coronary angiogram N/A 08/23/2011    Procedure: LEFT HEART CATHETERIZATION WITH CORONARY ANGIOGRAM;  Surgeon: Sherren Mocha, MD;  Location[facility] C[facility] LAB;  Service: Cardiovascular;  Laterality: N/A;    Current Outpatient Rx  Name  Route  Sig  Dispense  Refill  . ALPRAZolam (XANAX) 0.25 MG tablet   Oral   Take 0.25 mg by mouth daily. For anxiety         . aspirin 81 MG tablet   Oral   Take 81 mg by mouth every other day.          Marland Kitchen atorvastatin (LIPITOR) 40 MG tablet      TAKE 1 TABLET (40 MG TOTAL) BY MOUTH AT BEDTIME.   90 tablet   2   . Blood Glucose Monitoring Suppl (ONE TOUCH ULTRA SYSTEM KIT) W/DEVICE KIT   Does not apply   1 kit by Does not apply route once. Use to check blood sugar as directed. Dx:E11.65   1 each   0   . cyanocobalamin (,VITAMIN B-12,) 1000 MCG/ML injection   Intramuscular   Inject 1 mL (1,000 mcg total) into the muscle every 30 (thirty) days.   10 mL   3   . fluconazole (DIFLUCAN) 150 MG tablet   Oral   Take 1 tablet (150 mg total) by mouth once a week.   2 tablet   0   . furosemide (LASIX) 40 MG tablet      TAKE 1 TABLET BY MOUTH EVERY DAY   30 tablet   6   . gabapentin (NEURONTIN) 600 MG tablet   Oral   Take 1,200 mg by mouth at bedtime.         Marland Kitchen glucose blood (ONE TOUCH TEST STRIPS) test strip      Use as instructed to check blood sugar once daily and as needed. Dx: E11.65.   100 each   12   . KLOR-CON 10 10 MEQ tablet      TAKE 2 TABLETS (20 MEQ TOTAL) BY MOUTH DAILY.   180 tablet   3   . lamoTRIgine (LAMICTAL) 200 MG tablet   Oral   Take 200 mg by mouth daily.         Marland Kitchen levothyroxine (SYNTHROID, LEVOTHROID) 125 MCG tablet   Oral   Take 2 tablets (250 mcg total) by mouth daily before breakfast.   60 tablet   3   . metFORMIN (GLUCOPHAGE) 500 MG tablet      TAKE 1 TABLET (500 MG TOTAL) BY MOUTH TWICE DAILY WITH FOOD   180 tablet   3   . naproxen (NAPROSYN) 500 MG tablet      TAKE 1  TABLET (500 MG TOTAL) BY MOUTH DAILY AS NEEDED.   90 tablet   0   . NEEDLE, DISP, 25 G (MONOJECT MAGELLAN SAFETY NDL) 25G X 1" MISC   Does  not apply   by Does not apply route as directed.           . nystatin ointment (MYCOSTATIN)   Topical   Apply 1 application topically daily as needed. Apply to red areas in groin   30 g   1   . omeprazole (PRILOSEC) 40 MG capsule      TAKE 1 CAPSULE (40 MG TOTAL) BY MOUTH DAILY.   30 capsule   3   . polyethylene glycol powder (GLYCOLAX/MIRALAX) powder   Oral   Take 255 g by mouth daily.   255 g   0   . ramipril (ALTACE) 10 MG capsule      TAKE 1 CAPSULE (10 MG TOTAL) BY MOUTH DAILY.   90 capsule   1   . rOPINIRole (REQUIP) 3 MG tablet   Oral   Take 1 tablet (3 mg total) by mouth at bedtime.   90 tablet   1   . topiramate (TOPAMAX) 25 MG tablet   Oral   Take 1 tablet (25 mg total) by mouth 2 (two) times daily.   180 tablet   3   . traMADol (ULTRAM) 50 MG tablet   Oral   Take 1 tablet (50 mg total) by mouth every 8 (eight) hours as needed.   30 tablet   0   . triamcinolone cream (KENALOG) 0.1 %   Topical   Apply 1 application topically 2 (two) times daily. Apply to AA on external ear, don't use more than 2 weeks at a time.   30 g   0   . Vilazodone HCl (VIIBRYD) 40 MG TABS   Oral   Take 40 mg by mouth every morning.           Allergies Augmentin; Erythromycin base; and Neomycin  Family History  Problem Relation Age of Onset  . Coronary artery disease Mother 50    14 angioplasty and 5v CABG  . Stroke Mother   . Diabetes Mother   . Heart attack Father 10  . Heart attack Brother 103    deceased age 41  . Colon cancer Neg Hx   . Colon polyps Neg Hx   . Esophageal cancer Neg Hx   . Kidney disease Neg Hx     Social History Social History  Substance Use Topics  . Smoking status: Former Smoker    Quit date: 08/08/1997  . Smokeless tobacco: Never Used  . Alcohol Use: No    Review of  Systems Constitutional: No fever/chills Eyes: No visual changes. ENT: No sore throat. Cardiovascular: Denies chest pain. Respiratory: Denies shortness of breath. Gastrointestinal: No abdominal pain.  No nausea, no vomiting.  No diarrhea.  No constipation. Genitourinary: Negative for dysuria. Musculoskeletal: Negative for back pain. Skin: Negative for rash. Neurological: Negative for headaches, focal weakness or numbness.  10-point ROS otherwise negative.  ____________________________________________   PHYSICAL EXAM:  VITAL SIGNS: ED Triage Vitals  Enc Vitals Group     BP 03/09/15 0652 100/58 mmHg     Pulse Rate 03/09/15 0652 70     Resp 03/09/15 0652 20     Temp 03/09/15 0652 98.1 F (36.7 C)     Temp Source 03/09/15 0652 Oral     SpO2 03/09/15 0652 99 %     Weight 03/09/15 0652 321 lb (145.605 kg)     Height 03/09/15 0652 5' 5"  (1.651 m)     Head Cir --      Peak Flow --  Pain Score 03/09/15 0651 10     Pain Loc --      Pain Edu? --      Excl. in [place]? --     Constitutional: Alert and oriented. Well appearing and in no acute distress. Eyes: Conjunctivae are normal. PERRL. EOMI. Head: Atraumatic. Nose: No congestion/rhinnorhea. Mouth/Throat: Mucous membranes are moist.  Oropharynx non-erythematous. Neck: No stridor.  Cardiovascular: Normal rate, regular rhythm. Grossly normal heart sounds.  Good peripheral circulation. Respiratory: Normal respiratory effort.  No retractions. Lungs CTAB. Gastrointestinal: Soft and nontender. No distention. No abdominal bruits. No CVA tenderness. Genitourinary: deferred Musculoskeletal:  No joint effusions. Left knee without erythema or warmth or induration. Full flexion of the left knee produces pain in the lateral aspect of the thigh and the posterior aspect of the entire leg. She does have some tenderness to palpation in the left calf. Dopplered DP pulse in bilateral feet. Intact extensor mechanism in the left leg. Neurologic:   Normal speech and language. No gross focal neurologic deficits are appreciated. 5 out of 5 dorsiflexion strength in the hallux toes bilaterally. Skin:  Skin is warm, dry and intact. No rash noted. Psychiatric: Mood and affect are normal. Speech and behavior are normal.  ____________________________________________   LABS (all labs ordered are listed, but only abnormal results are displayed)  Labs Reviewed - No data to display ____________________________________________  EKG  none ____________________________________________  RADIOLOGY  Xray left knee IMPRESSION: 1. No acute finding. 2. Tricompartmental osteoarthritis.   Doppler US left leg  IMPRESSION: No evidence of left lower extremity deep venous thrombosis.  ____________________________________________   PROCEDURES  Procedure(s) performed: None  Critical Care performed: No  ____________________________________________   INITIAL IMPRESSION / ASSESSMENT AND PLAN / ED COURSE  Pertinent labs & imaging results that were available during my care of the patient were reviewed by me and considered in my medical decision making (see chart for details).  SAMYRIA RUDIE is a 64 y.o. female hyperlipidemia, coronary artery disease, GERD, hypertension, arthritis who presents for evaluation of less than 12 hours severe left knee and leg pain. On exam, she is generally well-appearing and in no acute distress. Vital signs stable, she is afebrile. She is neurovascularly intact in the left lower extremity. Exam is not consistent with septic arthritis, there is no suspicion for acute arterial vompromise. My suspicion is for possible arthritis exacerbation though there may be a component of sciatica given her complaints of pain in the posterior left thigh however given her recent fall and her tenderness in the left calf, we'll obtain x-ray to rule out acute bony abnormality and venous Doppler ultrasound to rule out DVT. She has an  intact neurological exam, clinical picture is not consistent with cauda equina or epidural abscess. We'll treat her pain. Reassess for disposition.  ----------------------------------------- 10:00 AM on 03/09/2015 -----------------------------------------  Patient with improvement of her pain at this time. She reports that she is now able to easily bend the knee. Ultrasound negative for any evidence of DVT. Plain films notable for osteoarthritis, she also has a suprapatellar intra-articular body which was noted on previous MRI. We discussed need for close orthopedics follow-up, we discussed meticulous return precautions as well as symptomatic support with ibuprofen and very short course of oxycodone for breakthrough pain. She voices understanding and is comfortable with the discharge plan. She'll follow-up with her orthopedic surgeon, Dr. Jefm Bryant as soon as possible. She reports that previously she has had cortisone injections in the knee which have been helpful for  her pain. ____________________________________________   FINAL CLINICAL IMPRESSION(S) / ED DIAGNOSES  Final diagnoses:  Knee pain, acute, left      Joanne Gavel, MD 03/09/15 [place] Harlynn Kimbell, MD 03/09/15 1008

## 2015-03-09 NOTE — ED Notes (Signed)
Patient transported to Ultrasound via stretcher 

## 2015-03-09 NOTE — ED Notes (Signed)
Pt to triage via w/c with no distress noted; pt c/o left knee pain since 8pm with no known injury, hx of same

## 2015-03-09 NOTE — ED Notes (Signed)
Report given to Molly, RN

## 2015-03-31 ENCOUNTER — Encounter: Payer: Self-pay | Admitting: Family Medicine

## 2015-04-05 ENCOUNTER — Ambulatory Visit: Payer: Medicare Other | Admitting: Cardiovascular Disease

## 2015-04-05 ENCOUNTER — Encounter: Payer: Self-pay | Admitting: *Deleted

## 2015-04-06 ENCOUNTER — Encounter: Payer: Self-pay | Admitting: Cardiovascular Disease

## 2015-04-11 ENCOUNTER — Telehealth: Payer: Self-pay | Admitting: Family Medicine

## 2015-04-11 NOTE — Telephone Encounter (Signed)
Received request for preop clearance for L knee arthroscopy and loose body excision by Dr [name] scheduled for 04/28/2015. plz schedule preop clearance appt and notify she needs to see cards for cardiac clearance as well.

## 2015-04-12 NOTE — Telephone Encounter (Signed)
Appts have already been scheduled per patient.

## 2015-04-13 ENCOUNTER — Ambulatory Visit: Payer: Medicare Other | Admitting: Cardiovascular Disease

## 2015-04-13 ENCOUNTER — Encounter: Payer: Self-pay | Admitting: *Deleted

## 2015-04-13 DIAGNOSIS — R0989 Other specified symptoms and signs involving the circulatory and respiratory systems: Secondary | ICD-10-CM

## 2015-04-14 ENCOUNTER — Telehealth: Payer: Self-pay

## 2015-04-14 NOTE — Telephone Encounter (Signed)
Pt has missed appts w/ Dr. Rockey Situ 11/21 & 11/29 for cardiac clearance.   We have received cardiac clearance request from [facility] for pt to proceed w/ left knee scope & loose body excision on 04/28/15. Pt last appt w/ Dr. Rockey Situ was 04/03/14.  We cannot provide clearance at this time, as pt is overdue for yearly f/u.

## 2015-04-14 NOTE — Telephone Encounter (Signed)
Thanks for letting me know. I spoke with her the other day and she was aware of the appointment and the need for it. I'm not sure what happened. She has an acute visit with one of our NP's tomorrow for a rash, so maybe that has something to do with it. She has her clearance appt with Dr. Darnell Level on 04/23/15, so she may just have to postpone her surgery.

## 2015-04-15 ENCOUNTER — Encounter: Payer: Self-pay | Admitting: Primary Care

## 2015-04-15 ENCOUNTER — Telehealth: Payer: Self-pay | Admitting: *Deleted

## 2015-04-15 ENCOUNTER — Encounter: Payer: Self-pay | Admitting: Family Medicine

## 2015-04-15 ENCOUNTER — Ambulatory Visit (INDEPENDENT_AMBULATORY_CARE_PROVIDER_SITE_OTHER): Payer: Medicare Other | Admitting: Primary Care

## 2015-04-15 VITALS — BP 136/80 | HR 69 | Temp 98.1°F | Ht 65.0 in | Wt 337.8 lb

## 2015-04-15 DIAGNOSIS — R21 Rash and other nonspecific skin eruption: Secondary | ICD-10-CM

## 2015-04-15 DIAGNOSIS — B372 Candidiasis of skin and nail: Secondary | ICD-10-CM | POA: Diagnosis not present

## 2015-04-15 MED ORDER — FLUCONAZOLE 100 MG PO TABS: 100.0000 mg | ORAL_TABLET | Freq: Every day | ORAL | Status: DC

## 2015-04-15 MED ORDER — "SYRINGE/NEEDLE (DISP) 25G X 1"" 3 ML MISC": Status: DC

## 2015-04-15 MED ORDER — PREDNISONE 10 MG PO TABS: ORAL_TABLET | ORAL | Status: DC

## 2015-04-15 NOTE — Telephone Encounter (Signed)
Noted agree

## 2015-04-15 NOTE — Progress Notes (Signed)
Pre visit review using our clinic review tool, if applicable. No additional management support is needed unless otherwise documented below in the visit note. 

## 2015-04-15 NOTE — Patient Instructions (Signed)
Start Fluconazole 100 mg tablets for rash under your breasts. Take 1 tablet by mouth daily for 3 weeks.  Start Prednisone tablets for rash to your legs and back. Take 3 tablets daily for 2 days, then 2 tablets for 2 days, then 1 tablet for 2 days.  Please give [place] a call if no improvement   It was a pleasure meeting you!

## 2015-04-15 NOTE — Telephone Encounter (Signed)
See pt email - was message from [facility] or [place]? Thanks

## 2015-04-15 NOTE — Progress Notes (Signed)
Subjective:    Patient ID: [name], female    DOB: October 25, 1950, 64 y.o.   MRN: [id number]  HPI  [name] is a 64 year old female who presents today with a chief complaint of rash. Her rash is located to bilateral lower extremities, posterior trunk, and to her abdomen. Her rash has been present for the past 1.5 weeks. Her rash is itchy and has spread over the past several days. She has a script for triamcinolone cream that she's used on her ears, but not to her body. She visited a friend in the [place] who doesn't have a very clean house.  She also reports redness, pain, and burning under her breasts. She's been applying OTC monistat under her breasts with slight improvement. She's also tried Nystatin Ointment without improvement. The redness and pain have become worse. She has a history of diabetes and yeast rashes under her abdominal folds.  Review of Systems  Constitutional: Negative for fever and chills.  Skin: Positive for rash.       Past Medical History  Diagnosis Date  . Vitamin B 12 deficiency   . Anxiety   . Depression   . Hyperlipidemia   . Hypothyroidism   . CAD (coronary artery disease)     nonobstructive plaque by cath 2013  . Morbid obesity ([place])     seen dietician 09/2011  . T2DM (type 2 diabetes mellitus) ([place])   . Vitamin D deficiency     last check 48 (11/2010)  . Fibula fracture   . Fatty liver 2006    [place]  . 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 Chronic diastolic CHF (congestive heart failure) ([place]) 08/2011    EF 55-60%, nl valves  . RLS (restless legs syndrome)   . Arthritis   . GERD (gastroesophageal reflux disease)   . Hypertension   . Complication of anesthesia     hard time waking up 02 sats 85-89% range   . OSA (obstructive sleep apnea)     medium quattro full face mask CPAP 10cm  . 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 Hypersomnia with sleep apnea, unspecified 01/08/2014  . Osteoarthritis of left knee  2016    nontraumatic loose body with locking, primary localized OA [name])    Social History   Social History  . Marital Status: Divorced    Spouse Name: N/A  . Number of Children: 1  . Years of Education: 13   Occupational History  . Retired   .     Social History Main Topics  . Smoking status: Former Smoker    Quit date: 08/08/1997  . Smokeless tobacco: Never Used  . Alcohol Use: No  . Drug Use: No  . Sexual Activity: No   Other Topics Concern  . Not on file   Social History Narrative   Patient is divorced and lives with her daughter.   Patient has one adult child.   Patient is retired.   Patient has one year of college.   Patient is right-handed.   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   Diet: watching diet, low sodium. Good water.  Fruits/vegetables daily.      Adenosine Myoview nml 08/20/07   HOSP CP R/O'd Nonobstruct CAD by Cath EF 60% 5/27-10/13/2009   CATH and ECHO Nonobstr CAD  EF 60% 10/12/2009  Past Surgical History  Procedure Laterality Date  . Osteotomy and ulnar shortening  2001    [name]  . Knee surgery Left 11-02-2008    Partial medial and lateral Menisectomies and Chondroplasty (Dr. [name])  . Cardiac catheterization  10/12/09    EF 70%  . Mr abdomen  01/16/2011    Left 4cm adrenal myelolipoma  . Cardiac catheterization  08/23/2011    Mild nonobstructive CAD, normal LV fxn  . US echocardiography  08/2011    EF of 55-60% and indeterminate diastolic function  . Esophagogastroduodenoscopy  2006  . Ventral hernia repair  03/01/2012    Procedure: LAPAROSCOPIC VENTRAL HERNIA;  Surgeon: Ralene Ok, MD;  Location: [facility] ORS;  Service: General;  Laterality: N/A;  . Left heart catheterization with coronary angiogram N/A 08/23/2011    Procedure: LEFT HEART CATHETERIZATION WITH CORONARY ANGIOGRAM;  Surgeon: Sherren Mocha, MD;  Location: [facility] CATH LAB;  Service: Cardiovascular;   Laterality: N/A;    Family History  Problem Relation Age of Onset  . Coronary artery disease Mother 47    14 angioplasty and 5v CABG  . Stroke Mother   . Diabetes Mother   . Heart attack Father 56  . Heart attack Brother 32    deceased age 28  . Colon cancer Neg Hx   . Colon polyps Neg Hx   . Esophageal cancer Neg Hx   . Kidney disease Neg Hx     Allergies  Allergen Reactions  . Augmentin [Amoxicillin-Pot Clavulanate] Diarrhea    Watery diarrhea and abd pain  . Erythromycin Base Nausea And Vomiting  . Neomycin     vomiting     Current Outpatient Prescriptions on File Prior to Visit  Medication Sig Dispense Refill  . ALPRAZolam (XANAX) 0.25 MG tablet Take 0.25 mg by mouth daily. For anxiety    . aspirin 81 MG tablet Take 81 mg by mouth every other day.     Marland Kitchen atorvastatin (LIPITOR) 40 MG tablet TAKE 1 TABLET (40 MG TOTAL) BY MOUTH AT BEDTIME. 90 tablet 2  . Blood Glucose Monitoring Suppl (ONE TOUCH ULTRA SYSTEM KIT) W/DEVICE KIT 1 kit by Does not apply route once. Use to check blood sugar as directed. Dx:E11.65 1 each 0  . cyanocobalamin (,VITAMIN B-12,) 1000 MCG/ML injection Inject 1 mL (1,000 mcg total) into the muscle every 30 (thirty) days. 10 mL 3  . furosemide (LASIX) 40 MG tablet TAKE 1 TABLET BY MOUTH EVERY DAY 30 tablet 6  . gabapentin (NEURONTIN) 600 MG tablet Take 1,200 mg by mouth at bedtime.    Marland Kitchen glucose blood (ONE TOUCH TEST STRIPS) test strip Use as instructed to check blood sugar once daily and as needed. Dx: E11.65. 100 each 12  . ibuprofen (ADVIL,MOTRIN) 600 MG tablet Take 1 tablet (600 mg total) by mouth every 6 (six) hours as needed for moderate pain. 15 tablet 0  . KLOR-CON 10 10 MEQ tablet TAKE 2 TABLETS (20 MEQ TOTAL) BY MOUTH DAILY. 180 tablet 3  . lamoTRIgine (LAMICTAL) 200 MG tablet Take 200 mg by mouth daily.    Marland Kitchen levothyroxine (SYNTHROID, LEVOTHROID) 125 MCG tablet Take 2 tablets (250 mcg total) by mouth daily before breakfast. 60 tablet 3  .  metFORMIN (GLUCOPHAGE) 500 MG tablet TAKE 1 TABLET (500 MG TOTAL) BY MOUTH TWICE DAILY WITH FOOD 180 tablet 3  . NEEDLE, DISP, 25 G (MONOJECT MAGELLAN SAFETY NDL) 25G X 1" MISC by Does not apply route as directed.      Marland Kitchen  nystatin ointment (MYCOSTATIN) Apply 1 application topically daily as needed. Apply to red areas in groin 30 g 1  . omeprazole (PRILOSEC) 40 MG capsule TAKE 1 CAPSULE (40 MG TOTAL) BY MOUTH DAILY. 30 capsule 3  . ramipril (ALTACE) 10 MG capsule TAKE 1 CAPSULE (10 MG TOTAL) BY MOUTH DAILY. 90 capsule 1  . rOPINIRole (REQUIP) 3 MG tablet Take 1 tablet (3 mg total) by mouth at bedtime. 90 tablet 1  . topiramate (TOPAMAX) 25 MG tablet Take 1 tablet (25 mg total) by mouth 2 (two) times daily. 180 tablet 3  . triamcinolone cream (KENALOG) 0.1 % Apply 1 application topically 2 (two) times daily. Apply to AA on external ear, don't use more than 2 weeks at a time. 30 g 0  . Vilazodone HCl (VIIBRYD) 40 MG TABS Take 40 mg by mouth every morning.    . traMADol (ULTRAM) 50 MG tablet Take 1 tablet (50 mg total) by mouth every 8 (eight) hours as needed. (Patient not taking: Reported on 04/15/2015) 30 tablet 0   No current facility-administered medications on file prior to visit.    BP 136/80 mmHg  Pulse 69  Temp(Src) 98.1 F (36.7 C) (Oral)  Ht _0  (1.651 m)  Wt 337 lb 12.8 oz (153.225 kg)  BMI 56.21 kg/m2  SpO2 93%    Objective:   Physical Exam  Constitutional: She appears well-nourished.  Cardiovascular: Normal rate and regular rhythm.   Pulmonary/Chest: Effort normal and breath sounds normal.  Skin: Skin is warm and dry.  Large, skin toned, rash with closed raised bumps.  Moderate yeast like appearing rash under breast bilaterally. Slight odor. Very uncomfortable for patient.           Assessment & Plan:  Rash:  2 different appearing rashes noted today.  Yeast rash under breasts, moderate/severe, minor skin breakdown, no obvious infection. No improvement with  nystatin ointment. Start Fluconazole daily x 3 weeks.  Body rash appears like insect bites/bed bugs, but very odd presentation. Present to legs and trunk. Closed, no drainage. Will treat with short taper of prednisone as they are bothersome and itchy. Discussed to watch sugars and yeast rash under breasts as this may alter both.  Follow up PRN.

## 2015-04-15 NOTE — Telephone Encounter (Signed)
Notification from [facility] that levothyroxine manufacturer would be changing. Advised patient to schedule lab appt for 6 weeks after starting new brand.

## 2015-04-18 ENCOUNTER — Other Ambulatory Visit: Payer: Self-pay | Admitting: Family Medicine

## 2015-04-23 ENCOUNTER — Ambulatory Visit: Payer: Medicare Other | Admitting: Family Medicine

## 2015-05-04 ENCOUNTER — Ambulatory Visit (HOSPITAL_BASED_OUTPATIENT_CLINIC_OR_DEPARTMENT_OTHER): Admission: RE | Admit: 2015-05-04 | Payer: Medicare Other | Source: Ambulatory Visit | Admitting: Orthopedic Surgery

## 2015-05-04 ENCOUNTER — Encounter (HOSPITAL_BASED_OUTPATIENT_CLINIC_OR_DEPARTMENT_OTHER): Admission: RE | Payer: Self-pay | Source: Ambulatory Visit

## 2015-05-04 SURGERY — ARTHROSCOPY, KNEE
Anesthesia: General | Site: Knee | Laterality: Left

## 2015-05-13 ENCOUNTER — Other Ambulatory Visit: Payer: Self-pay | Admitting: *Deleted

## 2015-05-13 ENCOUNTER — Other Ambulatory Visit: Payer: Self-pay | Admitting: Family Medicine

## 2015-05-13 MED ORDER — POTASSIUM CHLORIDE ER 10 MEQ PO TBCR: EXTENDED_RELEASE_TABLET | ORAL | Status: DC

## 2015-05-17 ENCOUNTER — Encounter: Payer: Self-pay | Admitting: Family Medicine

## 2015-05-17 ENCOUNTER — Other Ambulatory Visit: Payer: Self-pay | Admitting: Family Medicine

## 2015-05-20 ENCOUNTER — Other Ambulatory Visit: Payer: Self-pay | Admitting: *Deleted

## 2015-05-20 MED ORDER — LEVOTHYROXINE SODIUM 125 MCG PO TABS: ORAL_TABLET | ORAL | Status: DC

## 2015-05-20 MED ORDER — ATORVASTATIN CALCIUM 40 MG PO TABS: ORAL_TABLET | ORAL | Status: DC

## 2015-05-20 MED ORDER — ONETOUCH ULTRASOFT LANCETS MISC: 1.0000 | Status: DC

## 2015-05-20 MED ORDER — POTASSIUM CHLORIDE ER 10 MEQ PO TBCR: EXTENDED_RELEASE_TABLET | ORAL | Status: DC

## 2015-05-20 MED ORDER — NYSTATIN 100000 UNIT/GM EX OINT: 1.0000 "application " | TOPICAL_OINTMENT | Freq: Every day | CUTANEOUS | Status: DC | PRN

## 2015-05-20 MED ORDER — GLUCOSE BLOOD VI STRP: ORAL_STRIP | Status: DC

## 2015-05-20 NOTE — Telephone Encounter (Signed)
Ok to refill 

## 2015-05-21 DIAGNOSIS — R69 Illness, unspecified: Secondary | ICD-10-CM | POA: Diagnosis not present

## 2015-05-24 MED ORDER — TRIAMCINOLONE ACETONIDE 0.1 % EX CREA: 1.0000 "application " | TOPICAL_CREAM | Freq: Two times a day (BID) | CUTANEOUS | Status: DC

## 2015-05-24 MED ORDER — OMEPRAZOLE 40 MG PO CPDR: DELAYED_RELEASE_CAPSULE | ORAL | Status: DC

## 2015-05-24 MED ORDER — ROPINIROLE HCL 3 MG PO TABS
3.0000 mg | ORAL_TABLET | Freq: Every day | ORAL | Status: DC
Start: 2015-05-24 — End: 2015-11-14

## 2015-05-24 NOTE — Addendum Note (Signed)
Addended by: Royann Shivers A on: 05/24/2015 02:37 PM   Modules accepted: Orders

## 2015-06-15 ENCOUNTER — Encounter: Payer: Self-pay | Admitting: Cardiology

## 2015-06-15 ENCOUNTER — Encounter: Payer: Self-pay | Admitting: Cardiovascular Disease

## 2015-06-15 ENCOUNTER — Encounter: Payer: Self-pay | Admitting: Internal Medicine

## 2015-06-15 DIAGNOSIS — H2513 Age-related nuclear cataract, bilateral: Secondary | ICD-10-CM | POA: Diagnosis not present

## 2015-06-15 LAB — HM DIABETES EYE EXAM

## 2015-06-16 LAB — HM DIABETES EYE EXAM

## 2015-06-17 ENCOUNTER — Encounter: Payer: Self-pay | Admitting: Family Medicine

## 2015-06-18 ENCOUNTER — Ambulatory Visit: Payer: Medicare Other | Admitting: Cardiovascular Disease

## 2015-06-22 ENCOUNTER — Ambulatory Visit: Payer: Self-pay | Admitting: Sports Medicine

## 2015-07-02 ENCOUNTER — Encounter: Payer: Self-pay | Admitting: Family Medicine

## 2015-07-02 ENCOUNTER — Ambulatory Visit (INDEPENDENT_AMBULATORY_CARE_PROVIDER_SITE_OTHER): Payer: Medicare HMO | Admitting: Family Medicine

## 2015-07-02 VITALS — BP 138/74 | HR 71 | Temp 97.8°F | Wt 327.5 lb

## 2015-07-02 DIAGNOSIS — E538 Deficiency of other specified B group vitamins: Secondary | ICD-10-CM

## 2015-07-02 DIAGNOSIS — E1165 Type 2 diabetes mellitus with hyperglycemia: Secondary | ICD-10-CM | POA: Diagnosis not present

## 2015-07-02 DIAGNOSIS — IMO0001 Reserved for inherently not codable concepts without codable children: Secondary | ICD-10-CM

## 2015-07-02 DIAGNOSIS — L304 Erythema intertrigo: Secondary | ICD-10-CM | POA: Insufficient documentation

## 2015-07-02 DIAGNOSIS — R21 Rash and other nonspecific skin eruption: Secondary | ICD-10-CM

## 2015-07-02 DIAGNOSIS — E039 Hypothyroidism, unspecified: Secondary | ICD-10-CM | POA: Diagnosis not present

## 2015-07-02 DIAGNOSIS — J989 Respiratory disorder, unspecified: Secondary | ICD-10-CM

## 2015-07-02 DIAGNOSIS — E785 Hyperlipidemia, unspecified: Secondary | ICD-10-CM

## 2015-07-02 LAB — HEMOGLOBIN A1C: HEMOGLOBIN A1C: 8 % — AB (ref 4.6–6.5)

## 2015-07-02 LAB — VITAMIN B12: VITAMIN B 12: 262 pg/mL (ref 211–911)

## 2015-07-02 LAB — LDL CHOLESTEROL, DIRECT: LDL DIRECT: 89 mg/dL

## 2015-07-02 LAB — TSH: TSH: 0.31 u[IU]/mL — AB (ref 0.35–4.50)

## 2015-07-02 LAB — T4, FREE: Free T4: 1.39 ng/dL (ref 0.60–1.60)

## 2015-07-02 MED ORDER — CYANOCOBALAMIN 1000 MCG/ML IJ SOLN
1000.0000 ug | Freq: Once | INTRAMUSCULAR | Status: AC
  Administered 2015-07-02: 1000 ug via INTRAMUSCULAR

## 2015-07-02 MED ORDER — FLUCONAZOLE 150 MG PO TABS: 150.0000 mg | ORAL_TABLET | ORAL | Status: DC

## 2015-07-02 NOTE — Patient Instructions (Addendum)
Labs today then B12 shot today. For rash - treat with diflucan 150mg  once weekly for 4 weeks - hold lipitor 2 days after taking diflucan.  Update me with effect.

## 2015-07-02 NOTE — Assessment & Plan Note (Signed)
Continue atorvastatin. Check dLDL today

## 2015-07-02 NOTE — Assessment & Plan Note (Signed)
Check TFTs 2 mo after formulary change.

## 2015-07-02 NOTE — Progress Notes (Signed)
BP 138/74 mmHg  Pulse 71  Temp(Src) 97.8 F (36.6 C) (Oral)  Wt 327 lb 8 oz (148.553 kg)  SpO2 93%   CC: rash, discuss meds  Subjective:    Patient ID: [name], female    DOB: Oct 13, 1950, 65 y.o.   MRN: [id number]  HPI: CHAYAH MCKEE is a 65 y.o. female presenting on 07/02/2015 for Follow-up and Rash   Rash on back - ongoing for 2 months. Seen by Anda Kraft 04/2015 - dx with yeast like rash + bug bites and treated fluconazole daily x 3 weeks which helped and prednisone orally which helped. Initially on legs. Initially in legs, back, abdomen. Improved but persists on bottom. Treated with TCI cream which has helped. Denies new bed exposure. No similar rashes at home. No new lotions, detergents, soaps or shampoos or medications. Did change levothyroxine formulation over last 2 months.   Down 10 lbs! Involved in [facility] chronic disease management program - has diabetic coach and is having sugars monitored more closely. Estimated A1c 7.9% recently. This week sugars ranged 130-180. Compliant with metformin 513m BID.  Lab Results  Component Value Date   HGBA1C 9.0* 01/26/2015    Hypothyroidism - drug store changed levothyroxine manufacturer. Needs TFTs checked. Has been on new formulation for last 2 months. Lab Results  Component Value Date   TSH 12.97* 01/26/2015    B12 deficiency - [place] shots.  Last b12 shot over 1 month ago.  Lab Results  Component Value Date   [id number] 61909/13/2016    Relevant past medical, surgical, family and social history reviewed and updated as indicated. Interim medical history since our last visit reviewed. Allergies and medications reviewed and updated. Current Outpatient Prescriptions on File Prior to Visit  Medication Sig  . ALPRAZolam (XANAX) 0.25 MG tablet Take 0.25 mg by mouth daily. For anxiety  . aspirin 81 MG tablet Take 81 mg by mouth every other day.   .Marland Kitchenatorvastatin (LIPITOR) 40 MG tablet TAKE 1 TABLET  (40 MG TOTAL) BY MOUTH AT BEDTIME.  .Marland KitchenBlood Glucose Monitoring Suppl (ONE TOUCH ULTRA SYSTEM KIT) W/DEVICE KIT 1 kit by Does not apply route once. Use to check blood sugar as directed. Dx:E11.65  . cyanocobalamin (,VITAMIN B-12,) 1000 MCG/ML injection Inject 1 mL (1,000 mcg total) into the muscle every 30 (thirty) days.  . furosemide (LASIX) 40 MG tablet TAKE 1 TABLET BY MOUTH EVERY DAY  . gabapentin (NEURONTIN) 600 MG tablet Take 1,200 mg by mouth at bedtime.  .Marland Kitchenglucose blood (ONE TOUCH TEST STRIPS) test strip Use as instructed to check blood sugar once daily and as needed. Dx: E11.65.  . ibuprofen (ADVIL,MOTRIN) 600 MG tablet Take 1 tablet (600 mg total) by mouth every 6 (six) hours as needed for moderate pain.  .Marland KitchenlamoTRIgine (LAMICTAL) 200 MG tablet Take 200 mg by mouth daily.  . Lancets (ONETOUCH ULTRASOFT) lancets 1 each by Other route as directed. Use to check sugar daily and as needed. Dx:E11.65  . levothyroxine (SYNTHROID, LEVOTHROID) 125 MCG tablet TAKE 2 TABLETS (250 MCG TOTAL) BY MOUTH DAILY BEFORE BREAKFAST  . metFORMIN (GLUCOPHAGE) 500 MG tablet Take 1 tablet (500 mg total) by mouth 2 (two) times daily with a meal.  . NEEDLE, DISP, 25 G (MONOJECT MAGELLAN SAFETY NDL) 25G X 1" MISC by Does not apply route as directed.    . nystatin ointment (MYCOSTATIN) Apply 1 application topically daily as needed. Apply to red areas in groin  .  omeprazole (PRILOSEC) 40 MG capsule TAKE 1 CAPSULE (40 MG TOTAL) BY MOUTH DAILY.  Marland Kitchen potassium chloride (KLOR-CON 10) 10 MEQ tablet TAKE 2 TABLETS (20 MEQ TOTAL) BY MOUTH DAILY.  . ramipril (ALTACE) 10 MG capsule TAKE 1 CAPSULE (10 MG TOTAL) BY MOUTH DAILY.  Marland Kitchen rOPINIRole (REQUIP) 3 MG tablet Take 1 tablet (3 mg total) by mouth at bedtime.  . SYRINGE-NEEDLE, DISP, 3 ML (B-D 3CC LUER-LOK SYR 25GX1") 25G X 1" 3 ML MISC Used as instructed for B12 injection  . topiramate (TOPAMAX) 25 MG tablet Take 1 tablet (25 mg total) by mouth 2 (two) times daily.  . traMADol  (ULTRAM) 50 MG tablet Take 1 tablet (50 mg total) by mouth every 8 (eight) hours as needed.  . triamcinolone cream (KENALOG) 0.1 % Apply 1 application topically 2 (two) times daily. Apply to AA on external ear, don't use more than 2 weeks at a time.  . Vilazodone HCl (VIIBRYD) 40 MG TABS Take 40 mg by mouth every morning.   No current facility-administered medications on file prior to visit.    Review of Systems Per HPI unless specifically indicated in ROS section     Objective:    BP 138/74 mmHg  Pulse 71  Temp(Src) 97.8 F (36.6 C) (Oral)  Wt 327 lb 8 oz (148.553 kg)  SpO2 93%  Wt Readings from Last 3 Encounters:  07/02/15 327 lb 8 oz (148.553 kg)  04/15/15 337 lb 12.8 oz (153.225 kg)  03/09/15 321 lb (145.605 kg)    Physical Exam  Constitutional: She appears well-developed and well-nourished. No distress.  HENT:  Mouth/Throat: Oropharynx is clear and moist. No oropharyngeal exudate.  Cardiovascular: Normal rate, regular rhythm, normal heart sounds and intact distal pulses.   No murmur heard. Pulmonary/Chest: Effort normal and breath sounds normal. No respiratory distress. She has no wheezes. She has no rales.  Musculoskeletal: She exhibits no edema.  Skin: Skin is warm and dry. Rash noted.  Pruritic papular rash with mild erythema predominantly bilateral buttock, few spots on lower legs Erythema around buttock crease and perianally  Nursing note and vitals reviewed.  Results for orders placed or performed in visit on 06/22/15  HM DIABETES EYE EXAM  Result Value Ref Range   HM Diabetic Eye Exam No Retinopathy No Retinopathy      Assessment & Plan:   Problem List Items Addressed This Visit    Skin rash    Suspicious for candidal skin infection - will treat with diflucan 122m weekly x 4 wks - hold atorvastatin 2d after each dose. Update with effect.      Hypothyroidism    Check TFTs 2 mo after formulary change.      Hyperlipidemia    Continue atorvastatin.  Check dLDL today      Relevant Orders   LDL Cholesterol, Direct   Extreme obesity with respiratory disorder ([place]    Congratulated on weight loss noted. Appreciate [facility] coaching.      Diabetes mellitus type 2, uncontrolled ([place]    Improved control based on cbg's this week. Check A1c today then decide on metformin dosing.      Relevant Orders   Hemoglobin A1c   B12 deficiency    b12 level then B12 shot today. I've asked her [place] to get shots approved. Check IF today.      Relevant Orders   Intrinsic Factor Antibodies   Vitamin B12  Follow up plan: No Follow-up on file.

## 2015-07-02 NOTE — Assessment & Plan Note (Signed)
Suspicious for candidal skin infection - will treat with diflucan 150mg  weekly x 4 wks - hold atorvastatin 2d after each dose. Update with effect.

## 2015-07-02 NOTE — Progress Notes (Signed)
Pre visit review using our clinic review tool, if applicable. No additional management support is needed unless otherwise documented below in the visit note. 

## 2015-07-02 NOTE — Assessment & Plan Note (Signed)
b12 level then B12 shot today. I've asked her to bring [place] paper work she received to get shots approved. Check IF today.

## 2015-07-02 NOTE — Assessment & Plan Note (Signed)
Congratulated on weight loss noted. Appreciate [facility] coaching.

## 2015-07-02 NOTE — Assessment & Plan Note (Addendum)
Improved control based on cbg's this week. Check A1c today then decide on metformin dosing.

## 2015-07-02 NOTE — Addendum Note (Signed)
Addended by: Modena Nunnery on: 07/02/2015 11:23 AM   Modules accepted: Orders

## 2015-07-06 LAB — INTRINSIC FACTOR ANTIBODIES: INTRINSIC FACTOR: NEGATIVE

## 2015-07-09 ENCOUNTER — Other Ambulatory Visit: Payer: Self-pay | Admitting: Family Medicine

## 2015-07-09 ENCOUNTER — Encounter: Payer: Self-pay | Admitting: Family Medicine

## 2015-07-09 MED ORDER — LEVOTHYROXINE SODIUM 112 MCG PO TABS: ORAL_TABLET | ORAL | Status: DC

## 2015-07-09 MED ORDER — METFORMIN HCL 850 MG PO TABS: 850.0000 mg | ORAL_TABLET | Freq: Two times a day (BID) | ORAL | Status: DC

## 2015-07-20 ENCOUNTER — Encounter: Payer: Self-pay | Admitting: Family Medicine

## 2015-07-20 DIAGNOSIS — IMO0001 Reserved for inherently not codable concepts without codable children: Secondary | ICD-10-CM

## 2015-07-20 DIAGNOSIS — J989 Respiratory disorder, unspecified: Secondary | ICD-10-CM

## 2015-07-20 DIAGNOSIS — E1165 Type 2 diabetes mellitus with hyperglycemia: Principal | ICD-10-CM

## 2015-07-21 NOTE — Telephone Encounter (Signed)
Please see Mychart message.

## 2015-07-22 ENCOUNTER — Telehealth: Payer: Self-pay | Admitting: Family Medicine

## 2015-07-22 ENCOUNTER — Other Ambulatory Visit: Payer: Self-pay

## 2015-07-22 ENCOUNTER — Telehealth: Payer: Self-pay | Admitting: Internal Medicine

## 2015-07-22 DIAGNOSIS — Z1159 Encounter for screening for other viral diseases: Secondary | ICD-10-CM

## 2015-07-22 DIAGNOSIS — Z1211 Encounter for screening for malignant neoplasm of colon: Secondary | ICD-10-CM

## 2015-07-22 NOTE — Telephone Encounter (Signed)
Pt called stating her my chart says she needs hep c labs and shingles vaccine Is it ok to schedule?

## 2015-07-22 NOTE — Telephone Encounter (Signed)
Patient is scheduled at [facility] for 10/05/15 8:30 am.  She will come for a pre-visit on 09/27/15 8:00.

## 2015-07-23 NOTE — Telephone Encounter (Signed)
Pt stated she would discuss this with dr g in june

## 2015-07-23 NOTE — Telephone Encounter (Signed)
ok to schedule Hep C lab ordered.

## 2015-07-29 ENCOUNTER — Ambulatory Visit: Payer: Medicare Other | Admitting: Cardiovascular Disease

## 2015-07-30 ENCOUNTER — Ambulatory Visit: Payer: Medicare Other | Admitting: Cardiovascular Disease

## 2015-08-16 ENCOUNTER — Ambulatory Visit (INDEPENDENT_AMBULATORY_CARE_PROVIDER_SITE_OTHER): Payer: Medicare HMO | Admitting: Family Medicine

## 2015-08-16 ENCOUNTER — Encounter: Payer: Self-pay | Admitting: Family Medicine

## 2015-08-16 ENCOUNTER — Other Ambulatory Visit: Payer: Self-pay | Admitting: Family Medicine

## 2015-08-16 VITALS — BP 128/68 | HR 76 | Temp 97.8°F | Wt 326.5 lb

## 2015-08-16 DIAGNOSIS — E1165 Type 2 diabetes mellitus with hyperglycemia: Secondary | ICD-10-CM

## 2015-08-16 DIAGNOSIS — E1140 Type 2 diabetes mellitus with diabetic neuropathy, unspecified: Secondary | ICD-10-CM

## 2015-08-16 DIAGNOSIS — E039 Hypothyroidism, unspecified: Secondary | ICD-10-CM

## 2015-08-16 DIAGNOSIS — Z1159 Encounter for screening for other viral diseases: Secondary | ICD-10-CM | POA: Diagnosis not present

## 2015-08-16 DIAGNOSIS — E538 Deficiency of other specified B group vitamins: Secondary | ICD-10-CM | POA: Diagnosis not present

## 2015-08-16 DIAGNOSIS — IMO0002 Reserved for concepts with insufficient information to code with codable children: Secondary | ICD-10-CM

## 2015-08-16 LAB — BASIC METABOLIC PANEL
BUN: 20 mg/dL (ref 6–23)
CALCIUM: 9.3 mg/dL (ref 8.4–10.5)
CHLORIDE: 103 meq/L (ref 96–112)
CO2: 29 meq/L (ref 19–32)
CREATININE: 0.74 mg/dL (ref 0.40–1.20)
GFR: 84.17 mL/min (ref >60.00)
Glucose, Bld: 164 mg/dL — ABNORMAL HIGH (ref 70–99)
Potassium: 4.1 mEq/L (ref 3.5–5.1)
Sodium: 142 mEq/L (ref 135–145)

## 2015-08-16 LAB — CBC WITH DIFFERENTIAL/PLATELET
BASOS PCT: 0.7 % (ref 0.0–3.0)
Basophils Absolute: 0 10*3/uL (ref 0.0–0.1)
EOS PCT: 3.8 % (ref 0.0–5.0)
Eosinophils Absolute: 0.2 10*3/uL (ref 0.0–0.7)
HCT: 40.4 % (ref 36.0–46.0)
Hemoglobin: 13.5 g/dL (ref 12.0–15.0)
LYMPHS ABS: 1.4 10*3/uL (ref 0.7–4.0)
Lymphocytes Relative: 24.1 % (ref 12.0–46.0)
MCHC: 33.4 g/dL (ref 30.0–36.0)
MCV: 88.1 fl (ref 78.0–100.0)
MONO ABS: 0.4 10*3/uL (ref 0.1–1.0)
Monocytes Relative: 6.2 % (ref 3.0–12.0)
NEUTROS PCT: 65.2 % (ref 43.0–77.0)
Neutro Abs: 3.7 10*3/uL (ref 1.4–7.7)
Platelets: 215 10*3/uL (ref 150.0–400.0)
RBC: 4.59 Mil/uL (ref 3.87–5.11)
RDW: 15.1 % (ref 11.5–15.5)
WBC: 5.7 10*3/uL (ref 4.0–10.5)

## 2015-08-16 LAB — TSH: TSH: 0.47 u[IU]/mL (ref 0.35–4.50)

## 2015-08-16 MED ORDER — CYANOCOBALAMIN 1000 MCG/ML IJ SOLN
1000.0000 ug | Freq: Once | INTRAMUSCULAR | Status: AC
  Administered 2015-08-16: 1000 ug via INTRAMUSCULAR

## 2015-08-16 NOTE — Assessment & Plan Note (Signed)
Concern for diabetic neuropathy as cause of foot pain along with CVI component given better with compression stockings. Discussed with patient. rec space out gabapentin to 600mg  bid, update [place] with effect. Check labs today for other reversible causes of neuropathy.

## 2015-08-16 NOTE — Assessment & Plan Note (Addendum)
Recheck TSH today. Levothyroxine dosing recently changed.

## 2015-08-16 NOTE — Progress Notes (Addendum)
BP 128/68 mmHg  Pulse 76  Temp(Src) 97.8 F (36.6 C) (Oral)  Wt 326 lb 8 oz (148.099 kg)   CC: foot pain  Subjective:    Patient ID: [name], female    DOB: 1951-04-02, 65 y.o.   MRN: [id number]  HPI: [name] is a 65 y.o. female presenting on 08/16/2015 for Foot Pain   3 month h/o burning stinging pain dorsal feet. No numbness. Toes not affected. Worse at night, feels pins in feet. Denies EtOH. She already takes gabapentin 1223m nightly mainly for RLS (in combination with requip). She did notice compression stockings help.  ABIs ordered 2015 but never completed.   Diabetic Foot Exam - Simple   Simple Foot Form  Diabetic Foot exam was performed with the following findings:  Yes 08/16/2015  9:33 AM  Visual Inspection  No deformities, no ulcerations, no other skin breakdown bilaterally:  Yes  Sensation Testing  See comments:  Yes  Pulse Check  See comments:  Yes  Comments  Diminished DP pulses bilaterally. Diminished sensation to monofilament testing       Lab Results  Component Value Date   VITAMINB12 262 07/02/2015   Lab Results  Component Value Date   TSH 0.31* 07/02/2015    Relevant past medical, surgical, family and social history reviewed and updated as indicated. Interim medical history since our last visit reviewed. Allergies and medications reviewed and updated. Current Outpatient Prescriptions on File Prior to Visit  Medication Sig  . ALPRAZolam (XANAX) 0.25 MG tablet Take 0.25 mg by mouth daily. For anxiety  . aspirin 81 MG tablet Take 81 mg by mouth every other day.   .Marland Kitchenatorvastatin (LIPITOR) 40 MG tablet TAKE 1 TABLET (40 MG TOTAL) BY MOUTH AT BEDTIME.  .Marland KitchenBlood Glucose Monitoring Suppl (ONE TOUCH ULTRA SYSTEM KIT) W/DEVICE KIT 1 kit by Does not apply route once. Use to check blood sugar as directed. Dx:E11.65  . cyanocobalamin (,VITAMIN B-12,) 1000 MCG/ML injection Inject 1 mL (1,000 mcg total) into the muscle every 30 (thirty) days.  .  furosemide (LASIX) 40 MG tablet TAKE 1 TABLET BY MOUTH EVERY DAY  . glucose blood (ONE TOUCH TEST STRIPS) test strip Use as instructed to check blood sugar once daily and as needed. Dx: E11.65.  . ibuprofen (ADVIL,MOTRIN) 600 MG tablet Take 1 tablet (600 mg total) by mouth every 6 (six) hours as needed for moderate pain.  .Marland KitchenlamoTRIgine (LAMICTAL) 200 MG tablet Take 200 mg by mouth daily.  . Lancets (ONETOUCH ULTRASOFT) lancets 1 each by Other route as directed. Use to check sugar daily and as needed. Dx:E11.65  . levothyroxine (SYNTHROID, LEVOTHROID) 112 MCG tablet TAKE 2 TABLETS (224 MCG TOTAL) BY MOUTH DAILY BEFORE BREAKFAST  . metFORMIN (GLUCOPHAGE) 850 MG tablet Take 1 tablet (850 mg total) by mouth 2 (two) times daily with a meal.  . NEEDLE, DISP, 25 G (MONOJECT MAGELLAN SAFETY NDL) 25G X 1" MISC by Does not apply route as directed.    . nystatin ointment (MYCOSTATIN) Apply 1 application topically daily as needed. Apply to red areas in groin  . omeprazole (PRILOSEC) 40 MG capsule TAKE 1 CAPSULE (40 MG TOTAL) BY MOUTH DAILY.  .Marland Kitchenpotassium chloride (KLOR-CON 10) 10 MEQ tablet TAKE 2 TABLETS (20 MEQ TOTAL) BY MOUTH DAILY.  . ramipril (ALTACE) 10 MG capsule TAKE 1 CAPSULE (10 MG TOTAL) BY MOUTH DAILY.  .Marland KitchenrOPINIRole (REQUIP) 3 MG tablet Take 1 tablet (3 mg total) by mouth at  bedtime.  . SYRINGE-NEEDLE, DISP, 3 ML (B-D 3CC LUER-LOK SYR 25GX1") 25G X 1" 3 ML MISC Used as instructed for B12 injection  . topiramate (TOPAMAX) 25 MG tablet Take 1 tablet (25 mg total) by mouth 2 (two) times daily.  . traMADol (ULTRAM) 50 MG tablet Take 1 tablet (50 mg total) by mouth every 8 (eight) hours as needed.  . triamcinolone cream (KENALOG) 0.1 % Apply 1 application topically 2 (two) times daily. Apply to AA on external ear, don't use more than 2 weeks at a time.  . Vilazodone HCl (VIIBRYD) 40 MG TABS Take 40 mg by mouth every morning.   No current facility-administered medications on file prior to visit.     Review of Systems Per HPI unless specifically indicated in ROS section     Objective:    BP 128/68 mmHg  Pulse 76  Temp(Src) 97.8 F (36.6 C) (Oral)  Wt 326 lb 8 oz (148.099 kg)  Wt Readings from Last 3 Encounters:  08/16/15 326 lb 8 oz (148.099 kg)  07/02/15 327 lb 8 oz (148.553 kg)  04/15/15 337 lb 12.8 oz (153.225 kg)    Physical Exam  Constitutional: She appears well-developed and well-nourished. No distress.  Musculoskeletal: She exhibits no edema.  Skin: Skin is warm and dry. No rash noted.  Psychiatric: She has a normal mood and affect.  Nursing note and vitals reviewed.  Results for orders placed or performed in visit on 07/02/15  TSH  Result Value Ref Range   TSH 0.31 (L) 0.35 - 4.50 uIU/mL  T4, free  Result Value Ref Range   Free T4 1.39 0.60 - 1.60 ng/dL  Intrinsic Factor Antibodies  Result Value Ref Range   Intrinsic Factor Negative Negative  Vitamin B12  Result Value Ref Range   Vitamin B-12 262 211 - 911 pg/mL  Hemoglobin A1c  Result Value Ref Range   Hgb A1c MFr Bld 8.0 (H) 4.6 - 6.5 %  LDL Cholesterol, Direct  Result Value Ref Range   Direct LDL 89.0 mg/dL      Assessment & Plan:   Problem List Items Addressed This Visit    Hypothyroidism    Recheck TSH today. Levothyroxine dosing recently changed.       Relevant Orders   CBC with Differential/Platelet   TSH   Type 2 diabetes mellitus, uncontrolled, with neuropathy ([place]) - Primary    Concern for diabetic neuropathy as cause of foot pain along with CVI component given better with compression stockings. Discussed with patient. rec space out gabapentin to 637m bid, update [place] effect. Check labs today for other reversible causes of neuropathy.       Relevant Orders   Basic metabolic panel   BO03deficiency    b12 shot today.       Other Visit Diagnoses    Need for hepatitis C screening test            Follow up plan: Return if symptoms worsen or fail to improve.  JRia Bush MD

## 2015-08-16 NOTE — Addendum Note (Signed)
Addended by: Ria Bush on: 08/16/2015 10:16 AM   Modules accepted: Orders

## 2015-08-16 NOTE — Patient Instructions (Addendum)
b12 shot today. labwork today. Try spacing out gabapentin to 600mg  twice daily. Update [place] with effect - we may increase dosing as needed.

## 2015-08-16 NOTE — Assessment & Plan Note (Signed)
b12 shot today. 

## 2015-08-16 NOTE — Addendum Note (Signed)
Addended by: Royann Shivers A on: 08/16/2015 10:29 AM   Modules accepted: Orders

## 2015-08-16 NOTE — Progress Notes (Signed)
Pre visit review using our clinic review tool, if applicable. No additional management support is needed unless otherwise documented below in the visit note. 

## 2015-08-17 ENCOUNTER — Ambulatory Visit (INDEPENDENT_AMBULATORY_CARE_PROVIDER_SITE_OTHER): Payer: Medicare HMO | Admitting: Sports Medicine

## 2015-08-17 ENCOUNTER — Encounter: Payer: Self-pay | Admitting: Sports Medicine

## 2015-08-17 ENCOUNTER — Ambulatory Visit (INDEPENDENT_AMBULATORY_CARE_PROVIDER_SITE_OTHER): Payer: Medicare HMO

## 2015-08-17 VITALS — BP 105/56 | HR 74 | Resp 16

## 2015-08-17 DIAGNOSIS — M79671 Pain in right foot: Secondary | ICD-10-CM

## 2015-08-17 DIAGNOSIS — R2243 Localized swelling, mass and lump, lower limb, bilateral: Secondary | ICD-10-CM

## 2015-08-17 DIAGNOSIS — R252 Cramp and spasm: Secondary | ICD-10-CM | POA: Diagnosis not present

## 2015-08-17 DIAGNOSIS — B351 Tinea unguium: Secondary | ICD-10-CM | POA: Diagnosis not present

## 2015-08-17 DIAGNOSIS — M79672 Pain in left foot: Secondary | ICD-10-CM

## 2015-08-17 DIAGNOSIS — E1165 Type 2 diabetes mellitus with hyperglycemia: Secondary | ICD-10-CM

## 2015-08-17 DIAGNOSIS — E1140 Type 2 diabetes mellitus with diabetic neuropathy, unspecified: Secondary | ICD-10-CM | POA: Diagnosis not present

## 2015-08-17 DIAGNOSIS — M779 Enthesopathy, unspecified: Secondary | ICD-10-CM | POA: Diagnosis not present

## 2015-08-17 DIAGNOSIS — IMO0002 Reserved for concepts with insufficient information to code with codable children: Secondary | ICD-10-CM

## 2015-08-17 LAB — HEPATITIS C ANTIBODY: HCV Ab: NEGATIVE

## 2015-08-17 MED ORDER — TRIAMCINOLONE ACETONIDE 10 MG/ML IJ SUSP: 10.0000 mg | Freq: Once | INTRAMUSCULAR | Status: DC

## 2015-08-17 MED ORDER — DICLOFENAC SODIUM 75 MG PO TBEC: 75.0000 mg | DELAYED_RELEASE_TABLET | Freq: Two times a day (BID) | ORAL | Status: DC

## 2015-08-17 NOTE — Progress Notes (Addendum)
Patient ID: [name], female   DOB: 1951/01/05, 65 y.o.   MRN: [id number] Subjective: [name] is a 65 y.o. female patient with history of diabetes who presents to office today complaining of pain at the top of the Left>right foot; Denies injury/trip/fall/causative factors. Patient also admits to spasm and occassional swelling has not tried anything for it.  Patient states that the glucose reading this morning was not recorded; A1c 8.0. Patient denies any new changes in medication or new problems. Patient denies any new cramping, numbness, burning or tingling in the legs.  Patient Active Problem List   Diagnosis Date Noted  . Skin rash 07/02/2015  . Medicare annual wellness visit, subsequent 10/02/2014  . Advanced care planning/counseling discussion 10/02/2014  . Health maintenance examination 10/02/2014  . Pain in lower back 07/10/2014  . Imbalance 05/14/2014  . Left knee pain 05/14/2014  . C. difficile colitis 02/13/2014  . Dysuria 02/13/2014  . Diarrhea 01/09/2014  . Hypersomnia with sleep apnea, unspecified 01/08/2014  . Optic neuritis, unspecified 11/06/2012  . RLS (restless legs syndrome)   . Chronic diastolic heart failure ([place]) 09/04/2011  . Coronary artery disease 08/24/2011  . Atypical chest pain 08/20/2011  . INSOMNIA 07/26/2010  . OSA on CPAP 11/08/2009  . Unspecified vitamin D deficiency 07/15/2009  . Hyperlipidemia 06/17/2009  . Type 2 diabetes mellitus, uncontrolled, with neuropathy ([place]) 05/21/2009  . Extreme obesity with respiratory disorder ([place]) 05/21/2009  . Hypothyroidism 11/12/2006  . B12 deficiency 11/12/2006  . Adjustment disorder with depressed mood 11/12/2006   Current Outpatient Prescriptions on File Prior to Visit  Medication Sig Dispense Refill  . ALPRAZolam (XANAX) 0.25 MG tablet Take 0.25 mg by mouth daily. For anxiety    . aspirin 81 MG tablet Take 81 mg by mouth every other day.     Marland Kitchen atorvastatin (LIPITOR) 40 MG tablet TAKE 1 TABLET  (40 MG TOTAL) BY MOUTH AT BEDTIME. 90 tablet 2  . Blood Glucose Monitoring Suppl (ONE TOUCH ULTRA SYSTEM KIT) W/DEVICE KIT 1 kit by Does not apply route once. Use to check blood sugar as directed. Dx:E11.65 1 each 0  . cyanocobalamin (,VITAMIN B-12,) 1000 MCG/ML injection Inject 1 mL (1,000 mcg total) into the muscle every 30 (thirty) days. 10 mL 3  . furosemide (LASIX) 40 MG tablet TAKE 1 TABLET BY MOUTH EVERY DAY 30 tablet 6  . gabapentin (NEURONTIN) 600 MG tablet Take 1 tablet (600 mg total) by mouth 2 (two) times daily.    Marland Kitchen glucose blood (ONE TOUCH TEST STRIPS) test strip Use as instructed to check blood sugar once daily and as needed. Dx: E11.65. 100 each 3  . ibuprofen (ADVIL,MOTRIN) 600 MG tablet Take 1 tablet (600 mg total) by mouth every 6 (six) hours as needed for moderate pain. 15 tablet 0  . lamoTRIgine (LAMICTAL) 200 MG tablet Take 200 mg by mouth daily.    . Lancets (ONETOUCH ULTRASOFT) lancets 1 each by Other route as directed. Use to check sugar daily and as needed. Dx:E11.65 100 each 3  . levothyroxine (SYNTHROID, LEVOTHROID) 112 MCG tablet TAKE 2 TABLETS (224 MCG TOTAL) BY MOUTH DAILY BEFORE BREAKFAST 60 tablet 6  . metFORMIN (GLUCOPHAGE) 850 MG tablet Take 1 tablet (850 mg total) by mouth 2 (two) times daily with a meal. 180 tablet 1  . NEEDLE, DISP, 25 G (MONOJECT MAGELLAN SAFETY NDL) 25G X 1" MISC by Does not apply route as directed.      . nystatin ointment (MYCOSTATIN) Apply  1 application topically daily as needed. Apply to red areas in groin 30 g 1  . omeprazole (PRILOSEC) 40 MG capsule TAKE 1 CAPSULE (40 MG TOTAL) BY MOUTH DAILY. 30 capsule 6  . potassium chloride (KLOR-CON 10) 10 MEQ tablet TAKE 2 TABLETS (20 MEQ TOTAL) BY MOUTH DAILY. 180 tablet 3  . ramipril (ALTACE) 10 MG capsule TAKE 1 CAPSULE (10 MG TOTAL) BY MOUTH DAILY. 90 capsule 1  . rOPINIRole (REQUIP) 3 MG tablet Take 1 tablet (3 mg total) by mouth at bedtime. 90 tablet 1  . SYRINGE-NEEDLE, DISP, 3 ML (B-D 3CC  LUER-LOK SYR 25GX1") 25G X 1" 3 ML MISC Used as instructed for B12 injection 15 each 0  . topiramate (TOPAMAX) 25 MG tablet Take 1 tablet (25 mg total) by mouth 2 (two) times daily. 180 tablet 3  . traMADol (ULTRAM) 50 MG tablet Take 1 tablet (50 mg total) by mouth every 8 (eight) hours as needed. 30 tablet 0  . triamcinolone cream (KENALOG) 0.1 % Apply 1 application topically 2 (two) times daily. Apply to AA on external ear, don't use more than 2 weeks at a time. 30 g 0  . Vilazodone HCl (VIIBRYD) 40 MG TABS Take 40 mg by mouth every morning.     No current facility-administered medications on file prior to visit.   Allergies  Allergen Reactions  . Augmentin [Amoxicillin-Pot Clavulanate] Diarrhea    Watery diarrhea and abd pain  . Erythromycin Base Nausea And Vomiting  . Neomycin     vomiting     Recent Results (from the past 2160 hour(s))  HM DIABETES EYE EXAM     Status: None   Collection Time: 06/15/15  2:26 PM  Result Value Ref Range   HM Diabetic Eye Exam No Retinopathy No Retinopathy  HM DIABETES EYE EXAM     Status: None   Collection Time: 06/16/15 12:00 AM  Result Value Ref Range   HM Diabetic Eye Exam No Retinopathy No Retinopathy  TSH     Status: Abnormal   Collection Time: 07/02/15 11:18 AM  Result Value Ref Range   TSH 0.31 (L) 0.35 - 4.50 uIU/mL  T4, free     Status: None   Collection Time: 07/02/15 11:18 AM  Result Value Ref Range   Free T4 1.39 0.60 - 1.60 ng/dL  Intrinsic Factor Antibodies     Status: None   Collection Time: 07/02/15 11:18 AM  Result Value Ref Range   Intrinsic Factor Negative Negative  Vitamin B12     Status: None   Collection Time: 07/02/15 11:18 AM  Result Value Ref Range   Vitamin B-12 262 211 - 911 pg/mL  Hemoglobin A1c     Status: Abnormal   Collection Time: 07/02/15 11:18 AM  Result Value Ref Range   Hgb A1c MFr Bld 8.0 (H) 4.6 - 6.5 %    Comment: Glycemic Control Guidelines for People with Diabetes:Non Diabetic:  <6%Goal of  Therapy: <7%Additional Action Suggested:  >8%   LDL Cholesterol, Direct     Status: None   Collection Time: 07/02/15 11:18 AM  Result Value Ref Range   Direct LDL 89.0 mg/dL    Comment: Optimal:  <100 mg/dLNear or Above Optimal:  100-129 mg/dLBorderline High:  130-159 mg/dLHigh:  160-189 mg/dLVery High:  >190 mg/dL  CBC with Differential/Platelet     Status: None   Collection Time: 08/16/15 10:24 AM  Result Value Ref Range   WBC 5.7 4.0 - 10.5 K/uL   RBC  4.59 3.87 - 5.11 Mil/uL   Hemoglobin 13.5 12.0 - 15.0 g/dL   HCT 40.4 36.0 - 46.0 %   MCV 88.1 78.0 - 100.0 fl   MCHC 33.4 30.0 - 36.0 g/dL   RDW 15.1 11.5 - 15.5 %   Platelets 215.0 150.0 - 400.0 K/uL   Neutrophils Relative % 65.2 43.0 - 77.0 %   Lymphocytes Relative 24.1 12.0 - 46.0 %   Monocytes Relative 6.2 3.0 - 12.0 %   Eosinophils Relative 3.8 0.0 - 5.0 %   Basophils Relative 0.7 0.0 - 3.0 %   Neutro Abs 3.7 1.4 - 7.7 K/uL   Lymphs Abs 1.4 0.7 - 4.0 K/uL   Monocytes Absolute 0.4 0.1 - 1.0 K/uL   Eosinophils Absolute 0.2 0.0 - 0.7 K/uL   Basophils Absolute 0.0 0.0 - 0.1 K/uL  TSH     Status: None   Collection Time: 08/16/15 10:24 AM  Result Value Ref Range   TSH 0.47 0.35 - 4.50 uIU/mL  Basic metabolic panel     Status: Abnormal   Collection Time: 08/16/15 10:24 AM  Result Value Ref Range   Sodium 142 135 - 145 mEq/L   Potassium 4.1 3.5 - 5.1 mEq/L   Chloride 103 96 - 112 mEq/L   CO2 29 19 - 32 mEq/L   Glucose, Bld 164 (H) 70 - 99 mg/dL   BUN 20 6 - 23 mg/dL   Creatinine, Ser 0.74 0.40 - 1.20 mg/dL   Calcium 9.3 8.4 - 10.5 mg/dL   GFR 84.17 >60.00 mL/min  Hepatitis C antibody     Status: None   Collection Time: 08/16/15 10:24 AM  Result Value Ref Range   HCV Ab NEGATIVE NEGATIVE    Objective: General: Patient is awake, alert, and oriented x 3 and in no acute distress.  Integument: Skin is warm, dry and supple bilateral. Nails are short and manicured; no nails bilateral hallux from previous procedure. No signs  of infection. No open lesions or preulcerative lesions present bilateral. Remaining integument unremarkable.  Vasculature:  Dorsalis Pedis pulse 2/4 bilateral. Posterior Tibial pulse  1/4 bilateral.  Capillary fill time <3 sec 1-5 bilateral. Positive hair growth to the level of the digits. Temperature gradient within normal limits. No varicosities present bilateral. 1+ pitting edema present bilateral.   Neurology: The patient has intact sensation measured with a 5.07/10g Semmes Weinstein Monofilament at all pedal sites bilateral . Vibratory sensation diminished bilateral with tuning fork. No Babinski sign present bilateral.   Musculoskeletal: Pain the palpation to Left>Right dorsal lateral foot at peroneus brevis insertion. Pain worse with extension and inversion. Supinated foot type. No symptomatic gross pedal deformities noted bilateral. Muscular strength 5/5 in all lower extremity muscular groups bilateral without pain on range of motion . No tenderness with calf compression bilateral, no warmth.  Xrays right and left foot: Normal osseous mineralization, Enthesopathy at 5th met base and calcaneus, Supinated foot type with 1st ray elevatus and hammertoe, no fracture/disclocation, mild soft tissue swelling.  Assessment and Plan: Problem List Items Addressed This Visit      Endocrine   Type 2 diabetes mellitus, uncontrolled, with neuropathy ([place])   Relevant Orders   DG Foot Complete Left   DG Foot Complete Right    Other Visit Diagnoses    Foot pain, bilateral    -  Primary    Relevant Orders    DG Foot Complete Left    DG Foot Complete Right    Spasm  Ocassional    Localized swelling of both lower legs        likely PVD, +1 pitting edema    Tendonitis        PB insertion bilateral    Relevant Medications    triamcinolone acetonide (KENALOG) 10 MG/ML injection 10 mg    diclofenac (VOLTAREN) 75 MG EC tablet      -Examined patient. -X-rays reviewed -Discussed and educated  patient on diabetic foot care, especially with  regards to the vascular, neurological and musculoskeletal systems.  -Stressed the importance of good glycemic control and the detriment of not  controlling glucose levels in relation to the foot.  -After oral consent and aseptic prep, injected a mixture containing 1 ml of 2%  plain lidocaine, 1 ml 0.5% plain marcaine, 0.5 ml of kenalog 10 and 0.5 ml of dexamethasone phosphate into bilateral peroneus brevis insertion at 5th met base without complication. Post-injection care discussed with patient.  -Rx Diclofenac to take as instructed -Recommend daily icing and stretching -Recommend closely monitor spasms, if persists will consider further work up -Encouraged lower limb elevation, if persists will order venous reflux to eval for PVD and need for compression stockings -Recommend good supportive shoes and inserts daily -Answered all patient questions -Patient to return in 3 weeks for follow up evaluation -Patient advised to call the office if any problems or questions arise in the meantime.  Landis Martins, DPM

## 2015-08-17 NOTE — Patient Instructions (Signed)
Diabetes and Foot Care Diabetes may cause you to have problems because of poor blood supply (circulation) to your feet and legs. This may cause the skin on your feet to become thinner, break easier, and heal more slowly. Your skin may become dry, and the skin may peel and crack. You may also have nerve damage in your legs and feet causing decreased feeling in them. You may not notice minor injuries to your feet that could lead to infections or more serious problems. Taking care of your feet is one of the most important things you can do for yourself.  HOME CARE INSTRUCTIONS  Wear shoes at all times, even in the house. Do not go barefoot. Bare feet are easily injured.  Check your feet daily for blisters, cuts, and redness. If you cannot see the bottom of your feet, use a mirror or ask someone for help.  Wash your feet with warm water (do not use hot water) and mild soap. Then pat your feet and the areas between your toes until they are completely dry. Do not soak your feet as this can dry your skin.  Apply a moisturizing lotion or petroleum jelly (that does not contain alcohol and is unscented) to the skin on your feet and to dry, brittle toenails. Do not apply lotion between your toes.  Trim your toenails straight across. Do not dig under them or around the cuticle. File the edges of your nails with an emery board or nail file.  Do not cut corns or calluses or try to remove them with medicine.  Wear clean socks or stockings every day. Make sure they are not too tight. Do not wear knee-high stockings since they may decrease blood flow to your legs.  Wear shoes that fit properly and have enough cushioning. To break in new shoes, wear them for just a few hours a day. This prevents you from injuring your feet. Always look in your shoes before you put them on to be sure there are no objects inside.  Do not cross your legs. This may decrease the blood flow to your feet.  If you find a minor scrape,  cut, or break in the skin on your feet, keep it and the skin around it clean and dry. These areas may be cleansed with mild soap and water. Do not cleanse the area with peroxide, alcohol, or iodine.  When you remove an adhesive bandage, be sure not to damage the skin around it.  If you have a wound, look at it several times a day to make sure it is healing.  Do not use heating pads or hot water bottles. They may burn your skin. If you have lost feeling in your feet or legs, you may not know it is happening until it is too late.  Make sure your health care provider performs a complete foot exam at least annually or more often if you have foot problems. Report any cuts, sores, or bruises to your health care provider immediately. SEEK MEDICAL CARE IF:   You have an injury that is not healing.  You have cuts or breaks in the skin.  You have an ingrown nail.  You notice redness on your legs or feet.  You feel burning or tingling in your legs or feet.  You have pain or cramps in your legs and feet.  Your legs or feet are numb.  Your feet always feel cold. SEEK IMMEDIATE MEDICAL CARE IF:   There is increasing redness,   swelling, or pain in or around a wound.  There is a red line that goes up your leg.  Pus is coming from a wound.  You develop a fever or as directed by your health care provider.  You notice a bad smell coming from an ulcer or wound.   This information is not intended to replace advice given to you by your health care provider. Make sure you discuss any questions you have with your health care provider.   Document Released: 04/28/2000 Document Revised: 01/01/2013 Document Reviewed: 10/08/2012 Elsevier Interactive Patient Education 2016 [employer]  

## 2015-08-17 NOTE — Progress Notes (Deleted)
   Subjective:    Patient ID: [name], female    DOB: 03/17/1951, 65 y.o.   MRN: [id number]  HPI I HAVE SOME LEFT ANKLE PAIN    Review of Systems  All other systems reviewed and are negative.      Objective:   Physical Exam        Assessment & Plan:

## 2015-08-20 ENCOUNTER — Other Ambulatory Visit: Payer: Self-pay | Admitting: Cardiovascular Disease

## 2015-08-25 ENCOUNTER — Other Ambulatory Visit: Payer: Self-pay | Admitting: Family Medicine

## 2015-09-01 ENCOUNTER — Encounter: Payer: Self-pay | Admitting: Dietician

## 2015-09-01 ENCOUNTER — Encounter: Payer: Medicare HMO | Attending: Family Medicine | Admitting: Dietician

## 2015-09-01 VITALS — Ht 66.5 in | Wt 325.0 lb

## 2015-09-01 DIAGNOSIS — J989 Respiratory disorder, unspecified: Secondary | ICD-10-CM | POA: Diagnosis not present

## 2015-09-01 DIAGNOSIS — E1165 Type 2 diabetes mellitus with hyperglycemia: Secondary | ICD-10-CM | POA: Diagnosis not present

## 2015-09-01 DIAGNOSIS — Z6841 Body Mass Index (BMI) 40.0 and over, adult: Secondary | ICD-10-CM | POA: Diagnosis not present

## 2015-09-01 DIAGNOSIS — E118 Type 2 diabetes mellitus with unspecified complications: Secondary | ICD-10-CM

## 2015-09-01 NOTE — Progress Notes (Signed)
Diabetes Self-Management Education  Visit Type: First/Initial  Appt. Start Time: 0810 Appt. End Time: 0935  09/01/2015  Ms. [name], identified by name and date of birth, is a 65 y.o. female with a diagnosis of Diabetes: Type 2. Other hx includes hyperlipidemia, HTN, hypothyroid, CHF, and vitamin B-12 deficiency.  She is part of the [employer] program through [facility].  She states that her blood sugar has decreased from 220-240 down to 130-160 since beginning this program 3 months ago.  Patient lives with her daughter and 78 yo granddaughter.  The daughter is now mostly plant based except for chicken and the granddaughter is trying to eat healthy.  Family is supportive with healthy eating but there is often no plans made for dinner and they often eat out.  She often skips meals.  She has not been motivated to exercise for the past month.  She enjoys water walking and qualifies for the [employer] program.  She tries to watch her sodium intake but it is often high due to eating out.  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  ASSESSMENT  Height 5' 6.5" (1.689 m), weight 325 lb (147.419 kg). Body mass index is 51.68 kg/(m^2).      Diabetes Self-Management Education - 09/01/15 0824    Visit Information   Visit Type First/Initial   Initial Visit   Diabetes Type Type 2   Are you currently following a meal plan? No   Are you taking your medications as prescribed? Yes   Date Diagnosed about 10 years ago   Health Coping   How would you rate your overall health? Fair   Psychosocial Assessment   Patient Belief/Attitude about Diabetes Defeat/Burnout   Self-care barriers None   Self-management support Doctor's office;Family   Other persons present Patient   Patient Concerns Nutrition/Meal planning;Glycemic Control   Special Needs None   Preferred Learning Style No preference indicated   Learning Readiness Ready   How often do you need to have someone help  you when you read instructions, pamphlets, or other written materials from your doctor or pharmacy? 1 - Never   What is the last grade level you completed in school? 1 year college   Complications   Last HgB A1C per patient/outside source 8 %  07/02/15   How often do you check your blood sugar? 1-2 times/day   Fasting Blood glucose range (mg/dL) 130-179   Postprandial Blood glucose range (mg/dL) 70-129;130-179   Number of hypoglycemic episodes per month 0   Number of hyperglycemic episodes per week 0   Have you had a dilated eye exam in the past 12 months? Yes   Have you had a dental exam in the past 12 months? No   Are you checking your feet? No   Dietary Intake   Breakfast scambled egg sandwich today but usually skips OR rare Herbal life (made at home)   Snack (morning) rare   Lunch none or [place] and cheese and fruit   Snack (afternoon) fruit or protein bar   Dinner [place] take out OR PB sandwich (white wheat) OR cereal OR 2x/week spagetti made with [place] or grilled chicken   Snack (evening) chips or PB and apple or Graham crackers and PB   Beverage(s) water, crystal light, diet pepsi, coffee with splenda   Exercise   Exercise Type ADL's   How many days per week to you exercise? 0   How many minutes per day do  you exercise? 0   Total minutes per week of exercise 0   Patient Education   Previous Diabetes Education Yes (please comment)  10 years ago   Disease state  Definition of diabetes, type 1 and 2, and the diagnosis of diabetes   Nutrition management  Role of diet in the treatment of diabetes and the relationship between the three main macronutrients and blood glucose level;Meal options for control of blood glucose level and chronic complications.;Food label reading, portion sizes and measuring food.;Meal timing in regards to the patients' current diabetes medication.   Physical activity and exercise  Role of exercise on diabetes management, blood pressure control and cardiac  health.   Monitoring Interpreting lab values - A1C, lipid, urine microalbumina.   Psychosocial adjustment Worked with patient to identify barriers to care and solutions   Individualized Goals (developed by patient)   Nutrition General guidelines for healthy choices and portions discussed;Follow meal plan discussed   Physical Activity Exercise 3-5 times per week;30 minutes per day   Medications take my medication as prescribed   Monitoring  test my blood glucose as discussed   Reducing Risk examine blood glucose patterns   Outcomes   Expected Outcomes Demonstrated interest in learning. Expect positive outcomes   Future DMSE PRN   Program Status Completed      Individualized Plan for Diabetes Self-Management Training:   Learning Objective:  Patient will have a greater understanding of diabetes self-management. Patient education plan is to attend individual and/or group sessions per assessed needs and concerns.   Plan:   Patient Instructions  Don't skip meals.  Breakfast, lunch, dinner daily.   Add the exercise habit.  Pool exercises.  Silver Sneakers. Aim for 2-3 carbohydrate choices per meal (30-45 grams) Aim for 0-1 carb choices per snack if hungry. Small amounts of protein with each meal and snack.   Expected Outcomes:  Demonstrated interest in learning. Expect positive outcomes  Education material provided: Living Well with Diabetes, Food label handouts, A1C conversion sheet, Meal plan card, My Plate, Snack sheet and Support group flyer, breakfast ideas.  If problems or questions, patient to contact team via:  Phone and Email  Future DSME appointment: PRN

## 2015-09-01 NOTE — Patient Instructions (Addendum)
Don't skip meals.  Breakfast, lunch, dinner daily.   Add the exercise habit.  Pool exercises.  Silver Sneakers. Aim for 2-3 carbohydrate choices per meal (30-45 grams) Aim for 0-1 carb choices per snack if hungry. Small amounts of protein with each meal and snack.

## 2015-09-14 ENCOUNTER — Ambulatory Visit: Payer: Medicare HMO | Admitting: Sports Medicine

## 2015-09-17 ENCOUNTER — Ambulatory Visit (INDEPENDENT_AMBULATORY_CARE_PROVIDER_SITE_OTHER): Payer: Medicare HMO | Admitting: Cardiovascular Disease

## 2015-09-17 ENCOUNTER — Encounter: Payer: Self-pay | Admitting: Cardiovascular Disease

## 2015-09-17 VITALS — BP 120/64 | HR 69 | Ht 65.5 in | Wt 327.0 lb

## 2015-09-17 DIAGNOSIS — E1165 Type 2 diabetes mellitus with hyperglycemia: Secondary | ICD-10-CM

## 2015-09-17 DIAGNOSIS — Z9989 Dependence on other enabling machines and devices: Secondary | ICD-10-CM

## 2015-09-17 DIAGNOSIS — IMO0002 Reserved for concepts with insufficient information to code with codable children: Secondary | ICD-10-CM

## 2015-09-17 DIAGNOSIS — E1140 Type 2 diabetes mellitus with diabetic neuropathy, unspecified: Secondary | ICD-10-CM

## 2015-09-17 DIAGNOSIS — I2510 Atherosclerotic heart disease of native coronary artery without angina pectoris: Secondary | ICD-10-CM | POA: Diagnosis not present

## 2015-09-17 DIAGNOSIS — I5032 Chronic diastolic (congestive) heart failure: Secondary | ICD-10-CM | POA: Diagnosis not present

## 2015-09-17 DIAGNOSIS — G4733 Obstructive sleep apnea (adult) (pediatric): Secondary | ICD-10-CM | POA: Diagnosis not present

## 2015-09-17 DIAGNOSIS — R0789 Other chest pain: Secondary | ICD-10-CM

## 2015-09-17 DIAGNOSIS — E785 Hyperlipidemia, unspecified: Secondary | ICD-10-CM

## 2015-09-17 NOTE — Assessment & Plan Note (Signed)
Significant leg cramping, recommended she hold her Lasix If cramping gets better, suggested she call us. Options would be to change to Crestor

## 2015-09-17 NOTE — Assessment & Plan Note (Signed)
We have encouraged continued exercise, careful diet management in an effort to lose weight. 

## 2015-09-17 NOTE — Assessment & Plan Note (Signed)
Currently with no symptoms of angina. No further workup at this time. Continue current medication regimen. Strong family history

## 2015-09-17 NOTE — Progress Notes (Signed)
 Patient ID: [name], female    DOB: 07/26/1950, 65 y.o.   MRN: [id number]  HPI Comments: Ms Armstead is a very pleasant 65-year-old woman with history of obesity, hyperlipidemia, hypothyroidism, diabetes who presents for routine followup of her hyperlipidemia, diabetes as well as history of chest pain in the past. Long smoking history for 30 years, stopped at age 65. She has sleep apnea, wears CPAP,  restless leg syndrome  In follow-up, she reports that she is doing well. 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 No regular exercise program. As on the previous visits, rare fleeting chest pain at rest, nothing with exertion She does have minimal ankle edema  Biggest complaint is chronic leg cramping at rest and sometimes on exertion  EKG on today's visit shows normal sinus rhythm with rate 69 bpm, no significant ST or T-wave changes  In follow-up today, she reports that she is feeling well. Denies having any significant chest pain with exertion. Weight has been a major issue. Weight is up 20 pounds from her prior clinic visit Rare fleeting chest discomfort, at rest. Nothing in particular with exertion. She takes care of 2 children for several hours a day. No regular exercise. Minimal lower extremity edema.  EKG shows normal sinus rhythm with no significant ST or T-wave changes  Review of blood work with her today shows TSH now in the 3 range, improved from previous labs  Other past medical history She was evaluated in the hospital July 2014 for chest pain. Cardiac enzymes negative, stress test also showed no ischemia. Previous episode of diastolic CHF. Prior echocardiogram in 2013 showing normal ejection fraction.        Allergies  Allergen Reactions  . Augmentin [Amoxicillin-Pot Clavulanate] Diarrhea    Watery diarrhea and abd pain  . Erythromycin Base Nausea  And Vomiting  . Neomycin     vomiting     Current Outpatient Prescriptions on File Prior to Visit  Medication Sig Dispense Refill  . ALPRAZolam (XANAX) 0.25 MG tablet Take 0.25 mg by mouth daily. For anxiety    . aspirin 81 MG tablet Take 81 mg by mouth every other day.     . atorvastatin (LIPITOR) 40 MG tablet TAKE 1 TABLET (40 MG TOTAL) BY MOUTH AT BEDTIME. 90 tablet 2  . Blood Glucose Monitoring Suppl (ONE TOUCH ULTRA SYSTEM KIT) W/DEVICE KIT 1 kit by Does not apply route once. Use to check blood sugar as directed. Dx:E11.65 1 each 0  . cyanocobalamin (,VITAMIN B-12,) 1000 MCG/ML injection Inject 1 mL (1,000 mcg total) into the muscle every 30 (thirty) days. 10 mL 3  . furosemide (LASIX) 40 MG tablet TAKE 1 TABLET BY MOUTH EVERY DAY 30 tablet 0  . gabapentin (NEURONTIN) 600 MG tablet Take 1 tablet (600 mg total) by mouth 2 (two) times daily.    . glucose blood (ONE TOUCH TEST STRIPS) test strip Use as instructed to check blood sugar once daily and as needed. Dx: E11.65. 100 each 3  . ibuprofen (ADVIL,MOTRIN) 600 MG tablet Take 1 tablet (600 mg total) by mouth every 6 (six) hours as needed for moderate pain. 15 tablet 0  . lamoTRIgine (LAMICTAL) 200 MG tablet Take 200 mg by mouth daily.    . Lancets (ONETOUCH ULTRASOFT) lancets 1 each by Other route as directed. Use to check sugar daily and as needed. Dx:E11.65   100 each 3  . levothyroxine (SYNTHROID, LEVOTHROID) 112 MCG tablet TAKE 2 TABLETS (224 MCG TOTAL) BY MOUTH DAILY BEFORE BREAKFAST 60 tablet 6  . metFORMIN (GLUCOPHAGE) 850 MG tablet Take 1 tablet (850 mg total) by mouth 2 (two) times daily with a meal. 180 tablet 1  . NEEDLE, DISP, 25 G (MONOJECT MAGELLAN SAFETY NDL) 25G X 1" MISC by Does not apply route as directed.      . nystatin ointment (MYCOSTATIN) Apply 1 application topically daily as needed. Apply to red areas in groin 30 g 1  . omeprazole (PRILOSEC) 40 MG capsule TAKE 1 CAPSULE (40 MG TOTAL) BY MOUTH DAILY. 30 capsule 6  .  potassium chloride (KLOR-CON 10) 10 MEQ tablet TAKE 2 TABLETS (20 MEQ TOTAL) BY MOUTH DAILY. 180 tablet 3  . ramipril (ALTACE) 10 MG capsule TAKE 1 CAPSULE (10 MG TOTAL) BY MOUTH DAILY. 90 capsule 1  . rOPINIRole (REQUIP) 3 MG tablet Take 1 tablet (3 mg total) by mouth at bedtime. 90 tablet 1  . SYRINGE-NEEDLE, DISP, 3 ML (B-D 3CC LUER-LOK SYR 25GX1") 25G X 1" 3 ML MISC Used as instructed for B12 injection 15 each 0  . traMADol (ULTRAM) 50 MG tablet Take 1 tablet (50 mg total) by mouth every 8 (eight) hours as needed. 30 tablet 0  . triamcinolone cream (KENALOG) 0.1 % APPLY TO AFFECTED AREA ON EXTERNAL EAR TWICE DAILY. DO NOT USE FOR MORE THAN 2 WEEKS AT A TIME 30 g 0  . Vilazodone HCl (VIIBRYD) 40 MG TABS Take 40 mg by mouth every morning.     Current Facility-Administered Medications on File Prior to Visit  Medication Dose Route Frequency Provider Last Rate Last Dose  . triamcinolone acetonide (KENALOG) 10 MG/ML injection 10 mg  10 mg Other Once Landis Martins, DPM        Past Medical History  Diagnosis Date  . Vitamin B 12 deficiency     IF negative  . Anxiety   . Depression   . Hyperlipidemia   . Hypothyroidism   . CAD (coronary artery disease)     nonobstructive plaque by cath 2013  . Morbid obesity ([place])     seen dietician 09/2011  . T2DM (type 2 diabetes mellitus) ([place])   . Vitamin D deficiency     last check 48 (11/2010)  . Fibula fracture   . Fatty liver 2006    [place]  . 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 Chronic diastolic CHF (congestive heart failure) ([place]) 08/2011    EF 55-60%, nl valves  . RLS (restless legs syndrome)   . Arthritis   . GERD (gastroesophageal reflux disease)   . Hypertension   . Complication of anesthesia     hard time waking up 02 sats 85-89% range   . OSA (obstructive sleep apnea)     medium quattro full face mask CPAP 10cm  . 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 Hypersomnia with sleep apnea, unspecified  01/08/2014  . Osteoarthritis of left knee 2016    nontraumatic loose body with locking, primary localized OA [name])    Past Surgical History  Procedure Laterality Date  . Osteotomy and ulnar shortening  2001    [name]  . Knee surgery Left 11-02-2008    Partial medial and lateral Menisectomies and Chondroplasty (Dr. [name])  . Cardiac catheterization  10/12/09    EF 70%  . Mr abdomen  01/16/2011  Left 4cm adrenal myelolipoma  . Cardiac catheterization  08/23/2011    Mild nonobstructive CAD, normal LV fxn  . US echocardiography  08/2011    EF of 55-60% and indeterminate diastolic function  . Esophagogastroduodenoscopy  2006  . Ventral hernia repair  03/01/2012    Procedure: LAPAROSCOPIC VENTRAL HERNIA;  Surgeon: Ralene Ok, MD;  Location: [facility];  Service: General;  Laterality: N/A;  . Left heart catheterization with coronary angiogram N/A 08/23/2011    Procedure: LEFT HEART CATHETERIZATION WITH CORONARY ANGIOGRAM;  Surgeon: Sherren Mocha, MD;  Location: [facility] CATH LAB;  Service: Cardiovascular;  Laterality: N/A;    Social History  reports that she quit smoking about 18 years ago. She has never used smokeless tobacco. She reports that she does not drink alcohol or use illicit drugs.  Family History family history includes Coronary artery disease (age of onset: 86) in her mother; Diabetes in her mother; Heart attack (age of onset: 37) in her brother; Heart attack (age of onset: 66) in her father; Stroke in her mother. There is no history of Colon cancer, Colon polyps, Esophageal cancer, or Kidney disease.   Review of Systems  Constitutional: Negative.   Respiratory: Positive for shortness of breath.   Gastrointestinal: Negative.   Musculoskeletal: Positive for myalgias and arthralgias.  Skin: Negative.   Neurological: Negative.   Hematological: Negative.   Psychiatric/Behavioral: Negative.   All other systems reviewed and are negative.   BP 120/64 mmHg  Pulse 69  Ht 5' 5.5"  (1.664 m)  Wt 327 lb (148.326 kg)  BMI 53.57 kg/m2  Physical Exam  Constitutional: She is oriented to person, place, and time. She appears well-developed and well-nourished.  Morbidly obese  HENT:  Head: Normocephalic.  Nose: Nose normal.  Mouth/Throat: Oropharynx is clear and moist.  Eyes: Conjunctivae are normal. Pupils are equal, round, and reactive to light.  Neck: Normal range of motion. Neck supple. No JVD present.  Cardiovascular: Normal rate, regular rhythm, S1 normal, S2 normal, normal heart sounds and intact distal pulses.  Exam reveals no gallop and no friction rub.   No murmur heard. Pulmonary/Chest: Effort normal and breath sounds normal. No respiratory distress. She has no wheezes. She has no rales. She exhibits no tenderness.  Abdominal: Soft. Bowel sounds are normal. She exhibits no distension. There is no tenderness.  Musculoskeletal: Normal range of motion. She exhibits no edema or tenderness.  Lymphadenopathy:    She has no cervical adenopathy.  Neurological: She is alert and oriented to person, place, and time. Coordination normal.  Skin: Skin is warm and dry. No rash noted. No erythema.  Psychiatric: She has a normal mood and affect. Her behavior is normal. Judgment and thought content normal.    Assessment and Plan  Nursing note and vitals reviewed.

## 2015-09-17 NOTE — Patient Instructions (Signed)
You are doing well.  For cramping, stop the lipitor for a few weeks If cramping goes away, We would try crestor  Please call us if you have new issues that need to be addressed before your next appt.  Your physician wants you to follow-up in: 12 months.  You will receive a reminder letter in the mail two months in advance. If you don't receive a letter, please call our office to schedule the follow-up appointment.

## 2015-09-17 NOTE — Assessment & Plan Note (Signed)
Continues to take Lasix daily with potassium Recent renal function stable

## 2015-09-17 NOTE — Assessment & Plan Note (Signed)
Compliant with her CPAP 

## 2015-09-17 NOTE — Assessment & Plan Note (Signed)
Long history of rare atypical chest pain at rest, no further workup at this time   Total encounter time more than 25 minutes  Greater than 50% was spent in counseling and coordination of care with the patient

## 2015-09-22 ENCOUNTER — Institutional Professional Consult (permissible substitution): Payer: Medicare Other | Admitting: Internal Medicine

## 2015-09-23 ENCOUNTER — Ambulatory Visit (INDEPENDENT_AMBULATORY_CARE_PROVIDER_SITE_OTHER): Payer: Medicare HMO | Admitting: Internal Medicine

## 2015-09-23 ENCOUNTER — Ambulatory Visit (INDEPENDENT_AMBULATORY_CARE_PROVIDER_SITE_OTHER)
Admission: RE | Admit: 2015-09-23 | Discharge: 2015-09-23 | Disposition: A | Discharge status: Home / Self Care | Payer: Medicare HMO | Source: Ambulatory Visit | Attending: Internal Medicine | Admitting: Internal Medicine

## 2015-09-23 ENCOUNTER — Encounter: Payer: Self-pay | Admitting: Internal Medicine

## 2015-09-23 VITALS — BP 126/72 | HR 64 | Temp 98.0°F | Wt 330.0 lb

## 2015-09-23 DIAGNOSIS — R0789 Other chest pain: Secondary | ICD-10-CM | POA: Insufficient documentation

## 2015-09-23 DIAGNOSIS — R079 Chest pain, unspecified: Secondary | ICD-10-CM

## 2015-09-23 MED ORDER — HYDROCODONE-ACETAMINOPHEN 5-325 MG PO TABS: 1.0000 | ORAL_TABLET | Freq: Four times a day (QID) | ORAL | Status: DC | PRN

## 2015-09-23 NOTE — Assessment & Plan Note (Addendum)
After fall and direct trauma to anterior chest Tender over sternum and left costal cartilage No signs of rib fracture Doubt pericardial or pulmonary involvement but will check CXR This is reassuring (heart size, lungs)---will await overread Probably just costochondritis Continue ice, ibuprofen Small quantity of norco for bedtime

## 2015-09-23 NOTE — Progress Notes (Signed)
Pre visit review using our clinic review tool, if applicable. No additional management support is needed unless otherwise documented below in the visit note. 

## 2015-09-23 NOTE — Progress Notes (Signed)
Subjective:    Patient ID: [name], female    DOB: Oct 05, 1950, 65 y.o.   MRN: [id number]  HPI Here due to fall 5 days ago Tripped at car wash--over mat---hit concrete riser and fell into bench (chest hit that) Saw the bruising and thought it would just get better Ongoing pain--but got worse last night (without provocation) Now she has pain radiating into back with leaning forward Really bad pain getting up or down--and can get SOB Hard to lie back in bed May have pain with deep breath--- right back and flank and the chest  Has been using ibuprofen 800mg  tid and tylenol in between ??helpful  Current Outpatient Prescriptions on File Prior to Visit  Medication Sig Dispense Refill  . ALPRAZolam (XANAX) 0.5 MG tablet Take 0.5 mg by mouth at bedtime.  2  . aspirin EC 81 MG tablet Take 81 mg by mouth daily.    Marland Kitchen atorvastatin (LIPITOR) 40 MG tablet TAKE 1 TABLET (40 MG TOTAL) BY MOUTH AT BEDTIME. (Patient taking differently: Take 40 mg by mouth at bedtime. ) 90 tablet 2  . cyanocobalamin (,VITAMIN B-12,) 1000 MCG/ML injection Inject 1 mL (1,000 mcg total) into the muscle every 30 (thirty) days. 10 mL 3  . furosemide (LASIX) 40 MG tablet TAKE 1 TABLET BY MOUTH EVERY DAY 30 tablet 0  . gabapentin (NEURONTIN) 400 MG capsule Take 400-1,200 mg by mouth 3 (three) times daily. Take one capsule in the morning, one capsule 7 hours later and then three capsules at bedtime.  0  . ibuprofen (ADVIL,MOTRIN) 200 MG tablet Take 600 mg by mouth every 6 (six) hours as needed (For pain.).    Marland Kitchen lamoTRIgine (LAMICTAL) 200 MG tablet Take 200 mg by mouth daily.    Marland Kitchen levothyroxine (SYNTHROID, LEVOTHROID) 112 MCG tablet TAKE 2 TABLETS (224 MCG TOTAL) BY MOUTH DAILY BEFORE BREAKFAST (Patient taking differently: Take 224 mcg by mouth daily before breakfast. ) 60 tablet 6  . metFORMIN (GLUCOPHAGE) 850 MG tablet Take 1 tablet (850 mg total) by mouth 2 (two) times daily with a meal. 180 tablet 1  . naproxen  (NAPROSYN) 500 MG tablet Take 500 mg by mouth daily as needed (For pain.).    Marland Kitchen nystatin ointment (MYCOSTATIN) Apply 1 application topically daily as needed. Apply to red areas in groin 30 g 1  . omeprazole (PRILOSEC) 40 MG capsule TAKE 1 CAPSULE (40 MG TOTAL) BY MOUTH DAILY. (Patient taking differently: Take 40 mg by mouth daily. ) 30 capsule 6  . potassium chloride (KLOR-CON 10) 10 MEQ tablet TAKE 2 TABLETS (20 MEQ TOTAL) BY MOUTH DAILY. (Patient taking differently: Take 20 mEq by mouth daily. ) 180 tablet 3  . ramipril (ALTACE) 10 MG capsule TAKE 1 CAPSULE (10 MG TOTAL) BY MOUTH DAILY. 90 capsule 1  . rOPINIRole (REQUIP) 3 MG tablet Take 1 tablet (3 mg total) by mouth at bedtime. 90 tablet 1  . triamcinolone cream (KENALOG) 0.1 % APPLY TO AFFECTED AREA ON EXTERNAL EAR TWICE DAILY. DO NOT USE FOR MORE THAN 2 WEEKS AT A TIME (Patient taking differently: APPLY TO AFFECTED AREA DAILY AS NEEDED FOR YEAST INFECTION.) 30 g 0  . Vilazodone HCl (VIIBRYD) 40 MG TABS Take 40 mg by mouth every morning.     Current Facility-Administered Medications on File Prior to Visit  Medication Dose Route Frequency Provider Last Rate Last Dose  . triamcinolone acetonide (KENALOG) 10 MG/ML injection 10 mg  10 mg Other Once Landis Martins, DPM  Allergies  Allergen Reactions  . Augmentin [Amoxicillin-Pot Clavulanate] Diarrhea and Other (See Comments)    Watery diarrhea and abd pain Has patient had a PCN reaction causing immediate rash, facial/tongue/throat swelling, SOB or lightheadedness with hypotension: no Has patient had a PCN reaction causing severe rash involving mucus membranes or skin necrosis: no Has patient had a PCN reaction that required hospitalization no Has patient had a PCN reaction occurring within the last 10 years: yes If all of the above answers are "NO", then may proceed with Cephal  . Erythromycin Base Nausea And Vomiting  . Neomycin Nausea And Vomiting    Past Medical History    Diagnosis Date  . Vitamin B 12 deficiency     IF negative  . Anxiety   . Depression   . Hyperlipidemia   . Hypothyroidism   . CAD (coronary artery disease)     nonobstructive plaque by cath 2013  . Morbid obesity ([place])     seen dietician 09/2011  . T2DM (type 2 diabetes mellitus) ([place])   . Vitamin D deficiency     last check 48 (11/2010)  . Fibula fracture   . Fatty liver 2006    [place]  . 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 Chronic diastolic CHF (congestive heart failure) ([place]) 08/2011    EF 55-60%, nl valves  . RLS (restless legs syndrome)   . Arthritis   . GERD (gastroesophageal reflux disease)   . Hypertension   . Complication of anesthesia     hard time waking up 02 sats 85-89% range   . OSA (obstructive sleep apnea)     medium quattro full face mask CPAP 10cm  . 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 Hypersomnia with sleep apnea, unspecified 01/08/2014  . Osteoarthritis of left knee 2016    nontraumatic loose body with locking, primary localized OA [name])    Past Surgical History  Procedure Laterality Date  . Osteotomy and ulnar shortening  2001    [name]  . Knee surgery Left 11-02-2008    Partial medial and lateral Menisectomies and Chondroplasty (Dr. [name])  . Cardiac catheterization  10/12/09    EF 70%  . Mr abdomen  01/16/2011    Left 4cm adrenal myelolipoma  . Cardiac catheterization  08/23/2011    Mild nonobstructive CAD, normal LV fxn  . US echocardiography  08/2011    EF of 55-60% and indeterminate diastolic function  . Esophagogastroduodenoscopy  2006  . Ventral hernia repair  03/01/2012    Procedure: LAPAROSCOPIC VENTRAL HERNIA;  Surgeon: Ralene Ok, MD;  Location: [facility];  Service: General;  Laterality: N/A;  . Left heart catheterization with coronary angiogram N/A 08/23/2011    Procedure: LEFT HEART CATHETERIZATION WITH CORONARY ANGIOGRAM;  Surgeon: Sherren Mocha, MD;  Location: [facility] CATH LAB;  Service: Cardiovascular;   Laterality: N/A;    Family History  Problem Relation Age of Onset  . Coronary artery disease Mother 62    14 angioplasty and 5v CABG  . Stroke Mother   . Diabetes Mother   . Heart attack Father 42  . Heart attack Brother 81    deceased age 37  . Colon cancer Neg Hx   . Colon polyps Neg Hx   . Esophageal cancer Neg Hx   . Kidney disease Neg Hx     Social History   Social History  . Marital Status: Divorced    Spouse Name: N/A  .  Number of Children: 1  . Years of Education: 13   Occupational History  . Retired   .     Social History Main Topics  . Smoking status: Former Smoker    Quit date: 08/08/1997  . Smokeless tobacco: Never Used  . Alcohol Use: No  . Drug Use: No  . Sexual Activity: No   Other Topics Concern  . Not on file   Social History Narrative   Patient is divorced and lives with her daughter.   Patient has one adult child.   Patient is retired.   Patient has one year of college.   Patient is right-handed.   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   Diet: watching diet, low sodium. Good water.  Fruits/vegetables daily.      Adenosine Myoview nml 08/20/07   HOSP CP R/O'd Nonobstruct CAD by Cath EF 60% 5/27-10/13/2009   CATH and ECHO Nonobstr CAD  EF 60% 10/12/2009         Review of Systems No palpitations No dizziness or syncope Some nausea --relates to the pain    Objective:   Physical Exam  Constitutional: No distress.  Dyspneic just getting on table--very difficult. RR up to 24-26, then settled back to 18  Neck: No thyromegaly present.  Cardiovascular: Normal rate, regular rhythm and normal heart sounds.  Exam reveals no gallop and no friction rub.   No murmur heard. Pulmonary/Chest: Effort normal and breath sounds normal. No respiratory distress. She has no wheezes. She has no rales.  Very tender over upper sternum and left costal cartilage No specific rib  tenderness No pain over lower sternum or xiphoid  Lymphadenopathy:    She has no cervical adenopathy.  Skin:  Sig bruising along top of right breast Small abrasion as well          Assessment & Plan:

## 2015-09-24 ENCOUNTER — Telehealth: Payer: Self-pay | Admitting: Internal Medicine

## 2015-09-24 NOTE — Telephone Encounter (Signed)
Patient fell and hit her sternum on an a bench and will not be able to have her procedure next week.  She wants to rescheduled for July.  I advised her that there are no hospital openings that month.  She will call me in mid June to set up for August.

## 2015-10-01 ENCOUNTER — Ambulatory Visit (INDEPENDENT_AMBULATORY_CARE_PROVIDER_SITE_OTHER): Payer: Medicare HMO | Admitting: Family Medicine

## 2015-10-01 ENCOUNTER — Telehealth: Payer: Self-pay | Admitting: Family Medicine

## 2015-10-01 ENCOUNTER — Ambulatory Visit (INDEPENDENT_AMBULATORY_CARE_PROVIDER_SITE_OTHER)
Admission: RE | Admit: 2015-10-01 | Discharge: 2015-10-01 | Disposition: A | Discharge status: Home / Self Care | Payer: Medicare HMO | Source: Ambulatory Visit | Attending: Family Medicine | Admitting: Family Medicine

## 2015-10-01 ENCOUNTER — Encounter: Payer: Self-pay | Admitting: Family Medicine

## 2015-10-01 VITALS — BP 134/80 | HR 87 | Temp 98.1°F | Wt 330.0 lb

## 2015-10-01 DIAGNOSIS — R071 Chest pain on breathing: Secondary | ICD-10-CM

## 2015-10-01 DIAGNOSIS — S299XXA Unspecified injury of thorax, initial encounter: Secondary | ICD-10-CM | POA: Diagnosis not present

## 2015-10-01 DIAGNOSIS — R0789 Other chest pain: Secondary | ICD-10-CM

## 2015-10-01 DIAGNOSIS — R072 Precordial pain: Secondary | ICD-10-CM | POA: Diagnosis not present

## 2015-10-01 MED ORDER — HYDROCODONE-ACETAMINOPHEN 5-325 MG PO TABS: 1.0000 | ORAL_TABLET | Freq: Three times a day (TID) | ORAL | Status: DC | PRN

## 2015-10-01 NOTE — Progress Notes (Signed)
BP 134/80 mmHg  Pulse 87  Temp(Src) 98.1 F (36.7 C) (Oral)  Wt 330 lb (149.687 kg)  SpO2 92%   CC: fall  Subjective:    Patient ID: [name], female    DOB: May 29, 1950, 65 y.o.   MRN: [id number]  HPI: REBBECA CROCCO is a 65 y.o. female presenting on 10/01/2015 for Follow-up   See prior note for details. Seen by Dr Silvio Pate 09/13/2015 - tripped at car wash and fell onto bench, hit R anterior chest wall. Dx with costochondritis, rec ibuprofen regularly, norco for bedtime.   Initially after fall (Sunday May 7th) no pain, then 3d later severe pain - worse with position changes and with getting out of chair. Some better, but still severe pain with transfers from chair to upright, as well as deep breath or cough/sneeze.   Relevant past medical, surgical, family and social history reviewed and updated as indicated. Interim medical history since our last visit reviewed. Allergies and medications reviewed and updated. Current Outpatient Prescriptions on File Prior to Visit  Medication Sig  . ALPRAZolam (XANAX) 0.5 MG tablet Take 0.5 mg by mouth at bedtime.  Marland Kitchen aspirin EC 81 MG tablet Take 81 mg by mouth daily.  Marland Kitchen atorvastatin (LIPITOR) 40 MG tablet TAKE 1 TABLET (40 MG TOTAL) BY MOUTH AT BEDTIME. (Patient taking differently: Take 40 mg by mouth at bedtime. )  . cyanocobalamin (,VITAMIN B-12,) 1000 MCG/ML injection Inject 1 mL (1,000 mcg total) into the muscle every 30 (thirty) days.  . furosemide (LASIX) 40 MG tablet TAKE 1 TABLET BY MOUTH EVERY DAY  . gabapentin (NEURONTIN) 400 MG capsule Take 400-1,200 mg by mouth 3 (three) times daily. Take one capsule in the morning, one capsule 7 hours later and then three capsules at bedtime.  Marland Kitchen ibuprofen (ADVIL,MOTRIN) 200 MG tablet Take 600 mg by mouth every 6 (six) hours as needed (For pain.).  Marland Kitchen lamoTRIgine (LAMICTAL) 200 MG tablet Take 200 mg by mouth daily.  Marland Kitchen levothyroxine (SYNTHROID, LEVOTHROID) 112 MCG tablet TAKE 2 TABLETS (224 MCG  TOTAL) BY MOUTH DAILY BEFORE BREAKFAST (Patient taking differently: Take 224 mcg by mouth daily before breakfast. )  . metFORMIN (GLUCOPHAGE) 850 MG tablet Take 1 tablet (850 mg total) by mouth 2 (two) times daily with a meal.  . naproxen (NAPROSYN) 500 MG tablet Take 500 mg by mouth daily as needed (For pain.).  Marland Kitchen nystatin ointment (MYCOSTATIN) Apply 1 application topically daily as needed. Apply to red areas in groin  . omeprazole (PRILOSEC) 40 MG capsule TAKE 1 CAPSULE (40 MG TOTAL) BY MOUTH DAILY. (Patient taking differently: Take 40 mg by mouth daily. )  . potassium chloride (KLOR-CON 10) 10 MEQ tablet TAKE 2 TABLETS (20 MEQ TOTAL) BY MOUTH DAILY. (Patient taking differently: Take 20 mEq by mouth daily. )  . ramipril (ALTACE) 10 MG capsule TAKE 1 CAPSULE (10 MG TOTAL) BY MOUTH DAILY.  Marland Kitchen rOPINIRole (REQUIP) 3 MG tablet Take 1 tablet (3 mg total) by mouth at bedtime.  . triamcinolone cream (KENALOG) 0.1 % APPLY TO AFFECTED AREA ON EXTERNAL EAR TWICE DAILY. DO NOT USE FOR MORE THAN 2 WEEKS AT A TIME (Patient taking differently: APPLY TO AFFECTED AREA DAILY AS NEEDED FOR YEAST INFECTION.)  . Vilazodone HCl (VIIBRYD) 40 MG TABS Take 40 mg by mouth every morning.   Current Facility-Administered Medications on File Prior to Visit  Medication  . triamcinolone acetonide (KENALOG) 10 MG/ML injection 10 mg    Review of Systems Per  HPI unless specifically indicated in ROS section     Objective:    BP 134/80 mmHg  Pulse 87  Temp(Src) 98.1 F (36.7 C) (Oral)  Wt 330 lb (149.687 kg)  SpO2 92%  Wt Readings from Last 3 Encounters:  10/01/15 330 lb (149.687 kg)  09/23/15 330 lb (149.687 kg)  09/17/15 327 lb (148.326 kg)    Physical Exam  Constitutional: She appears well-developed and well-nourished. No distress.  Cardiovascular: Normal rate, regular rhythm, normal heart sounds and intact distal pulses.   No murmur heard. Pulmonary/Chest: Effort normal and breath sounds normal. No respiratory  distress. She has no wheezes. She has no rales. She exhibits tenderness.  Point tenderness mid sternum as well as pain localizing to 2nd R costochondral joint  Musculoskeletal: She exhibits no edema.  Skin:  Mild bruising R breast without hematoma Healing abrasion R anterior chest wall  Nursing note and vitals reviewed.     Assessment & Plan:   Problem List Items Addressed This Visit    Anterior chest wall pain - Primary    Point tender over mid sternum and R 2nd costochondral joint. Suspicion for sternal fracture. Check sternal xray. Discussed anticipated prolonged recovery over next 2 months. Discussed continued treatment with NSAID and refilled norco PRN breakthrough pain. Pt agrees with plan.       Relevant Orders   DG Sternum (Completed)       Follow up plan: Return if symptoms worsen or fail to improve.  Ria Bush, MD

## 2015-10-01 NOTE — Patient Instructions (Addendum)
Continue ice/heating pad. Ok to continue ibuprofen. Sternal xray today. Refilled norco.  This will take 6-8 wks to fully heal

## 2015-10-01 NOTE — Progress Notes (Signed)
Pre visit review using our clinic review tool, if applicable. No additional management support is needed unless otherwise documented below in the visit note. 

## 2015-10-01 NOTE — Telephone Encounter (Signed)
Pt has appt with Dr Darnell Level 10/01/15 at 2:45.

## 2015-10-01 NOTE — Telephone Encounter (Signed)
Patient Name: [name] DOB: Oct 24, 1950 Initial Comment Caller states saw Dr Silvio Pate last week for bad fall, gave her pain meds, they have ran out, needing refill Nurse Assessment Nurse: Ronnald Ramp, RN, Miranda Date/Time (Eastern Time): 10/01/2015 9:06:50 AM Confirm and document reason for call. If symptomatic, describe symptoms. You must click the next button to save text entered. ---Caller states she feel 8 days ago and hit her right breast/chest area. She has been taking Norco 3 times a day and she is out. She has also been rotating Tylenol and Ibuprofen. Last dose of Advil was 1 hr ago. Last dose of Norco at 8pm last night. Has the patient traveled out of the country within the last 30 days? ---Not Applicable Does the patient have any new or worsening symptoms? ---Yes Will a triage be completed? ---Yes Related visit to physician within the last 2 weeks? ---Yes Does the PT have any chronic conditions? (i.e. diabetes, asthma, etc.) ---Yes List chronic conditions. ---Diabetes, High Cholesterol, HTN Is this a behavioral health or substance abuse call? ---No Guidelines Guideline Title Affirmed Question Affirmed Notes Chest Injury [1] High-risk adult (e.g., age > 55, osteoporosis, chronic steroid use) AND [2] still hurts Final Disposition User See Physician within 24 Hours Ronnald Ramp, RN, Miranda Comments Appt scheduled for Dr. Danise Mina at 2:45p Referrals REFERRED TO PCP OFFICE Disagree/Comply: Comply

## 2015-10-02 NOTE — Assessment & Plan Note (Signed)
Point tender over mid sternum and R 2nd costochondral joint. Suspicion for sternal fracture. Check sternal xray. Discussed anticipated prolonged recovery over next 2 months. Discussed continued treatment with NSAID and refilled norco PRN breakthrough pain. Pt agrees with plan.

## 2015-10-05 ENCOUNTER — Ambulatory Visit (HOSPITAL_COMMUNITY): Admission: RE | Admit: 2015-10-05 | Payer: Medicare HMO | Source: Ambulatory Visit | Admitting: Internal Medicine

## 2015-10-05 ENCOUNTER — Encounter (HOSPITAL_COMMUNITY): Admission: RE | Payer: Self-pay | Source: Ambulatory Visit

## 2015-10-05 SURGERY — COLONOSCOPY WITH PROPOFOL
Anesthesia: Monitor Anesthesia Care

## 2015-10-12 ENCOUNTER — Other Ambulatory Visit: Payer: Self-pay

## 2015-10-12 NOTE — Telephone Encounter (Signed)
Pt left v/m requesting rx hydrocodone apap due to fx sternum; still hurts when takes breath or moves. Call when ready for pick up. Pt last seen and rx printed # 30 on 10/01/15.

## 2015-10-13 ENCOUNTER — Encounter: Payer: Self-pay | Admitting: Family Medicine

## 2015-10-13 MED ORDER — HYDROCODONE-ACETAMINOPHEN 5-325 MG PO TABS: 1.0000 | ORAL_TABLET | Freq: Two times a day (BID) | ORAL | Status: DC | PRN

## 2015-10-13 NOTE — Telephone Encounter (Signed)
Please see Mychart message.

## 2015-10-13 NOTE — Telephone Encounter (Signed)
Patient notified and verbalized understanding. Rx placed up front for pick up.  

## 2015-10-13 NOTE — Telephone Encounter (Signed)
Printed and in Kim's box. rec start with tylenol 500mg  TID scheduled for next 1 week. Use hydrocodone for breakthrough pain.

## 2015-10-17 ENCOUNTER — Other Ambulatory Visit: Payer: Self-pay | Admitting: Cardiovascular Disease

## 2015-10-27 ENCOUNTER — Other Ambulatory Visit: Payer: Medicare HMO

## 2015-10-27 ENCOUNTER — Other Ambulatory Visit: Payer: Self-pay | Admitting: Family Medicine

## 2015-10-27 DIAGNOSIS — E538 Deficiency of other specified B group vitamins: Secondary | ICD-10-CM

## 2015-10-27 DIAGNOSIS — E1165 Type 2 diabetes mellitus with hyperglycemia: Principal | ICD-10-CM

## 2015-10-27 DIAGNOSIS — IMO0002 Reserved for concepts with insufficient information to code with codable children: Secondary | ICD-10-CM

## 2015-10-27 DIAGNOSIS — E785 Hyperlipidemia, unspecified: Secondary | ICD-10-CM

## 2015-10-27 DIAGNOSIS — E1140 Type 2 diabetes mellitus with diabetic neuropathy, unspecified: Secondary | ICD-10-CM

## 2015-10-28 ENCOUNTER — Institutional Professional Consult (permissible substitution): Payer: Medicare HMO | Admitting: Pulmonary Disease

## 2015-11-03 ENCOUNTER — Ambulatory Visit: Payer: Medicare HMO | Admitting: Family Medicine

## 2015-11-08 ENCOUNTER — Encounter: Payer: Self-pay | Admitting: Family Medicine

## 2015-11-08 MED ORDER — FLUCONAZOLE 150 MG PO TABS: 150.0000 mg | ORAL_TABLET | ORAL | Status: DC

## 2015-11-08 NOTE — Telephone Encounter (Signed)
Please see Mychart message.

## 2015-11-11 ENCOUNTER — Institutional Professional Consult (permissible substitution): Payer: Medicare HMO | Admitting: Internal Medicine

## 2015-11-14 ENCOUNTER — Other Ambulatory Visit: Payer: Self-pay | Admitting: Sports Medicine

## 2015-11-14 ENCOUNTER — Other Ambulatory Visit: Payer: Self-pay | Admitting: Family Medicine

## 2015-11-22 ENCOUNTER — Ambulatory Visit: Payer: Medicare HMO | Admitting: Family Medicine

## 2015-11-25 ENCOUNTER — Telehealth: Payer: Self-pay | Admitting: Internal Medicine

## 2015-11-25 NOTE — Telephone Encounter (Signed)
Patient will come in tomorrow for evaluation of her diarrhea.  Appt will be arranged in the office tomorrow

## 2015-11-25 NOTE — Telephone Encounter (Signed)
Patient will come in tomorrow at 10:00 and see Dr. Carlean Purl

## 2015-11-26 ENCOUNTER — Encounter: Payer: Self-pay | Admitting: Internal Medicine

## 2015-11-26 ENCOUNTER — Ambulatory Visit (INDEPENDENT_AMBULATORY_CARE_PROVIDER_SITE_OTHER): Payer: Medicare HMO | Admitting: Internal Medicine

## 2015-11-26 VITALS — BP 106/60 | HR 76 | Ht 65.5 in | Wt 328.0 lb

## 2015-11-26 DIAGNOSIS — R197 Diarrhea, unspecified: Secondary | ICD-10-CM

## 2015-11-26 DIAGNOSIS — R159 Full incontinence of feces: Secondary | ICD-10-CM | POA: Diagnosis not present

## 2015-11-26 MED ORDER — SOD PICOSULFATE-MAG OX-CIT ACD 10-3.5-12 MG-GM-GM PO PACK: 1.0000 | PACK | Freq: Once | ORAL | Status: DC

## 2015-11-26 MED ORDER — ONDANSETRON HCL 4 MG PO TABS: 4.0000 mg | ORAL_TABLET | Freq: Three times a day (TID) | ORAL | Status: DC

## 2015-11-26 NOTE — Patient Instructions (Signed)
  You have been scheduled for a colonoscopy. Please follow written instructions given to you at your visit today.  Please pick up your prep supplies at the pharmacy within the next 1-3 days. If you use inhalers (even only as needed), please bring them with you on the day of your procedure.  We have sent the following medications to your pharmacy for you to pick up at your convenience: Generic zofran, call us back if this doesn't help.   I appreciate the opportunity to care for you. Silvano Rusk, MD, [facility]

## 2015-11-26 NOTE — Progress Notes (Signed)
Subjective:    Patient ID: [name], female    DOB: [birth year], 65 y.o.   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 Augmentin [Amoxicillin-Pot Clavulanate] Diarrhea and Other (See Comments)    Watery diarrhea and abd pain Has patient had a PCN reaction causing immediate rash, facial/tongue/throat swelling, SOB or lightheadedness with hypotension: no Has patient had a PCN reaction causing severe rash involving mucus membranes or skin necrosis: no Has patient had a PCN reaction that required hospitalization no Has patient had a PCN reaction occurring within the last 10 years: yes If all of the above answers are "NO", then may proceed with Cephal  . Erythromycin Base Nausea And Vomiting  . Neomycin Nausea And Vomiting   Outpatient Prescriptions Prior to Visit  Medication Sig Dispense Refill  . ALPRAZolam (XANAX) 0.5 MG tablet Take 0.5 mg by mouth at bedtime.  2  . aspirin EC 81 MG tablet Take 81 mg by mouth daily.    Marland Kitchen atorvastatin (LIPITOR) 40 MG tablet TAKE 1 TABLET (40 MG TOTAL) BY MOUTH AT BEDTIME. (Patient taking differently: Take 40 mg by mouth at bedtime. ) 90 tablet 2  . cyanocobalamin (,VITAMIN B-12,) 1000 MCG/ML injection Inject 1 mL (1,000 mcg total) into the muscle every 30 (thirty) days. 10 mL 3  . fluconazole (DIFLUCAN) 150 MG tablet Take 1 tablet (150 mg total) by mouth once a week. 4 tablet 0  . furosemide (LASIX) 40 MG tablet TAKE 1 TABLET BY MOUTH EVERY DAY 30 tablet 6  . gabapentin (NEURONTIN) 400 MG capsule Take 400-1,200 mg by mouth 3 (three) times daily. Take one capsule in the morning, one capsule 7 hours later and then three capsules at  bedtime.  0  . ibuprofen (ADVIL,MOTRIN) 200 MG tablet Take 600 mg by mouth every 6 (six) hours as needed (For pain.).    Marland Kitchen lamoTRIgine (LAMICTAL) 200 MG tablet Take 200 mg by mouth daily.    Marland Kitchen levothyroxine (SYNTHROID, LEVOTHROID) 112 MCG tablet TAKE 2 TABLETS (224 MCG TOTAL) BY MOUTH DAILY BEFORE BREAKFAST (Patient taking differently: Take 224 mcg by mouth daily before breakfast. ) 60 tablet 6  . metFORMIN (GLUCOPHAGE) 850 MG tablet Take 1 tablet (850 mg total) by mouth 2 (two) times daily with a meal. 180 tablet 1  . nystatin ointment (MYCOSTATIN) Apply 1 application topically daily as needed. Apply to red areas in groin 30 g 1  . omeprazole (PRILOSEC) 40 MG capsule TAKE 1 CAPSULE (40 MG TOTAL) BY MOUTH DAILY. (Patient taking differently: Take 40 mg by mouth daily. ) 30 capsule 6  . potassium chloride (KLOR-CON 10) 10 MEQ tablet TAKE 2 TABLETS (20 MEQ TOTAL) BY MOUTH DAILY. (Patient taking differently: Take 20 mEq by mouth daily. ) 180 tablet 3  . ramipril (ALTACE) 10 MG capsule TAKE 1 CAPSULE (10 MG TOTAL) BY MOUTH DAILY. 90 capsule 1  . rOPINIRole (REQUIP) 3 MG tablet TAKE 1 TABLET(3 MG) BY MOUTH AT BEDTIME 90 tablet 0  . triamcinolone cream (KENALOG) 0.1 % APPLY TO AFFECTED AREA ON EXTERNAL EAR TWICE DAILY. DO NOT USE FOR MORE THAN 2 WEEKS AT A TIME (  Patient taking differently: APPLY TO AFFECTED AREA DAILY AS NEEDED FOR YEAST INFECTION.) 30 g 0  . Vilazodone HCl (VIIBRYD) 40 MG TABS Take 40 mg by mouth every morning.    . diclofenac (VOLTAREN) 75 MG EC tablet TAKE 1 TABLET(75 MG) BY MOUTH TWICE DAILY 30 tablet 0  . HYDROcodone-acetaminophen (NORCO/VICODIN) 5-325 MG tablet Take 1 tablet by mouth 2 (two) times daily as needed for moderate pain. 30 tablet 0  . naproxen (NAPROSYN) 500 MG tablet Take 500 mg by mouth daily as needed (For pain.).     Facility-Administered Medications Prior to Visit  Medication Dose Route Frequency Provider Last Rate Last Dose  . triamcinolone acetonide (KENALOG) 10  MG/ML injection 10 mg  10 mg Other Once Landis Martins, DPM       Past Medical History  Diagnosis Date  . Vitamin B 12 deficiency     IF negative  . Anxiety   . Depression   . Hyperlipidemia   . Hypothyroidism   . CAD (coronary artery disease)     nonobstructive plaque by cath 2013  . Morbid obesity ([place])     seen dietician 09/2011  . T2DM (type 2 diabetes mellitus) ([place])   . Vitamin D deficiency     last check 47 (11/2010)  . Fibula fracture   . Fatty liver 2006    [place]  . 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 Chronic diastolic CHF (congestive heart failure) ([place]) 08/2011    EF 55-60%, nl valves  . RLS (restless legs syndrome)   . Arthritis   . GERD (gastroesophageal reflux disease)   . Hypertension   . Complication of anesthesia     hard time waking up 02 sats 85-89% range   . OSA (obstructive sleep apnea)     medium quattro full face mask CPAP 10cm  . 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 Hypersomnia with sleep apnea, unspecified 01/08/2014  . Osteoarthritis of left knee 2016    nontraumatic loose body with locking, primary localized OA [name])   Past Surgical History  Procedure Laterality Date  . Osteotomy and ulnar shortening  2001    [name]  . Knee surgery Left 11-02-2008    Partial medial and lateral Menisectomies and Chondroplasty (Dr. [name])  . Cardiac catheterization  10/12/09    EF 70%  . Mr abdomen  01/16/2011    Left 4cm adrenal myelolipoma  . Cardiac catheterization  08/23/2011    Mild nonobstructive CAD, normal LV fxn  . US echocardiography  08/2011    EF of 55-60% and indeterminate diastolic function  . Esophagogastroduodenoscopy  2006  . Ventral hernia repair  03/01/2012    Procedure: LAPAROSCOPIC VENTRAL HERNIA;  Surgeon: Ralene Ok, MD;  Location: [facility]S;  Service: General;  Laterality: N/A;  . Left heart catheterization with coronary angiogram N/A 08/23/2011    Procedure: LEFT HEART CATHETERIZATION WITH CORONARY  ANGIOGRAM;  Surgeon: Sherren Mocha, MD;  Location: [facility] CATH LAB;  Service: Cardiovascular;  Laterality: N/A;   Social History   Social History  . Marital Status: Divorced    Spouse Name: N/A  . Number of Children: 1  . Years of Education: 13   Occupational History  . Retired   .     Social History Main Topics  . Smoking status: Former Smoker    Quit date: 08/08/1997  . Smokeless tobacco: Never Used  . Alcohol Use: No  . Drug Use:  No  . Sexual Activity: No   Other Topics Concern  . None   Social History Narrative   Patient is divorced and lives with her daughter.   Patient has one adult child.   Patient is retired.   Patient has one year of college.   Patient is right-handed.   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   Diet: watching diet, low sodium. Good water.  Fruits/vegetables daily.      Adenosine Myoview nml 08/20/07   HOSP CP R/O'd Nonobstruct CAD by Cath EF 60% 5/27-10/13/2009   CATH and ECHO Nonobstr CAD  EF 60% 10/12/2009         Family History  Problem Relation Age of Onset  . Coronary artery disease Mother 20    14 angioplasty and 5v CABG  . Stroke Mother   . Diabetes Mother   . Heart attack Father 106  . Heart attack Brother 60    deceased age 41  . Colon cancer Neg Hx   . Colon polyps Neg Hx   . Esophageal cancer Neg Hx   . Kidney disease Neg Hx    Review of Systems As above - recovered from fall to chest      Objective:   Physical Exam BP 106/60 mmHg  Pulse 76  Ht 5' 5.5" (1.664 m)  Wt 328 lb (148.78 kg)  BMI 53.73 kg/m2 NAD Lungs cta abd obese nt Cor RRR s1s2 Appropriate mood and affect     Assessment & Plan:   Encounter Diagnoses  Name Primary?  . Diarrhea, unspecified type Yes  . Fecal incontinence    Colonoscopy at hospital (BMI > 50) The risks and benefits as well as alternatives of endoscopic procedure(s) have been discussed and reviewed. All questions  answered. The patient agrees to proceed.  Prep o pik prep  Ondansetron prn ac  I appreciate the opportunity to care for this patient. [id number] Danise Mina, MD

## 2015-12-03 ENCOUNTER — Ambulatory Visit (INDEPENDENT_AMBULATORY_CARE_PROVIDER_SITE_OTHER): Payer: Medicare HMO | Admitting: Internal Medicine

## 2015-12-03 ENCOUNTER — Encounter: Payer: Self-pay | Admitting: Internal Medicine

## 2015-12-03 VITALS — BP 130/72 | HR 84 | Ht 65.5 in | Wt 322.0 lb

## 2015-12-03 DIAGNOSIS — G4719 Other hypersomnia: Secondary | ICD-10-CM

## 2015-12-03 NOTE — Progress Notes (Signed)
[facility] Consultation      Date: 12/03/2015,   MRN# [id number] [name] 1951/04/29 Code Status:  Code Status History    Date Active Date Inactive Code Status Order ID Comments User Context   03/01/2012  1:06 PM 03/02/2012  8:00 PM Full Code [id number]  Salli Quarry, RN Inpatient   08/20/2011  2:50 AM 08/24/2011  4:38 PM Full Code [id number]  Purvis Kilts, RN Inpatient     Hosp day:@LENGTHOFSTAYDAYS @ Referring MD: @ATDPROV @     PCP:      AdmissionWeight: (!) 322 lb (146.058 kg)                 CurrentWeight: (!) 322 lb (146.058 kg) [name] is a 65 y.o. old female seen in consultation for sleep apnea at the request of Patient.     CHIEF COMPLAINT:   Sleep problems with fatigue and tiredness   HISTORY OF PRESENT ILLNESS   65 yo white female with dx of sleep apnea 5 years ago Patient states that she has been more fatigued and tired over last several months, wants to know if machine and setting are up to date Patient uses nasal pillows, states hat she needs supplies She has gained 50 pounds in 2 years She has h/o CHF and sees dr Rockey Situ She is former smoker, quit 1999 2.5 PPD for 10 years No signs of infection at this time    PAST MEDICAL HISTORY   Past Medical History  Diagnosis Date  . Vitamin B 12 deficiency     IF negative  . Anxiety   . Depression   . Hyperlipidemia   . Hypothyroidism   . CAD (coronary artery disease)     nonobstructive plaque by cath 2013  . Morbid obesity ([place])     seen dietician 09/2011  . T2DM (type 2 diabetes mellitus) ([place])   . Vitamin D deficiency     last check 47 (11/2010)  . Fibula fracture   . Fatty liver 2006    [place]  . 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 Chronic diastolic CHF (congestive heart failure) ([place]) 08/2011    EF 55-60%, nl valves  . RLS (restless legs syndrome)   . Arthritis   . GERD (gastroesophageal reflux disease)   . Hypertension   . Complication of  anesthesia     hard time waking up 02 sats 85-89% range   . OSA (obstructive sleep apnea)     medium quattro full face mask CPAP 10cm  . 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 Hypersomnia with sleep apnea, unspecified 01/08/2014  . Osteoarthritis of left knee 2016    nontraumatic loose body with locking, primary localized OA [name])     SURGICAL HISTORY   Past Surgical History  Procedure Laterality Date  . Osteotomy and ulnar shortening  2001    [name]  . Knee surgery Left 11-02-2008    Partial medial and lateral Menisectomies and Chondroplasty (Dr. [name])  . Cardiac catheterization  10/12/09    EF 70%  . Mr abdomen  01/16/2011    Left 4cm adrenal myelolipoma  . Cardiac catheterization  08/23/2011    Mild nonobstructive CAD, normal LV fxn  . US echocardiography  08/2011    EF of 55-60% and indeterminate diastolic function  . Esophagogastroduodenoscopy  2006  . Ventral hernia repair  03/01/2012    Procedure: LAPAROSCOPIC VENTRAL HERNIA;  Surgeon: Anne Hahn  Rosendo Gros, MD;  Location: [facility];  Service: General;  Laterality: N/A;  . Left heart catheterization with coronary angiogram N/A 08/23/2011    Procedure: LEFT HEART CATHETERIZATION WITH CORONARY ANGIOGRAM;  Surgeon: Sherren Mocha, MD;  [facility] C[facility] LAB;  Service: Cardiovascular;  Laterality: N/A;     FAMILY HISTORY   Family History  Problem Relation Age of Onset  . Coronary artery disease Mother 70    14 angioplasty and 5v CABG  . Stroke Mother   . Diabetes Mother   . Heart attack Father 42  . Heart attack Brother 13    deceased age 14  . Colon cancer Neg Hx   . Colon polyps Neg Hx   . Esophageal cancer Neg Hx   . Kidney disease Neg Hx      SOCIAL HISTORY   Social History  Substance Use Topics  . Smoking status: Former Smoker    Quit date: 08/08/1997  . Smokeless tobacco: Never Used  . Alcohol Use: No     MEDICATIONS    Home Medication:  Current Outpatient Rx  Name  Route  Sig   Dispense  Refill  . ALPRAZolam (XANAX) 0.5 MG tablet   Oral   Take 0.5 mg by mouth at bedtime.      2   . aspirin EC 81 MG tablet   Oral   Take 81 mg by mouth daily.         Marland Kitchen atorvastatin (LIPITOR) 40 MG tablet      TAKE 1 TABLET (40 MG TOTAL) BY MOUTH AT BEDTIME. Patient taking differently: Take 40 mg by mouth at bedtime.    90 tablet   2   . cyanocobalamin (,VITAMIN B-12,) 1000 MCG/ML injection   Intramuscular   Inject 1 mL (1,000 mcg total) into the muscle every 30 (thirty) days.   10 mL   3   . fluconazole (DIFLUCAN) 150 MG tablet   Oral   Take 1 tablet (150 mg total) by mouth once a week.   4 tablet   0   . furosemide (LASIX) 40 MG tablet      TAKE 1 TABLET BY MOUTH EVERY DAY   30 tablet   6   . gabapentin (NEURONTIN) 400 MG capsule   Oral   Take 400-1,200 mg by mouth 3 (three) times daily. Take one capsule in the morning, one capsule 7 hours later and then three capsules at bedtime.      0   . ibuprofen (ADVIL,MOTRIN) 200 MG tablet   Oral   Take 600 mg by mouth every 6 (six) hours as needed (For pain.).         Marland Kitchen lamoTRIgine (LAMICTAL) 200 MG tablet   Oral   Take 200 mg by mouth daily.         Marland Kitchen levothyroxine (SYNTHROID, LEVOTHROID) 112 MCG tablet      TAKE 2 TABLETS (224 MCG TOTAL) BY MOUTH DAILY BEFORE BREAKFAST Patient taking differently: Take 224 mcg by mouth daily before breakfast.    60 tablet   6     New dose   . metFORMIN (GLUCOPHAGE) 850 MG tablet   Oral   Take 1 tablet (850 mg total) by mouth 2 (two) times daily with a meal.   180 tablet   1     New dose   . nystatin ointment (MYCOSTATIN)   Topical   Apply 1 application topically daily as needed. Apply to red areas in groin   30  g   1   . omeprazole (PRILOSEC) 40 MG capsule      TAKE 1 CAPSULE (40 MG TOTAL) BY MOUTH DAILY. Patient taking differently: Take 40 mg by mouth daily.    30 capsule   6   . ondansetron (ZOFRAN) 4 MG tablet   Oral   Take 1 tablet (4 mg  total) by mouth 3 (three) times daily before meals.   90 tablet   0   . potassium chloride (KLOR-CON 10) 10 MEQ tablet      TAKE 2 TABLETS (20 MEQ TOTAL) BY MOUTH DAILY. Patient taking differently: Take 20 mEq by mouth daily.    180 tablet   3   . ramipril (ALTACE) 10 MG capsule      TAKE 1 CAPSULE (10 MG TOTAL) BY MOUTH DAILY.   90 capsule   1   . rOPINIRole (REQUIP) 3 MG tablet      TAKE 1 TABLET(3 MG) BY MOUTH AT BEDTIME   90 tablet   0   . Sod Picosulfate-Mag Ox-Cit Acd ([place]) 10-3.5-12 MG-GM-GM PACK   Oral   Take 1 kit by mouth once.   1 each   0   . triamcinolone cream (KENALOG) 0.1 %      APPLY TO AFFECTED AREA ON EXTERNAL EAR TWICE DAILY. DO NOT USE FOR MORE THAN 2 WEEKS AT A TIME Patient taking differently: APPLY TO AFFECTED AREA DAILY AS NEEDED FOR YEAST INFECTION.   30 g   0   . Vilazodone HCl (VIIBRYD) 40 MG TABS   Oral   Take 40 mg by mouth every morning.           Current Medication:  Current outpatient prescriptions:  .  ALPRAZolam (XANAX) 0.5 MG tablet, Take 0.5 mg by mouth at bedtime., Disp: , Rfl: 2 .  aspirin EC 81 MG tablet, Take 81 mg by mouth daily., Disp: , Rfl:  .  atorvastatin (LIPITOR) 40 MG tablet, TAKE 1 TABLET (40 MG TOTAL) BY MOUTH AT BEDTIME. (Patient taking differently: Take 40 mg by mouth at bedtime. ), Disp: 90 tablet, Rfl: 2 .  cyanocobalamin (,VITAMIN B-12,) 1000 MCG/ML injection, Inject 1 mL (1,000 mcg total) into the muscle every 30 (thirty) days., Disp: 10 mL, Rfl: 3 .  fluconazole (DIFLUCAN) 150 MG tablet, Take 1 tablet (150 mg total) by mouth once a week., Disp: 4 tablet, Rfl: 0 .  furosemide (LASIX) 40 MG tablet, TAKE 1 TABLET BY MOUTH EVERY DAY, Disp: 30 tablet, Rfl: 6 .  gabapentin (NEURONTIN) 400 MG capsule, Take 400-1,200 mg by mouth 3 (three) times daily. Take one capsule in the morning, one capsule 7 hours later and then three capsules at bedtime., Disp: , Rfl: 0 .  ibuprofen (ADVIL,MOTRIN) 200 MG tablet, Take  600 mg by mouth every 6 (six) hours as needed (For pain.)., Disp: , Rfl:  .  lamoTRIgine (LAMICTAL) 200 MG tablet, Take 200 mg by mouth daily., Disp: , Rfl:  .  levothyroxine (SYNTHROID, LEVOTHROID) 112 MCG tablet, TAKE 2 TABLETS (224 MCG TOTAL) BY MOUTH DAILY BEFORE BREAKFAST (Patient taking differently: Take 224 mcg by mouth daily before breakfast. ), Disp: 60 tablet, Rfl: 6 .  metFORMIN (GLUCOPHAGE) 850 MG tablet, Take 1 tablet (850 mg total) by mouth 2 (two) times daily with a meal., Disp: 180 tablet, Rfl: 1 .  nystatin ointment (MYCOSTATIN), Apply 1 application topically daily as needed. Apply to red areas in groin, Disp: 30 g, Rfl: 1 .  omeprazole (  PRILOSEC) 40 MG capsule, TAKE 1 CAPSULE (40 MG TOTAL) BY MOUTH DAILY. (Patient taking differently: Take 40 mg by mouth daily. ), Disp: 30 capsule, Rfl: 6 .  ondansetron (ZOFRAN) 4 MG tablet, Take 1 tablet (4 mg total) by mouth 3 (three) times daily before meals., Disp: 90 tablet, Rfl: 0 .  potassium chloride (KLOR-CON 10) 10 MEQ tablet, TAKE 2 TABLETS (20 MEQ TOTAL) BY MOUTH DAILY. (Patient taking differently: Take 20 mEq by mouth daily. ), Disp: 180 tablet, Rfl: 3 .  ramipril (ALTACE) 10 MG capsule, TAKE 1 CAPSULE (10 MG TOTAL) BY MOUTH DAILY., Disp: 90 capsule, Rfl: 1 .  rOPINIRole (REQUIP) 3 MG tablet, TAKE 1 TABLET(3 MG) BY MOUTH AT BEDTIME, Disp: 90 tablet, Rfl: 0 .  Sod Picosulfate-Mag Ox-Cit Acd ([place]) 10-3.5-12 MG-GM-GM PACK, Take 1 kit by mouth once., Disp: 1 each, Rfl: 0 .  triamcinolone cream (KENALOG) 0.1 %, APPLY TO AFFECTED AREA ON EXTERNAL EAR TWICE DAILY. DO NOT USE FOR MORE THAN 2 WEEKS AT A TIME (Patient taking differently: APPLY TO AFFECTED AREA DAILY AS NEEDED FOR YEAST INFECTION.), Disp: 30 g, Rfl: 0 .  Vilazodone HCl (VIIBRYD) 40 MG TABS, Take 40 mg by mouth every morning., Disp: , Rfl:   Current facility-administered medications:  .  triamcinolone acetonide (KENALOG) 10 MG/ML injection 10 mg, 10 mg, Other, Once, Titorya  Stover, DPM    ALLERGIES   Augmentin; Erythromycin base; and Neomycin     REVIEW OF SYSTEMS   Review of Systems  Constitutional: Positive for malaise/fatigue. Negative for fever, chills, weight loss and diaphoresis.  HENT: Negative for congestion and hearing loss.   Eyes: Negative for blurred vision and double vision.  Respiratory: Negative for cough, hemoptysis, sputum production, shortness of breath and wheezing.   Cardiovascular: Negative for chest pain, palpitations and orthopnea.  Gastrointestinal: Negative for heartburn, nausea, vomiting and abdominal pain.  Genitourinary: Negative for dysuria and urgency.  Musculoskeletal: Negative for myalgias, back pain and neck pain.  Skin: Negative for rash.  Neurological: Negative for dizziness, tingling, tremors, weakness and headaches.  Endo/Heme/Allergies: Does not bruise/bleed easily.  Psychiatric/Behavioral: Negative for depression, suicidal ideas and substance abuse.  All other systems reviewed and are negative.    VS: BP 130/72 mmHg  Pulse 84  Ht 5' 5.5" (1.664 m)  Wt 322 lb (146.058 kg)  BMI 52.75 kg/m2  SpO2 94%     PHYSICAL EXAM  Physical Exam  Constitutional: She is oriented to person, place, and time. She appears well-developed and well-nourished. No distress.  HENT:  Head: Normocephalic and atraumatic.  Mouth/Throat: No oropharyngeal exudate.  Eyes: Pupils are equal, round, and reactive to light. No scleral icterus.  Neck: Normal range of motion. Neck supple.  Cardiovascular: Normal rate and regular rhythm.   No murmur heard. Pulmonary/Chest: No stridor. No respiratory distress. She has no wheezes. She has no rales.  Abdominal: Soft. Bowel sounds are normal.  Musculoskeletal: Normal range of motion. She exhibits no edema.  Neurological: She is alert and oriented to person, place, and time. No cranial nerve deficit.  Skin: Skin is warm. No rash noted. She is not diaphoretic.            ASSESSMENT/PLAN   65 yo morbidly obese white female with OSA with extreme fatigue and tiredness  1.referral for split night sleep study needed 2.recommend weight loss, diet and exercise    The Patient requires high complexity decision making for assessment and support, frequent evaluation and titration of therapies, application  of advanced monitoring technologies and extensive interpretation of multiple databases.   Patient satisfied with Plan of action and management. All questions answered  Corrin Parker, M.D.  [facility]  Medical Director [place] Director [facility] Cardio-Pulmonary Department

## 2015-12-03 NOTE — Patient Instructions (Signed)
CPAP titration/sleep study referral   Sleep Apnea  Sleep apnea is a sleep disorder characterized by abnormal pauses in breathing while you sleep. When your breathing pauses, the level of oxygen in your blood decreases. This causes you to move out of deep sleep and into light sleep. As a result, your quality of sleep is poor, and the system that carries your blood throughout your body (cardiovascular system) experiences stress. If sleep apnea remains untreated, the following conditions can develop:  High blood pressure (hypertension).  Coronary artery disease.  Inability to achieve or maintain an erection (impotence).  Impairment of your thought process (cognitive dysfunction). There are three types of sleep apnea: 1. Obstructive sleep apnea--Pauses in breathing during sleep because of a blocked airway. 2. Central sleep apnea--Pauses in breathing during sleep because the area of the brain that controls your breathing does not send the correct signals to the muscles that control breathing. 3. Mixed sleep apnea--A combination of both obstructive and central sleep apnea. RISK FACTORS The following risk factors can increase your risk of developing sleep apnea:  Being overweight.  Smoking.  Having narrow passages in your nose and throat.  Being of older age.  Being female.  Alcohol use.  Sedative and tranquilizer use.  Ethnicity. Among individuals younger than 35 years, African Americans are at increased risk of sleep apnea. SYMPTOMS   Difficulty staying asleep.  Daytime sleepiness and fatigue.  Loss of energy.  Irritability.  Loud, heavy snoring.  Morning headaches.  Trouble concentrating.  Forgetfulness.  Decreased interest in sex.  Unexplained sleepiness. DIAGNOSIS  In order to diagnose sleep apnea, your caregiver will perform a physical examination. A sleep study done in the comfort of your own home may be appropriate if you are otherwise healthy. Your caregiver  may also recommend that you spend the night in a sleep lab. In the sleep lab, several monitors record information about your heart, lungs, and brain while you sleep. Your leg and arm movements and blood oxygen level are also recorded. TREATMENT The following actions may help to resolve mild sleep apnea:  Sleeping on your side.   Using a decongestant if you have nasal congestion.   Avoiding the use of depressants, including alcohol, sedatives, and narcotics.   Losing weight and modifying your diet if you are overweight. There also are devices and treatments to help open your airway:  Oral appliances. These are custom-made mouthpieces that shift your lower jaw forward and slightly open your bite. This opens your airway.  Devices that create positive airway pressure. This positive pressure "splints" your airway open to help you breathe better during sleep. The following devices create positive airway pressure:  Continuous positive airway pressure (CPAP) device. The CPAP device creates a continuous level of air pressure with an air pump. The air is delivered to your airway through a mask while you sleep. This continuous pressure keeps your airway open.  Nasal expiratory positive airway pressure (EPAP) device. The EPAP device creates positive air pressure as you exhale. The device consists of single-use valves, which are inserted into each nostril and held in place by adhesive. The valves create very little resistance when you inhale but create much more resistance when you exhale. That increased resistance creates the positive airway pressure. This positive pressure while you exhale keeps your airway open, making it easier to breath when you inhale again.  Bilevel positive airway pressure (BPAP) device. The BPAP device is used mainly in patients with central sleep apnea. This device is  similar to the CPAP device because it also uses an air pump to deliver continuous air pressure through a mask.  However, with the BPAP machine, the pressure is set at two different levels. The pressure when you exhale is lower than the pressure when you inhale.  Surgery. Typically, surgery is only done if you cannot comply with less invasive treatments or if the less invasive treatments do not improve your condition. Surgery involves removing excess tissue in your airway to create a wider passage way.   This information is not intended to replace advice given to you by your health care provider. Make sure you discuss any questions you have with your health care provider.   Document Released: 04/21/2002 Document Revised: 05/22/2014 Document Reviewed: 09/07/2011 Elsevier Interactive Patient Education [employer].

## 2015-12-06 ENCOUNTER — Other Ambulatory Visit: Payer: Self-pay | Admitting: Family Medicine

## 2015-12-09 ENCOUNTER — Institutional Professional Consult (permissible substitution): Payer: Medicare HMO | Admitting: Internal Medicine

## 2015-12-09 ENCOUNTER — Other Ambulatory Visit: Payer: Medicare HMO

## 2015-12-16 ENCOUNTER — Ambulatory Visit: Payer: Medicare HMO | Admitting: Family Medicine

## 2015-12-17 ENCOUNTER — Ambulatory Visit (INDEPENDENT_AMBULATORY_CARE_PROVIDER_SITE_OTHER): Payer: Medicare HMO | Admitting: Family Medicine

## 2015-12-17 ENCOUNTER — Encounter: Payer: Self-pay | Admitting: Family Medicine

## 2015-12-17 VITALS — BP 136/66 | HR 68 | Temp 98.4°F | Ht 65.0 in | Wt 327.5 lb

## 2015-12-17 DIAGNOSIS — E1165 Type 2 diabetes mellitus with hyperglycemia: Secondary | ICD-10-CM | POA: Diagnosis not present

## 2015-12-17 DIAGNOSIS — E1140 Type 2 diabetes mellitus with diabetic neuropathy, unspecified: Secondary | ICD-10-CM

## 2015-12-17 DIAGNOSIS — F4321 Adjustment disorder with depressed mood: Secondary | ICD-10-CM

## 2015-12-17 DIAGNOSIS — E559 Vitamin D deficiency, unspecified: Secondary | ICD-10-CM

## 2015-12-17 DIAGNOSIS — Z1239 Encounter for other screening for malignant neoplasm of breast: Secondary | ICD-10-CM

## 2015-12-17 DIAGNOSIS — Z0000 Encounter for general adult medical examination without abnormal findings: Secondary | ICD-10-CM

## 2015-12-17 DIAGNOSIS — J989 Respiratory disorder, unspecified: Secondary | ICD-10-CM

## 2015-12-17 DIAGNOSIS — E039 Hypothyroidism, unspecified: Secondary | ICD-10-CM

## 2015-12-17 DIAGNOSIS — E538 Deficiency of other specified B group vitamins: Secondary | ICD-10-CM

## 2015-12-17 DIAGNOSIS — E785 Hyperlipidemia, unspecified: Secondary | ICD-10-CM | POA: Diagnosis not present

## 2015-12-17 DIAGNOSIS — IMO0002 Reserved for concepts with insufficient information to code with codable children: Secondary | ICD-10-CM

## 2015-12-17 DIAGNOSIS — Z7189 Other specified counseling: Secondary | ICD-10-CM

## 2015-12-17 LAB — HEMOGLOBIN A1C: Hgb A1c MFr Bld: 7.2 % — ABNORMAL HIGH (ref 4.6–6.5)

## 2015-12-17 LAB — LIPID PANEL
CHOLESTEROL: 248 mg/dL — AB (ref 0–200)
HDL: 56.1 mg/dL (ref >39.00)
LDL CALC: 158 mg/dL — AB (ref 0–99)
NonHDL: 192.14
TRIGLYCERIDES: 172 mg/dL — AB (ref 0.0–149.0)
Total CHOL/HDL Ratio: 4
VLDL: 34.4 mg/dL (ref 0.0–40.0)

## 2015-12-17 LAB — VITAMIN B12: Vitamin B-12: 257 pg/mL (ref 211–911)

## 2015-12-17 MED ORDER — CYANOCOBALAMIN 1000 MCG/ML IJ SOLN
1000.0000 ug | Freq: Once | INTRAMUSCULAR | Status: AC
  Administered 2015-12-17: 1000 ug via INTRAMUSCULAR

## 2015-12-17 NOTE — Assessment & Plan Note (Signed)
Pending A1c. Continue metformin 850mg  bid.

## 2015-12-17 NOTE — Assessment & Plan Note (Signed)
Pt motivated to restart exercise regimen, currently does not regularly exercise.

## 2015-12-17 NOTE — Assessment & Plan Note (Signed)

## 2015-12-17 NOTE — Assessment & Plan Note (Addendum)
Advanced directive: scanned 02/2012. Daughter Stephanie RN is HC proxy. Does not want prolonged life support if terminal condition, daughter may make decisions. Full code. Ok with temporary life support. 

## 2015-12-17 NOTE — Assessment & Plan Note (Signed)
Preventative protocols reviewed and updated unless pt declined. Discussed healthy diet and lifestyle.  

## 2015-12-17 NOTE — Progress Notes (Signed)
Pre visit review using our clinic review tool, if applicable. No additional management support is needed unless otherwise documented below in the visit note. 

## 2015-12-17 NOTE — Addendum Note (Signed)
Addended by: Modena Nunnery on: 12/17/2015 11:43 AM   Modules accepted: Orders

## 2015-12-17 NOTE — Assessment & Plan Note (Addendum)
Should be taking 1000 IU daily.

## 2015-12-17 NOTE — Patient Instructions (Addendum)
Call your insurance about the shingles shot to see if it is covered or how much it would cost and where is cheaper (here or pharmacy).  If you want to receive here, call for nurse visit.  b12 shot today. Pass by our referral coordinator to schedule mammogram at [facility].  Work on increased activity - get back into aquatic exercise routine.  Schedule well woman as you're due.  Good to see you today, call us with questions. Return as needed or in 6 months for follow up visit.  Health Maintenance, Female Adopting a healthy lifestyle and getting preventive care can go a long way to promote health and wellness. Talk with your health care provider about what schedule of regular examinations is right for you. This is a good chance for you to check in with your provider about disease prevention and staying healthy. In between checkups, there are plenty of things you can do on your own. Experts have done a lot of research about which lifestyle changes and preventive measures are most likely to keep you healthy. Ask your health care provider for more information. WEIGHT AND DIET  Eat a healthy diet  Be sure to include plenty of vegetables, fruits, low-fat dairy products, and lean protein.  Do not eat a lot of foods high in solid fats, added sugars, or salt.  Get regular exercise. This is one of the most important things you can do for your health.  Most adults should exercise for at least 150 minutes each week. The exercise should increase your heart rate and make you sweat (moderate-intensity exercise).  Most adults should also do strengthening exercises at least twice a week. This is in addition to the moderate-intensity exercise.  Maintain a healthy weight  Body mass index (BMI) is a measurement that can be used to identify possible weight problems. It estimates body fat based on height and weight. Your health care provider can help determine your BMI and help you achieve or maintain a healthy  weight.  For females 45 years of age and older:   A BMI below 18.5 is considered underweight.  A BMI of 18.5 to 24.9 is normal.  A BMI of 25 to 29.9 is considered overweight.  A BMI of 30 and above is considered obese.  Watch levels of cholesterol and blood lipids  You should start having your blood tested for lipids and cholesterol at 64 years of age, then have this test every 5 years.  You may need to have your cholesterol levels checked more often if:  Your lipid or cholesterol levels are high.  You are older than 64 years of age.  You are at high risk for heart disease.  CANCER SCREENING   Lung Cancer  Lung cancer screening is recommended for adults 26-3 years old who are at high risk for lung cancer because of a history of smoking.  A yearly low-dose CT scan of the lungs is recommended for people who:  Currently smoke.  Have quit within the past 15 years.  Have at least a 30-pack-year history of smoking. A pack year is smoking an average of one pack of cigarettes a day for 1 year.  Yearly screening should continue until it has been 15 years since you quit.  Yearly screening should stop if you develop a health problem that would prevent you from having lung cancer treatment.  Breast Cancer  Practice breast self-awareness. This means understanding how your breasts normally appear and feel.  It also means  doing regular breast self-exams. Let your health care provider know about any changes, no matter how small.  If you are in your 20s or 30s, you should have a clinical breast exam (CBE) by a health care provider every 1-3 years as part of a regular health exam.  If you are 65 or older, have a CBE every year. Also consider having a breast X-ray (mammogram) every year.  If you have a family history of breast cancer, talk to your health care provider about genetic screening.  If you are at high risk for breast cancer, talk to your health care provider about  having an MRI and a mammogram every year.  Breast cancer gene (BRCA) assessment is recommended for women who have family members with BRCA-related cancers. BRCA-related cancers include:  Breast.  Ovarian.  Tubal.  Peritoneal cancers.  Results of the assessment will determine the need for genetic counseling and BRCA1 and BRCA2 testing. Cervical Cancer Your health care provider may recommend that you be screened regularly for cancer of the pelvic organs (ovaries, uterus, and vagina). This screening involves a pelvic examination, including checking for microscopic changes to the surface of your cervix (Pap test). You may be encouraged to have this screening done every 3 years, beginning at age 37.  For women ages 21-65, health care providers may recommend pelvic exams and Pap testing every 3 years, or they may recommend the Pap and pelvic exam, combined with testing for human papilloma virus (HPV), every 5 years. Some types of HPV increase your risk of cervical cancer. Testing for HPV may also be done on women of any age with unclear Pap test results.  Other health care providers may not recommend any screening for nonpregnant women who are considered low risk for pelvic cancer and who do not have symptoms. Ask your health care provider if a screening pelvic exam is right for you.  If you have had past treatment for cervical cancer or a condition that could lead to cancer, you need Pap tests and screening for cancer for at least 20 years after your treatment. If Pap tests have been discontinued, your risk factors (such as having a new sexual partner) need to be reassessed to determine if screening should resume. Some women have medical problems that increase the chance of getting cervical cancer. In these cases, your health care provider may recommend more frequent screening and Pap tests. Colorectal Cancer  This type of cancer can be detected and often prevented.  Routine colorectal cancer  screening usually begins at 64 years of age and continues through 64 years of age.  Your health care provider may recommend screening at an earlier age if you have risk factors for colon cancer.  Your health care provider may also recommend using home test kits to check for hidden blood in the stool.  A small camera at the end of a tube can be used to examine your colon directly (sigmoidoscopy or colonoscopy). This is done to check for the earliest forms of colorectal cancer.  Routine screening usually begins at age 36.  Direct examination of the colon should be repeated every 5-10 years through 64 years of age. However, you may need to be screened more often if early forms of precancerous polyps or small growths are found. Skin Cancer  Check your skin from head to toe regularly.  Tell your health care provider about any new moles or changes in moles, especially if there is a change in a mole's shape or  color.  Also tell your health care provider if you have a mole that is larger than the size of a pencil eraser.  Always use sunscreen. Apply sunscreen liberally and repeatedly throughout the day.  Protect yourself by wearing long sleeves, pants, a wide-brimmed hat, and sunglasses whenever you are outside. HEART DISEASE, DIABETES, AND HIGH BLOOD PRESSURE   High blood pressure causes heart disease and increases the risk of stroke. High blood pressure is more likely to develop in:  People who have blood pressure in the high end of the normal range (130-139/85-89 mm Hg).  People who are overweight or obese.  People who are African American.  If you are 43-47 years of age, have your blood pressure checked every 3-5 years. If you are 88 years of age or older, have your blood pressure checked every year. You should have your blood pressure measured twice--once when you are at a hospital or clinic, and once when you are not at a hospital or clinic. Record the average of the two measurements.  To check your blood pressure when you are not at a hospital or clinic, you can use:  An automated blood pressure machine at a pharmacy.  A home blood pressure monitor.  If you are between 69 years and 69 years old, ask your health care provider if you should take aspirin to prevent strokes.  Have regular diabetes screenings. This involves taking a blood sample to check your fasting blood sugar level.  If you are at a normal weight and have a low risk for diabetes, have this test once every three years after 64 years of age.  If you are overweight and have a high risk for diabetes, consider being tested at a younger age or more often. PREVENTING INFECTION  Hepatitis B  If you have a higher risk for hepatitis B, you should be screened for this virus. You are considered at high risk for hepatitis B if:  You were born in a country where hepatitis B is common. Ask your health care provider which countries are considered high risk.  Your parents were born in a high-risk country, and you have not been immunized against hepatitis B (hepatitis B vaccine).  You have HIV or AIDS.  You use needles to inject street drugs.  You live with someone who has hepatitis B.  You have had sex with someone who has hepatitis B.  You get hemodialysis treatment.  You take certain medicines for conditions, including cancer, organ transplantation, and autoimmune conditions. Hepatitis C  Blood testing is recommended for:  Everyone born from [birth year] through 1964.  Anyone with known risk factors for hepatitis C. Sexually transmitted infections (STIs)  You should be screened for sexually transmitted infections (STIs) including gonorrhea and chlamydia if:  You are sexually active and are younger than 64 years of age.  You are older than 64 years of age and your health care provider tells you that you are at risk for this type of infection.  Your sexual activity has changed since you were last screened and  you are at an increased risk for chlamydia or gonorrhea. Ask your health care provider if you are at risk.  If you do not have HIV, but are at risk, it may be recommended that you take a prescription medicine daily to prevent HIV infection. This is called pre-exposure prophylaxis (PrEP). You are considered at risk if:  You are sexually active and do not regularly use condoms or know the HIV status  of your partner(s).  You take drugs by injection.  You are sexually active with a partner who has HIV. Talk with your health care provider about whether you are at high risk of being infected with HIV. If you choose to begin PrEP, you should first be tested for HIV. You should then be tested every 3 months for as long as you are taking PrEP.  PREGNANCY   If you are premenopausal and you may become pregnant, ask your health care provider about preconception counseling.  If you may become pregnant, take 400 to 800 micrograms (mcg) of folic acid every day.  If you want to prevent pregnancy, talk to your health care provider about birth control (contraception). OSTEOPOROSIS AND MENOPAUSE   Osteoporosis is a disease in which the bones lose minerals and strength with aging. This can result in serious bone fractures. Your risk for osteoporosis can be identified using a bone density scan.  If you are 13 years of age or older, or if you are at risk for osteoporosis and fractures, ask your health care provider if you should be screened.  Ask your health care provider whether you should take a calcium or vitamin D supplement to lower your risk for osteoporosis.  Menopause may have certain physical symptoms and risks.  Hormone replacement therapy may reduce some of these symptoms and risks. Talk to your health care provider about whether hormone replacement therapy is right for you.  HOME CARE INSTRUCTIONS   Schedule regular health, dental, and eye exams.  Stay current with your immunizations.   Do  not use any tobacco products including cigarettes, chewing tobacco, or electronic cigarettes.  If you are pregnant, do not drink alcohol.  If you are breastfeeding, limit how much and how often you drink alcohol.  Limit alcohol intake to no more than 1 drink per day for nonpregnant women. One drink equals 12 ounces of beer, 5 ounces of wine, or 1 ounces of hard liquor.  Do not use street drugs.  Do not share needles.  Ask your health care provider for help if you need support or information about quitting drugs.  Tell your health care provider if you often feel depressed.  Tell your health care provider if you have ever been abused or do not feel safe at home.   This information is not intended to replace advice given to you by your health care provider. Make sure you discuss any questions you have with your health care provider.   Document Released: 11/14/2010 Document Revised: 05/22/2014 Document Reviewed: 04/02/2013 Elsevier Interactive Patient Education [employer].

## 2015-12-17 NOTE — Assessment & Plan Note (Signed)
Pending FLP today. Continue atorvastatin.

## 2015-12-17 NOTE — Progress Notes (Signed)
BP 136/66   Pulse 68   Temp 98.4 F (36.9 C) (Oral)   Ht 5\' 5"  (1.651 m)   Wt (!) 327 lb 8 oz (148.6 kg)   SpO2 94%   BMI 54.50 kg/m    CC: medicare wellness visit Subjective:    Patient ID: [name], female    DOB: 1951-04-13, 65 y.o.   MRN: [id number]  HPI: [name] is a 65 y.o. female presenting on 12/17/2015 for Annual Exam (Medicare)   Hearing screen passed Vision screen with eye doctor Multiple falls.  Depression - on med and stable. Sees Dr Albina Billet counselor and psychiatrist, stable on lamictal and vybriid.   Preventative: Colon cancer screening - pending colonoscopy with Dr Carlean Purl [phone].  Well woman with OBGYN - normal pap and breast/mammo in past, due for f/u Helane Rima)  Last mammogram - 2012 - has not returned for rpt. Requests we schedule at [facility].  Flu shot yearly Tdap 2013 Pneumovax 02/2011  zostavax - will check with insurance Advanced directive: scanned 02/2012. Daughter Colletta Maryland RN is [facility] proxy. Does not want prolonged life support if terminal condition, daughter may make decisions. Full code. [place] with temporary life support. Seat belt use discussed Sunscreen use discussed. No changing moles on skin. No smoking No alcohol.  Caffeine: 1 cup coffee/day  Lives with daughter  [facility]. Diet: watching diet, low sodium. Good water. Fruits/vegetables daily.   Relevant past medical, surgical, family and social history reviewed and updated as indicated. Interim medical history since our last visit reviewed. Allergies and medications reviewed and updated. Current Outpatient Prescriptions on File Prior to Visit  Medication Sig  . ALPRAZolam (XANAX) 0.5 MG tablet Take 0.5 mg by mouth at bedtime.  Marland Kitchen aspirin EC 81 MG tablet Take 81 mg by mouth every other day.   Marland Kitchen atorvastatin (LIPITOR) 40 MG tablet TAKE 1 TABLET (40 MG TOTAL) BY MOUTH AT BEDTIME. (Patient taking differently: Take 40 mg by mouth at  bedtime. )  . cetirizine (ZYRTEC) 10 MG tablet Take 10 mg by mouth daily as needed for allergies.  . cyanocobalamin (,VITAMIN B-12,) 1000 MCG/ML injection Inject 1 mL (1,000 mcg total) into the muscle every 30 (thirty) days.  . furosemide (LASIX) 40 MG tablet TAKE 1 TABLET BY MOUTH EVERY DAY  . gabapentin (NEURONTIN) 400 MG capsule Take 400-1,200 mg by mouth 3 (three) times daily. Take one capsule in the morning, one capsule 7 hours later and then three capsules at bedtime.  Marland Kitchen ibuprofen (ADVIL,MOTRIN) 200 MG tablet Take 600 mg by mouth every 6 (six) hours as needed (For pain.).  Marland Kitchen lamoTRIgine (LAMICTAL) 200 MG tablet Take 200 mg by mouth daily.  Marland Kitchen levothyroxine (SYNTHROID, LEVOTHROID) 112 MCG tablet TAKE 2 TABLETS (224 MCG TOTAL) BY MOUTH DAILY BEFORE BREAKFAST (Patient taking differently: Take 224 mcg by mouth daily before breakfast. )  . metFORMIN (GLUCOPHAGE) 850 MG tablet Take 1 tablet (850 mg total) by mouth 2 (two) times daily with a meal.  . nystatin ointment (MYCOSTATIN) Apply 1 application topically daily as needed. Apply to red areas in groin  . omeprazole (PRILOSEC) 40 MG capsule TAKE 1 CAPSULE (40 MG TOTAL) BY MOUTH DAILY. (Patient taking differently: Take 40 mg by mouth daily as needed. )  . ondansetron (ZOFRAN) 4 MG tablet Take 1 tablet (4 mg total) by mouth 3 (three) times daily before meals. (Patient taking differently: Take 4 mg by mouth 3 (three) times daily as needed  for nausea. )  . potassium chloride (KLOR-CON 10) 10 MEQ tablet TAKE 2 TABLETS (20 MEQ TOTAL) BY MOUTH DAILY. (Patient taking differently: Take 20 mEq by mouth daily. )  . ramipril (ALTACE) 10 MG capsule TAKE 1 CAPSULE BY MOUTH EVERY DAY  . rOPINIRole (REQUIP) 3 MG tablet TAKE 1 TABLET(3 MG) BY MOUTH AT BEDTIME  . triamcinolone cream (KENALOG) 0.1 % APPLY TO AFFECTED AREA ON EXTERNAL EAR TWICE DAILY. DO NOT USE FOR MORE THAN 2 WEEKS AT A TIME (Patient taking differently: APPLY TO AFFECTED AREA DAILY AS NEEDED FOR YEAST  INFECTION.)  . Vilazodone HCl (VIIBRYD) 40 MG TABS Take 40 mg by mouth every morning.   Current Facility-Administered Medications on File Prior to Visit  Medication  . triamcinolone acetonide (KENALOG) 10 MG/ML injection 10 mg    Review of Systems  Constitutional: Negative for activity change, appetite change, chills, fatigue, fever and unexpected weight change.  HENT: Negative for hearing loss.   Eyes: Negative for visual disturbance.  Respiratory: Negative for cough, chest tightness, shortness of breath and wheezing.   Cardiovascular: Negative for chest pain, palpitations and leg swelling.  Gastrointestinal: Negative for abdominal distention, abdominal pain, blood in stool, constipation, diarrhea, nausea and vomiting.  Genitourinary: Negative for difficulty urinating and hematuria.  Musculoskeletal: Negative for arthralgias, myalgias and neck pain.  Skin: Negative for rash.  Neurological: Negative for dizziness, seizures, syncope and headaches.  Hematological: Negative for adenopathy. Bruises/bleeds easily.  Psychiatric/Behavioral: Negative for dysphoric mood. The patient is not nervous/anxious.    Per HPI unless specifically indicated in ROS section     Objective:    BP 136/66   Pulse 68   Temp 98.4 F (36.9 C) (Oral)   Ht 5\' 5"  (1.651 m)   Wt (!) 327 lb 8 oz (148.6 kg)   SpO2 94%   BMI 54.50 kg/m   Wt Readings from Last 3 Encounters:  12/17/15 (!) 327 lb 8 oz (148.6 kg)  12/03/15 (!) 322 lb (146.1 kg)  11/26/15 (!) 328 lb (148.8 kg)    Physical Exam  Constitutional: She is oriented to person, place, and time. She appears well-developed and well-nourished. No distress.  HENT:  Head: Normocephalic and atraumatic.  Right Ear: Hearing, tympanic membrane, external ear and ear canal normal.  Left Ear: Hearing, tympanic membrane, external ear and ear canal normal.  Nose: Nose normal.  Mouth/Throat: Uvula is midline, oropharynx is clear and moist and mucous membranes are  normal. No oropharyngeal exudate, posterior oropharyngeal edema or posterior oropharyngeal erythema.  Eyes: Conjunctivae and EOM are normal. Pupils are equal, round, and reactive to light. No scleral icterus.  Neck: Normal range of motion. Neck supple. Carotid bruit is not present. No thyromegaly present.  Cardiovascular: Normal rate, regular rhythm, normal heart sounds and intact distal pulses.   No murmur heard. Pulses:      Radial pulses are 2+ on the right side, and 2+ on the left side.  Pulmonary/Chest: Effort normal and breath sounds normal. No respiratory distress. She has no wheezes. She has no rales.  Abdominal: Soft. Bowel sounds are normal. She exhibits no distension and no mass. There is no tenderness. There is no rebound and no guarding.  Musculoskeletal: Normal range of motion. She exhibits no edema.  Lymphadenopathy:    She has no cervical adenopathy.  Neurological: She is alert and oriented to person, place, and time.  CN grossly intact, station and gait intact Recall 3/3  Calculation 5/5 serial 3s  Skin: Skin is  warm and dry. No rash noted.  Psychiatric: She has a normal mood and affect. Her behavior is normal. Judgment and thought content normal.  Nursing note and vitals reviewed.  Results for orders placed or performed in visit on 08/16/15  Hepatitis C antibody  Result Value Ref Range   HCV Ab NEGATIVE NEGATIVE      Assessment & Plan:   Problem List Items Addressed This Visit    Adjustment disorder with depressed mood    Continue f/u with psych.      Advanced care planning/counseling discussion    Advanced directive: scanned 02/2012. Daughter Colletta Maryland RN is [facility] proxy. Does not want prolonged life support if terminal condition, daughter may make decisions. Full code. [place] with temporary life support.      B12 deficiency    b12 today.       Extreme obesity with respiratory disorder (HCC)    Pt motivated to restart exercise regimen, currently does not regularly  exercise.       Health maintenance examination    Preventative protocols reviewed and updated unless pt declined. Discussed healthy diet and lifestyle.       Hyperlipidemia    Pending FLP today. Continue atorvastatin.      Hypothyroidism    Pending TSH. Denies thyroid symptoms.      Medicare annual wellness visit, subsequent - Primary    I have personally reviewed the Medicare Annual Wellness questionnaire and have noted 1. The patient's medical and social history 2. Their use of alcohol, tobacco or illicit drugs 3. Their current medications and supplements 4. The patient's functional ability including ADL's, fall risks, home safety risks and hearing or visual impairment. Cognitive function has been assessed and addressed as indicated.  5. Diet and physical activity 6. Evidence for depression or mood disorders The patients weight, height, BMI have been recorded in the chart. I have made referrals, counseling and provided education to the patient based on review of the above and I have provided the pt with a written personalized care plan for preventive services. Provider list updated.. See scanned questionairre as needed for further documentation. Reviewed preventative protocols and updated unless pt declined.       Type 2 diabetes mellitus, uncontrolled, with neuropathy (HCC)    Pending A1c. Continue metformin 850mg  bid.       Vitamin D deficiency    Should be taking 1000 IU daily.        Other Visit Diagnoses    Screening for breast cancer       Relevant Orders   MM Digital Screening       Follow up plan: Return in about 6 months (around 06/18/2016) for follow up visit.  Ria Bush, MD

## 2015-12-17 NOTE — Assessment & Plan Note (Signed)
Continue f/u with psych.  

## 2015-12-17 NOTE — Assessment & Plan Note (Signed)
b12 today

## 2015-12-17 NOTE — Assessment & Plan Note (Addendum)
Pending TSH. Denies thyroid symptoms.

## 2015-12-20 ENCOUNTER — Encounter (HOSPITAL_COMMUNITY): Payer: Self-pay | Admitting: *Deleted

## 2015-12-20 NOTE — Anesthesia Preprocedure Evaluation (Addendum)
Anesthesia Evaluation  Patient identified by MRN, date of birth, ID band  Reviewed: Allergy & Precautions, NPO status , Patient's Chart, lab work & pertinent test results  History of Anesthesia Complications (+) history of anesthetic complications (patient states she had a hard time waking up with O2 sats in the 85-89 range)  Airway Mallampati: II  TM Distance: >3 FB Neck ROM: Full   Comment: Previous Grade I view with [facility]  (+) Dental Advisory Given, Teeth Intact   Pulmonary neg shortness of breath, sleep apnea and Continuous Positive Airway Pressure Ventilation , neg COPD, neg recent URI, former smoker,    Pulmonary exam normal breath sounds clear to auscultation       Cardiovascular Exercise Tolerance: Poor hypertension, (-) angina+ CAD and +CHF (diastolic, EF 0000000)  (-) Orthopnea and (-) PND  Rhythm:Regular Rate:Normal  TTE 08/20/2011: Study Conclusions  - Left ventricle: The cavity size was normal. Wall thickness was normal. Systolic function was normal. The estimated ejection fraction was in the range of 55% to 60%. Indeterminant diastolic function. Wall motion was normal; there were no regional wall motion abnormalities. - Aortic valve: There was no stenosis. - Mitral valve: Mildly calcified annulus. Trivial regurgitation. - Left atrium: The atrium was mildly dilated. - Right ventricle: The cavity size was normal. Systolic function was normal. - Systemic veins: IVC measured 2.0 cm with normal respirophasic variation, suggesting RA pressure 6-10 mmHg. Impressions:   Neuro/Psych PSYCHIATRIC DISORDERS Anxiety Depression negative neurological ROS     GI/Hepatic GERD  Controlled,Fatty liver   Endo/Other  diabetes, Type 2Hypothyroidism Morbid obesity  Renal/GU negative Renal ROS     Musculoskeletal  (+) Arthritis , Osteoarthritis,    Abdominal (+) + obese,   Peds  Hematology negative  hematology ROS (+)   Anesthesia Other Findings Vitamin B12 and D deficiency, left adrenal myelipoma, RLS, optic neuritis, HLD  Reproductive/Obstetrics                            Anesthesia Physical Anesthesia Plan  ASA: III  Anesthesia Plan: MAC   Post-op Pain Management:    Induction: Intravenous  Airway Management Planned: Natural Airway and Simple Face Mask  Additional Equipment:   Intra-op Plan:   Post-operative Plan:   Informed Consent: I have reviewed the patients History and Physical, chart, labs and discussed the procedure including the risks, benefits and alternatives for the proposed anesthesia with the patient or authorized representative who has indicated his/her understanding and acceptance.   Dental advisory given  Plan Discussed with:   Anesthesia Plan Comments:        Anesthesia Quick Evaluation

## 2015-12-21 ENCOUNTER — Encounter: Payer: Self-pay | Admitting: Family Medicine

## 2015-12-21 ENCOUNTER — Ambulatory Visit (HOSPITAL_COMMUNITY): Payer: Medicare HMO | Admitting: Anesthesiology

## 2015-12-21 ENCOUNTER — Encounter (HOSPITAL_COMMUNITY): Payer: Self-pay | Admitting: Certified Registered"

## 2015-12-21 ENCOUNTER — Encounter (HOSPITAL_COMMUNITY)
Admission: RE | Disposition: A | Discharge status: Home / Self Care | Payer: Self-pay | Source: Ambulatory Visit | Attending: Internal Medicine

## 2015-12-21 ENCOUNTER — Ambulatory Visit (HOSPITAL_COMMUNITY)
Admission: RE | Admit: 2015-12-21 | Discharge: 2015-12-21 | Disposition: A | Discharge status: Home / Self Care | Payer: Medicare HMO | Source: Ambulatory Visit | Attending: Internal Medicine | Admitting: Internal Medicine

## 2015-12-21 DIAGNOSIS — Z87891 Personal history of nicotine dependence: Secondary | ICD-10-CM | POA: Diagnosis not present

## 2015-12-21 DIAGNOSIS — K621 Rectal polyp: Secondary | ICD-10-CM | POA: Insufficient documentation

## 2015-12-21 DIAGNOSIS — Z7982 Long term (current) use of aspirin: Secondary | ICD-10-CM | POA: Insufficient documentation

## 2015-12-21 DIAGNOSIS — K760 Fatty (change of) liver, not elsewhere classified: Secondary | ICD-10-CM | POA: Insufficient documentation

## 2015-12-21 DIAGNOSIS — E785 Hyperlipidemia, unspecified: Secondary | ICD-10-CM | POA: Diagnosis not present

## 2015-12-21 DIAGNOSIS — K219 Gastro-esophageal reflux disease without esophagitis: Secondary | ICD-10-CM | POA: Insufficient documentation

## 2015-12-21 DIAGNOSIS — Z6841 Body Mass Index (BMI) 40.0 and over, adult: Secondary | ICD-10-CM | POA: Insufficient documentation

## 2015-12-21 DIAGNOSIS — R21 Rash and other nonspecific skin eruption: Secondary | ICD-10-CM | POA: Insufficient documentation

## 2015-12-21 DIAGNOSIS — E119 Type 2 diabetes mellitus without complications: Secondary | ICD-10-CM | POA: Insufficient documentation

## 2015-12-21 DIAGNOSIS — R159 Full incontinence of feces: Secondary | ICD-10-CM | POA: Diagnosis not present

## 2015-12-21 DIAGNOSIS — F419 Anxiety disorder, unspecified: Secondary | ICD-10-CM | POA: Insufficient documentation

## 2015-12-21 DIAGNOSIS — M1990 Unspecified osteoarthritis, unspecified site: Secondary | ICD-10-CM | POA: Diagnosis not present

## 2015-12-21 DIAGNOSIS — E039 Hypothyroidism, unspecified: Secondary | ICD-10-CM | POA: Insufficient documentation

## 2015-12-21 DIAGNOSIS — G2581 Restless legs syndrome: Secondary | ICD-10-CM | POA: Insufficient documentation

## 2015-12-21 DIAGNOSIS — Z7984 Long term (current) use of oral hypoglycemic drugs: Secondary | ICD-10-CM | POA: Insufficient documentation

## 2015-12-21 DIAGNOSIS — G4733 Obstructive sleep apnea (adult) (pediatric): Secondary | ICD-10-CM | POA: Insufficient documentation

## 2015-12-21 DIAGNOSIS — K529 Noninfective gastroenteritis and colitis, unspecified: Secondary | ICD-10-CM | POA: Insufficient documentation

## 2015-12-21 DIAGNOSIS — I2510 Atherosclerotic heart disease of native coronary artery without angina pectoris: Secondary | ICD-10-CM | POA: Insufficient documentation

## 2015-12-21 DIAGNOSIS — Z79899 Other long term (current) drug therapy: Secondary | ICD-10-CM | POA: Diagnosis not present

## 2015-12-21 DIAGNOSIS — I5032 Chronic diastolic (congestive) heart failure: Secondary | ICD-10-CM | POA: Insufficient documentation

## 2015-12-21 DIAGNOSIS — Z794 Long term (current) use of insulin: Secondary | ICD-10-CM | POA: Diagnosis not present

## 2015-12-21 DIAGNOSIS — R197 Diarrhea, unspecified: Secondary | ICD-10-CM | POA: Diagnosis not present

## 2015-12-21 DIAGNOSIS — I110 Hypertensive heart disease with heart failure: Secondary | ICD-10-CM | POA: Diagnosis not present

## 2015-12-21 DIAGNOSIS — R69 Illness, unspecified: Secondary | ICD-10-CM | POA: Diagnosis not present

## 2015-12-21 DIAGNOSIS — F329 Major depressive disorder, single episode, unspecified: Secondary | ICD-10-CM | POA: Diagnosis not present

## 2015-12-21 HISTORY — PX: COLONOSCOPY WITH PROPOFOL: SHX5780

## 2015-12-21 LAB — GLUCOSE, CAPILLARY: GLUCOSE-CAPILLARY: 147 mg/dL — AB (ref 65–99)

## 2015-12-21 SURGERY — COLONOSCOPY WITH PROPOFOL
Anesthesia: Monitor Anesthesia Care

## 2015-12-21 MED ORDER — SODIUM CHLORIDE 0.9 % IV SOLN: INTRAVENOUS | Status: DC

## 2015-12-21 MED ORDER — PROPOFOL 10 MG/ML IV BOLUS
INTRAVENOUS | Status: AC
  Filled 2015-12-21: qty 20

## 2015-12-21 MED ORDER — LACTATED RINGERS IV SOLN
INTRAVENOUS | Status: DC
  Administered 2015-12-21: 08:00:00 via INTRAVENOUS

## 2015-12-21 MED ORDER — PROPOFOL 10 MG/ML IV BOLUS
INTRAVENOUS | Status: DC | PRN
  Administered 2015-12-21: 50 mg via INTRAVENOUS
  Administered 2015-12-21: 100 mg via INTRAVENOUS
  Administered 2015-12-21: 30 mg via INTRAVENOUS
  Administered 2015-12-21: 50 mg via INTRAVENOUS

## 2015-12-21 SURGICAL SUPPLY — 21 items

## 2015-12-21 NOTE — Anesthesia Postprocedure Evaluation (Signed)
Anesthesia Post Note  Patient: [name]  Procedure(s) Performed: Procedure(s) (LRB): COLONOSCOPY WITH PROPOFOL (N/A)  Patient location during evaluation: PACU Anesthesia Type: MAC Level of consciousness: awake and alert Pain management: pain level controlled Vital Signs Assessment: post-procedure vital signs reviewed and stable Respiratory status: spontaneous breathing, nonlabored ventilation and respiratory function stable Cardiovascular status: stable and blood pressure returned to baseline Anesthetic complications: no    Last Vitals:  Vitals:   12/21/15 0810 12/21/15 0856  BP: (!) 119/43 (!) 89/46  Pulse: 69 72  Resp: 17 15  Temp: 36.4 C 36.5 C    Last Pain:  Vitals:   12/21/15 0856  TempSrc: Oral  PainSc:                  Nilda Simmer

## 2015-12-21 NOTE — Op Note (Signed)
[facility] Patient Name: [name] Procedure Date: 12/21/2015 MRN: [id number] Attending MD: [name] , MD Date of Birth: 1950-12-24 CSN: [id number] Age: 65 Admit Type: Outpatient Procedure:                Colonoscopy Indications:              Clinically significant diarrhea of unexplained                            origin Providers:                [name], MD, Elmer Ramp. Tilden Dome, RN, William Dalton, Technician Referring MD:              Medicines:                Propofol per Anesthesia, Monitored Anesthesia Care Complications:            No immediate complications. Estimated Blood Loss:     Estimated blood loss was minimal. Procedure:                Pre-Anesthesia Assessment:                           - Prior to the procedure, a History and Physical                            was performed, and patient medications and                            allergies were reviewed. The patient's tolerance of                            previous anesthesia was also reviewed. The risks                            and benefits of the procedure and the sedation                            options and risks were discussed with the patient.                            All questions were answered, and informed consent                            was obtained. Prior Anticoagulants: The patient                            last took aspirin 1 day prior to the procedure. ASA                            Grade Assessment: III - A patient with severe  systemic disease. After reviewing the risks and                            benefits, the patient was deemed in satisfactory                            condition to undergo the procedure.                           After obtaining informed consent, the colonoscope                            was passed under direct vision. Throughout the                            procedure, the  patient's blood pressure, pulse, and                            oxygen saturations were monitored continuously. The                            [id number]) scope was introduced through                            the anus and advanced to the the terminal ileum,                            with identification of the appendiceal orifice and                            IC valve. The colonoscopy was performed without                            difficulty. The patient tolerated the procedure                            well. The quality of the bowel preparation was                            good. The bowel preparation used was Prepopik. The                            terminal ileum, ileocecal valve, appendiceal                            orifice, and rectum were photographed. Findings:      The perianal exam findings include a perianal rash.      The digital rectal exam was normal.      The terminal ileum appeared normal.      A 2 mm polyp was found in the rectum. The polyp was sessile. The polyp       was removed with a cold biopsy forceps. Resection and retrieval were       complete. Verification of patient identification for the specimen was  done. Estimated blood loss was minimal.      The exam was otherwise without abnormality on direct and retroflexion       views.      Biopsies for histology were taken with a cold forceps from the entire       colon for evaluation of microscopic colitis. Estimated blood loss was       minimal. Verification of patient identification for the specimen was       done. Impression:               - Perianal rash found on perianal exam.                           - The examined portion of the ileum was normal.                           - One 2 mm polyp in the rectum, removed with a cold                            biopsy forceps. Resected and retrieved.                           - The examination was otherwise normal on direct                             and retroflexion views.                           - Biopsies were taken with a cold forceps from the                            entire colon for evaluation of microscopic colitis. Moderate Sedation:      Please see anesthesia notes, moderate sedation not given Recommendation:           - Patient has a contact number available for                            emergencies. The signs and symptoms of potential                            delayed complications were discussed with the                            patient. Return to normal activities tomorrow.                            Written discharge instructions were provided to the                            patient.                           - Resume previous diet.                           - Continue present  medications.                           - Repeat colonoscopy is recommended. The                            colonoscopy date will be determined after pathology                            results from today's exam become available for                            review. Procedure Code(s):        --- Professional ---                           [id number], Colonoscopy, flexible; with biopsy, single                            or multiple Diagnosis Code(s):        --- Professional ---                           R21, Rash and other nonspecific skin eruption                           K62.1, Rectal polyp                           R19.7, Diarrhea, unspecified CPT copyright 2016 [employer] Medical Association. All rights reserved. The codes documented in this report are preliminary and upon coder review may  be revised to meet current compliance requirements. [name], MD 12/21/2015 9:02:29 AM This report has been signed electronically. Number of Addenda: 0

## 2015-12-21 NOTE — Interval H&P Note (Signed)
History and Physical Interval Note:  12/21/2015 8:11 AM  [name]  has presented today for surgery, with the diagnosis of diarrhea  The various methods of treatment have been discussed with the patient and family. After consideration of risks, benefits and other options for treatment, the patient has consented to  Procedure(s): COLONOSCOPY WITH PROPOFOL (N/A) as a surgical intervention .  The patient's history has been reviewed, patient examined, no change in status, stable for surgery.  I have reviewed the patient's chart and labs.  Questions were answered to the patient's satisfaction.     Silvano Rusk

## 2015-12-21 NOTE — Discharge Instructions (Signed)
° °  I removed a tiny rectal polyp - unrelated to symptoms and unlikely to be a problem - it is removed regardless. The remainder of the exam normal but I took biopsies of colon lining to check for microscopic inflammation that could be related to diarrhea.  Will contact you about results and recommendations by next week.  I appreciate the opportunity to care for you. [name], MD, FACG  YOU HAD AN ENDOSCOPIC PROCEDURE TODAY: Refer to the procedure report and other information in the discharge instructions given to you for any specific questions about what was found during the examination. If this information does not answer your questions, please call Dr. Celesta Aver office at [phone] to clarify.   YOU SHOULD EXPECT: Some feelings of bloating in the abdomen. Passage of more gas than usual. Walking can help get rid of the air that was put into your GI tract during the procedure and reduce the bloating. If you had a lower endoscopy (such as a colonoscopy or flexible sigmoidoscopy) you may notice spotting of blood in your stool or on the toilet paper. Some abdominal soreness may be present for a day or two, also.  DIET: Your first meal following the procedure should be a light meal and then it is ok to progress to your normal diet. A half-sandwich or bowl of soup is an example of a good first meal. Heavy or fried foods are harder to digest and may make you feel nauseous or bloated. Drink plenty of fluids but you should avoid alcoholic beverages for 24 hours.   ACTIVITY: Your care partner should take you home directly after the procedure. You should plan to take it easy, moving slowly for the rest of the day. You can resume normal activity the day after the procedure however YOU SHOULD NOT DRIVE, use power tools, machinery or perform tasks that involve climbing or major physical exertion for 24 hours (because of the sedation medicines used during the test).   SYMPTOMS TO REPORT  IMMEDIATELY: A gastroenterologist can be reached at any hour. Please call [phone]  for any of the following symptoms:  Following lower endoscopy (colonoscopy, flexible sigmoidoscopy) Excessive amounts of blood in the stool  Significant tenderness, worsening of abdominal pains  Swelling of the abdomen that is new, acute  Fever of 100 or higher    FOLLOW UP:  If any biopsies were taken you will be contacted by phone or by letter within the next 1-3 weeks. Call [phone]  if you have not heard about the biopsies in 3 weeks.  Please also call with any specific questions about appointments or follow up tests.

## 2015-12-21 NOTE — Transfer of Care (Signed)
Immediate Anesthesia Transfer of Care Note  Patient: [name]  Procedure(s) Performed: Procedure(s): COLONOSCOPY WITH PROPOFOL (N/A)  Patient Location: PACU  Anesthesia Type:MAC  Level of Consciousness: awake, alert  and oriented  Airway & Oxygen Therapy: Patient Spontanous Breathing and Patient connected to nasal cannula oxygen  Post-op Assessment: Report given to RN and Post -op Vital signs reviewed and stable  Post vital signs: Reviewed and stable  Last Vitals:  Vitals:   12/21/15 0810  BP: (!) 119/43  Pulse: 69  Resp: 17  Temp: 36.4 C    Last Pain:  Vitals:   12/21/15 0810  TempSrc: Oral  PainSc: 4          Complications: No apparent anesthesia complications

## 2015-12-21 NOTE — H&P (View-Only) (Signed)
Subjective:    Patient ID: [name], female    DOB: 1950-12-12, 65 y.o.   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 Augmentin [Amoxicillin-Pot Clavulanate] Diarrhea and Other (See Comments)    Watery diarrhea and abd pain Has patient had a PCN reaction causing immediate rash, facial/tongue/throat swelling, SOB or lightheadedness with hypotension: no Has patient had a PCN reaction causing severe rash involving mucus membranes or skin necrosis: no Has patient had a PCN reaction that required hospitalization no Has patient had a PCN reaction occurring within the last 10 years: yes If all of the above answers are "NO", then may proceed with Cephal  . Erythromycin Base Nausea And Vomiting  . Neomycin Nausea And Vomiting   Outpatient Prescriptions Prior to Visit  Medication Sig Dispense Refill  . ALPRAZolam (XANAX) 0.5 MG tablet Take 0.5 mg by mouth at bedtime.  2  . aspirin EC 81 MG tablet Take 81 mg by mouth daily.    Marland Kitchen atorvastatin (LIPITOR) 40 MG tablet TAKE 1 TABLET (40 MG TOTAL) BY MOUTH AT BEDTIME. (Patient taking differently: Take 40 mg by mouth at bedtime. ) 90 tablet 2  . cyanocobalamin (,VITAMIN B-12,) 1000 MCG/ML injection Inject 1 mL (1,000 mcg total) into the muscle every 30 (thirty) days. 10 mL 3  . fluconazole (DIFLUCAN) 150 MG tablet Take 1 tablet (150 mg total) by mouth once a week. 4 tablet 0  . furosemide (LASIX) 40 MG tablet TAKE 1 TABLET BY MOUTH EVERY DAY 30 tablet 6  . gabapentin (NEURONTIN) 400 MG capsule Take 400-1,200 mg by mouth 3 (three) times daily. Take one capsule in the morning, one capsule 7 hours later and then three capsules at  bedtime.  0  . ibuprofen (ADVIL,MOTRIN) 200 MG tablet Take 600 mg by mouth every 6 (six) hours as needed (For pain.).    Marland Kitchen lamoTRIgine (LAMICTAL) 200 MG tablet Take 200 mg by mouth daily.    Marland Kitchen levothyroxine (SYNTHROID, LEVOTHROID) 112 MCG tablet TAKE 2 TABLETS (224 MCG TOTAL) BY MOUTH DAILY BEFORE BREAKFAST (Patient taking differently: Take 224 mcg by mouth daily before breakfast. ) 60 tablet 6  . metFORMIN (GLUCOPHAGE) 850 MG tablet Take 1 tablet (850 mg total) by mouth 2 (two) times daily with a meal. 180 tablet 1  . nystatin ointment (MYCOSTATIN) Apply 1 application topically daily as needed. Apply to red areas in groin 30 g 1  . omeprazole (PRILOSEC) 40 MG capsule TAKE 1 CAPSULE (40 MG TOTAL) BY MOUTH DAILY. (Patient taking differently: Take 40 mg by mouth daily. ) 30 capsule 6  . potassium chloride (KLOR-CON 10) 10 MEQ tablet TAKE 2 TABLETS (20 MEQ TOTAL) BY MOUTH DAILY. (Patient taking differently: Take 20 mEq by mouth daily. ) 180 tablet 3  . ramipril (ALTACE) 10 MG capsule TAKE 1 CAPSULE (10 MG TOTAL) BY MOUTH DAILY. 90 capsule 1  . rOPINIRole (REQUIP) 3 MG tablet TAKE 1 TABLET(3 MG) BY MOUTH AT BEDTIME 90 tablet 0  . triamcinolone cream (KENALOG) 0.1 % APPLY TO AFFECTED AREA ON EXTERNAL EAR TWICE DAILY. DO NOT USE FOR MORE THAN 2 WEEKS AT A TIME (  Patient taking differently: APPLY TO AFFECTED AREA DAILY AS NEEDED FOR YEAST INFECTION.) 30 g 0  . Vilazodone HCl (VIIBRYD) 40 MG TABS Take 40 mg by mouth every morning.    . diclofenac (VOLTAREN) 75 MG EC tablet TAKE 1 TABLET(75 MG) BY MOUTH TWICE DAILY 30 tablet 0  . HYDROcodone-acetaminophen (NORCO/VICODIN) 5-325 MG tablet Take 1 tablet by mouth 2 (two) times daily as needed for moderate pain. 30 tablet 0  . naproxen (NAPROSYN) 500 MG tablet Take 500 mg by mouth daily as needed (For pain.).     Facility-Administered Medications Prior to Visit  Medication Dose Route Frequency Provider Last Rate Last Dose  . triamcinolone acetonide (KENALOG) 10  MG/ML injection 10 mg  10 mg Other Once Landis Martins, DPM       Past Medical History  Diagnosis Date  . Vitamin B 12 deficiency     IF negative  . Anxiety   . Depression   . Hyperlipidemia   . Hypothyroidism   . CAD (coronary artery disease)     nonobstructive plaque by cath 2013  . Morbid obesity ([place])     seen dietician 09/2011  . T2DM (type 2 diabetes mellitus) ([place])   . Vitamin D deficiency     last check 48 (11/2010)  . Fibula fracture   . Fatty liver 2006    [place]  . 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 Chronic diastolic CHF (congestive heart failure) ([place]) 08/2011    EF 55-60%, nl valves  . RLS (restless legs syndrome)   . Arthritis   . GERD (gastroesophageal reflux disease)   . Hypertension   . Complication of anesthesia     hard time waking up 02 sats 85-89% range   . OSA (obstructive sleep apnea)     medium quattro full face mask CPAP 10cm  . 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 Hypersomnia with sleep apnea, unspecified 01/08/2014  . Osteoarthritis of left knee 2016    nontraumatic loose body with locking, primary localized OA [name])   Past Surgical History  Procedure Laterality Date  . Osteotomy and ulnar shortening  2001    [name]  . Knee surgery Left 11-02-2008    Partial medial and lateral Menisectomies and Chondroplasty (Dr. [name])  . Cardiac catheterization  10/12/09    EF 70%  . Mr abdomen  01/16/2011    Left 4cm adrenal myelolipoma  . Cardiac catheterization  08/23/2011    Mild nonobstructive CAD, normal LV fxn  . US echocardiography  08/2011    EF of 55-60% and indeterminate diastolic function  . Esophagogastroduodenoscopy  2006  . Ventral hernia repair  03/01/2012    Procedure: LAPAROSCOPIC VENTRAL HERNIA;  Surgeon: Ralene Ok, MD;  Location: [facility] ORS;  Service: General;  Laterality: N/A;  . Left heart catheterization with coronary angiogram N/A 08/23/2011    Procedure: LEFT HEART CATHETERIZATION WITH CORONARY  ANGIOGRAM;  Surgeon: Sherren Mocha, MD;  Location: [facility] CATH LAB;  Service: Cardiovascular;  Laterality: N/A;   Social History   Social History  . Marital Status: Divorced    Spouse Name: N/A  . Number of Children: 1  . Years of Education: 13   Occupational History  . Retired   .     Social History Main Topics  . Smoking status: Former Smoker    Quit date: 08/08/1997  . Smokeless tobacco: Never Used  . Alcohol Use: No  . Drug Use:  No  . Sexual Activity: No   Other Topics Concern  . None   Social History Narrative   Patient is divorced and lives with her daughter.   Patient has one adult child.   Patient is retired.   Patient has one year of college.   Patient is right-handed.   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   Diet: watching diet, low sodium. Good water.  Fruits/vegetables daily.      Adenosine Myoview nml 08/20/07   HOSP CP R/O'd Nonobstruct CAD by Cath EF 60% 5/27-10/13/2009   CATH and ECHO Nonobstr CAD  EF 60% 10/12/2009         Family History  Problem Relation Age of Onset  . Coronary artery disease Mother 79    14 angioplasty and 5v CABG  . Stroke Mother   . Diabetes Mother   . Heart attack Father 84  . Heart attack Brother 4    deceased age 27  . Colon cancer Neg Hx   . Colon polyps Neg Hx   . Esophageal cancer Neg Hx   . Kidney disease Neg Hx    Review of Systems As above - recovered from fall to chest      Objective:   Physical Exam BP 106/60 mmHg  Pulse 76  Ht 5' 5.5" (1.664 m)  Wt 328 lb (148.78 kg)  BMI 53.73 kg/m2 NAD Lungs cta abd obese nt Cor RRR s1s2 Appropriate mood and affect     Assessment & Plan:   Encounter Diagnoses  Name Primary?  . Diarrhea, unspecified type Yes  . Fecal incontinence    Colonoscopy at hospital (BMI > 50) The risks and benefits as well as alternatives of endoscopic procedure(s) have been discussed and reviewed. All questions  answered. The patient agrees to proceed.  Prep o pik prep  Ondansetron prn ac  I appreciate the opportunity to care for this patient. [id number] Danise Mina, MD

## 2015-12-22 ENCOUNTER — Encounter (HOSPITAL_COMMUNITY): Payer: Self-pay | Admitting: Internal Medicine

## 2015-12-23 ENCOUNTER — Encounter: Payer: Self-pay | Admitting: Family Medicine

## 2015-12-24 ENCOUNTER — Telehealth: Payer: Self-pay | Admitting: Internal Medicine

## 2015-12-24 MED ORDER — BUDESONIDE 3 MG PO CPEP: 9.0000 mg | ORAL_CAPSULE | Freq: Every day | ORAL | 3 refills | Status: DC

## 2015-12-24 NOTE — Progress Notes (Signed)
The biopsies suggest she has colitis - inflammation I recommend entocort EC 9 mg daily # 90 3 mg capsules and 2 refills Stay on that until she sees me in 8 weeks (make an appt please) If this Rx too expensive call us back  Colon polyp not precancerous - 10 yr recall

## 2015-12-24 NOTE — Telephone Encounter (Signed)
The pt was notified of the pathology and the Entocort was sent to her pharmacy, she will call if the medication is too expensive.  Recall was also added to [facility] for 10 year colon.

## 2015-12-24 NOTE — Telephone Encounter (Signed)
The pt has continued to have diarrhea, fecal incontinence.  The pathology has not been reviewed by Dr Carlean Purl.  I advised her to try imodium for the diarrhea and I will send to Dr Carlean Purl for recommendations regarding pathology.  Dr Carlean Purl please advise

## 2015-12-24 NOTE — Telephone Encounter (Signed)
[name], MD  Marlon Pel, RN        The biopsies suggest she has colitis - inflammation  I recommend entocort EC 9 mg daily # 90 3 mg capsules and 2 refills  Stay on that until she sees me in 8 weeks (make an appt please)  If this Rx too expensive call us back   Colon polyp not precancerous - 10 yr recall

## 2015-12-28 ENCOUNTER — Ambulatory Visit: Payer: Medicare HMO | Attending: Pulmonary Disease

## 2015-12-28 DIAGNOSIS — G4733 Obstructive sleep apnea (adult) (pediatric): Secondary | ICD-10-CM | POA: Diagnosis not present

## 2015-12-28 DIAGNOSIS — E119 Type 2 diabetes mellitus without complications: Secondary | ICD-10-CM | POA: Diagnosis not present

## 2015-12-28 DIAGNOSIS — R079 Chest pain, unspecified: Secondary | ICD-10-CM | POA: Insufficient documentation

## 2015-12-28 DIAGNOSIS — G4719 Other hypersomnia: Secondary | ICD-10-CM | POA: Diagnosis present

## 2015-12-28 DIAGNOSIS — I2510 Atherosclerotic heart disease of native coronary artery without angina pectoris: Secondary | ICD-10-CM | POA: Diagnosis not present

## 2015-12-28 DIAGNOSIS — I5032 Chronic diastolic (congestive) heart failure: Secondary | ICD-10-CM | POA: Diagnosis not present

## 2015-12-31 DIAGNOSIS — G4733 Obstructive sleep apnea (adult) (pediatric): Secondary | ICD-10-CM | POA: Diagnosis not present

## 2015-12-31 DIAGNOSIS — G4730 Sleep apnea, unspecified: Secondary | ICD-10-CM | POA: Diagnosis not present

## 2016-01-05 ENCOUNTER — Telehealth: Payer: Self-pay | Admitting: *Deleted

## 2016-01-05 DIAGNOSIS — G4733 Obstructive sleep apnea (adult) (pediatric): Secondary | ICD-10-CM

## 2016-01-05 NOTE — Telephone Encounter (Signed)
Pt informed of sleep study results. Will place order for CPAP. Nothing further needed.

## 2016-01-10 ENCOUNTER — Other Ambulatory Visit: Payer: Self-pay | Admitting: Family Medicine

## 2016-01-10 NOTE — Telephone Encounter (Signed)
Last refill 08/25/15- 30 G Last office visit 12/17/15 Okay to refill?

## 2016-01-18 ENCOUNTER — Ambulatory Visit (INDEPENDENT_AMBULATORY_CARE_PROVIDER_SITE_OTHER): Payer: Medicare HMO | Admitting: Family Medicine

## 2016-01-18 DIAGNOSIS — Z23 Encounter for immunization: Secondary | ICD-10-CM

## 2016-01-18 NOTE — Progress Notes (Signed)
Flu shot

## 2016-01-24 ENCOUNTER — Other Ambulatory Visit: Payer: Self-pay | Admitting: Family Medicine

## 2016-01-24 NOTE — Telephone Encounter (Signed)
Ok to refill? Last filled 12/12/15 #90 0RF

## 2016-01-25 ENCOUNTER — Telehealth: Payer: Self-pay | Admitting: Internal Medicine

## 2016-01-25 ENCOUNTER — Other Ambulatory Visit: Payer: Self-pay | Admitting: Family Medicine

## 2016-01-25 NOTE — Telephone Encounter (Signed)
Pt calling asking about her sleep study she did a few weeks ago Would like those results Please call back

## 2016-01-25 NOTE — Telephone Encounter (Signed)
Pt hasn't heard from [facility] about her CPAP machine. Order was placed 01/05/16. Please call [facility]. Thanks

## 2016-01-25 NOTE — Telephone Encounter (Signed)
Order was sent to [facility] on 01/05/16. Called and spoke with Jiles Crocker at [facility] to check on status of order.  Apparently [facility] had an old phone number that they were trying to reach patient on.  Corene Cornea sent message to [facility] Dept as high priority to contact patient on her current phone number.   Called and spoke with patient and advised her that [facility] should reach out to her today or tomorrow to arrange set up of CPAP device.  Advised patient that if she didn't hear anything from [facility] to please let me know by Friday. Pt voiced understanding and assured me that she would let me know if she didn't hear anything. Nothing else needed at this time. Rhonda J Cobb

## 2016-01-26 DIAGNOSIS — G4733 Obstructive sleep apnea (adult) (pediatric): Secondary | ICD-10-CM | POA: Diagnosis not present

## 2016-01-26 DIAGNOSIS — S82409A Unspecified fracture of shaft of unspecified fibula, initial encounter for closed fracture: Secondary | ICD-10-CM | POA: Diagnosis not present

## 2016-02-09 ENCOUNTER — Other Ambulatory Visit: Payer: Self-pay | Admitting: Family Medicine

## 2016-02-17 ENCOUNTER — Encounter: Payer: Self-pay | Admitting: Family Medicine

## 2016-02-25 DIAGNOSIS — G4733 Obstructive sleep apnea (adult) (pediatric): Secondary | ICD-10-CM | POA: Diagnosis not present

## 2016-02-25 DIAGNOSIS — S82409A Unspecified fracture of shaft of unspecified fibula, initial encounter for closed fracture: Secondary | ICD-10-CM | POA: Diagnosis not present

## 2016-03-03 ENCOUNTER — Other Ambulatory Visit: Payer: Self-pay | Admitting: Family Medicine

## 2016-03-07 ENCOUNTER — Ambulatory Visit: Payer: Medicare HMO | Admitting: Internal Medicine

## 2016-03-10 ENCOUNTER — Other Ambulatory Visit: Payer: Self-pay | Admitting: Family Medicine

## 2016-03-15 ENCOUNTER — Ambulatory Visit: Admission: RE | Admit: 2016-03-15 | Payer: Medicare HMO | Source: Ambulatory Visit

## 2016-03-27 ENCOUNTER — Ambulatory Visit: Payer: Medicare HMO | Admitting: Internal Medicine

## 2016-03-27 DIAGNOSIS — G4733 Obstructive sleep apnea (adult) (pediatric): Secondary | ICD-10-CM | POA: Diagnosis not present

## 2016-03-27 DIAGNOSIS — S82409A Unspecified fracture of shaft of unspecified fibula, initial encounter for closed fracture: Secondary | ICD-10-CM | POA: Diagnosis not present

## 2016-03-30 ENCOUNTER — Ambulatory Visit: Payer: Medicare HMO | Admitting: Cardiovascular Disease

## 2016-03-30 ENCOUNTER — Encounter: Payer: Self-pay | Admitting: *Deleted

## 2016-04-08 DIAGNOSIS — J069 Acute upper respiratory infection, unspecified: Secondary | ICD-10-CM | POA: Diagnosis not present

## 2016-04-12 ENCOUNTER — Other Ambulatory Visit: Payer: Self-pay | Admitting: Family Medicine

## 2016-04-12 ENCOUNTER — Encounter: Payer: Self-pay | Admitting: Family Medicine

## 2016-04-12 MED ORDER — ROPINIROLE HCL 3 MG PO TABS: ORAL_TABLET | ORAL | 0 refills | Status: DC

## 2016-04-12 NOTE — Telephone Encounter (Signed)
Ok to refill? Last filled 01/24/16 #90 0RF

## 2016-04-19 ENCOUNTER — Encounter: Payer: Self-pay | Admitting: *Deleted

## 2016-04-19 ENCOUNTER — Ambulatory Visit: Payer: Medicare HMO | Admitting: Internal Medicine

## 2016-04-26 DIAGNOSIS — G4733 Obstructive sleep apnea (adult) (pediatric): Secondary | ICD-10-CM | POA: Diagnosis not present

## 2016-04-26 DIAGNOSIS — S82409A Unspecified fracture of shaft of unspecified fibula, initial encounter for closed fracture: Secondary | ICD-10-CM | POA: Diagnosis not present

## 2016-05-01 ENCOUNTER — Ambulatory Visit (INDEPENDENT_AMBULATORY_CARE_PROVIDER_SITE_OTHER): Payer: Medicare HMO | Admitting: Pulmonary Disease

## 2016-05-01 ENCOUNTER — Encounter: Payer: Self-pay | Admitting: Pulmonary Disease

## 2016-05-01 VITALS — BP 118/70 | HR 68 | Ht 65.5 in | Wt 333.0 lb

## 2016-05-01 DIAGNOSIS — G4733 Obstructive sleep apnea (adult) (pediatric): Secondary | ICD-10-CM | POA: Diagnosis not present

## 2016-05-01 DIAGNOSIS — G4710 Hypersomnia, unspecified: Secondary | ICD-10-CM

## 2016-05-01 NOTE — Patient Instructions (Signed)
I think that your excessive sleepiness is likely due to inadequately treated sleep apnea. This might be because of weight gain since the time of initial diagnosis of sleep apnea 6 years ago. In other words, you might need to have the settings on your machine adjusted  1) We discussed weight loss strategies. Most importantly, I want you to eliminate all refined sugars and grains from your diet. No snack food or processed foods. If it comes in a package, you probably should not be eating it  2) We have ordered a CPAP titration study to determine what setting on the CPAP is necessary   3) Follow up in 4-6 weeks with me

## 2016-05-01 NOTE — Progress Notes (Signed)
PULMONARY/SLEEP OFFICE FOLLOW UP  PROBLEMS: OSA Obesity  DATA: 01/04/16 Split night study: AHI 32/hr, lowest SpO2 67%, recommended setting 8 cm H2O with nasal pillows 11/18-12/17/17 CPAP compliance: Usage 27/30 night, >4 hrs 25 nights, <4 hrs 2 night  SUMMARY: 65 y.o. F with OSA first diagnosed in 2011 on CPAP since then. Significant weight gain of at least 50# since initial diagnosis. Persistent daytime hypersomnolence  INTERVAL HISTORY: No major event  SUBJ: Pt referred for re-eval and to review compliance report. She continues to suffer from excessive daytime sleepiness. A repeat Epworth scale reveals a score of 17. She is unsure of her CPAP setting and doesn't know if her machine is an Sales promotion account executive.  OBJ: Vitals:   05/01/16 0853  BP: 118/70  Pulse: 68  SpO2: 97%  Weight: (!) 333 lb (151 kg)  Height: 5' 5.5" (1.664 m)   Obese, NAD HEENT WNL, nares are widely patent, oropharynx is normal No JVD noted Chest clear to ausc and perc Reg, no M Obese, soft, +BS No C/C/E  BMP Latest Ref Rng & Units 08/16/2015 10/23/2014 09/29/2014  Glucose 70 - 99 mg/dL 164(H) 160(H) 200(H)  BUN 6 - 23 mg/dL 20 13 20   Creatinine 0.40 - 1.20 mg/dL 0.74 0.83 0.85  Sodium 135 - 145 mEq/L 142 142 139  Potassium 3.5 - 5.1 mEq/L 4.1 4.3 3.9  Chloride 96 - 112 mEq/L 103 107 105  CO2 19 - 32 mEq/L 29 30 27   Calcium 8.4 - 10.5 mg/dL 9.3 9.2 9.2   CBC Latest Ref Rng & Units 08/16/2015 10/23/2014 01/09/2014  WBC 4.0 - 10.5 K/uL 5.7 5.7 5.6  Hemoglobin 12.0 - 15.0 g/dL 13.5 13.8 14.6  Hematocrit 36.0 - 46.0 % 40.4 42.0 44.3  Platelets 150.0 - 400.0 K/uL 215.0 221.0 227.0   No recent CXR   DATA: Compliance report as above  IMPRESSION: 1) Moderate OSA with severe desaturation @ baseline 2) Persistent daytime hypersomnolence despite reasonably good CPAP compliance  Concern for inadequate pressure setting 3) Inexorable weight gain which she attributes to dietary indiscretion  PLAN: 1) Repeat CPAP  titration study ordered 2) we discussed weight loss strategies and I emphasized avoidance of processed foods, simple carbohydrates and refined sugar 3) Follow up in 4-6 weeks    Merton Border, MD PCCM service Mobile [phone] Pager [phone] 05/01/2016

## 2016-05-09 ENCOUNTER — Other Ambulatory Visit: Payer: Self-pay | Admitting: Cardiovascular Disease

## 2016-05-09 ENCOUNTER — Other Ambulatory Visit: Payer: Self-pay | Admitting: Family Medicine

## 2016-05-12 ENCOUNTER — Ambulatory Visit: Payer: Medicare HMO | Admitting: Internal Medicine

## 2016-05-27 DIAGNOSIS — S82409A Unspecified fracture of shaft of unspecified fibula, initial encounter for closed fracture: Secondary | ICD-10-CM | POA: Diagnosis not present

## 2016-05-27 DIAGNOSIS — G4733 Obstructive sleep apnea (adult) (pediatric): Secondary | ICD-10-CM | POA: Diagnosis not present

## 2016-06-02 ENCOUNTER — Ambulatory Visit (INDEPENDENT_AMBULATORY_CARE_PROVIDER_SITE_OTHER): Payer: Medicare HMO | Admitting: Cardiovascular Disease

## 2016-06-02 ENCOUNTER — Other Ambulatory Visit: Payer: Self-pay | Admitting: Cardiovascular Disease

## 2016-06-02 ENCOUNTER — Encounter: Payer: Self-pay | Admitting: Cardiovascular Disease

## 2016-06-02 ENCOUNTER — Telehealth: Payer: Self-pay | Admitting: Cardiovascular Disease

## 2016-06-02 VITALS — BP 130/84 | HR 64 | Ht 65.5 in | Wt 328.0 lb

## 2016-06-02 DIAGNOSIS — E1165 Type 2 diabetes mellitus with hyperglycemia: Secondary | ICD-10-CM

## 2016-06-02 DIAGNOSIS — I25118 Atherosclerotic heart disease of native coronary artery with other forms of angina pectoris: Secondary | ICD-10-CM | POA: Diagnosis not present

## 2016-06-02 DIAGNOSIS — Z9989 Dependence on other enabling machines and devices: Secondary | ICD-10-CM

## 2016-06-02 DIAGNOSIS — R0789 Other chest pain: Secondary | ICD-10-CM

## 2016-06-02 DIAGNOSIS — E1140 Type 2 diabetes mellitus with diabetic neuropathy, unspecified: Secondary | ICD-10-CM

## 2016-06-02 DIAGNOSIS — R079 Chest pain, unspecified: Secondary | ICD-10-CM

## 2016-06-02 DIAGNOSIS — I5032 Chronic diastolic (congestive) heart failure: Secondary | ICD-10-CM | POA: Diagnosis not present

## 2016-06-02 DIAGNOSIS — E782 Mixed hyperlipidemia: Secondary | ICD-10-CM | POA: Diagnosis not present

## 2016-06-02 DIAGNOSIS — G4733 Obstructive sleep apnea (adult) (pediatric): Secondary | ICD-10-CM

## 2016-06-02 DIAGNOSIS — IMO0002 Reserved for concepts with insufficient information to code with codable children: Secondary | ICD-10-CM

## 2016-06-02 MED ORDER — NITROGLYCERIN 0.4 MG SL SUBL: 0.4000 mg | SUBLINGUAL_TABLET | SUBLINGUAL | 3 refills | Status: DC | PRN

## 2016-06-02 NOTE — Patient Instructions (Addendum)
Medication Instructions:    Please take soda, tums, pepcid, for pain NTG as needed CAll the office if you have more episodes  Labwork:  No new labs needed  Testing/Procedures:  We will order a lexiscan myoview for chest pain, known CAD [place] caregiver has ordered a Stress Test with nuclear imaging. The purpose of this test is to evaluate the blood supply to your heart muscle. This procedure is referred to as a "Non-Invasive Stress Test." This is because other than having an IV started in your vein, nothing is inserted or "invades" your body. Cardiac stress tests are done to find areas of poor blood flow to the heart by determining the extent of coronary artery disease (CAD). Some patients exercise on a treadmill, which naturally increases the blood flow to your heart, while others who are  unable to walk on a treadmill due to physical limitations have a pharmacologic/chemical stress agent called Lexiscan . This medicine will mimic walking on a treadmill by temporarily increasing your coronary blood flow.   Please note: these test may take anywhere between 2-4 hours to complete  PLEASE REPORT TO [facility] AT THE FIRST DESK WILL DIRECT YOU WHERE TO GO  Date of Procedure:____Wed, January 31 & Thurs, Feb 1___  Arrival Time for Procedure:____8:45 am___  How to prepare for your Myoview test:  1. Do not eat or drink after midnight 2. No caffeine for 24 hours prior to test 3. No smoking 24 hours prior to test. 4. Your medication may be taken with water. Ladies, please do not wear dresses.  Skirts or pants are appropriate. Please wear a short sleeve shirt. 5. No perfume, cologne or lotion.  Research CT coronary calcium score   I recommend watching educational videos on topics of interest to you at:       [email or web address]  Enter code: HEARTCARE    Follow-Up: It was a pleasure seeing you in the office today. Please call us if you have new  issues that need to be addressed before your next appt.  [phone]  Your physician wants you to follow-up in: 6 months.  You will receive a reminder letter in the mail two months in advance. If you don't receive a letter, please call our office to schedule the follow-up appointment.  If you need a refill on your cardiac medications before your next appointment, please call your pharmacy.    Pharmacologic Stress Electrocardiogram A pharmacologic stress electrocardiogram is a heart (cardiac) test that uses nuclear imaging to evaluate the blood supply to your heart. This test may also be called a pharmacologic stress electrocardiography. Pharmacologic means that a medicine is used to increase your heart rate and blood pressure.  This stress test is done to find areas of poor blood flow to the heart by determining the extent of coronary artery disease (CAD). Some people exercise on a treadmill, which naturally increases the blood flow to the heart. For those people unable to exercise on a treadmill, a medicine is used. This medicine stimulates your heart and will cause your heart to beat harder and more quickly, as if you were exercising.  Pharmacologic stress tests can help determine:  The adequacy of blood flow to your heart during increased levels of activity in order to clear you for discharge home.  The extent of coronary artery blockage caused by CAD.  Your prognosis if you have suffered a heart attack.  The effectiveness of cardiac procedures done, such  as an angioplasty, which can increase the circulation in your coronary arteries.  Causes of chest pain or pressure. LET [facility] CARE PROVIDER KNOW ABOUT:  Any allergies you have.  All medicines you are taking, including vitamins, herbs, eye drops, creams, and over-the-counter medicines.  Previous problems you or members of your family have had with the use of anesthetics.  Any blood disorders you have.  Previous surgeries  you have had.  Medical conditions you have.  Possibility of pregnancy, if this applies.  If you are currently breastfeeding. RISKS AND COMPLICATIONS Generally, this is a safe procedure. However, as with any procedure, complications can occur. Possible complications include:  You develop pain or pressure in the following areas:  Chest.  Jaw or neck.  Between your shoulder blades.  Radiating down your left arm.  Headache.  Dizziness or light-headedness.  Shortness of breath.  Increased or irregular heartbeat.  Low blood pressure.  Nausea or vomiting.  Flushing.  Redness going up the arm and slight pain during injection of medicine.  Heart attack (rare). BEFORE THE PROCEDURE   Avoid all forms of caffeine for 24 hours before your test or as directed by your health care provider. This includes coffee, tea (even decaffeinated tea), caffeinated sodas, chocolate, cocoa, and certain pain medicines.  Follow your health care provider's instructions regarding eating and drinking before the test.  Take your medicines as directed at regular times with water unless instructed otherwise. Exceptions may include:  If you have diabetes, ask how you are to take your insulin or pills. It is common to adjust insulin dosing the morning of the test.  If you are taking beta-blocker medicines, it is important to talk to your health care provider about these medicines well before the date of your test. Taking beta-blocker medicines may interfere with the test. In some cases, these medicines need to be changed or stopped 24 hours or more before the test.  If you wear a nitroglycerin patch, it may need to be removed prior to the test. Ask your health care provider if the patch should be removed before the test.  If you use an inhaler for any breathing condition, bring it with you to the test.  If you are an outpatient, bring a snack so you can eat right after the stress phase of the  test.  Do not smoke for 4 hours prior to the test or as directed by your health care provider.  Do not apply lotions, powders, creams, or oils on your chest prior to the test.  Wear comfortable shoes and clothing. Let your health care provider know if you were unable to complete or follow the preparations for your test. PROCEDURE   Multiple patches (electrodes) will be put on your chest. If needed, small areas of your chest may be shaved to get better contact with the electrodes. Once the electrodes are attached to your body, multiple wires will be attached to the electrodes, and your heart rate will be monitored.  An IV access will be started. A nuclear trace (isotope) is given. The isotope may be given intravenously, or it may be swallowed. Nuclear refers to several types of radioactive isotopes, and the nuclear isotope lights up the arteries so that the nuclear images are clear. The isotope is absorbed by your body. This results in low radiation exposure.  A resting nuclear image is taken to show how your heart functions at rest.  A medicine is given through the IV access.  A  second scan is done about 1 hour after the medicine injection and determines how your heart functions under stress.  During this stress phase, you will be connected to an electrocardiogram machine. Your blood pressure and oxygen levels will be monitored. AFTER THE PROCEDURE   Your heart rate and blood pressure will be monitored after the test.  You may return to your normal schedule, including diet,activities, and medicines, unless your health care provider tells you otherwise. This information is not intended to replace advice given to you by your health care provider. Make sure you discuss any questions you have with your health care provider. Document Released: 09/17/2008 Document Revised: 05/06/2013 Document Reviewed: 01/06/2013 Elsevier Interactive Patient Education  2017 [employer].

## 2016-06-02 NOTE — Progress Notes (Signed)
Cardiology Office Note  Date:  06/02/2016   ID:  [name], [name] Dec 31, 1950, MRN [id number]  PCP:  Ria Bush, MD   Chief Complaint  Patient presents with  . other     Early f/u due to chest tightness. Pt states she woke up this morning with severe pain in chest, as well as some pain in left jaw. Pt took five 81mg  aspirin which eased the pain. Pt also states shes had a headache for 4 days. Reviewed meds with pt verbally.    HPI:  [name] is a very pleasant 66 year old woman with history of obesity, hyperlipidemia, hypothyroidism, diabetes who presents for routine followup of her hyperlipidemia, diabetes as well as chest pain  Long smoking history for 30 years, stopped at age 70. She has sleep apnea, wears CPAP,  restless leg syndrome  In follow-up today she reports having Pain across her back yesterday This Am, CP x2 woke her, lasting  15 min  Took aspirin5  Symptoms resolved but she had continued very mild chest Pressure for a few hours  Previous cath   results reviewed with her from 2014 showing : 30 to 50% ostial diagonal 07/2012. 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  EKG on today's visit shows normal sinus rhythm with rate 64 bpm, no significant ST or T-wave changes  Other past medical history  evaluated in the hospital July 2014 for chest pain. Cardiac enzymes negative, stress test also showed no ischemia. Previous episode of diastolic CHF. Prior echocardiogram in 2013 showing normal ejection fraction.   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  PSH:    Past Surgical History:  Procedure Laterality Date  . CARDIAC CATHETERIZATION  10/12/09   EF 70%  . CARDIAC CATHETERIZATION  08/23/2011   Mild nonobstructive CAD, normal LV fxn  . COLONOSCOPY WITH PROPOFOL N/A 12/21/2015   HP, focal chronic active colitis [name], MD)  . ESOPHAGOGASTRODUODENOSCOPY  2006  . KNEE SURGERY Left 11-02-2008   Partial medial and lateral Menisectomies and Chondroplasty (Dr. [name])  . LEFT HEART CATHETERIZATION WITH CORONARY ANGIOGRAM N/A 08/23/2011   Procedure: LEFT HEART CATHETERIZATION WITH CORONARY ANGIOGRAM;  Surgeon: Sherren Mocha, MD;  [facility] CATH LAB;  Service: Cardiovascular;  Laterality: N/A;  . MR ABDOMEN  01/16/2011   Left 4cm adrenal myelolipoma  . OSTEOTOMY AND ULNAR SHORTENING  2001   [name]  . US ECHOCARDIOGRAPHY  08/2011   EF of 55-60% and indeterminate diastolic function  . VENTRAL HERNIA REPAIR  03/01/2012   Procedure: LAPAROSCOPIC VENTRAL HERNIA;  Surgeon: Ralene Ok, MD;  Location: [facility] ORS;  Service: General;  Laterality: N/A;    Current Outpatient Prescriptions  Medication Sig Dispense Refill  . ALPRAZolam (XANAX) 0.5 MG tablet Take 0.5 mg by mouth at bedtime.  2  . aspirin EC 81 MG tablet Take 81 mg by mouth every other day.     Marland Kitchen  atorvastatin (LIPITOR) 40 MG tablet TAKE 1 TABLET(40 MG) BY MOUTH AT BEDTIME 90 tablet 1  . budesonide (ENTOCORT EC) 3 MG 24 hr capsule Take 3 capsules (9 mg total) by mouth daily. 90 capsule 3  . cetirizine (ZYRTEC) 10 MG tablet Take 10 mg by mouth daily as needed for allergies.    . cyanocobalamin (,VITAMIN B-12,) 1000 MCG/ML injection  Inject 1 mL (1,000 mcg total) into the muscle every 30 (thirty) days. 10 mL 3  . furosemide (LASIX) 40 MG tablet TAKE 1 TABLET BY MOUTH EVERY DAY 90 tablet 3  . gabapentin (NEURONTIN) 400 MG capsule Take 400-1,200 mg by mouth 3 (three) times daily. Take one capsule in the morning, one capsule 7 hours later and then three capsules at bedtime.  0  . ibuprofen (ADVIL,MOTRIN) 200 MG tablet Take 600 mg by mouth every 6 (six) hours as needed (For pain.).    Marland Kitchen lamoTRIgine (LAMICTAL) 200 MG tablet Take 200 mg by mouth daily.    Marland Kitchen levothyroxine (SYNTHROID, LEVOTHROID) 112 MCG tablet TAKE 2 TABLETS BY MOUTH EVERY DAY BEFORE BREAKFAST 60 tablet 6  . metFORMIN (GLUCOPHAGE) 850 MG tablet TAKE 1 TABLET BY MOUTH TWICE DAILY WITH A MEAL 180 tablet 3  . nystatin ointment (MYCOSTATIN) Apply 1 application topically daily as needed. Apply to red areas in groin 30 g 1  . omeprazole (PRILOSEC) 40 MG capsule TAKE 1 CAPSULE (40 MG TOTAL) BY MOUTH DAILY. (Patient taking differently: Take 40 mg by mouth daily as needed. ) 30 capsule 6  . potassium chloride (K-DUR) 10 MEQ tablet TAKE 2 TABLETS(20 MEQ) BY MOUTH DAILY 180 tablet 1  . ramipril (ALTACE) 10 MG capsule TAKE 1 CAPSULE BY MOUTH EVERY DAY 90 capsule 1  . rOPINIRole (REQUIP) 3 MG tablet TAKE 1 TABLET(3 MG) BY MOUTH AT BEDTIME 90 tablet 0  . triamcinolone cream (KENALOG) 0.1 % APPLY TO AFFECTED AREA ON EXTERNAL EAR TWICE DAILY. DO NOT USE FOR MORE THAN 2 WEEKS AT A TIME 30 g 0  . Vilazodone HCl (VIIBRYD) 40 MG TABS Take 40 mg by mouth every morning.    . nitroGLYCERIN (NITROSTAT) 0.4 MG SL tablet Place 1 tablet (0.4 mg total) under the tongue every 5 (five) minutes as needed for chest pain. 25 tablet 3   Current Facility-Administered Medications  Medication Dose Route Frequency Provider Last Rate Last Dose  . triamcinolone acetonide (KENALOG) 10 MG/ML injection 10 mg  10 mg Other Once [employer], DPM         Allergies:   Augmentin [amoxicillin-pot clavulanate];  Erythromycin base; and Neomycin   Social History:  The patient  reports that she quit smoking about 18 years ago. She has never used smokeless tobacco. She reports that she does not drink alcohol or use drugs.   Family History:   family history includes Coronary artery disease (age of onset: 40) in her mother; Diabetes in her mother; Heart attack (age of onset: 39) in her brother; Heart attack (age of onset: 69) in her father; Stroke in her mother.    Review of Systems: Review of Systems  Constitutional: Negative.   Respiratory: Negative.   Cardiovascular: Positive for chest pain.  Gastrointestinal: Negative.   Musculoskeletal: Negative.   Neurological: Negative.   Psychiatric/Behavioral: Negative.   All other systems reviewed and are negative.    PHYSICAL EXAM: VS:  BP 130/84 (BP Location: Left Arm, Patient Position: Sitting, Cuff Size: Large)   Pulse 64   Ht 5' 5.5" (1.664 m)  Wt (!) 328 lb (148.8 kg)   BMI 53.75 kg/m  , BMI Body mass index is 53.75 kg/m. GEN: Well nourished, well developed, in no acute distress , obese HEENT: normal  Neck: no JVD, carotid bruits, or masses Cardiac: RRR; no murmurs, rubs, or gallops,no edema  Respiratory:  clear to auscultation bilaterally, normal work of breathing GI: soft, nontender, nondistended, + BS Caitlyn: no deformity or atrophy  Skin: warm and dry, no rash Neuro:  Strength and sensation are intact Psych: euthymic mood, full affect    Recent Labs: 08/16/2015: BUN 20; Creatinine, Ser 0.74; Hemoglobin 13.5; Platelets 215.0; Potassium 4.1; Sodium 142; TSH 0.47    Lipid Panel Lab Results  Component Value Date   CHOL 248 (H) 12/17/2015   HDL 56.10 12/17/2015   LDLCALC 158 (H) 12/17/2015   TRIG 172.0 (H) 12/17/2015      Wt Readings from Last 3 Encounters:  06/02/16 (!) 328 lb (148.8 kg)  05/01/16 (!) 333 lb (151 kg)  12/21/15 (!) 327 lb 8 oz (148.6 kg)       ASSESSMENT AND PLAN:  Coronary artery disease of native artery  of native heart with stable angina pectoris ([place]) - Plan: EKG 12-Lead, NM Myocar Multi W/Spect W/Wall Motion / EF Previous cardiac catheterization results discussed with her  Chest pain, unspecified type - Plan: EKG 12-Lead, NM Myocar Multi W/Spect W/Wall Motion / EF Chest pain presenting this morning, etiology unclear Prior history of coronary disease noted in diagonal vessel in 2014. Some typical as well as atypical features.  Long discussion concerning first treatment options including stress testing/Myoview or CT coronary calcium scoring for risk stratification. We did discuss echocardiogram and what this will show. She prefers stress testing given the severity of her pain  Mixed hyperlipidemia Encouraged her to stay on her Lipitor 40 mg daily Previously total cholesterol 150 when she is taking the medication  Chronic diastolic heart failure (HCC) Currently takes Lasix daily, appears relatively euvolemic on exam  Type 2 diabetes mellitus, uncontrolled, with neuropathy ([place]) We have encouraged continued exercise, careful diet management in an effort to lose weight.  OSA on CPAP Reports that she is compliant with her CPAP She is indicated she is scheduled for further testing as she has fatigue, not sleeping well  Morbid obesity Recommended regular exercise program, low carbohydrate diet   Total encounter time more than 25 minutes  Greater than 50% was spent in counseling and coordination of care with the patient   Disposition:   F/U  6 months   Orders Placed This Encounter  Procedures  . NM Myocar Multi W/Spect W/Wall Motion / EF  . EKG 12-Lead     Signed, Esmond Plants, M.D., Ph.D. 06/02/2016  [place], [place]

## 2016-06-02 NOTE — Telephone Encounter (Signed)
Pt states she has has tightness, not severe  Pt c/o of Chest Pain: STAT if CP now or developed within 24 hours  1. Are you having CP right now? No, but did have it this morning that woke her up, she thinks it is indigestion   2. Are you experiencing any other symptoms (ex. SOB, nausea, vomiting, sweating)? Headache for the last 4 days  3. How long have you been experiencing CP? 1 episode today, severe episodes in the last couple weeks  4. Is your CP continuous or coming and going? Comes and goes  5. Have you taken Nitroglycerin? no ? States she has been taking aspirin

## 2016-06-02 NOTE — Telephone Encounter (Signed)
Pt coming in to see Dr. Rockey Situ at 1:40.

## 2016-06-07 ENCOUNTER — Other Ambulatory Visit: Payer: Self-pay | Admitting: Family Medicine

## 2016-06-09 ENCOUNTER — Ambulatory Visit: Payer: Medicare HMO | Admitting: Cardiovascular Disease

## 2016-06-09 ENCOUNTER — Ambulatory Visit: Payer: Medicare HMO | Admitting: Pulmonary Disease

## 2016-06-13 ENCOUNTER — Telehealth: Payer: Self-pay

## 2016-06-13 NOTE — Telephone Encounter (Signed)
Left pt detailed message on vm reminding her of Lexi tomorrow @ 9:00. Asked her to arrive @ 8:45, reiterated instructions, and asked her to call back if she will be unable to keep this appt.

## 2016-06-14 ENCOUNTER — Other Ambulatory Visit: Payer: Medicare HMO

## 2016-06-14 ENCOUNTER — Ambulatory Visit
Admission: RE | Admit: 2016-06-14 | Discharge: 2016-06-14 | Disposition: A | Discharge status: Home / Self Care | Payer: Medicare HMO | Source: Ambulatory Visit | Attending: Cardiovascular Disease | Admitting: Cardiovascular Disease

## 2016-06-14 ENCOUNTER — Other Ambulatory Visit (HOSPITAL_COMMUNITY): Payer: Medicare HMO

## 2016-06-14 DIAGNOSIS — I25118 Atherosclerotic heart disease of native coronary artery with other forms of angina pectoris: Secondary | ICD-10-CM | POA: Diagnosis not present

## 2016-06-14 DIAGNOSIS — R9439 Abnormal result of other cardiovascular function study: Secondary | ICD-10-CM | POA: Diagnosis not present

## 2016-06-14 DIAGNOSIS — R079 Chest pain, unspecified: Secondary | ICD-10-CM | POA: Diagnosis not present

## 2016-06-14 DIAGNOSIS — I259 Chronic ischemic heart disease, unspecified: Secondary | ICD-10-CM | POA: Diagnosis not present

## 2016-06-14 MED ORDER — TECHNETIUM TC 99M TETROFOSMIN IV KIT
28.9000 | PACK | Freq: Once | INTRAVENOUS | Status: AC | PRN
  Administered 2016-06-14: 28.9 via INTRAVENOUS

## 2016-06-14 MED ORDER — REGADENOSON 0.4 MG/5ML IV SOLN
0.4000 mg | Freq: Once | INTRAVENOUS | Status: AC
  Administered 2016-06-14: 0.4 mg via INTRAVENOUS

## 2016-06-15 ENCOUNTER — Ambulatory Visit
Admission: RE | Admit: 2016-06-15 | Discharge: 2016-06-15 | Disposition: A | Discharge status: Home / Self Care | Payer: Medicare HMO | Source: Ambulatory Visit | Attending: Cardiovascular Disease | Admitting: Cardiovascular Disease

## 2016-06-15 ENCOUNTER — Other Ambulatory Visit (HOSPITAL_COMMUNITY): Payer: Medicare HMO

## 2016-06-15 DIAGNOSIS — I259 Chronic ischemic heart disease, unspecified: Secondary | ICD-10-CM | POA: Insufficient documentation

## 2016-06-15 DIAGNOSIS — I25118 Atherosclerotic heart disease of native coronary artery with other forms of angina pectoris: Secondary | ICD-10-CM | POA: Diagnosis not present

## 2016-06-15 DIAGNOSIS — R079 Chest pain, unspecified: Secondary | ICD-10-CM | POA: Insufficient documentation

## 2016-06-15 DIAGNOSIS — R9439 Abnormal result of other cardiovascular function study: Secondary | ICD-10-CM | POA: Insufficient documentation

## 2016-06-15 HISTORY — PX: CARDIAC CATHETERIZATION: SHX172

## 2016-06-15 LAB — NM MYOCAR MULTI W/SPECT W/WALL MOTION / EF
CHL CUP NUCLEAR SSS: 20
CHL CUP STRESS STAGE 1 HR: 67 {beats}/min
CHL CUP STRESS STAGE 3 GRADE: 0 %
CHL CUP STRESS STAGE 3 HR: 83 {beats}/min
CHL CUP STRESS STAGE 3 SPEED: 0 mph
CHL CUP STRESS STAGE 4 SPEED: 0 mph
CHL CUP STRESS STAGE 5 GRADE: 0 %
CHL CUP STRESS STAGE 5 HR: 79 {beats}/min
CHL CUP STRESS STAGE 5 SPEED: 0 mph
CSEPHR: 58 %
CSEPPHR: 83 {beats}/min
Estimated workload: 1 METS
LVDIAVOL: 119 mL (ref 46–106)
LVSYSVOL: 39 mL
NUC STRESS TID: 1.23
Percent of predicted max HR: 53 %
Rest HR: 67 {beats}/min
SDS: 8
SRS: 12
Stage 1 Grade: 0 %
Stage 1 Speed: 0 mph
Stage 2 Grade: 0 %
Stage 2 HR: 67 {beats}/min
Stage 2 Speed: 0 mph
Stage 4 Grade: 0 %
Stage 4 HR: 86 {beats}/min
Stage 5 DBP: 55 mmHg
Stage 5 SBP: 134 mmHg

## 2016-06-15 MED ORDER — TECHNETIUM TC 99M TETROFOSMIN IV KIT
32.5200 | PACK | Freq: Once | INTRAVENOUS | Status: AC | PRN
  Administered 2016-06-15: 32.52 via INTRAVENOUS

## 2016-06-19 ENCOUNTER — Telehealth: Payer: Self-pay

## 2016-06-19 ENCOUNTER — Other Ambulatory Visit: Payer: Self-pay

## 2016-06-19 DIAGNOSIS — R079 Chest pain, unspecified: Secondary | ICD-10-CM

## 2016-06-19 NOTE — Telephone Encounter (Signed)
-----   Message from Stana Bunting, RN sent at 06/19/2016  2:56 PM EST ----- Pt called back.  She states that she would prefer to have her cath at [facility] and would prefer to have Dr. Burt Knack perform, if possible, as he did a previous cath on her.  Advised her that I will make Dr. Rockey Situ aware and that she will receive a call back w/ further instructions.

## 2016-06-19 NOTE — Telephone Encounter (Signed)
That would be fine if Dr. Burt Knack is available to perform the procedure Recent stress test suggesting anterior wall ischemia We can get in touch with Dr. Antionette Char nurse to help facilitate scheduling a catheterization in [place] when he has an opening

## 2016-06-20 ENCOUNTER — Other Ambulatory Visit (INDEPENDENT_AMBULATORY_CARE_PROVIDER_SITE_OTHER): Payer: Medicare HMO

## 2016-06-20 DIAGNOSIS — R079 Chest pain, unspecified: Secondary | ICD-10-CM | POA: Diagnosis not present

## 2016-06-20 LAB — CBC WITH DIFFERENTIAL/PLATELET
BASOS PCT: 1.1 % (ref 0.0–3.0)
Basophils Absolute: 0.1 10*3/uL (ref 0.0–0.1)
EOS PCT: 4.2 % (ref 0.0–5.0)
Eosinophils Absolute: 0.2 10*3/uL (ref 0.0–0.7)
HEMATOCRIT: 38.9 % (ref 36.0–46.0)
HEMOGLOBIN: 12.8 g/dL (ref 12.0–15.0)
LYMPHS PCT: 29.7 % (ref 12.0–46.0)
Lymphs Abs: 1.5 10*3/uL (ref 0.7–4.0)
MCHC: 32.8 g/dL (ref 30.0–36.0)
MCV: 89.6 fl (ref 78.0–100.0)
MONO ABS: 0.4 10*3/uL (ref 0.1–1.0)
Monocytes Relative: 7.1 % (ref 3.0–12.0)
Neutro Abs: 3 10*3/uL (ref 1.4–7.7)
Neutrophils Relative %: 57.9 % (ref 43.0–77.0)
Platelets: 252 10*3/uL (ref 150.0–400.0)
RBC: 4.34 Mil/uL (ref 3.87–5.11)
RDW: 14.8 % (ref 11.5–15.5)
WBC: 5.2 10*3/uL (ref 4.0–10.5)

## 2016-06-20 LAB — BASIC METABOLIC PANEL
BUN: 15 mg/dL (ref 6–23)
CHLORIDE: 101 meq/L (ref 96–112)
CO2: 29 meq/L (ref 19–32)
Calcium: 8.9 mg/dL (ref 8.4–10.5)
Creatinine, Ser: 0.84 mg/dL (ref 0.40–1.20)
GFR: 72.52 mL/min (ref >60.00)
Glucose, Bld: 198 mg/dL — ABNORMAL HIGH (ref 70–99)
POTASSIUM: 4 meq/L (ref 3.5–5.1)
Sodium: 141 mEq/L (ref 135–145)

## 2016-06-20 LAB — PROTIME-INR
INR: 1.1 ratio — ABNORMAL HIGH (ref 0.8–1.0)
Prothrombin Time: 11 s (ref 9.6–13.1)

## 2016-06-20 NOTE — Telephone Encounter (Signed)
Left detailed message on pt's vm that she will need to go to the [facility] for preprocedure labs. Advised her that she does not need an appt for this, to go to the 1st desk and the volunteers will show her where to go. Asked her to call back w/ any questions or concerns.

## 2016-06-20 NOTE — Telephone Encounter (Signed)
I spoke with the pt and scheduled her for L/C/P on 06/23/16 with Dr Burt Knack, 7:30 AM case with arrival at 5:30 AM.  NPO after midnight and medication restrictions reviewed with pt (see pre-procedure instructions in letter tab of chart).  The pt does need to have a BMP, CBC and PT/INR drawn today or tomorrow at the [place] office. I will have Mandy contact the pt to advise her on coming in for labs. I have also copied instructions into My Chart for the pt to view.

## 2016-06-20 NOTE — Addendum Note (Signed)
Addended by: Elmon Kirschner A on: 06/20/2016 11:04 AM   Modules accepted: Orders

## 2016-06-21 ENCOUNTER — Other Ambulatory Visit: Payer: Self-pay | Admitting: Cardiovascular Disease

## 2016-06-21 ENCOUNTER — Encounter: Payer: Self-pay | Admitting: Cardiovascular Disease

## 2016-06-21 DIAGNOSIS — I200 Unstable angina: Secondary | ICD-10-CM

## 2016-06-22 ENCOUNTER — Ambulatory Visit (HOSPITAL_COMMUNITY): Admission: RE | Admit: 2016-06-22 | Payer: Medicare HMO | Source: Ambulatory Visit | Admitting: Cardiovascular Disease

## 2016-06-22 ENCOUNTER — Encounter (HOSPITAL_COMMUNITY): Admission: RE | Payer: Self-pay | Source: Ambulatory Visit

## 2016-06-22 ENCOUNTER — Telehealth: Payer: Self-pay | Admitting: Cardiovascular Disease

## 2016-06-22 SURGERY — LEFT HEART CATH AND CORONARY ANGIOGRAPHY
Anesthesia: Moderate Sedation | Laterality: Bilateral

## 2016-06-22 NOTE — Telephone Encounter (Signed)
Pt is scheduled for a cardiac cath tomorrow 06/23/16. Pt states she was advised to take the last Metformin yesterday (wednesday) evening. She got confused and   took the metformin dose this AM. Pt would like to know if she can still  have the cardiac cath  tomorrow.

## 2016-06-22 NOTE — Telephone Encounter (Signed)
New Message   Pt is scheduled for procedure tomorrow, and accidentally took a Metformin this morning. Wanted to check with nurse to make sure she can still come in for procedure.

## 2016-06-22 NOTE — Telephone Encounter (Signed)
DISCUSSED WITH DR  COOPER  PER  DR COOPER  MAY PROCEED WITH CATH  TOMORROW  AND  REINFORCED  TO PT  NOT  TO TAKE  METFORMIN  TONIGHT OR IN  THE AM   VERBALIZED UNDERSTANDING .[name]

## 2016-06-23 ENCOUNTER — Encounter (HOSPITAL_COMMUNITY)
Admission: RE | Disposition: A | Discharge status: Home / Self Care | Payer: Self-pay | Source: Ambulatory Visit | Attending: Cardiovascular Disease

## 2016-06-23 ENCOUNTER — Other Ambulatory Visit: Payer: Self-pay

## 2016-06-23 ENCOUNTER — Ambulatory Visit: Payer: Medicare HMO | Attending: Family Medicine

## 2016-06-23 ENCOUNTER — Ambulatory Visit (HOSPITAL_COMMUNITY)
Admission: RE | Admit: 2016-06-23 | Discharge: 2016-06-23 | Disposition: A | Discharge status: Home / Self Care | Payer: Medicare HMO | Source: Ambulatory Visit | Attending: Cardiovascular Disease | Admitting: Cardiovascular Disease

## 2016-06-23 ENCOUNTER — Other Ambulatory Visit: Payer: Self-pay | Admitting: *Deleted

## 2016-06-23 DIAGNOSIS — I25118 Atherosclerotic heart disease of native coronary artery with other forms of angina pectoris: Secondary | ICD-10-CM | POA: Diagnosis not present

## 2016-06-23 DIAGNOSIS — Z833 Family history of diabetes mellitus: Secondary | ICD-10-CM | POA: Insufficient documentation

## 2016-06-23 DIAGNOSIS — E782 Mixed hyperlipidemia: Secondary | ICD-10-CM | POA: Insufficient documentation

## 2016-06-23 DIAGNOSIS — Z7982 Long term (current) use of aspirin: Secondary | ICD-10-CM | POA: Diagnosis not present

## 2016-06-23 DIAGNOSIS — E1140 Type 2 diabetes mellitus with diabetic neuropathy, unspecified: Secondary | ICD-10-CM | POA: Insufficient documentation

## 2016-06-23 DIAGNOSIS — E039 Hypothyroidism, unspecified: Secondary | ICD-10-CM | POA: Insufficient documentation

## 2016-06-23 DIAGNOSIS — F329 Major depressive disorder, single episode, unspecified: Secondary | ICD-10-CM | POA: Diagnosis not present

## 2016-06-23 DIAGNOSIS — Z823 Family history of stroke: Secondary | ICD-10-CM | POA: Insufficient documentation

## 2016-06-23 DIAGNOSIS — Z6841 Body Mass Index (BMI) 40.0 and over, adult: Secondary | ICD-10-CM | POA: Diagnosis not present

## 2016-06-23 DIAGNOSIS — K219 Gastro-esophageal reflux disease without esophagitis: Secondary | ICD-10-CM | POA: Insufficient documentation

## 2016-06-23 DIAGNOSIS — F419 Anxiety disorder, unspecified: Secondary | ICD-10-CM | POA: Diagnosis not present

## 2016-06-23 DIAGNOSIS — G2581 Restless legs syndrome: Secondary | ICD-10-CM | POA: Insufficient documentation

## 2016-06-23 DIAGNOSIS — E538 Deficiency of other specified B group vitamins: Secondary | ICD-10-CM | POA: Diagnosis not present

## 2016-06-23 DIAGNOSIS — I2510 Atherosclerotic heart disease of native coronary artery without angina pectoris: Secondary | ICD-10-CM | POA: Diagnosis not present

## 2016-06-23 DIAGNOSIS — G4733 Obstructive sleep apnea (adult) (pediatric): Secondary | ICD-10-CM | POA: Insufficient documentation

## 2016-06-23 DIAGNOSIS — Z8249 Family history of ischemic heart disease and other diseases of the circulatory system: Secondary | ICD-10-CM | POA: Insufficient documentation

## 2016-06-23 DIAGNOSIS — Z87891 Personal history of nicotine dependence: Secondary | ICD-10-CM | POA: Insufficient documentation

## 2016-06-23 DIAGNOSIS — I110 Hypertensive heart disease with heart failure: Secondary | ICD-10-CM | POA: Diagnosis not present

## 2016-06-23 DIAGNOSIS — Z7984 Long term (current) use of oral hypoglycemic drugs: Secondary | ICD-10-CM | POA: Insufficient documentation

## 2016-06-23 DIAGNOSIS — G4710 Hypersomnia, unspecified: Secondary | ICD-10-CM | POA: Insufficient documentation

## 2016-06-23 DIAGNOSIS — I200 Unstable angina: Secondary | ICD-10-CM

## 2016-06-23 DIAGNOSIS — K760 Fatty (change of) liver, not elsewhere classified: Secondary | ICD-10-CM | POA: Diagnosis not present

## 2016-06-23 DIAGNOSIS — E559 Vitamin D deficiency, unspecified: Secondary | ICD-10-CM | POA: Insufficient documentation

## 2016-06-23 DIAGNOSIS — Z880 Allergy status to penicillin: Secondary | ICD-10-CM | POA: Insufficient documentation

## 2016-06-23 DIAGNOSIS — R9439 Abnormal result of other cardiovascular function study: Secondary | ICD-10-CM | POA: Diagnosis present

## 2016-06-23 DIAGNOSIS — R931 Abnormal findings on diagnostic imaging of heart and coronary circulation: Secondary | ICD-10-CM | POA: Diagnosis present

## 2016-06-23 DIAGNOSIS — M1712 Unilateral primary osteoarthritis, left knee: Secondary | ICD-10-CM | POA: Insufficient documentation

## 2016-06-23 DIAGNOSIS — E1165 Type 2 diabetes mellitus with hyperglycemia: Secondary | ICD-10-CM | POA: Diagnosis not present

## 2016-06-23 DIAGNOSIS — R51 Headache: Secondary | ICD-10-CM | POA: Diagnosis not present

## 2016-06-23 DIAGNOSIS — I5032 Chronic diastolic (congestive) heart failure: Secondary | ICD-10-CM | POA: Insufficient documentation

## 2016-06-23 HISTORY — PX: LEFT HEART CATH AND CORONARY ANGIOGRAPHY: CATH118249

## 2016-06-23 LAB — GLUCOSE, CAPILLARY
Glucose-Capillary: 182 mg/dL — ABNORMAL HIGH (ref 65–99)
Glucose-Capillary: 166 mg/dL — ABNORMAL HIGH (ref 65–99)

## 2016-06-23 SURGERY — LEFT HEART CATH AND CORONARY ANGIOGRAPHY

## 2016-06-23 MED ORDER — SODIUM CHLORIDE 0.9% FLUSH: 3.0000 mL | Freq: Two times a day (BID) | INTRAVENOUS | Status: DC

## 2016-06-23 MED ORDER — VERAPAMIL HCL 2.5 MG/ML IV SOLN
INTRAVENOUS | Status: AC
  Filled 2016-06-23: qty 2

## 2016-06-23 MED ORDER — HEPARIN (PORCINE) IN NACL 2-0.9 UNIT/ML-% IJ SOLN
INTRAMUSCULAR | Status: AC
  Filled 2016-06-23: qty 1000

## 2016-06-23 MED ORDER — FENTANYL CITRATE (PF) 100 MCG/2ML IJ SOLN
INTRAMUSCULAR | Status: AC
  Filled 2016-06-23: qty 2

## 2016-06-23 MED ORDER — LIDOCAINE HCL (PF) 1 % IJ SOLN
INTRAMUSCULAR | Status: DC | PRN
  Administered 2016-06-23: 5 mL

## 2016-06-23 MED ORDER — SODIUM CHLORIDE 0.9 % WEIGHT BASED INFUSION: 1.0000 mL/kg/h | INTRAVENOUS | Status: DC

## 2016-06-23 MED ORDER — ASPIRIN 81 MG PO CHEW: 81.0000 mg | CHEWABLE_TABLET | ORAL | Status: AC

## 2016-06-23 MED ORDER — SODIUM CHLORIDE 0.9 % IV SOLN: INTRAVENOUS | Status: DC

## 2016-06-23 MED ORDER — IOPAMIDOL (ISOVUE-370) INJECTION 76%
INTRAVENOUS | Status: DC | PRN
  Administered 2016-06-23: 45 mL via INTRA_ARTERIAL

## 2016-06-23 MED ORDER — VERAPAMIL HCL 2.5 MG/ML IV SOLN
INTRAVENOUS | Status: DC | PRN
  Administered 2016-06-23: 10 mL via INTRA_ARTERIAL

## 2016-06-23 MED ORDER — ACETAMINOPHEN 325 MG PO TABS: 650.0000 mg | ORAL_TABLET | ORAL | Status: DC | PRN

## 2016-06-23 MED ORDER — SODIUM CHLORIDE 0.9% FLUSH: 3.0000 mL | INTRAVENOUS | Status: DC | PRN

## 2016-06-23 MED ORDER — SODIUM CHLORIDE 0.9 % IV SOLN: 250.0000 mL | INTRAVENOUS | Status: DC | PRN

## 2016-06-23 MED ORDER — FENTANYL CITRATE (PF) 100 MCG/2ML IJ SOLN
INTRAMUSCULAR | Status: DC | PRN
  Administered 2016-06-23: 25 ug via INTRAVENOUS
  Administered 2016-06-23: 50 ug via INTRAVENOUS

## 2016-06-23 MED ORDER — IOPAMIDOL (ISOVUE-370) INJECTION 76%
INTRAVENOUS | Status: AC
  Filled 2016-06-23: qty 100

## 2016-06-23 MED ORDER — HEPARIN SODIUM (PORCINE) 1000 UNIT/ML IJ SOLN
INTRAMUSCULAR | Status: DC | PRN
  Administered 2016-06-23: 5000 [IU] via INTRAVENOUS

## 2016-06-23 MED ORDER — ONDANSETRON HCL 4 MG/2ML IJ SOLN: 4.0000 mg | Freq: Four times a day (QID) | INTRAMUSCULAR | Status: DC | PRN

## 2016-06-23 MED ORDER — SODIUM CHLORIDE 0.9 % WEIGHT BASED INFUSION
3.0000 mL/kg/h | INTRAVENOUS | Status: DC
  Administered 2016-06-23: 3 mL/kg/h via INTRAVENOUS

## 2016-06-23 MED ORDER — MIDAZOLAM HCL 2 MG/2ML IJ SOLN
INTRAMUSCULAR | Status: DC | PRN
  Administered 2016-06-23 (×2): 1 mg via INTRAVENOUS

## 2016-06-23 MED ORDER — MIDAZOLAM HCL 2 MG/2ML IJ SOLN
INTRAMUSCULAR | Status: AC
  Filled 2016-06-23: qty 2

## 2016-06-23 MED ORDER — HEPARIN (PORCINE) IN NACL 2-0.9 UNIT/ML-% IJ SOLN
INTRAMUSCULAR | Status: DC | PRN
  Administered 2016-06-23: 1000 mL

## 2016-06-23 MED ORDER — HEPARIN SODIUM (PORCINE) 1000 UNIT/ML IJ SOLN
INTRAMUSCULAR | Status: AC
  Filled 2016-06-23: qty 1

## 2016-06-23 MED ORDER — LIDOCAINE HCL (PF) 1 % IJ SOLN
INTRAMUSCULAR | Status: AC
  Filled 2016-06-23: qty 30

## 2016-06-23 MED ORDER — OMEPRAZOLE 40 MG PO CPDR: DELAYED_RELEASE_CAPSULE | ORAL | 6 refills | Status: DC

## 2016-06-23 SURGICAL SUPPLY — 10 items
CATH EXPO 5F FL3.5 (CATHETERS) ×2 IMPLANT
CATH EXPO 5FR FR4 (CATHETERS) ×2 IMPLANT
DEVICE RAD COMP TR BAND LRG (VASCULAR PRODUCTS) ×2 IMPLANT
GLIDESHEATH SLEND SS 6F .021 (SHEATH) ×2 IMPLANT
GUIDEWIRE INQWIRE 1.5J.035X260 (WIRE) ×1 IMPLANT
INQWIRE 1.5J .035X260CM (WIRE) ×2
KIT HEART LEFT (KITS) ×2 IMPLANT
PACK CARDIAC CATHETERIZATION (CUSTOM PROCEDURE TRAY) ×2 IMPLANT
TRANSDUCER W/STOPCOCK (MISCELLANEOUS) ×2 IMPLANT
TUBING CIL FLEX 10 FLL-RA (TUBING) ×2 IMPLANT

## 2016-06-23 NOTE — Interval H&P Note (Signed)
History and Physical Interval Note:  06/23/2016 7:45 AM  [name]  has presented today for surgery, with the diagnosis of cp - cad  The various methods of treatment have been discussed with the patient and family. After consideration of risks, benefits and other options for treatment, the patient has consented to  Procedure(s): Left Heart Cath and Coronary Angiography (N/A) as a surgical intervention .  The patient's history has been reviewed, patient examined, no change in status, stable for surgery.  I have reviewed the patient's chart and labs.  Questions were answered to the patient's satisfaction.     Sherren Mocha

## 2016-06-23 NOTE — Progress Notes (Signed)
Pt c/o chest pressure.  O2 at 2/L applied and EKG obtained which is normal.  Dr. Copper notified and in to see pt. States pt is OK to go home.

## 2016-06-23 NOTE — Discharge Instructions (Signed)

## 2016-06-23 NOTE — Research (Signed)
OPTIMIZE Informed Consent   Subject Name: [name]  Subject met inclusion and exclusion criteria.  The informed consent form, study requirements and expectations were reviewed with the subject and questions and concerns were addressed prior to the signing of the consent form.  The subject verbalized understanding of the trail requirements.  The subject agreed to participate in the OPTIMIZE trial and signed the informed consent.  The informed consent was obtained prior to performance of any protocol-specific procedures for the subject.  A copy of the signed informed consent was given to the subject and a copy was placed in the subject's medical record.  Hedrick,Jahari Billy W 06/23/2016, 0029

## 2016-06-23 NOTE — H&P (View-Only) (Signed)
Cardiology Office Note  Date:  06/02/2016   ID:  Trujillo, Caitlyn [birth year], MRN [id number]  PCP:  Ria Bush, MD   Chief Complaint  Patient presents with  . other     Early f/u due to chest tightness. Pt states she woke up this morning with severe pain in chest, as well as some pain in left jaw. Pt took five 81mg  aspirin which eased the pain. Pt also states shes had a headache for 4 days. Reviewed meds with pt verbally.    HPI:  [name] is a very pleasant 66 year old woman with history of obesity, hyperlipidemia, hypothyroidism, diabetes who presents for routine followup of her hyperlipidemia, diabetes as well as chest pain  Long smoking history for 30 years, stopped at age 2. She has sleep apnea, wears CPAP,  restless leg syndrome  In follow-up today she reports having Pain across her back yesterday This Am, CP x2 woke her, lasting  15 min  Took aspirin5  Symptoms resolved but she had continued very mild chest Pressure for a few hours  Previous cath   results reviewed with her from 2014 showing : 30 to 50% ostial diagonal 07/2012. 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  EKG on today's visit shows normal sinus rhythm with rate 64 bpm, no significant ST or T-wave changes  Other past medical history  evaluated in the hospital July 2014 for chest pain. Cardiac enzymes negative, stress test also showed no ischemia. Previous episode of diastolic CHF. Prior echocardiogram in 2013 showing normal ejection fraction.   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  PSH:    Past Surgical History:  Procedure Laterality Date  . CARDIAC CATHETERIZATION  10/12/09   EF 70%  . CARDIAC CATHETERIZATION  08/23/2011   Mild nonobstructive CAD, normal LV fxn  . COLONOSCOPY WITH PROPOFOL N/A 12/21/2015   HP, focal chronic active colitis [name], MD)  . ESOPHAGOGASTRODUODENOSCOPY  2006  . KNEE SURGERY Left 11-02-2008   Partial medial and lateral Menisectomies and Chondroplasty (Dr. [name])  . LEFT HEART CATHETERIZATION WITH CORONARY ANGIOGRAM N/A 08/23/2011   Procedure: LEFT HEART CATHETERIZATION WITH CORONARY ANGIOGRAM;  Surgeon: Sherren Mocha, MD;  Location: [facility] CATH LAB;  Service: Cardiovascular;  Laterality: N/A;  . MR ABDOMEN  01/16/2011   Left 4cm adrenal myelolipoma  . OSTEOTOMY AND ULNAR SHORTENING  2001   [name]  . US ECHOCARDIOGRAPHY  08/2011   EF of 55-60% and indeterminate diastolic function  . VENTRAL HERNIA REPAIR  03/01/2012   Procedure: LAPAROSCOPIC VENTRAL HERNIA;  Surgeon: Ralene Ok, MD;  Location: [facility] ORS;  Service: General;  Laterality: N/A;    Current Outpatient Prescriptions  Medication Sig Dispense Refill  . ALPRAZolam (XANAX) 0.5 MG tablet Take 0.5 mg by mouth at bedtime.  2  . aspirin EC 81 MG tablet Take 81 mg by mouth every other day.     Marland Kitchen  atorvastatin (LIPITOR) 40 MG tablet TAKE 1 TABLET(40 MG) BY MOUTH AT BEDTIME 90 tablet 1  . budesonide (ENTOCORT EC) 3 MG 24 hr capsule Take 3 capsules (9 mg total) by mouth daily. 90 capsule 3  . cetirizine (ZYRTEC) 10 MG tablet Take 10 mg by mouth daily as needed for allergies.    . cyanocobalamin (,VITAMIN B-12,) 1000 MCG/ML injection  Inject 1 mL (1,000 mcg total) into the muscle every 30 (thirty) days. 10 mL 3  . furosemide (LASIX) 40 MG tablet TAKE 1 TABLET BY MOUTH EVERY DAY 90 tablet 3  . gabapentin (NEURONTIN) 400 MG capsule Take 400-1,200 mg by mouth 3 (three) times daily. Take one capsule in the morning, one capsule 7 hours later and then three capsules at bedtime.  0  . ibuprofen (ADVIL,MOTRIN) 200 MG tablet Take 600 mg by mouth every 6 (six) hours as needed (For pain.).    Marland Kitchen lamoTRIgine (LAMICTAL) 200 MG tablet Take 200 mg by mouth daily.    Marland Kitchen levothyroxine (SYNTHROID, LEVOTHROID) 112 MCG tablet TAKE 2 TABLETS BY MOUTH EVERY DAY BEFORE BREAKFAST 60 tablet 6  . metFORMIN (GLUCOPHAGE) 850 MG tablet TAKE 1 TABLET BY MOUTH TWICE DAILY WITH A MEAL 180 tablet 3  . nystatin ointment (MYCOSTATIN) Apply 1 application topically daily as needed. Apply to red areas in groin 30 g 1  . omeprazole (PRILOSEC) 40 MG capsule TAKE 1 CAPSULE (40 MG TOTAL) BY MOUTH DAILY. (Patient taking differently: Take 40 mg by mouth daily as needed. ) 30 capsule 6  . potassium chloride (K-DUR) 10 MEQ tablet TAKE 2 TABLETS(20 MEQ) BY MOUTH DAILY 180 tablet 1  . ramipril (ALTACE) 10 MG capsule TAKE 1 CAPSULE BY MOUTH EVERY DAY 90 capsule 1  . rOPINIRole (REQUIP) 3 MG tablet TAKE 1 TABLET(3 MG) BY MOUTH AT BEDTIME 90 tablet 0  . triamcinolone cream (KENALOG) 0.1 % APPLY TO AFFECTED AREA ON EXTERNAL EAR TWICE DAILY. DO NOT USE FOR MORE THAN 2 WEEKS AT A TIME 30 g 0  . Vilazodone HCl (VIIBRYD) 40 MG TABS Take 40 mg by mouth every morning.    . nitroGLYCERIN (NITROSTAT) 0.4 MG SL tablet Place 1 tablet (0.4 mg total) under the tongue every 5 (five) minutes as needed for chest pain. 25 tablet 3   Current Facility-Administered Medications  Medication Dose Route Frequency Provider Last Rate Last Dose  . triamcinolone acetonide (KENALOG) 10 MG/ML injection 10 mg  10 mg Other Once [employer], DPM         Allergies:   Augmentin [amoxicillin-pot clavulanate];  Erythromycin base; and Neomycin   Social History:  The patient  reports that she quit smoking about 18 years ago. She has never used smokeless tobacco. She reports that she does not drink alcohol or use drugs.   Family History:   family history includes Coronary artery disease (age of onset: 28) in her mother; Diabetes in her mother; Heart attack (age of onset: 64) in her brother; Heart attack (age of onset: 76) in her father; Stroke in her mother.    Review of Systems: Review of Systems  Constitutional: Negative.   Respiratory: Negative.   Cardiovascular: Positive for chest pain.  Gastrointestinal: Negative.   Musculoskeletal: Negative.   Neurological: Negative.   Psychiatric/Behavioral: Negative.   All other systems reviewed and are negative.    PHYSICAL EXAM: VS:  BP 130/84 (BP Location: Left Arm, Patient Position: Sitting, Cuff Size: Large)   Pulse 64   Ht 5' 5.5" (1.664 m)  Wt (!) 328 lb (148.8 kg)   BMI 53.75 kg/m  , BMI Body mass index is 53.75 kg/m. GEN: Well nourished, well developed, in no acute distress , obese HEENT: normal  Neck: no JVD, carotid bruits, or masses Cardiac: RRR; no murmurs, rubs, or gallops,no edema  Respiratory:  clear to auscultation bilaterally, normal work of breathing GI: soft, nontender, nondistended, + BS MS: no deformity or atrophy  Skin: warm and dry, no rash Neuro:  Strength and sensation are intact Psych: euthymic mood, full affect    Recent Labs: 08/16/2015: BUN 20; Creatinine, Ser 0.74; Hemoglobin 13.5; Platelets 215.0; Potassium 4.1; Sodium 142; TSH 0.47    Lipid Panel Lab Results  Component Value Date   CHOL 248 (H) 12/17/2015   HDL 56.10 12/17/2015   LDLCALC 158 (H) 12/17/2015   TRIG 172.0 (H) 12/17/2015      Wt Readings from Last 3 Encounters:  06/02/16 (!) 328 lb (148.8 kg)  05/01/16 (!) 333 lb (151 kg)  12/21/15 (!) 327 lb 8 oz (148.6 kg)       ASSESSMENT AND PLAN:  Coronary artery disease of native artery  of native heart with stable angina pectoris ([place]) - Plan: EKG 12-Lead, NM Myocar Multi W/Spect W/Wall Motion / EF Previous cardiac catheterization results discussed with her  Chest pain, unspecified type - Plan: EKG 12-Lead, NM Myocar Multi W/Spect W/Wall Motion / EF Chest pain presenting this morning, etiology unclear Prior history of coronary disease noted in diagonal vessel in 2014. Some typical as well as atypical features.  Long discussion concerning first treatment options including stress testing/Myoview or CT coronary calcium scoring for risk stratification. We did discuss echocardiogram and what this will show. She prefers stress testing given the severity of her pain  Mixed hyperlipidemia Encouraged her to stay on her Lipitor 40 mg daily Previously total cholesterol 150 when she is taking the medication  Chronic diastolic heart failure (HCC) Currently takes Lasix daily, appears relatively euvolemic on exam  Type 2 diabetes mellitus, uncontrolled, with neuropathy ([place]) We have encouraged continued exercise, careful diet management in an effort to lose weight.  OSA on CPAP Reports that she is compliant with her CPAP She is indicated she is scheduled for further testing as she has fatigue, not sleeping well  Morbid obesity Recommended regular exercise program, low carbohydrate diet   Total encounter time more than 25 minutes  Greater than 50% was spent in counseling and coordination of care with the patient   Disposition:   F/U  6 months   Orders Placed This Encounter  Procedures  . NM Myocar Multi W/Spect W/Wall Motion / EF  . EKG 12-Lead     Signed, Esmond Plants, M.D., Ph.D. 06/02/2016  [place], [place]

## 2016-06-24 ENCOUNTER — Encounter: Payer: Self-pay | Admitting: Family Medicine

## 2016-06-26 ENCOUNTER — Other Ambulatory Visit: Payer: Medicare HMO

## 2016-06-27 DIAGNOSIS — S82409A Unspecified fracture of shaft of unspecified fibula, initial encounter for closed fracture: Secondary | ICD-10-CM | POA: Diagnosis not present

## 2016-06-27 DIAGNOSIS — G4733 Obstructive sleep apnea (adult) (pediatric): Secondary | ICD-10-CM | POA: Diagnosis not present

## 2016-06-29 ENCOUNTER — Ambulatory Visit: Payer: Medicare HMO | Admitting: Internal Medicine

## 2016-07-04 DIAGNOSIS — R69 Illness, unspecified: Secondary | ICD-10-CM | POA: Diagnosis not present

## 2016-07-04 DIAGNOSIS — F3181 Bipolar II disorder: Secondary | ICD-10-CM | POA: Diagnosis not present

## 2016-07-04 DIAGNOSIS — Z79899 Other long term (current) drug therapy: Secondary | ICD-10-CM | POA: Diagnosis not present

## 2016-07-10 ENCOUNTER — Ambulatory Visit: Payer: Medicare HMO | Admitting: Internal Medicine

## 2016-07-11 ENCOUNTER — Other Ambulatory Visit: Payer: Self-pay | Admitting: Family Medicine

## 2016-07-11 NOTE — Telephone Encounter (Signed)
Ok to refill? Last filled 04/12/16 #[id number]

## 2016-07-12 ENCOUNTER — Other Ambulatory Visit: Payer: Self-pay | Admitting: *Deleted

## 2016-07-12 ENCOUNTER — Telehealth: Payer: Self-pay | Admitting: *Deleted

## 2016-07-12 MED ORDER — TRIAMCINOLONE ACETONIDE 0.1 % EX CREA: TOPICAL_CREAM | CUTANEOUS | 0 refills | Status: DC

## 2016-07-12 NOTE — Telephone Encounter (Signed)
plz offer scheduling appt with me for 6 mo f/u. However I'm not in office next week.

## 2016-07-12 NOTE — Telephone Encounter (Signed)
PT wrote in the following message via [place]. Please advise.   Appointment Request From: [name]. Ermalinda Barrios    With Provider: Sheral Flow, NP [facility] [place] [facility]    Preferred Date Range: From 07/13/2016 To 07/20/2016    Preferred Times: Any    Reason: To address the following health maintenance concerns.  Hemoglobin A1c    Comments:  I would need lab work for A1c between the hours of 9:00am-11:00am  Thank you

## 2016-07-12 NOTE — Telephone Encounter (Signed)
Forwarded to PCP.

## 2016-07-13 NOTE — Telephone Encounter (Signed)
Message left advising patient to call and schedule f/u with PCP.

## 2016-07-19 NOTE — Telephone Encounter (Signed)
Message left for patient to call and schedule followup °

## 2016-07-20 ENCOUNTER — Encounter: Payer: Self-pay | Admitting: Family Medicine

## 2016-07-20 DIAGNOSIS — R69 Illness, unspecified: Secondary | ICD-10-CM | POA: Diagnosis not present

## 2016-07-20 NOTE — Telephone Encounter (Signed)
Appt scheduled

## 2016-07-24 ENCOUNTER — Ambulatory Visit: Payer: Medicare HMO | Admitting: Internal Medicine

## 2016-07-25 DIAGNOSIS — G4733 Obstructive sleep apnea (adult) (pediatric): Secondary | ICD-10-CM | POA: Diagnosis not present

## 2016-07-25 DIAGNOSIS — S82409A Unspecified fracture of shaft of unspecified fibula, initial encounter for closed fracture: Secondary | ICD-10-CM | POA: Diagnosis not present

## 2016-08-02 ENCOUNTER — Ambulatory Visit: Payer: Medicare HMO | Admitting: Family Medicine

## 2016-08-05 ENCOUNTER — Other Ambulatory Visit: Payer: Self-pay | Admitting: Family Medicine

## 2016-08-10 ENCOUNTER — Ambulatory Visit: Payer: Medicare HMO | Attending: Internal Medicine

## 2016-08-10 ENCOUNTER — Encounter: Payer: Self-pay | Admitting: Internal Medicine

## 2016-08-10 DIAGNOSIS — G4733 Obstructive sleep apnea (adult) (pediatric): Secondary | ICD-10-CM | POA: Insufficient documentation

## 2016-08-10 DIAGNOSIS — R69 Illness, unspecified: Secondary | ICD-10-CM | POA: Diagnosis not present

## 2016-08-18 DIAGNOSIS — G4733 Obstructive sleep apnea (adult) (pediatric): Secondary | ICD-10-CM | POA: Diagnosis not present

## 2016-08-25 DIAGNOSIS — G4733 Obstructive sleep apnea (adult) (pediatric): Secondary | ICD-10-CM | POA: Diagnosis not present

## 2016-08-25 DIAGNOSIS — S82409A Unspecified fracture of shaft of unspecified fibula, initial encounter for closed fracture: Secondary | ICD-10-CM | POA: Diagnosis not present

## 2016-08-28 NOTE — Progress Notes (Deleted)
PULMONARY/SLEEP OFFICE FOLLOW UP  PROBLEMS: OSA Obesity  DATA: 01/04/16 Split night study: AHI 32/hr, lowest SpO2 67%, recommended setting 8 cm H2O with nasal pillows 11/18-12/17/17 CPAP compliance: Usage 27/30 night, >4 hrs 25 nights, <4 hrs 2 night  SUMMARY: 66 y.o. F with OSA first diagnosed in 2011 on CPAP since then. Significant weight gain of at least 50# since initial diagnosis. Persistent daytime hypersomnolence  INTERVAL HISTORY: No major event  SUBJ: Pt referred for re-eval and to review compliance report. She continues to suffer from excessive daytime sleepiness. A repeat Epworth scale reveals a score of 17. She is unsure of her CPAP setting and doesn't know if her machine is an Sales promotion account executive.  OBJ: There were no vitals filed for this visit. Obese, NAD HEENT WNL, nares are widely patent, oropharynx is normal No JVD noted Chest clear to ausc and perc Reg, no M Obese, soft, +BS No C/C/E  BMP Latest Ref Rng & Units 06/20/2016 08/16/2015 10/23/2014  Glucose 70 - 99 mg/dL 198(H) 164(H) 160(H)  BUN 6 - 23 mg/dL 15 20 13   Creatinine 0.40 - 1.20 mg/dL 0.84 0.74 0.83  Sodium 135 - 145 mEq/L 141 142 142  Potassium 3.5 - 5.1 mEq/L 4.0 4.1 4.3  Chloride 96 - 112 mEq/L 101 103 107  CO2 19 - 32 mEq/L 29 29 30   Calcium 8.4 - 10.5 mg/dL 8.9 9.3 9.2   CBC Latest Ref Rng & Units 06/20/2016 08/16/2015 10/23/2014  WBC 4.0 - 10.5 K/uL 5.2 5.7 5.7  Hemoglobin 12.0 - 15.0 g/dL 12.8 13.5 13.8  Hematocrit 36.0 - 46.0 % 38.9 40.4 42.0  Platelets 150.0 - 400.0 K/uL 252.0 215.0 221.0   No recent CXR   DATA: Compliance report as above  IMPRESSION: 1) Moderate OSA with severe desaturation @ baseline 2) Persistent daytime hypersomnolence despite reasonably good CPAP compliance  Concern for inadequate pressure setting 3) Inexorable weight gain which she attributes to dietary indiscretion  PLAN: 1) Repeat CPAP titration study ordered 2) we discussed weight loss strategies and I emphasized  avoidance of processed foods, simple carbohydrates and refined sugar 3) Follow up in 4-6 weeks    Merton Border, MD PCCM service Mobile [phone] Pager [phone] 08/28/2016

## 2016-08-29 ENCOUNTER — Ambulatory Visit: Payer: Medicare HMO | Admitting: Internal Medicine

## 2016-08-29 ENCOUNTER — Encounter: Payer: Self-pay | Admitting: Internal Medicine

## 2016-08-29 DIAGNOSIS — R69 Illness, unspecified: Secondary | ICD-10-CM | POA: Diagnosis not present

## 2016-08-30 ENCOUNTER — Telehealth: Payer: Self-pay | Admitting: *Deleted

## 2016-08-30 DIAGNOSIS — G4733 Obstructive sleep apnea (adult) (pediatric): Secondary | ICD-10-CM

## 2016-08-30 NOTE — Telephone Encounter (Signed)
LMOM for pt to return call for results. 

## 2016-08-30 NOTE — Telephone Encounter (Signed)
-----   Message from Laverle Hobby, MD sent at 08/18/2016  5:28 PM EDT ----- Regarding: CPAP titration recommendation Auto-titrating CPAP with pressure range 8-11 cmH2O

## 2016-08-31 NOTE — Telephone Encounter (Signed)
LMOM for pt to return call. 

## 2016-09-01 NOTE — Telephone Encounter (Signed)
LM on pt's cell asking her to call me back. Also LM on daughter's cell asking her to have pt to return my call.

## 2016-09-04 NOTE — Telephone Encounter (Signed)
Spoke with pt and she has a CPAP that has a setting of 8cm. Will place new order per DR to set on an auto setting of 8-11cm H2O. OV r/s since pt missed her appt. Nothing further needed.

## 2016-09-07 ENCOUNTER — Ambulatory Visit: Payer: Medicare HMO | Attending: Family Medicine

## 2016-09-13 ENCOUNTER — Encounter: Payer: Self-pay | Admitting: Family Medicine

## 2016-09-13 ENCOUNTER — Other Ambulatory Visit: Payer: Self-pay | Admitting: Family Medicine

## 2016-09-13 ENCOUNTER — Ambulatory Visit (INDEPENDENT_AMBULATORY_CARE_PROVIDER_SITE_OTHER): Payer: Medicare HMO | Admitting: Family Medicine

## 2016-09-13 VITALS — BP 110/70 | HR 66 | Temp 97.6°F | Ht 61.0 in | Wt 336.0 lb

## 2016-09-13 DIAGNOSIS — E1140 Type 2 diabetes mellitus with diabetic neuropathy, unspecified: Secondary | ICD-10-CM

## 2016-09-13 DIAGNOSIS — E1165 Type 2 diabetes mellitus with hyperglycemia: Secondary | ICD-10-CM

## 2016-09-13 DIAGNOSIS — E782 Mixed hyperlipidemia: Secondary | ICD-10-CM

## 2016-09-13 DIAGNOSIS — I2510 Atherosclerotic heart disease of native coronary artery without angina pectoris: Secondary | ICD-10-CM | POA: Diagnosis not present

## 2016-09-13 DIAGNOSIS — IMO0002 Reserved for concepts with insufficient information to code with codable children: Secondary | ICD-10-CM

## 2016-09-13 DIAGNOSIS — E538 Deficiency of other specified B group vitamins: Secondary | ICD-10-CM | POA: Diagnosis not present

## 2016-09-13 DIAGNOSIS — I5032 Chronic diastolic (congestive) heart failure: Secondary | ICD-10-CM | POA: Diagnosis not present

## 2016-09-13 DIAGNOSIS — E039 Hypothyroidism, unspecified: Secondary | ICD-10-CM

## 2016-09-13 DIAGNOSIS — E1151 Type 2 diabetes mellitus with diabetic peripheral angiopathy without gangrene: Secondary | ICD-10-CM | POA: Insufficient documentation

## 2016-09-13 LAB — COMPREHENSIVE METABOLIC PANEL
ALT: 23 U/L (ref 0–35)
AST: 23 U/L (ref 0–37)
Albumin: 4.2 g/dL (ref 3.5–5.2)
Alkaline Phosphatase: 70 U/L (ref 39–117)
BILIRUBIN TOTAL: 0.7 mg/dL (ref 0.2–1.2)
BUN: 13 mg/dL (ref 6–23)
CALCIUM: 9.1 mg/dL (ref 8.4–10.5)
CHLORIDE: 99 meq/L (ref 96–112)
CO2: 27 meq/L (ref 19–32)
CREATININE: 0.81 mg/dL (ref 0.40–1.20)
GFR: 75.57 mL/min (ref >60.00)
GLUCOSE: 164 mg/dL — AB (ref 70–99)
Potassium: 4.1 mEq/L (ref 3.5–5.1)
SODIUM: 136 meq/L (ref 135–145)
Total Protein: 6.9 g/dL (ref 6.0–8.3)

## 2016-09-13 LAB — HEMOGLOBIN A1C: Hgb A1c MFr Bld: 8.7 % — ABNORMAL HIGH (ref 4.6–6.5)

## 2016-09-13 LAB — LDL CHOLESTEROL, DIRECT: Direct LDL: 77 mg/dL

## 2016-09-13 LAB — T4, FREE: Free T4: 0.63 ng/dL (ref 0.60–1.60)

## 2016-09-13 LAB — TSH: TSH: 25.8 u[IU]/mL — ABNORMAL HIGH (ref 0.35–4.50)

## 2016-09-13 MED ORDER — GLIMEPIRIDE 1 MG PO TABS: 1.0000 mg | ORAL_TABLET | Freq: Every day | ORAL | 6 refills | Status: DC

## 2016-09-13 MED ORDER — CYANOCOBALAMIN 1000 MCG/ML IJ SOLN
1000.0000 ug | Freq: Once | INTRAMUSCULAR | Status: AC
  Administered 2016-09-13: 1000 ug via INTRAMUSCULAR

## 2016-09-13 NOTE — Progress Notes (Addendum)
BP 110/70   Pulse 66   Temp 97.6 F (36.4 C)   Ht 5\' 1"  (1.549 m)   Wt (!) 336 lb (152.4 kg)   SpO2 98%   BMI 63.49 kg/m    CC: f/u visit "I'm fighting fluid" Subjective:    Patient ID: [name], female    DOB: [birth year], 66 y.o.   MRN: [id number]  HPI: [name] is a 66 y.o. female presenting on 09/13/2016 for Diabetes (follow up, "pt c/o fluid retention", L knee still hurting saw ortho about 2 months ago. ); Nausea (loose stools, abd pain and feels nauseous every morning for a week and a half. ); and post nasal drip   Last seen here 12/2015.  She had catheterization 06/2016 showing minor nonobstructive CAD, mildly elevated LVEDP, and normal LV systolic function Burt Knack). She backed off aspirin - because of easy bruising.   Increased nausea and diarrhea last few weeks. Planning on returning to GI.   DM - regularly does not check sugars. Compliant with antihyperglycemic regimen which includes: metformin 850mg  bid. Denies hypoglycemic symptoms. Denies paresthesias. Last diabetic eye exam 06/2015. Has scheduled appt upcoming. Pneumovax: 2012.  Prevnar: not due yet. Lab Results  Component Value Date   HGBA1C 7.2 (H) 12/17/2015   Diabetic Foot Exam - Simple   Simple Foot Form Diabetic Foot exam was performed with the following findings:  Yes 09/13/2016 11:39 AM  Visual Inspection No deformities, no ulcerations, no other skin breakdown bilaterally:  Yes Sensation Testing Intact to touch and monofilament testing bilaterally:  Yes Pulse Check See comments:  Yes Comments Diminished DP/PT bilaterally      Relevant past medical, surgical, family and social history reviewed and updated as indicated. Interim medical history since our last visit reviewed. Allergies and medications reviewed and updated. Outpatient Medications Prior to Visit  Medication Sig Dispense Refill  . ALPRAZolam (XANAX) 0.5 MG tablet Take 0.5 mg by mouth 3 (three) times daily as needed for anxiety.    2  . atorvastatin (LIPITOR) 40 MG tablet TAKE 1 TABLET(40 MG) BY MOUTH AT BEDTIME 90 tablet 1  . cetirizine (ZYRTEC) 10 MG tablet Take 10 mg by mouth daily as needed for allergies.    . cyanocobalamin (,VITAMIN B-12,) 1000 MCG/ML injection Inject 1 mL (1,000 mcg total) into the muscle every 30 (thirty) days. 10 mL 3  . furosemide (LASIX) 40 MG tablet TAKE 1 TABLET BY MOUTH EVERY DAY 90 tablet 3  . gabapentin (NEURONTIN) 400 MG capsule Take 400-1,200 mg by mouth 3 (three) times daily. Take one capsule in the morning, one capsule 7 hours later and then three capsules at bedtime.  0  . lamoTRIgine (LAMICTAL) 200 MG tablet Take 200 mg by mouth daily.    Marland Kitchen levothyroxine (SYNTHROID, LEVOTHROID) 112 MCG tablet TAKE 2 TABLETS BY MOUTH EVERY DAY BEFORE BREAKFAST 60 tablet 6  . metFORMIN (GLUCOPHAGE) 850 MG tablet TAKE 1 TABLET BY MOUTH TWICE DAILY WITH A MEAL 180 tablet 3  . nystatin ointment (MYCOSTATIN) Apply 1 application topically daily as needed. Apply to red areas in groin 30 g 1  . omeprazole (PRILOSEC) 40 MG capsule TAKE 1 CAPSULE (40 MG TOTAL) BY MOUTH DAILY. 30 capsule 6  . potassium chloride (K-DUR) 10 MEQ tablet TAKE 2 TABLETS(20 MEQ) BY MOUTH DAILY 180 tablet 1  . ramipril (ALTACE) 10 MG capsule TAKE 1 CAPSULE BY MOUTH EVERY DAY 90 capsule 1  . rOPINIRole (REQUIP) 3 MG tablet TAKE 1 TABLET (3  MG TOTAL) BY MOUTH AT BEDTIME. 90 tablet 0  . triamcinolone cream (KENALOG) 0.1 % APPLY TO AFFECTED AREA ON EXTERNAL EAR TWICE DAILY. DO NOT USE FOR MORE THAN 2 WEEKS AT A TIME 30 g 0  . Vilazodone HCl (VIIBRYD) 40 MG TABS Take 40 mg by mouth every morning.    Marland Kitchen ibuprofen (ADVIL,MOTRIN) 200 MG tablet Take 800 mg by mouth every 6 (six) hours as needed (For pain.).     Marland Kitchen aspirin EC 81 MG tablet Take 81 mg by mouth once a week.     . nitroGLYCERIN (NITROSTAT) 0.4 MG SL tablet Place 1 tablet (0.4 mg total) under the tongue every 5 (five) minutes as needed for chest pain. 25 tablet 3  . budesonide (ENTOCORT  EC) 3 MG 24 hr capsule Take 3 capsules (9 mg total) by mouth daily. (Patient not taking: Reported on 09/13/2016) 90 capsule 3   Facility-Administered Medications Prior to Visit  Medication Dose Route Frequency Provider Last Rate Last Dose  . triamcinolone acetonide (KENALOG) 10 MG/ML injection 10 mg  10 mg Other Once Landis Martins, DPM         Per HPI unless specifically indicated in ROS section below Review of Systems     Objective:    BP 110/70   Pulse 66   Temp 97.6 F (36.4 C)   Ht 5\' 1"  (1.549 m)   Wt (!) 336 lb (152.4 kg)   SpO2 98%   BMI 63.49 kg/m   Wt Readings from Last 3 Encounters:  09/13/16 (!) 336 lb (152.4 kg)  06/23/16 (!) 329 lb (149.2 kg)  06/02/16 (!) 328 lb (148.8 kg)    Physical Exam  Constitutional: She appears well-developed and well-nourished. No distress.  HENT:  Head: Normocephalic and atraumatic.  Right Ear: External ear normal.  Left Ear: External ear normal.  Nose: Nose normal.  Mouth/Throat: Oropharynx is clear and moist. No oropharyngeal exudate.  Eyes: Conjunctivae and EOM are normal. Pupils are equal, round, and reactive to light. No scleral icterus.  Neck: Normal range of motion. Neck supple. No thyromegaly present.  Cardiovascular: Normal rate, regular rhythm, normal heart sounds and intact distal pulses.   No murmur heard. Pulmonary/Chest: Effort normal and breath sounds normal. No respiratory distress. She has no wheezes. She has no rales.  Musculoskeletal: She exhibits edema (tr).  See HPI for foot exam if done  Lymphadenopathy:    She has no cervical adenopathy.  Skin: Skin is warm and dry. No rash noted.  Psychiatric: She has a normal mood and affect.  Nursing note and vitals reviewed.  Lab Results  Component Value Date   TSH 0.47 08/16/2015       Assessment & Plan:   Problem List Items Addressed This Visit    Chronic diastolic heart failure ([place])    Endorses good effect of lasix 40mg  daily. Check labs today.        Coronary artery disease    Reviewed recent cath. Appreciate cards care of patient.       Diabetic peripheral vascular disease (HCC)    Diminished pedal pulses, and pt endorses some claudication type symptoms - will check ABI.      Relevant Orders   VAS [place] LE ART SEG MULTI (Segm&LE Reynauds)   Hyperlipidemia    Not fasting today - check d LDL, continue statin.       Relevant Orders   Comprehensive metabolic panel   LDL Cholesterol, Direct   Hypothyroidism  Update TSH, free T4. Endorses compliance with current levothyroxine regimen.       Relevant Orders   TSH   T4, free   Type 2 diabetes mellitus, uncontrolled, with neuropathy (HCC) - Primary    Chronic, has not been checking sugars. Continue metformin 850mg  bid. Check A1c today. She states she has upcoming eye exam. Foot exam today.       Relevant Orders   Hemoglobin A1c   Vitamin B12 deficiency    B12 shot today. Continues home replacement.       Relevant Medications   cyanocobalamin ((VITAMIN B-12)) injection 1,000 mcg (Completed)       Follow up plan: Return in about 4 months (around 01/14/2017) for annual exam, prior fasting for blood work, medicare wellness visit.  Ria Bush, MD

## 2016-09-13 NOTE — Assessment & Plan Note (Signed)
Not fasting today - check d LDL, continue statin.

## 2016-09-13 NOTE — Patient Instructions (Addendum)
Fasting labs today.  B12 shot today.  We will schedule arterial circulation evaluation of legs. See Rosaria Ferries on your way out.  Return as needed or in 4 months for medicare wellness visit and physical.  Good to see you today. Continue current medicines.

## 2016-09-13 NOTE — Assessment & Plan Note (Signed)
Endorses good effect of lasix 40mg  daily. Check labs today.

## 2016-09-13 NOTE — Assessment & Plan Note (Signed)
Reviewed recent cath. Appreciate cards care of patient.

## 2016-09-13 NOTE — Assessment & Plan Note (Addendum)
Chronic, has not been checking sugars. Continue metformin 850mg  bid. Check A1c today. She states she has upcoming eye exam. Foot exam today.

## 2016-09-13 NOTE — Assessment & Plan Note (Signed)
Diminished pedal pulses, and pt endorses some claudication type symptoms - will check ABI.

## 2016-09-13 NOTE — Assessment & Plan Note (Signed)
B12 shot today. Continues home replacement.

## 2016-09-13 NOTE — Addendum Note (Signed)
Addended by: Ria Bush on: 09/13/2016 11:48 AM   Modules accepted: Orders

## 2016-09-13 NOTE — Assessment & Plan Note (Signed)
Update TSH, free T4. Endorses compliance with current levothyroxine regimen.

## 2016-09-20 ENCOUNTER — Ambulatory Visit (INDEPENDENT_AMBULATORY_CARE_PROVIDER_SITE_OTHER): Payer: PRIVATE HEALTH INSURANCE | Admitting: Family Medicine

## 2016-09-20 ENCOUNTER — Ambulatory Visit (INDEPENDENT_AMBULATORY_CARE_PROVIDER_SITE_OTHER)
Admission: RE | Admit: 2016-09-20 | Discharge: 2016-09-20 | Disposition: A | Discharge status: Home / Self Care | Payer: PRIVATE HEALTH INSURANCE | Source: Ambulatory Visit | Attending: Family Medicine | Admitting: Family Medicine

## 2016-09-20 ENCOUNTER — Ambulatory Visit: Payer: Medicare HMO | Admitting: Family Medicine

## 2016-09-20 ENCOUNTER — Encounter: Payer: Self-pay | Admitting: Family Medicine

## 2016-09-20 VITALS — BP 120/78 | HR 76 | Temp 98.8°F | Wt 328.5 lb

## 2016-09-20 DIAGNOSIS — M79642 Pain in left hand: Secondary | ICD-10-CM

## 2016-09-20 DIAGNOSIS — S62327A Displaced fracture of shaft of fifth metacarpal bone, left hand, initial encounter for closed fracture: Secondary | ICD-10-CM | POA: Diagnosis not present

## 2016-09-20 NOTE — Patient Instructions (Signed)
Go to the ortho clinic.  Take care.  Glad to see you.  [facility]

## 2016-09-20 NOTE — Progress Notes (Signed)
Pre visit review using our clinic review tool, if applicable. No additional management support is needed unless otherwise documented below in the visit note. 
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
## 2016-09-21 DIAGNOSIS — S62329A Displaced fracture of shaft of unspecified metacarpal bone, initial encounter for closed fracture: Secondary | ICD-10-CM

## 2016-09-21 HISTORY — DX: Displaced fracture of shaft of unspecified metacarpal bone, initial encounter for closed fracture: S62.329A
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
## 2016-09-22 ENCOUNTER — Encounter: Payer: Self-pay | Admitting: Family Medicine

## 2016-09-24 DIAGNOSIS — S82409A Unspecified fracture of shaft of unspecified fibula, initial encounter for closed fracture: Secondary | ICD-10-CM | POA: Diagnosis not present

## 2016-09-24 DIAGNOSIS — G4733 Obstructive sleep apnea (adult) (pediatric): Secondary | ICD-10-CM | POA: Diagnosis not present

## 2016-09-26 DIAGNOSIS — R69 Illness, unspecified: Secondary | ICD-10-CM | POA: Diagnosis not present

## 2016-09-27 DIAGNOSIS — S62327A Displaced fracture of shaft of fifth metacarpal bone, left hand, initial encounter for closed fracture: Secondary | ICD-10-CM | POA: Diagnosis not present

## 2016-09-28 ENCOUNTER — Encounter: Payer: Self-pay | Admitting: Family Medicine

## 2016-10-01 ENCOUNTER — Other Ambulatory Visit: Payer: Self-pay | Admitting: Family Medicine

## 2016-10-05 ENCOUNTER — Ambulatory Visit: Payer: Medicare HMO | Admitting: Internal Medicine

## 2016-10-05 DIAGNOSIS — S62327A Displaced fracture of shaft of fifth metacarpal bone, left hand, initial encounter for closed fracture: Secondary | ICD-10-CM | POA: Diagnosis not present

## 2016-10-06 ENCOUNTER — Telehealth: Payer: Self-pay | Admitting: Cardiovascular Disease
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
## 2016-10-06 NOTE — Telephone Encounter (Signed)
Acceptable risk for surgery No further testing needed 

## 2016-10-06 NOTE — Telephone Encounter (Signed)
Cardiac clearance routed to number provided.  

## 2016-10-11 ENCOUNTER — Telehealth: Payer: Self-pay | Admitting: Cardiovascular Disease

## 2016-10-11 NOTE — Telephone Encounter (Addendum)
[facility] requests via fax, OV notes, lab tests, EKG , cardiac stress test results, cardia catheterization notes and echocardiogram. S/w Sunday Spillers, RN as Judson Roch, RN is out of the office today. Confirmed fax [phone] Per our OV notes, cardiac clearance has been routed. I have routed requested information to their office.

## 2016-10-13 HISTORY — PX: ORIF METACARPAL FRACTURE: SUR940

## 2016-10-16 DIAGNOSIS — S62327A Displaced fracture of shaft of fifth metacarpal bone, left hand, initial encounter for closed fracture: Secondary | ICD-10-CM | POA: Diagnosis not present

## 2016-10-16 DIAGNOSIS — Y999 Unspecified external cause status: Secondary | ICD-10-CM | POA: Diagnosis not present

## 2016-10-16 DIAGNOSIS — G8918 Other acute postprocedural pain: Secondary | ICD-10-CM | POA: Diagnosis not present

## 2016-10-19 DIAGNOSIS — S62327D Displaced fracture of shaft of fifth metacarpal bone, left hand, subsequent encounter for fracture with routine healing: Secondary | ICD-10-CM | POA: Diagnosis not present

## 2016-10-19 DIAGNOSIS — M79645 Pain in left finger(s): Secondary | ICD-10-CM | POA: Diagnosis not present

## 2016-10-19 DIAGNOSIS — M25649 Stiffness of unspecified hand, not elsewhere classified: Secondary | ICD-10-CM | POA: Diagnosis not present

## 2016-10-19 DIAGNOSIS — S62327A Displaced fracture of shaft of fifth metacarpal bone, left hand, initial encounter for closed fracture: Secondary | ICD-10-CM | POA: Diagnosis not present

## 2016-10-22 ENCOUNTER — Encounter: Payer: Self-pay | Admitting: Internal Medicine

## 2016-10-22 ENCOUNTER — Encounter: Payer: Self-pay | Admitting: Family Medicine

## 2016-10-23 ENCOUNTER — Encounter: Payer: Self-pay | Admitting: Family Medicine

## 2016-10-24 ENCOUNTER — Other Ambulatory Visit: Payer: Self-pay | Admitting: Internal Medicine

## 2016-10-24 ENCOUNTER — Encounter: Payer: Self-pay | Admitting: Internal Medicine

## 2016-10-24 DIAGNOSIS — K52839 Microscopic colitis, unspecified: Secondary | ICD-10-CM

## 2016-10-24 IMAGING — MR MR KNEE*L* W/O CM
5 series · 39 of 40 positions shown · non-contrast
Comparison: Radiographs 05/14/2014.  MRI 10/22/2008.

CLINICAL DATA: Generalized left knee pain and locking after falling
1 month ago. History of arthroscopic surgery years ago. Initial
encounter.

EXAM:
MRI OF THE LEFT KNEE WITHOUT CONTRAST
TECHNIQUE: Multiplanar, multisequence MR imaging of the knee was performed. No
intravenous contrast was administered.

[Series 3: PD fat-sat · axial · 3.0mm · 0.35mm/px · z∈[-43,+49]mm · 7 of 29 slices shown (1 of 3)]
[im 1/29]
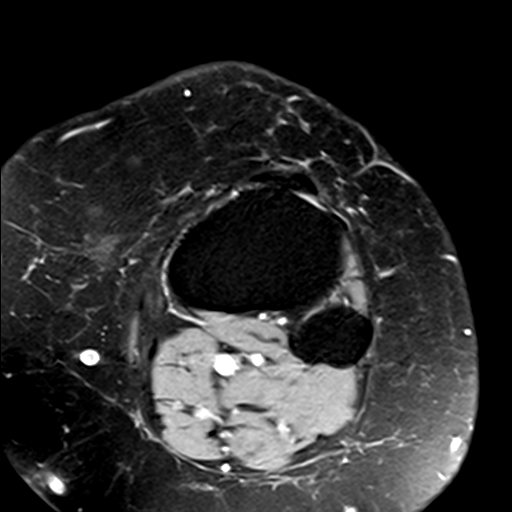
[im 5/29]
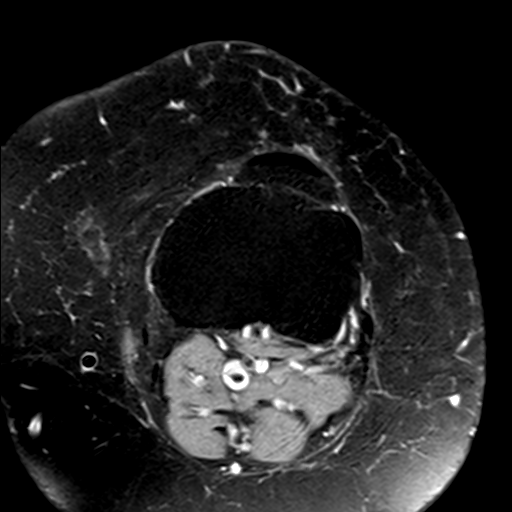
[im 10/29]
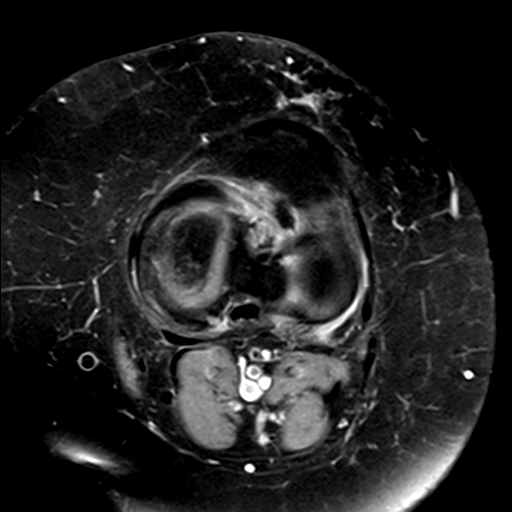
[im 15/29]
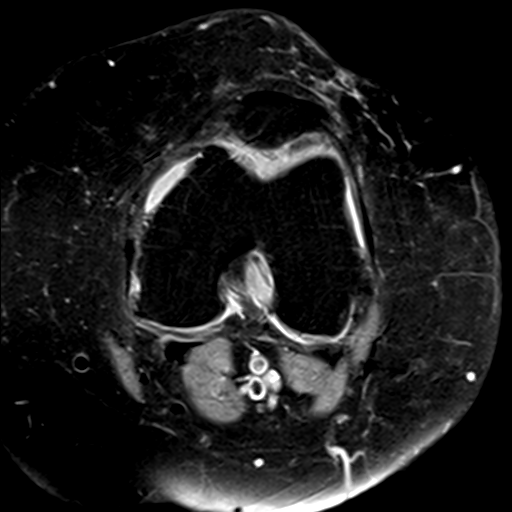
[im 19/29]
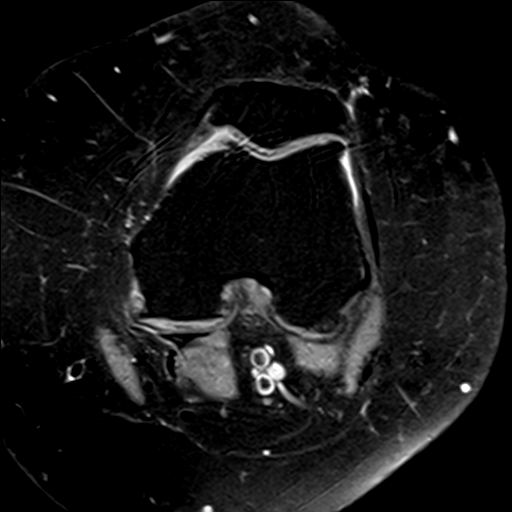
[im 24/29]
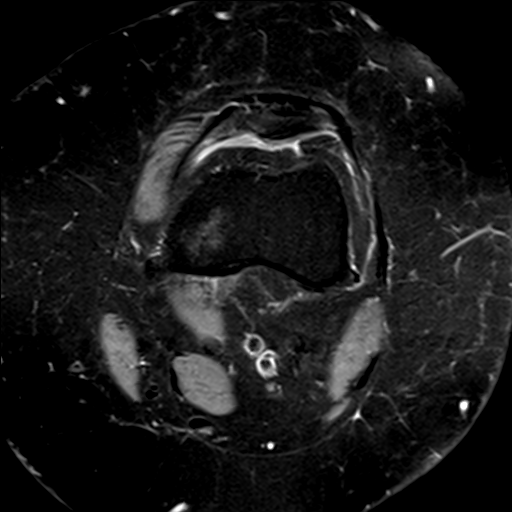
[im 29/29]
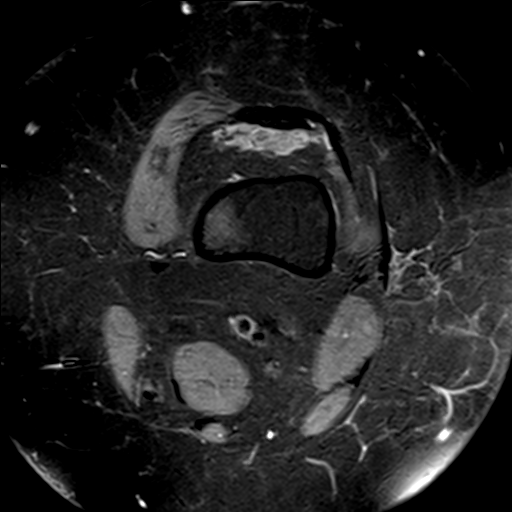

[Series 4: T1 · coronal · 3.0mm · 0.56mm/px · 7 of 31 slices shown]
[im 1/31]
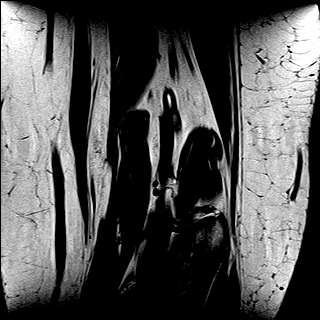
[im 5/31]
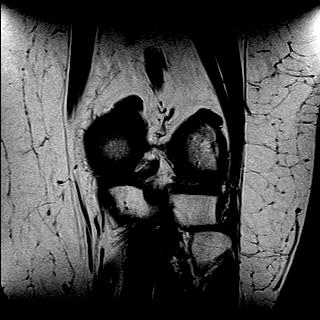
[im 9/31]
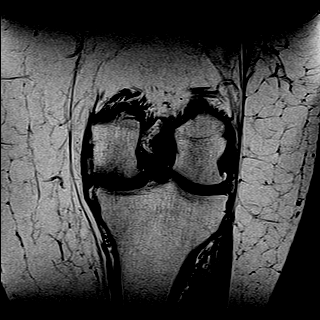
[im 13/31]
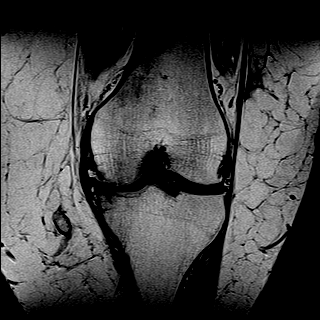
[im 18/31]
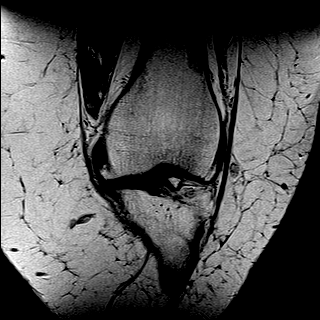
[im 22/31]
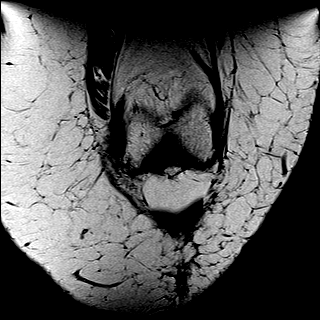
[im 26/31]
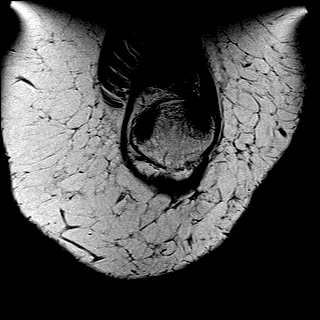

[Series 5: T2 fat-sat · coronal · 3.0mm · 0.56mm/px · 8 of 31 slices shown]
[im 1/31]
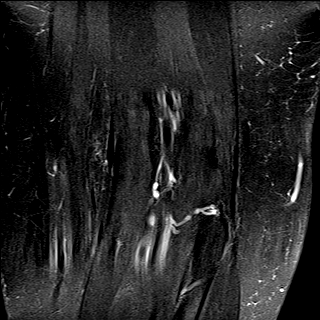
[im 5/31]
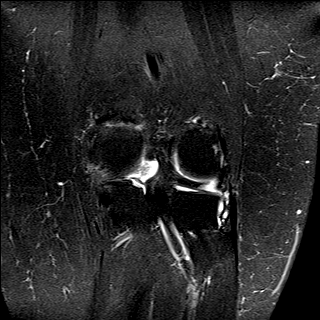
[im 9/31]
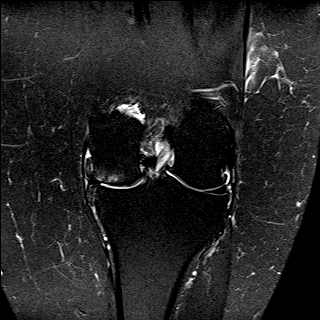
[im 13/31]
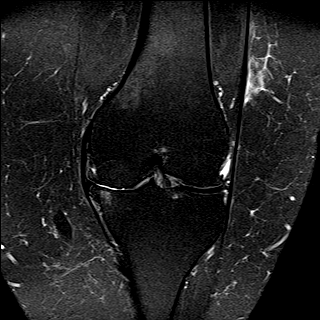
[im 18/31]
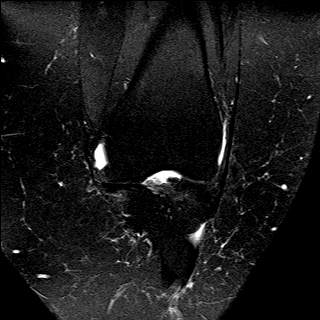
[im 22/31]
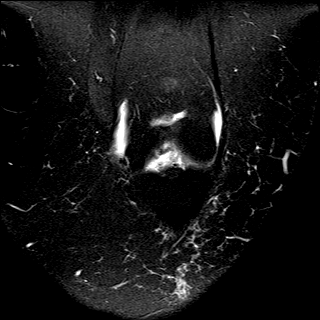
[im 26/31]
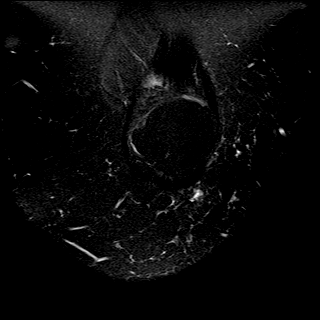
[im 31/31]
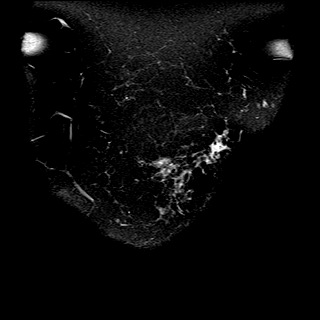

[Series 6: PD fat-sat · coronal · 3.0mm · 0.70mm/px · 8 of 31 slices shown (2 of 3)]
[im 1/31]
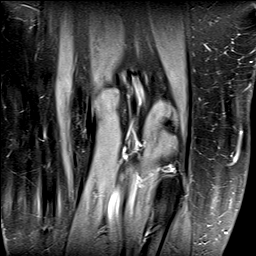
[im 5/31]
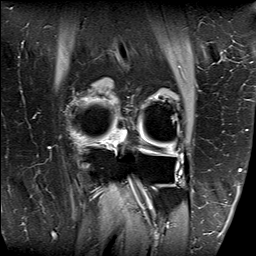
[im 9/31]
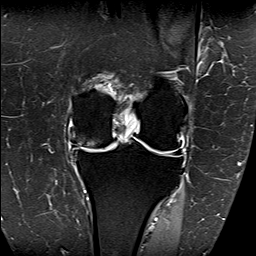
[im 13/31]
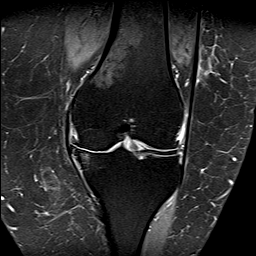
[im 18/31]
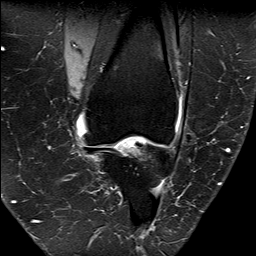
[im 22/31]
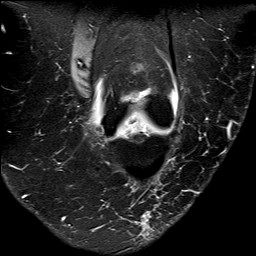
[im 26/31]
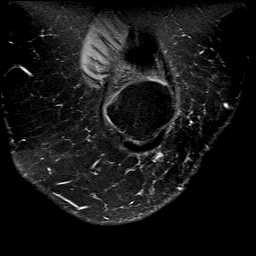
[im 31/31]
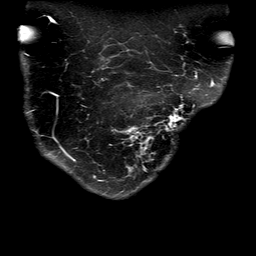

[Series 7: PD fat-sat · sagittal · 3.0mm · 0.70mm/px · 9 of 33 slices shown (3 of 3)]
[im 1/33]
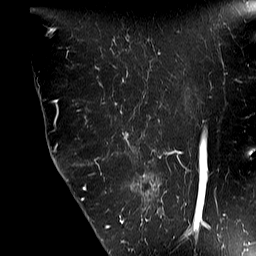
[im 5/33]
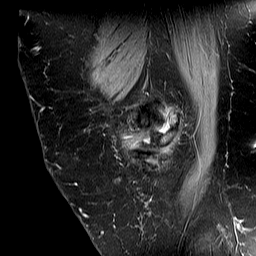
[im 9/33]
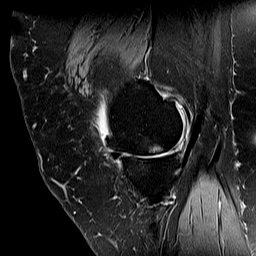
[im 13/33]
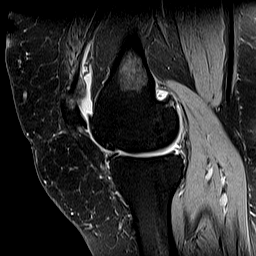
[im 17/33]
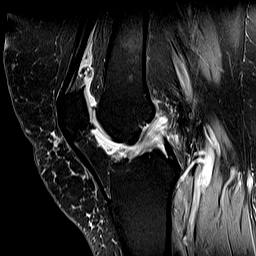
[im 21/33]
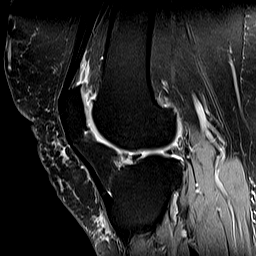
[im 25/33]
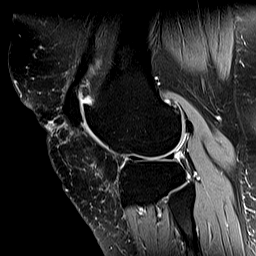
[im 29/33]
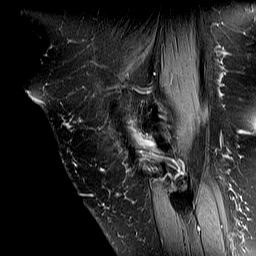
[im 33/33]
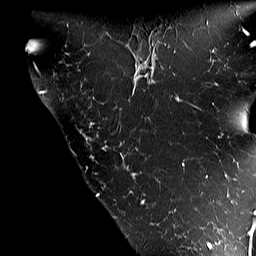

[39 of 40 positions shown; findings below may reference images not displayed]

FINDINGS: MENISCI

Medial meniscus: I do not see clear signs of interval surgery. As
noted previously, the medial meniscus is diffusely diminutive and
partially extruded from the joint, presumably secondary to previous
partial meniscectomy. There is progressive degeneration of the
meniscal remnant without definite tear or displaced meniscal
fragment. The meniscal root is absent.

Lateral meniscus:  Intact with normal morphology.

LIGAMENTS

Cruciates: Patient has developed moderate ACL mucoid degeneration.
The PCL appears normal.

Collaterals:  Intact with stable MCL degeneration.

CARTILAGE

Patellofemoral: Mildly progressive chondral thinning, surface
irregularity and osteophyte formation.

Medial: Progressive advanced chondral thinning, surface irregularity
and osteophyte formation. There is progressive subchondral cyst
formation in the medial femoral condyle.

Lateral:  Progressive chondral thinning and osteophyte formation.

OTHER

Joint: Small joint effusion. As noted on radiographs, there is a 10
mm loose body in the suprapatellar recess. In addition, there is a
possible loose body centrally in the joint, best seen on the axial
and coronal images. This may be partially embedded in Hoffa's fat.

Popliteal Fossa:  Unremarkable. No significant Baker's cyst.

Extensor Mechanism:  Intact.

Bones:  No significant extra-articular osseous findings.
IMPRESSION: 1. Progressive tricompartmental osteoarthritis, worst in the medial
compartment. Associated intra-articular loose bodies.
2. Marked degeneration of the medial meniscal remnant following
presumed remote partial meniscectomy. Appearance is similar to the
prior study.
3. ACL mucoid degeneration.
4. The lateral meniscus and other knee ligaments appear intact.

## 2016-10-24 MED ORDER — BUDESONIDE 3 MG PO CPEP: 9.0000 mg | ORAL_CAPSULE | ORAL | 1 refills | Status: DC

## 2016-10-24 NOTE — Assessment & Plan Note (Signed)
tx w/ budesnoide 9 mg tid again - she will see me in Aug 2018

## 2016-10-25 DIAGNOSIS — G4733 Obstructive sleep apnea (adult) (pediatric): Secondary | ICD-10-CM | POA: Diagnosis not present

## 2016-10-25 DIAGNOSIS — S82409A Unspecified fracture of shaft of unspecified fibula, initial encounter for closed fracture: Secondary | ICD-10-CM | POA: Diagnosis not present

## 2016-10-26 ENCOUNTER — Other Ambulatory Visit: Payer: Self-pay | Admitting: Internal Medicine

## 2016-10-26 DIAGNOSIS — K52839 Microscopic colitis, unspecified: Secondary | ICD-10-CM

## 2016-10-26 MED ORDER — PREDNISONE 10 MG PO TABS: 40.0000 mg | ORAL_TABLET | Freq: Every day | ORAL | 1 refills | Status: DC

## 2016-10-26 NOTE — Progress Notes (Signed)
I called her and will use prednisone instead of budesonide To start at 40 mg qd and she will update me in a few days and will taper. Advised watch for hyperglycemia

## 2016-10-29 NOTE — Progress Notes (Signed)
PULMONARY/SLEEP OFFICE FOLLOW UP  PROBLEMS: OSA Obesity  DATA: 01/04/16 Split night study: AHI 32/hr, lowest SpO2 67%, recommended setting 8 cm H2O with nasal pillows 11/18-12/17/17 CPAP compliance: Usage 27/30 night, >4 hrs 25 nights, <4 hrs 2 night 5/20-6/18/18 CPAP compliance. Usage 26/30 days. Greater than 4 hours on 23 days. Average usage 6 hours 28 minutes on days used. AutoSet 8-11. 95th percentile pressure 10.6. Residual AHI 2.4.  SUMMARY: 66 y.o. F with OSA first diagnosed in 2011 on CPAP since then. Significant weight gain of at least 50# since initial diagnosis. Persistent daytime hypersomnolence   INTERVAL HISTORY: No major event   SUBJ: Patient was recently seen for follow-up on objective sleep apnea, at that time she was on a CPAP of 8, she had residual symptoms of daytime sleepiness. She was therefore changed to an AutoSet of 8-11. She notes that she is feeling better with the new pressures. However she notes that there were a few nights when she did not use it.   She also had a follow-up at [facility] few days ago, and has been having pain on her left side since that time, when she breathes.   OBJ: Vitals:   11/01/16 0948 11/01/16 0951  BP:  116/70  Pulse:  69  Resp: 16   SpO2:  96%  Weight: (!) 148.8 kg (328 lb)   Height: 5\' 1"  (1.549 m)    Obese, NAD HEENT WNL, nares are widely patent, oropharynx is normal No JVD noted Chest clear to ausc and perc Reg, no M Obese, soft, +BS No C/C/E Pinpoint tenderness at left chest wall anteriorly.   BMP Latest Ref Rng & Units 09/13/2016 06/20/2016 08/16/2015  Glucose 70 - 99 mg/dL 164(H) 198(H) 164(H)  BUN 6 - 23 mg/dL 13 15 20   Creatinine 0.40 - 1.20 mg/dL 0.81 0.84 0.74  Sodium 135 - 145 mEq/L 136 141 142  Potassium 3.5 - 5.1 mEq/L 4.1 4.0 4.1  Chloride 96 - 112 mEq/L 99 101 103  CO2 19 - 32 mEq/L 27 29 29   Calcium 8.4 - 10.5 mg/dL 9.1 8.9 9.3   CBC Latest Ref Rng & Units 06/20/2016 08/16/2015 10/23/2014  WBC 4.0 -  10.5 K/uL 5.2 5.7 5.7  Hemoglobin 12.0 - 15.0 g/dL 12.8 13.5 13.8  Hematocrit 36.0 - 46.0 % 38.9 40.4 42.0  Platelets 150.0 - 400.0 K/uL 252.0 215.0 221.0   No recent CXR   DATA: Compliance report as above  IMPRESSION: 1) Moderate OSA with severe desaturation @ baseline 2) Obesity  3) Left chest wall bruise with pinpoint tenderness. Asked to take OTC meds such as motrin or aleve.   PLAN: 1) Continue to use CPAP every night.  2) Weight loss would be beneficial.     Marda Stalker, MD PCCM service Pager [phone] 11/01/2016

## 2016-10-30 ENCOUNTER — Encounter: Payer: Self-pay | Admitting: Internal Medicine

## 2016-11-01 ENCOUNTER — Ambulatory Visit (INDEPENDENT_AMBULATORY_CARE_PROVIDER_SITE_OTHER): Payer: Medicare HMO | Admitting: Internal Medicine

## 2016-11-01 ENCOUNTER — Encounter: Payer: Self-pay | Admitting: Internal Medicine

## 2016-11-01 VITALS — BP 116/70 | HR 69 | Resp 16 | Ht 61.0 in | Wt 328.0 lb

## 2016-11-01 DIAGNOSIS — G4733 Obstructive sleep apnea (adult) (pediatric): Secondary | ICD-10-CM | POA: Diagnosis not present

## 2016-11-01 NOTE — Patient Instructions (Signed)
  Sleep Apnea Sleep apnea is disorder that affects a person's sleep. A person with sleep apnea has abnormal pauses in their breathing when they sleep. It is hard for them to get a good sleep. This makes a person tired during the day. It also can lead to other physical problems. There are three types of sleep apnea. One type is when breathing stops for a short time because your airway is blocked (obstructive sleep apnea). Another type is when the brain sometimes fails to give the normal signal to breathe to the muscles that control your breathing (central sleep apnea). The third type is a combination of the other two types. HOME CARE   Take all medicine as told by your doctor.  Avoid alcohol, calming medicines (sedatives), and depressant drugs.  Try to lose weight if you are overweight. Talk to your doctor about a healthy weight goal.  Your doctor may have you use a device that helps to open your airway. It can help you get the air that you need. It is called a positive airway pressure (PAP) device.   MAKE SURE YOU:   Understand these instructions.  Will watch your condition.  Will get help right away if you are not doing well or get worse.  It may take approximately 1 month for you to get used to wearing her CPAP every night.  Be sure to work with your machine to get used to it, be patient, it may take time! 

## 2016-11-07 ENCOUNTER — Ambulatory Visit: Payer: Medicare HMO | Admitting: Family Medicine

## 2016-11-16 NOTE — Progress Notes (Deleted)
Cardiology Office Note  Date:  11/16/2016   ID:  [name], DOB Oct 13, 1950, MRN [id number]  PCP:  Ria Bush, MD   No chief complaint on file.   HPI:  Ms Brannick is a very pleasant 66 year old woman with history of obesity, hyperlipidemia,  hypothyroidism,  diabetes  Long smoking history for 30 years, stopped at age 51. She has sleep apnea, wears CPAP,  restless leg syndrome who presents for routine followup of her hyperlipidemia, diabetes as well as chest pain    In follow-up today she reports having Pain across her back yesterday This Am, CP x2 woke her, lasting  15 min  Took aspirin5  Symptoms resolved but she had continued very mild chest Pressure for a few hours  Previous cath   results reviewed with her from 2014 showing : 30 to 50% ostial diagonal 07/2012. 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  EKG on today's visit shows normal sinus rhythm with rate 64 bpm, no significant ST or T-wave changes  Other past medical history  evaluated in the hospital July 2014 for chest pain. Cardiac enzymes negative, stress test also showed no ischemia. Previous episode of diastolic CHF. Prior echocardiogram in 2013 showing normal ejection fraction.   PMH:   has a past medical history of Anxiety; Arthritis; C. 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  PSH:    Past Surgical History:  Procedure Laterality Date  . CARDIAC CATHETERIZATION  10/12/09   EF 70%  . CARDIAC CATHETERIZATION  08/23/2011   Mild nonobstructive CAD, normal LV fxn  . CARDIAC CATHETERIZATION  06/2016   mild nonobstructive CAD Burt Knack)  . COLONOSCOPY WITH PROPOFOL N/A 12/21/2015   HP, focal chronic active colitis [name], MD)  . ESOPHAGOGASTRODUODENOSCOPY  2006  . KNEE SURGERY Left 11-02-2008   Partial medial and lateral Menisectomies and Chondroplasty (Dr. [name])  . LEFT HEART CATH AND CORONARY ANGIOGRAPHY N/A 06/23/2016   Procedure: Left Heart Cath and Coronary Angiography;  Surgeon: Sherren Mocha, MD;  Location: [facility];  Service: Cardiovascular;  Laterality: N/A;  . LEFT HEART CATHETERIZATION WITH CORONARY ANGIOGRAM N/A 08/23/2011   Procedure: LEFT HEART CATHETERIZATION WITH CORONARY ANGIOGRAM;  Surgeon: Sherren Mocha, MD;  Location[facility] CATH LAB;  Service: Cardiovascular;  Laterality: N/A;  . MR ABDOMEN  01/16/2011   Left 4cm adrenal myelolipoma  . ORIF METACARPAL FRACTURE Left 10/2016   [name]  . OSTEOTOMY AND ULNAR SHORTENING  2001   [name]  . US ECHOCARDIOGRAPHY  08/2011   EF of 55-60% and indeterminate diastolic function  . VENTRAL HERNIA REPAIR  03/01/2012   Procedure: LAPAROSCOPIC VENTRAL HERNIA;  Surgeon: Ralene Ok, MD;  Location: [facility];  Service: General;  Laterality: N/A;    Current Outpatient Prescriptions  Medication Sig Dispense Refill  . ALPRAZolam (XANAX) 0.5 MG tablet Take 0.5 mg by mouth 3 (three) times daily as needed for  anxiety.   2  . aspirin EC 81 MG tablet Take 81 mg by mouth once a week.     Marland Kitchen atorvastatin (LIPITOR) 40 MG tablet TAKE 1 TABLET(40 MG) BY MOUTH AT BEDTIME 90 tablet 1  . cyanocobalamin (,VITAMIN B-12,) 1000 MCG/ML injection Inject 1  mL (1,000 mcg total) into the muscle every 30 (thirty) days. 10 mL 3  . furosemide (LASIX) 40 MG tablet TAKE 1 TABLET BY MOUTH EVERY DAY 90 tablet 3  . gabapentin (NEURONTIN) 400 MG capsule Take 400-1,200 mg by mouth 3 (three) times daily. Take one capsule in the morning, one capsule 7 hours later and then three capsules at bedtime.  0  . glimepiride (AMARYL) 1 MG tablet Take 1 tablet (1 mg total) by mouth daily with breakfast. 30 tablet 6  . lamoTRIgine (LAMICTAL) 200 MG tablet Take 200 mg by mouth daily.    Marland Kitchen levothyroxine (SYNTHROID, LEVOTHROID) 112 MCG tablet TAKE 2 TABLETS BY MOUTH EVERY DAY BEFORE BREAKFAST 60 tablet 6  . metFORMIN (GLUCOPHAGE) 850 MG tablet TAKE 1 TABLET BY MOUTH TWICE DAILY WITH A MEAL 180 tablet 3  . nitroGLYCERIN (NITROSTAT) 0.4 MG SL tablet Place 1 tablet (0.4 mg total) under the tongue every 5 (five) minutes as needed for chest pain. 25 tablet 3  . nystatin ointment (MYCOSTATIN) Apply 1 application topically daily as needed. Apply to red areas in groin 30 g 1  . omeprazole (PRILOSEC) 40 MG capsule TAKE 1 CAPSULE (40 MG TOTAL) BY MOUTH DAILY. 30 capsule 6  . potassium chloride (K-DUR) 10 MEQ tablet TAKE 2 TABLETS(20 MEQ) BY MOUTH DAILY 180 tablet 1  . predniSONE (DELTASONE) 10 MG tablet Take 4 tablets (40 mg total) by mouth daily with breakfast. 120 tablet 1  . ramipril (ALTACE) 10 MG capsule TAKE 1 CAPSULE BY MOUTH EVERY DAY 90 capsule 1  . rOPINIRole (REQUIP) 3 MG tablet TAKE 1 TABLET (3 MG TOTAL) BY MOUTH AT BEDTIME. 90 tablet 0  . triamcinolone cream (KENALOG) 0.1 % APPLY TO AFFECTED AREA ON EXTERNAL EAR TWICE DAILY. DO NOT USE FOR MORE THAN 2 WEEKS AT A TIME 30 g 0  . Vilazodone HCl (VIIBRYD) 40 MG TABS Take 40 mg by mouth every morning.     Current Facility-Administered Medications  Medication Dose Route Frequency Provider Last Rate Last Dose  . triamcinolone acetonide (KENALOG) 10 MG/ML injection 10 mg  10 mg Other Once Landis Martins, DPM         Allergies:    Azithromycin; Augmentin [amoxicillin-pot clavulanate]; Erythromycin base; and Neomycin   Social History:  The patient  reports that she quit smoking about 19 years ago. She has never used smokeless tobacco. She reports that she does not drink alcohol or use drugs.   Family History:   family history includes Coronary artery disease (age of onset: 31) in her mother; Diabetes in her mother; Heart attack (age of onset: 78) in her brother; Heart attack (age of onset: 43) in her father; Stroke in her mother.    Review of Systems: Review of Systems  Constitutional: Negative.   Respiratory: Negative.   Cardiovascular: Positive for chest pain.  Gastrointestinal: Negative.   Musculoskeletal: Negative.   Neurological: Negative.   Psychiatric/Behavioral: Negative.   All other systems reviewed and are negative.    PHYSICAL EXAM: VS:  There were no vitals taken for this visit. , BMI There is no height or weight on file to calculate BMI. GEN: Well nourished, well developed, in no acute  distress , obese HEENT: normal  Neck: no JVD, carotid bruits, or masses Cardiac: RRR; no murmurs, rubs, or gallops,no edema  Respiratory:  clear to auscultation bilaterally, normal work of breathing GI: soft, nontender, nondistended, + BS MS: no deformity or atrophy  Skin: warm and dry, no rash Neuro:  Strength and sensation are intact Psych: euthymic mood, full affect    Recent Labs: 06/20/2016: Hemoglobin 12.8; Platelets 252.0 09/13/2016: ALT 23; BUN 13; Creatinine, Ser 0.81; Potassium 4.1; Sodium 136; TSH 25.80    Lipid Panel Lab Results  Component Value Date   CHOL 248 (H) 12/17/2015   HDL 56.10 12/17/2015   LDLCALC 158 (H) 12/17/2015   TRIG 172.0 (H) 12/17/2015      Wt Readings from Last 3 Encounters:  11/01/16 (!) 328 lb (148.8 kg)  09/20/16 (!) 328 lb 8 oz (149 kg)  09/13/16 (!) 336 lb (152.4 kg)       ASSESSMENT AND PLAN:  Coronary artery disease of native artery of native heart with  stable angina pectoris ([place]) - Plan: EKG 12-Lead, NM Myocar Multi W/Spect W/Wall Motion / EF Previous cardiac catheterization results discussed with her  Chest pain, unspecified type - Plan: EKG 12-Lead, NM Myocar Multi W/Spect W/Wall Motion / EF Chest pain presenting this morning, etiology unclear Prior history of coronary disease noted in diagonal vessel in 2014. Some typical as well as atypical features.  Long discussion concerning first treatment options including stress testing/Myoview or CT coronary calcium scoring for risk stratification. We did discuss echocardiogram and what this will show. She prefers stress testing given the severity of her pain  Mixed hyperlipidemia Encouraged her to stay on her Lipitor 40 mg daily Previously total cholesterol 150 when she is taking the medication  Chronic diastolic heart failure (HCC) Currently takes Lasix daily, appears relatively euvolemic on exam  Type 2 diabetes mellitus, uncontrolled, with neuropathy ([place]) We have encouraged continued exercise, careful diet management in an effort to lose weight.  OSA on CPAP Reports that she is compliant with her CPAP She is indicated she is scheduled for further testing as she has fatigue, not sleeping well  Morbid obesity Recommended regular exercise program, low carbohydrate diet   Total encounter time more than 25 minutes  Greater than 50% was spent in counseling and coordination of care with the patient   Disposition:   F/U  6 months   No orders of the defined types were placed in this encounter.    Signed, Esmond Plants, M.D., Ph.D. 11/16/2016  [place], [place]

## 2016-11-17 ENCOUNTER — Ambulatory Visit: Payer: Medicare HMO | Admitting: Cardiovascular Disease

## 2016-11-21 DIAGNOSIS — R69 Illness, unspecified: Secondary | ICD-10-CM | POA: Diagnosis not present

## 2016-11-24 DIAGNOSIS — G4733 Obstructive sleep apnea (adult) (pediatric): Secondary | ICD-10-CM | POA: Diagnosis not present

## 2016-11-24 DIAGNOSIS — S82409A Unspecified fracture of shaft of unspecified fibula, initial encounter for closed fracture: Secondary | ICD-10-CM | POA: Diagnosis not present

## 2016-11-28 DIAGNOSIS — S62327D Displaced fracture of shaft of fifth metacarpal bone, left hand, subsequent encounter for fracture with routine healing: Secondary | ICD-10-CM | POA: Diagnosis not present

## 2016-12-03 ENCOUNTER — Encounter: Payer: Self-pay | Admitting: Family Medicine

## 2016-12-03 ENCOUNTER — Other Ambulatory Visit: Payer: Self-pay | Admitting: Family Medicine

## 2016-12-07 ENCOUNTER — Other Ambulatory Visit: Payer: Medicare HMO

## 2016-12-13 ENCOUNTER — Telehealth: Payer: Self-pay | Admitting: Cardiovascular Disease

## 2016-12-13 NOTE — Telephone Encounter (Signed)
Pt states she is having problems "getting my fluid down". Denies SOB.

## 2016-12-13 NOTE — Telephone Encounter (Signed)
Edema either going to be from fluid overload and will respond to more lasix Or Will be dependant edema secondary to venous issue, from sitting with legs down, exacerbated by heat and sitting/standing  Would do both,  Would continue lasix, decrease salt and fluid intake for a few more days And use ace wraps or compressons, leg elevation when sitting Compression when in the car, long trips

## 2016-12-13 NOTE — Telephone Encounter (Signed)
Patient calling because she is having some swelling to her feet and it is causing her pain to walk and hurts when she tries to bend her ankles. She states that she has been taking 2 pills of furosemide daily for 3 days now and still has no relief. She currently denies any shortness of breath but just having discomfort to bilateral feet. She did not have any daily weights, no blood pressures, and just reports that she has had increase in weight by 6 pounds. She thought it was 330's to 340's but does not have an exact weight to give me. Advised her to try compression hose/socks and to elevate legs when sitting. She stated that she refuses to wear compression hose/socks because it is so hot. Reviewed diet restrictions and fluid intake limits as well with her. She verbalized understanding but states that she just doesn't know what else to do. Let her know that I would forward this to Dr. Rockey Situ for his review.

## 2016-12-14 NOTE — Telephone Encounter (Signed)
Spoke with patient and reviewed Dr. Donivan Scull recommendations. Reviewed the importance of trying compression hose to help with the dependant edema and she states that she would try them. Instructed her to avoid salt and fluid intake and to monitor her daily weights. She verbalized understanding of our conversation, agreement with plan, and had no further questions at this time.

## 2016-12-19 ENCOUNTER — Other Ambulatory Visit: Payer: Self-pay | Admitting: Family Medicine

## 2016-12-20 NOTE — Telephone Encounter (Signed)
Last filled 10/02/16 #90-- please advise

## 2016-12-26 ENCOUNTER — Ambulatory Visit: Payer: Medicare HMO | Admitting: Internal Medicine

## 2017-01-01 ENCOUNTER — Telehealth: Payer: Self-pay | Admitting: Cardiovascular Disease

## 2017-01-01 ENCOUNTER — Other Ambulatory Visit: Payer: Self-pay | Admitting: Family Medicine

## 2017-01-01 DIAGNOSIS — Z1231 Encounter for screening mammogram for malignant neoplasm of breast: Secondary | ICD-10-CM

## 2017-01-01 NOTE — Telephone Encounter (Signed)
New Message     Pt would like to switch from Dr Rockey Situ to Dr Burt Knack because Dr Burt Knack is her sister doctor and he did her heart cath also

## 2017-01-01 NOTE — Telephone Encounter (Signed)
I'm ok to see her but please inform her that my office availability is not very good because majority of my time is in the hospital - most of her visits with be with my PA/NP. thx

## 2017-01-01 NOTE — Telephone Encounter (Signed)
Fine with me TG

## 2017-01-16 ENCOUNTER — Other Ambulatory Visit: Payer: Self-pay | Admitting: Family Medicine

## 2017-01-16 DIAGNOSIS — E1165 Type 2 diabetes mellitus with hyperglycemia: Secondary | ICD-10-CM

## 2017-01-16 DIAGNOSIS — E1140 Type 2 diabetes mellitus with diabetic neuropathy, unspecified: Secondary | ICD-10-CM

## 2017-01-16 DIAGNOSIS — IMO0002 Reserved for concepts with insufficient information to code with codable children: Secondary | ICD-10-CM

## 2017-01-16 DIAGNOSIS — E039 Hypothyroidism, unspecified: Secondary | ICD-10-CM

## 2017-01-16 DIAGNOSIS — E782 Mixed hyperlipidemia: Secondary | ICD-10-CM

## 2017-01-16 DIAGNOSIS — E538 Deficiency of other specified B group vitamins: Secondary | ICD-10-CM

## 2017-01-17 ENCOUNTER — Telehealth: Payer: Self-pay

## 2017-01-17 ENCOUNTER — Ambulatory Visit (INDEPENDENT_AMBULATORY_CARE_PROVIDER_SITE_OTHER): Payer: Medicare HMO

## 2017-01-17 VITALS — BP 108/76 | HR 70 | Temp 97.9°F | Ht 65.0 in | Wt 336.0 lb

## 2017-01-17 DIAGNOSIS — E039 Hypothyroidism, unspecified: Secondary | ICD-10-CM | POA: Diagnosis not present

## 2017-01-17 DIAGNOSIS — Z114 Encounter for screening for human immunodeficiency virus [HIV]: Secondary | ICD-10-CM | POA: Diagnosis not present

## 2017-01-17 DIAGNOSIS — E538 Deficiency of other specified B group vitamins: Secondary | ICD-10-CM | POA: Diagnosis not present

## 2017-01-17 DIAGNOSIS — E1165 Type 2 diabetes mellitus with hyperglycemia: Secondary | ICD-10-CM

## 2017-01-17 DIAGNOSIS — E782 Mixed hyperlipidemia: Secondary | ICD-10-CM | POA: Diagnosis not present

## 2017-01-17 DIAGNOSIS — IMO0002 Reserved for concepts with insufficient information to code with codable children: Secondary | ICD-10-CM

## 2017-01-17 DIAGNOSIS — E1140 Type 2 diabetes mellitus with diabetic neuropathy, unspecified: Secondary | ICD-10-CM | POA: Diagnosis not present

## 2017-01-17 DIAGNOSIS — Z0000 Encounter for general adult medical examination without abnormal findings: Secondary | ICD-10-CM | POA: Diagnosis not present

## 2017-01-17 DIAGNOSIS — R69 Illness, unspecified: Secondary | ICD-10-CM | POA: Diagnosis not present

## 2017-01-17 LAB — BASIC METABOLIC PANEL
BUN: 16 mg/dL (ref 6–23)
CALCIUM: 9.3 mg/dL (ref 8.4–10.5)
CO2: 28 mEq/L (ref 19–32)
CREATININE: 0.79 mg/dL (ref 0.40–1.20)
Chloride: 102 mEq/L (ref 96–112)
GFR: 77.7 mL/min (ref >60.00)
Glucose, Bld: 212 mg/dL — ABNORMAL HIGH (ref 70–99)
Potassium: 4.2 mEq/L (ref 3.5–5.1)
Sodium: 141 mEq/L (ref 135–145)

## 2017-01-17 LAB — LIPID PANEL
CHOL/HDL RATIO: 3
Cholesterol: 133 mg/dL (ref 0–200)
HDL: 40.9 mg/dL (ref >39.00)
LDL CALC: 56 mg/dL (ref 0–99)
NonHDL: 92.55
TRIGLYCERIDES: 182 mg/dL — AB (ref 0.0–149.0)
VLDL: 36.4 mg/dL (ref 0.0–40.0)

## 2017-01-17 LAB — T4, FREE: Free T4: 0.97 ng/dL (ref 0.60–1.60)

## 2017-01-17 LAB — HEMOGLOBIN A1C: HEMOGLOBIN A1C: 8.3 % — AB (ref 4.6–6.5)

## 2017-01-17 LAB — VITAMIN B12: Vitamin B-12: 182 pg/mL — ABNORMAL LOW (ref 211–911)

## 2017-01-17 LAB — TSH: TSH: 1.27 u[IU]/mL (ref 0.35–4.50)

## 2017-01-17 NOTE — Progress Notes (Signed)
PCP notes:   Health maintenance:  Mammogram - appt scheduled 01/18/17 Eye exam - appt scheduled 01/23/17 Flu vaccine - addressed HIV screening - will complete with future labs; future order generated PAP smear - patient declined A1C - completed  Abnormal screenings:   Fall risk - hx of fall with injury and no medical treatment Depression score: 2  Patient concerns:   Patient has large rash under both breasts. Patient states Kenalog ointment is not effective.   Nurse concerns:  None  Next PCP appt:   01/22/17 @ 0930

## 2017-01-17 NOTE — Telephone Encounter (Signed)
During AWV, patient stated she was placing Kenalog cream on rash under breast and it was ineffective. Consulted with PCP regarding patient's rash under her breast. Per PCP, patient was advised to discontinue using Kenalog cream on rash under breast. Also advised her to use Lotrimin OTC or Nystatin prescription until seen by PCP. Patient verbalized understanding.

## 2017-01-17 NOTE — Patient Instructions (Signed)
[name] , Thank you for taking time to come for your Medicare Wellness Visit. I appreciate your ongoing commitment to your health goals. Please review the following plan we discussed and let me know if I can assist you in the future.   These are the goals we discussed: Goals    . Increase water intake          Starting 01/17/2017, I will attempt to drink at least 6-8 glasses of water daily. Also, I want to resume swimming for exercise.        This is a list of the screening recommended for you and due dates:  Health Maintenance  Topic Date Due  . Mammogram  02/11/2017*  . Eye exam for diabetics  06/14/2017*  . Flu Shot  08/12/2017*  . HIV Screening  01/17/2018*  . Pap Smear  01/17/2049*  . Hemoglobin A1C  07/17/2017  . Complete foot exam   09/13/2017  . DTaP/Tdap/Td vaccine (2 - Td) 01/28/2022  . Tetanus Vaccine  01/28/2022  . Colon Cancer Screening  12/20/2025  .  Hepatitis C: One time screening is recommended by Center for Disease Control  (CDC) for  adults born from [birth year] through 1964.   Completed  *Topic was postponed. The date shown is not the original due date.   Preventive Care for Adults  A healthy lifestyle and preventive care can promote health and wellness. Preventive health guidelines for adults include the following key practices.  . A routine yearly physical is a good way to check with your health care provider about your health and preventive screening. It is a chance to share any concerns and updates on your health and to receive a thorough exam.  . Visit your dentist for a routine exam and preventive care every 6 months. Brush your teeth twice a day and floss once a day. Good oral hygiene prevents tooth decay and gum disease.  . The frequency of eye exams is based on your age, health, family medical history, use  of contact lenses, and other factors. Follow your health care provider's ecommendations for frequency of eye exams.  . Eat a healthy diet. Foods like  vegetables, fruits, whole grains, low-fat dairy products, and lean protein foods contain the nutrients you need without too many calories. Decrease your intake of foods high in solid fats, added sugars, and salt. Eat the right amount of calories for you. Get information about a proper diet from your health care provider, if necessary.  . Regular physical exercise is one of the most important things you can do for your health. Most adults should get at least 150 minutes of moderate-intensity exercise (any activity that increases your heart rate and causes you to sweat) each week. In addition, most adults need muscle-strengthening exercises on 2 or more days a week.  Silver Sneakers may be a benefit available to you. To determine eligibility, you may visit the website: www.silversneakers.com or contact program at 3303202679 Mon-Fri between 8AM-8PM.   . Maintain a healthy weight. The body mass index (BMI) is a screening tool to identify possible weight problems. It provides an estimate of body fat based on height and weight. Your health care provider can find your BMI and can help you achieve or maintain a healthy weight.   For adults 20 years and older: ? A BMI below 18.5 is considered underweight. ? A BMI of 18.5 to 24.9 is normal. ? A BMI of 25 to 29.9 is considered overweight. ? A BMI  of 30 and above is considered obese.   . Maintain normal blood lipids and cholesterol levels by exercising and minimizing your intake of saturated fat. Eat a balanced diet with plenty of fruit and vegetables. Blood tests for lipids and cholesterol should begin at age 48 and be repeated every 5 years. If your lipid or cholesterol levels are high, you are over 50, or you are at high risk for heart disease, you may need your cholesterol levels checked more frequently. Ongoing high lipid and cholesterol levels should be treated with medicines if diet and exercise are not working.  . If you smoke, find out from your  health care provider how to quit. If you do not use tobacco, please do not start.  . If you choose to drink alcohol, please do not consume more than 2 drinks per day. One drink is considered to be 12 ounces (355 mL) of beer, 5 ounces (148 mL) of wine, or 1.5 ounces (44 mL) of liquor.  . If you are 30-63 years old, ask your health care provider if you should take aspirin to prevent strokes.  . Use sunscreen. Apply sunscreen liberally and repeatedly throughout the day. You should seek shade when your shadow is shorter than you. Protect yourself by wearing long sleeves, pants, a wide-brimmed hat, and sunglasses year round, whenever you are outdoors.  . Once a month, do a whole body skin exam, using a mirror to look at the skin on your back. Tell your health care provider of new moles, moles that have irregular borders, moles that are larger than a pencil eraser, or moles that have changed in shape or color.

## 2017-01-17 NOTE — Progress Notes (Signed)
Subjective:   [name] is a 66 y.o. female who presents for Medicare Annual (Subsequent) preventive examination.  Review of Systems:  N/A Cardiac Risk Factors include: obesity (BMI >30kg/m2);diabetes mellitus;dyslipidemia     Objective:     Vitals: BP 108/76 (BP Location: Right Wrist, Patient Position: Sitting, Cuff Size: Normal)   Pulse 70   Temp 97.9 F (36.6 C) (Oral)   Ht 5\' 5"  (1.651 m) Comment: no shoes  Wt (!) 336 lb (152.4 kg)   SpO2 94%   BMI 55.91 kg/m   Body mass index is 55.91 kg/m.   Tobacco History  Smoking Status  . Former Smoker  . Quit date: 08/08/1997  Smokeless Tobacco  . Never Used     Counseling given: No   Past Medical History:  Diagnosis Date  . Anxiety   . Arthritis   . C. difficile colitis 02/13/2014  . CAD (coronary artery disease)    nonobstructive plaque by cath 2013  . Chronic diastolic CHF (congestive heart failure) ([place]) 08/2011   EF 55-60%, nl valves  . Colitis   . Complication of anesthesia    hard time waking up 02 sats 85-89% range   . Depression   . Fatty liver 2006   [place]  . Fibula fracture   . GERD (gastroesophageal reflux disease)   . Hyperlipidemia   . Hypersomnia with sleep apnea, unspecified 01/08/2014  . Hypertension   . Hypothyroidism   . Microscopic colitis 01/09/2014  . Morbid obesity ([place])    seen dietician 09/2011  . Myelolipoma 10/2010   stable L adrenal myelolipoma  . 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 OSA (obstructive sleep apnea)    medium quattro full face mask CPAP 10cm  . Osteoarthritis of left knee 2016   nontraumatic loose body with locking, primary localized OA [name])  . Rectal polyp   . RLS (restless legs syndrome)   . T2DM (type 2 diabetes mellitus) ([place])   . Vitamin B 12 deficiency    IF negative  . Vitamin D deficiency    last check 70 (11/2010)   Past Surgical History:  Procedure Laterality Date  . CARDIAC CATHETERIZATION  10/12/09   EF 70%  . CARDIAC  CATHETERIZATION  08/23/2011   Mild nonobstructive CAD, normal LV fxn  . CARDIAC CATHETERIZATION  06/2016   mild nonobstructive CAD Burt Knack)  . COLONOSCOPY WITH PROPOFOL N/A 12/21/2015   HP, focal chronic active colitis [name], MD)  . ESOPHAGOGASTRODUODENOSCOPY  2006  . KNEE SURGERY Left 11-02-2008   Partial medial and lateral Menisectomies and Chondroplasty (Dr. [name])  . LEFT HEART CATH AND CORONARY ANGIOGRAPHY N/A 06/23/2016   Procedure: Left Heart Cath and Coronary Angiography;  Surgeon: Sherren Mocha, MD;  Location: [facility];  Service: Cardiovascular;  Laterality: N/A;  . LEFT HEART CATHETERIZATION WITH CORONARY ANGIOGRAM N/A 08/23/2011   Procedure: LEFT HEART CATHETERIZATION WITH CORONARY ANGIOGRAM;  Surgeon: Sherren Mocha, MD;  Location: [facility];  Service: Cardiovascular;  Laterality: N/A;  . MR ABDOMEN  01/16/2011   Left 4cm adrenal myelolipoma  . ORIF METACARPAL FRACTURE Left 10/2016   [name]  . OSTEOTOMY AND ULNAR SHORTENING  2001   [name]  . US ECHOCARDIOGRAPHY  08/2011   EF of 55-60% and indeterminate diastolic function  . VENTRAL HERNIA REPAIR  03/01/2012   Procedure: LAPAROSCOPIC VENTRAL HERNIA;  Surgeon: Ralene Ok, MD;  Location: [facility];  Service: General;  Laterality: N/A;  Family History  Problem Relation Age of Onset  . Coronary artery disease Mother 38       14 angioplasty and 5v CABG  . Stroke Mother   . Diabetes Mother   . Heart attack Father 20  . Heart attack Brother 12       deceased age 58  . Colon cancer Neg Hx   . Colon polyps Neg Hx   . Esophageal cancer Neg Hx   . Kidney disease Neg Hx    History  Sexual Activity  . Sexual activity: No    Outpatient Encounter Prescriptions as of 01/17/2017  Medication Sig  . ALPRAZolam (XANAX) 0.5 MG tablet Take 0.5 mg by mouth 3 (three) times daily as needed for anxiety.   Marland Kitchen aspirin EC 81 MG tablet Take 81 mg by mouth once a week.   Marland Kitchen atorvastatin (LIPITOR) 40 MG tablet TAKE 1  TABLET(40 MG) BY MOUTH AT BEDTIME  . cyanocobalamin (,VITAMIN B-12,) 1000 MCG/ML injection Inject 1 mL (1,000 mcg total) into the muscle every 30 (thirty) days.  . furosemide (LASIX) 40 MG tablet TAKE 1 TABLET BY MOUTH EVERY DAY  . gabapentin (NEURONTIN) 400 MG capsule Take 400-1,200 mg by mouth 3 (three) times daily. Take one capsule in the morning, one capsule 7 hours later and then three capsules at bedtime.  Marland Kitchen glimepiride (AMARYL) 1 MG tablet Take 1 tablet (1 mg total) by mouth daily with breakfast.  . lamoTRIgine (LAMICTAL) 200 MG tablet Take 200 mg by mouth daily.  Marland Kitchen levothyroxine (SYNTHROID, LEVOTHROID) 112 MCG tablet TAKE 2 TABLETS BY MOUTH EVERY DAY BEOFRE BREAKFAST  . metFORMIN (GLUCOPHAGE) 850 MG tablet TAKE 1 TABLET BY MOUTH TWICE DAILY WITH A MEAL  . nystatin ointment (MYCOSTATIN) Apply 1 application topically daily as needed. Apply to red areas in groin  . omeprazole (PRILOSEC) 40 MG capsule TAKE 1 CAPSULE (40 MG TOTAL) BY MOUTH DAILY.  Marland Kitchen potassium chloride (K-DUR) 10 MEQ tablet TAKE 2 TABLETS(20 MEQ) BY MOUTH DAILY  . ramipril (ALTACE) 10 MG capsule TAKE 1 CAPSULE BY MOUTH EVERY DAY  . rOPINIRole (REQUIP) 3 MG tablet TAKE 1 TABLET (3 MG TOTAL) BY MOUTH AT BEDTIME.  Marland Kitchen triamcinolone cream (KENALOG) 0.1 % APPLY TO AFFECTED AREA ON EXTERNAL EAR TWICE DAILY. DO NOT USE FOR MORE THAN 2 WEEKS AT A TIME  . Vilazodone HCl (VIIBRYD) 40 MG TABS Take 40 mg by mouth every morning.  . [DISCONTINUED] predniSONE (DELTASONE) 10 MG tablet Take 4 tablets (40 mg total) by mouth daily with breakfast.  . nitroGLYCERIN (NITROSTAT) 0.4 MG SL tablet Place 1 tablet (0.4 mg total) under the tongue every 5 (five) minutes as needed for chest pain.   Facility-Administered Encounter Medications as of 01/17/2017  Medication  . triamcinolone acetonide (KENALOG) 10 MG/ML injection 10 mg    Activities of Daily Living In your present state of health, do you have any difficulty performing the following activities:  01/17/2017 06/23/2016  Hearing? N N  Vision? Y N  Difficulty concentrating or making decisions? N N  Walking or climbing stairs? Y N  Dressing or bathing? N N  Doing errands, shopping? N -  Preparing Food and eating ? N -  Using the Toilet? N -  In the past six months, have you accidently leaked urine? N -  Do you have problems with loss of bowel control? Y -  Comment related to colitis -  Managing your Medications? N -  Managing your Finances? N -  Housekeeping  or managing your Housekeeping? N -  Some recent data might be hidden    Patient Care Team: Ria Bush, MD as PCP - General (Family Medicine) Minna Merritts, MD as Consulting Physician (Cardiology)    Assessment:     Hearing Screening   125Hz  250Hz  500Hz  1000Hz  2000Hz  3000Hz  4000Hz  6000Hz  8000Hz   Right ear:   40 40 40  40    Left ear:   40 40 40  40    Vision Screening Comments: Vision exam scheduled 01/23/2017   Exercise Activities and Dietary recommendations Current Exercise Habits: The patient does not participate in regular exercise at present, Exercise limited by: None identified  Goals    . Increase water intake          Starting 01/17/2017, I will attempt to drink at least 6-8 glasses of water daily. Also, I want to resume swimming for exercise.       Fall Risk Fall Risk  01/17/2017 12/17/2015 09/01/2015 10/02/2014 05/12/2013  Falls in the past year? Yes Yes No Yes Yes  Comment pt reports tripping and falling in store due to mat out of place - - - -  Number falls in past yr: 1 2 or more - 2 or more 1  Comment - - - - Walking dog and got caught in leash  Injury with Fall? Yes No - No No  Risk Factor Category  - - - High Fall Risk -  Risk for fall due to : - - - History of fall(s);Impaired balance/gait -  Follow up - - - Falls evaluation completed -  Comment - - - knees giving out -   Depression Screen PHQ 2/9 Scores 01/17/2017 09/01/2015 10/02/2014 05/12/2013  PHQ - 2 Score 0 0 0 1  PHQ- 9 Score 2 - - -    Exception Documentation - - Other- indicate reason in comment box -  Not completed - - pt currently on medication for this -     Cognitive Function MMSE - Mini Mental State Exam 01/17/2017  Orientation to time 5  Orientation to Place 5  Registration 3  Attention/ Calculation 0  Recall 3  Language- name 2 objects 0  Language- repeat 1  Language- follow 3 step command 3  Language- read & follow direction 0  Write a sentence 0  Copy design 0  Total score 20       PLEASE NOTE: A Mini-Cog screen was completed. Maximum score is 20. A value of 0 denotes this part of [place] MMSE was not completed or the patient failed this part of the Mini-Cog screening.   Mini-Cog Screening Orientation to Time - Max 5 pts Orientation to Place - Max 5 pts Registration - Max 3 pts Recall - Max 3 pts Language Repeat - Max 1 pts Language Follow 3 Step Command - Max 3 pts   Immunization History  Administered Date(s) Administered  . Influenza Split 03/08/2011, 01/29/2012  . Influenza Whole 05/11/2005  . Influenza,inj,Quad PF,6+ Mos 04/14/2013, 02/13/2014, 02/02/2015, 01/18/2016  . Pneumococcal Polysaccharide-23 03/08/2011  . Td 09/10/1997  . Tdap 01/29/2012   Screening Tests Health Maintenance  Topic Date Due  . MAMMOGRAM  02/11/2017 (Originally 06/04/2011)  . OPHTHALMOLOGY EXAM  06/14/2017 (Originally 06/15/2016)  . INFLUENZA VACCINE  08/12/2017 (Originally 12/13/2016)  . HIV Screening  01/17/2018 (Originally 05/06/1966)  . PAP SMEAR  01/17/2049 (Originally 05/26/2012)  . HEMOGLOBIN A1C  07/17/2017  . FOOT EXAM  09/13/2017  . DTaP/Tdap/Td (2 - Td) 01/28/2022  .  TETANUS/TDAP  01/28/2022  . COLONOSCOPY  12/20/2025  . Hepatitis C Screening  Completed      Plan:     I have personally reviewed and addressed the Medicare Annual Wellness questionnaire and have noted the following in the patient's chart:  A. Medical and social history B. Use of alcohol, tobacco or illicit drugs  C. Current  medications and supplements D. Functional ability and status E.  Nutritional status F.  Physical activity G. Advance directives H. List of other physicians I.  Hospitalizations, surgeries, and ER visits in previous 12 months J.  [place] to include hearing, vision, cognitive, depression L. Referrals and appointments - none  In addition, I have reviewed and discussed with patient certain preventive protocols, quality metrics, and best practice recommendations. A written personalized care plan for preventive services as well as general preventive health recommendations were provided to patient.  See attached scanned questionnaire for additional information.   Signed,   Lindell Noe, MHA, BS, LPN Health Coach

## 2017-01-17 NOTE — Progress Notes (Signed)
Pre visit review using our clinic review tool, if applicable. No additional management support is needed unless otherwise documented below in the visit note. 

## 2017-01-18 ENCOUNTER — Ambulatory Visit
Admission: RE | Admit: 2017-01-18 | Discharge: 2017-01-18 | Disposition: A | Discharge status: Home / Self Care | Payer: Medicare HMO | Source: Ambulatory Visit | Attending: Family Medicine | Admitting: Family Medicine

## 2017-01-18 DIAGNOSIS — Z1231 Encounter for screening mammogram for malignant neoplasm of breast: Secondary | ICD-10-CM | POA: Insufficient documentation

## 2017-01-19 ENCOUNTER — Other Ambulatory Visit: Payer: Self-pay | Admitting: *Deleted

## 2017-01-19 ENCOUNTER — Inpatient Hospital Stay
Admission: RE | Admit: 2017-01-19 | Discharge: 2017-01-19 | Disposition: A | Discharge status: Home / Self Care | Payer: Self-pay | Source: Ambulatory Visit | Attending: *Deleted | Admitting: *Deleted

## 2017-01-19 DIAGNOSIS — Z9289 Personal history of other medical treatment: Secondary | ICD-10-CM

## 2017-01-19 LAB — HM MAMMOGRAPHY

## 2017-01-21 NOTE — Progress Notes (Signed)
I reviewed health advisor's note, was available for consultation, and agree with documentation and plan.  

## 2017-01-22 ENCOUNTER — Encounter: Payer: Self-pay | Admitting: Family Medicine

## 2017-01-22 ENCOUNTER — Ambulatory Visit (INDEPENDENT_AMBULATORY_CARE_PROVIDER_SITE_OTHER): Payer: Medicare HMO | Admitting: Family Medicine

## 2017-01-22 VITALS — BP 124/80 | HR 73 | Temp 97.9°F | Ht 65.0 in | Wt 335.2 lb

## 2017-01-22 DIAGNOSIS — I25118 Atherosclerotic heart disease of native coronary artery with other forms of angina pectoris: Secondary | ICD-10-CM | POA: Diagnosis not present

## 2017-01-22 DIAGNOSIS — E1165 Type 2 diabetes mellitus with hyperglycemia: Secondary | ICD-10-CM | POA: Diagnosis not present

## 2017-01-22 DIAGNOSIS — Z0001 Encounter for general adult medical examination with abnormal findings: Secondary | ICD-10-CM

## 2017-01-22 DIAGNOSIS — I5032 Chronic diastolic (congestive) heart failure: Secondary | ICD-10-CM

## 2017-01-22 DIAGNOSIS — Z6841 Body Mass Index (BMI) 40.0 and over, adult: Secondary | ICD-10-CM

## 2017-01-22 DIAGNOSIS — F4321 Adjustment disorder with depressed mood: Secondary | ICD-10-CM

## 2017-01-22 DIAGNOSIS — E1140 Type 2 diabetes mellitus with diabetic neuropathy, unspecified: Secondary | ICD-10-CM

## 2017-01-22 DIAGNOSIS — Z7189 Other specified counseling: Secondary | ICD-10-CM

## 2017-01-22 DIAGNOSIS — E785 Hyperlipidemia, unspecified: Secondary | ICD-10-CM | POA: Diagnosis not present

## 2017-01-22 DIAGNOSIS — Z23 Encounter for immunization: Secondary | ICD-10-CM | POA: Diagnosis not present

## 2017-01-22 DIAGNOSIS — E782 Mixed hyperlipidemia: Secondary | ICD-10-CM

## 2017-01-22 DIAGNOSIS — E538 Deficiency of other specified B group vitamins: Secondary | ICD-10-CM

## 2017-01-22 DIAGNOSIS — E1151 Type 2 diabetes mellitus with diabetic peripheral angiopathy without gangrene: Secondary | ICD-10-CM | POA: Diagnosis not present

## 2017-01-22 DIAGNOSIS — L304 Erythema intertrigo: Secondary | ICD-10-CM | POA: Diagnosis not present

## 2017-01-22 DIAGNOSIS — E039 Hypothyroidism, unspecified: Secondary | ICD-10-CM

## 2017-01-22 DIAGNOSIS — IMO0002 Reserved for concepts with insufficient information to code with codable children: Secondary | ICD-10-CM

## 2017-01-22 MED ORDER — CYANOCOBALAMIN 1000 MCG/ML IJ SOLN
1000.0000 ug | Freq: Once | INTRAMUSCULAR | Status: AC
  Administered 2017-01-22: 1000 ug via INTRAMUSCULAR

## 2017-01-22 NOTE — Assessment & Plan Note (Signed)
Preventative protocols reviewed and updated unless pt declined. Discussed healthy diet and lifestyle.  

## 2017-01-22 NOTE — Assessment & Plan Note (Addendum)
b12 shot today. Encouraged compliance with regular injections.

## 2017-01-22 NOTE — Progress Notes (Signed)
BP 124/80 (BP Location: Left Arm, Patient Position: Sitting, Cuff Size: Large)   Pulse 73   Temp 97.9 F (36.6 C) (Oral)   Ht 5\' 5"  (1.651 m)   Wt (!) 335 lb 4 oz (152.1 kg)   SpO2 91%   BMI 55.79 kg/m    CC: CPE Subjective:    Patient ID: [name], female    DOB: 04/22/1951, 66 y.o.   MRN: [id number]  HPI: [name] is a 66 y.o. female presenting on 01/22/2017 for Medicare Wellness   Saw [name] last week for medicare wellness visit. Note reviewed. Rash under breasts not improved with nystatin.   Cardiac catheterization 06/2016 showing minor nonobstructive CAD.  L hand 5th MC fracture, ?3rd MC fracture s/p hand surgery 10/2016.  Noticing worsening trouble with pedal edema and bilateral leg pain. Starting to watch salt intake. She does weigh herself once weekly.   Eye appointment tomorrow.  Preventative: Colonoscopy 12/2015 - [facility], focal chronic active colitis (Dr Carlean Purl).  Well woman with OBGYN - normal pap and breast/mammo in past, due for f/u ([name]).  Last mammogram - 01/2017 [facility]  Flu shot yearly Pneumovax 02/2011  Tdap 2013 shingrix - discussed Advanced directive: scanned 02/2012. Daughter Caitlyn Maryland RN is [facility] proxy. Does not want prolonged life support if terminal condition, daughter may make decisions. Full code. [place] with temporary life support.  Seat belt use discussed  Sunscreen use discussed. No changing moles on skin. No smoking  No alcohol   Caffeine: 1 cup coffee/day  Divorced. Lives alone - daughter just moved to [place] Occ: retired, cares for children part time [facility] - not recently. Diet: watching diet, low sodium. Good water. Fruits/vegetables daily.  Relevant past medical, surgical, family and social history reviewed and updated as indicated. Interim medical history since our last visit reviewed. Allergies and medications reviewed and updated. Outpatient Medications Prior to Visit  Medication Sig  Dispense Refill  . ALPRAZolam (XANAX) 0.5 MG tablet Take 0.5 mg by mouth 3 (three) times daily as needed for anxiety.   2  . aspirin EC 81 MG tablet Take 81 mg by mouth once a week.     Marland Kitchen atorvastatin (LIPITOR) 40 MG tablet TAKE 1 TABLET(40 MG) BY MOUTH AT BEDTIME 90 tablet 1  . cyanocobalamin (,VITAMIN B-12,) 1000 MCG/ML injection Inject 1 mL (1,000 mcg total) into the muscle every 30 (thirty) days. 10 mL 3  . furosemide (LASIX) 40 MG tablet TAKE 1 TABLET BY MOUTH EVERY DAY 90 tablet 3  . gabapentin (NEURONTIN) 400 MG capsule Take 400-1,200 mg by mouth 3 (three) times daily. Take one capsule in the morning, one capsule 7 hours later and then three capsules at bedtime.  0  . glimepiride (AMARYL) 1 MG tablet Take 1 tablet (1 mg total) by mouth daily with breakfast. 30 tablet 6  . lamoTRIgine (LAMICTAL) 200 MG tablet Take 200 mg by mouth daily.    Marland Kitchen levothyroxine (SYNTHROID, LEVOTHROID) 112 MCG tablet TAKE 2 TABLETS BY MOUTH EVERY DAY BEOFRE BREAKFAST 60 tablet 10  . metFORMIN (GLUCOPHAGE) 850 MG tablet TAKE 1 TABLET BY MOUTH TWICE DAILY WITH A MEAL 180 tablet 3  . nystatin ointment (MYCOSTATIN) Apply 1 application topically daily as needed. Apply to red areas in groin 30 g 1  . omeprazole (PRILOSEC) 40 MG capsule TAKE 1 CAPSULE (40 MG TOTAL) BY MOUTH DAILY. 30 capsule 6  . potassium chloride (K-DUR) 10 MEQ tablet TAKE 2 TABLETS(20  MEQ) BY MOUTH DAILY 180 tablet 1  . ramipril (ALTACE) 10 MG capsule TAKE 1 CAPSULE BY MOUTH EVERY DAY 90 capsule 1  . rOPINIRole (REQUIP) 3 MG tablet TAKE 1 TABLET (3 MG TOTAL) BY MOUTH AT BEDTIME. 90 tablet 0  . triamcinolone cream (KENALOG) 0.1 % APPLY TO AFFECTED AREA ON EXTERNAL EAR TWICE DAILY. DO NOT USE FOR MORE THAN 2 WEEKS AT A TIME 30 g 0  . Vilazodone HCl (VIIBRYD) 40 MG TABS Take 40 mg by mouth every morning.    . nitroGLYCERIN (NITROSTAT) 0.4 MG SL tablet Place 1 tablet (0.4 mg total) under the tongue every 5 (five) minutes as needed for chest pain. 25 tablet  3  . triamcinolone acetonide (KENALOG) 10 MG/ML injection 10 mg      No facility-administered medications prior to visit.      Per HPI unless specifically indicated in ROS section below Review of Systems  Constitutional: Negative for activity change, appetite change, chills, fatigue, fever and unexpected weight change.  HENT: Positive for ear pain (right). Negative for hearing loss.   Eyes: Negative for visual disturbance.  Respiratory: Positive for cough (at night), chest tightness and shortness of breath. Negative for wheezing.   Cardiovascular: Positive for leg swelling (worsening). Negative for chest pain and palpitations.  Gastrointestinal: Negative for abdominal distention, abdominal pain, blood in stool, constipation, diarrhea, nausea and vomiting.  Genitourinary: Negative for difficulty urinating and hematuria.  Musculoskeletal: Negative for arthralgias, myalgias and neck pain.  Skin: Negative for rash.  Neurological: Positive for dizziness (mild in am) and headaches. Negative for seizures and syncope.  Hematological: Negative for adenopathy. Bruises/bleeds easily.  Psychiatric/Behavioral: Negative for dysphoric mood. The patient is not nervous/anxious.        Objective:    BP 124/80 (BP Location: Left Arm, Patient Position: Sitting, Cuff Size: Large)   Pulse 73   Temp 97.9 F (36.6 C) (Oral)   Ht 5\' 5"  (1.651 m)   Wt (!) 335 lb 4 oz (152.1 kg)   SpO2 91%   BMI 55.79 kg/m   Wt Readings from Last 3 Encounters:  01/22/17 (!) 335 lb 4 oz (152.1 kg)  01/17/17 (!) 336 lb (152.4 kg)  11/01/16 (!) 328 lb (148.8 kg)    Physical Exam  Constitutional: She is oriented to person, place, and time. She appears well-developed and well-nourished. No distress.  HENT:  Head: Normocephalic and atraumatic.  Right Ear: Hearing, tympanic membrane, external ear and ear canal normal.  Left Ear: Hearing, tympanic membrane, external ear and ear canal normal.  Nose: Nose normal.    Mouth/Throat: Uvula is midline, oropharynx is clear and moist and mucous membranes are normal. No oropharyngeal exudate, posterior oropharyngeal edema or posterior oropharyngeal erythema.  Eyes: Pupils are equal, round, and reactive to light. Conjunctivae and EOM are normal. No scleral icterus.  Neck: Normal range of motion. Neck supple. No thyromegaly present.  Cardiovascular: Normal rate, regular rhythm, normal heart sounds and intact distal pulses.   No murmur heard. Pulses:      Radial pulses are 2+ on the right side, and 2+ on the left side.  Pulmonary/Chest: Effort normal and breath sounds normal. No respiratory distress. She has no wheezes. She has no rales.  Abdominal: Soft. Bowel sounds are normal. She exhibits no distension and no mass. There is no tenderness. There is no rebound and no guarding.  Musculoskeletal: Normal range of motion. She exhibits no edema.  Lymphadenopathy:    She has no cervical adenopathy.  Neurological: She is alert and oriented to person, place, and time.  CN grossly intact, station and gait intact  Skin: Skin is warm and dry. Rash noted.  Angry pruritic erythematous rash below bilateral breasts  Psychiatric: She has a normal mood and affect. Her behavior is normal. Judgment and thought content normal.  Nursing note and vitals reviewed.  Results for orders placed or performed in visit on 01/22/17  HM MAMMOGRAPHY  Result Value Ref Range   HM Mammogram 0-4 Bi-Rad 0-4 Bi-Rad, Self Reported Normal      Assessment & Plan:   Problem List Items Addressed This Visit    Adjustment disorder with depressed mood    Stable period on viibryd. Followed by psych.      Advanced care planning/counseling discussion    Advanced directive: scanned 02/2012. Daughter Caitlyn Maryland RN is [facility] proxy. Does not want prolonged life support if terminal condition, daughter may make decisions. Full code. [place] with temporary life support.      Chronic diastolic heart failure (HCC)     Anticipate mild flare currently - rec increase lasix to 80mg  daily x 5 days then return to 40mg  daily. Continue salt restriction and daily weights. Reviewed importance of weight loss. Pt agrees with plan.       Coronary artery disease    Nonocclusive by cath 06/2016. Continue statin. She is only taking aspirin 81mg  once weekly due to easy bruising.       Diabetic peripheral vascular disease ([place])   Encounter for routine adult medical exam with abnormal findings - Primary    Preventative protocols reviewed and updated unless pt declined. Discussed healthy diet and lifestyle.       Hyperlipidemia    Chronic, improved. Continue current regimen of lipitor 40mg  daily      Hypothyroidism    TFTs in range on current regimen - continue.       Intertrigo    Not improving with nystatin alone. Reviewed home care - rec start lotrimin, continue desitin.       Morbid obesity with BMI of 50.0-59.9, adult [facility])    Reviewed importance of weight loss to help control her chronic medical conditions. Encouraged her to restart aquatic exercise.       Type 2 diabetes mellitus, uncontrolled, with neuropathy (HCC)    Chronic, not checking sugars. Continue current regimen. RTC 3 mo DM f/u visit. Eye exam already scheduled later this month.       Vitamin B12 deficiency    b12 shot today. Encouraged compliance with regular injections.           Follow up plan: Return in about 3 months (around 04/23/2017) for follow up visit.  Ria Bush, MD

## 2017-01-22 NOTE — Assessment & Plan Note (Signed)
Not improving with nystatin alone. Reviewed home care - rec start lotrimin, continue desitin.

## 2017-01-22 NOTE — Assessment & Plan Note (Signed)
TFTs in range on current regimen - continue.

## 2017-01-22 NOTE — Assessment & Plan Note (Addendum)
Anticipate mild flare currently - rec increase lasix to 80mg  daily x 5 days then return to 40mg  daily. Continue salt restriction and daily weights. Reviewed importance of weight loss. Pt agrees with plan.

## 2017-01-22 NOTE — Assessment & Plan Note (Signed)
Chronic, improved. Continue current regimen of lipitor 40mg  daily

## 2017-01-22 NOTE — Assessment & Plan Note (Signed)
Nonocclusive by cath 06/2016. Continue statin. She is only taking aspirin 81mg  once weekly due to easy bruising.

## 2017-01-22 NOTE — Assessment & Plan Note (Addendum)
Reviewed importance of weight loss to help control her chronic medical conditions. Encouraged her to restart aquatic exercise.

## 2017-01-22 NOTE — Assessment & Plan Note (Signed)
Stable period on viibryd. Followed by psych.

## 2017-01-22 NOTE — Patient Instructions (Addendum)
Flu shot today b12 shot today - then come in for monthly b12 shots.  If interested, check with pharmacy about new 2 shot shingles series (shingrix).  Start using lotrimin over the counter, continue desitin for intertrigo (below breasts).  Increase lasix to '80mg'$  daily for 5 days. Continue to monitor weights at home, let us know if rising. Continue to limit sodium intake to '1500mg'$ .  Return in 3 months for diabetes follow up. Bring in 1 wk log of sugars to next appointment Health Maintenance, Female Adopting a healthy lifestyle and getting preventive care can go a long way to promote health and wellness. Talk with your health care provider about what schedule of regular examinations is right for you. This is a good chance for you to check in with your provider about disease prevention and staying healthy. In between checkups, there are plenty of things you can do on your own. Experts have done a lot of research about which lifestyle changes and preventive measures are most likely to keep you healthy. Ask your health care provider for more information. Weight and diet Eat a healthy diet  Be sure to include plenty of vegetables, fruits, low-fat dairy products, and lean protein.  Do not eat a lot of foods high in solid fats, added sugars, or salt.  Get regular exercise. This is one of the most important things you can do for your health. ? Most adults should exercise for at least 150 minutes each week. The exercise should increase your heart rate and make you sweat (moderate-intensity exercise). ? Most adults should also do strengthening exercises at least twice a week. This is in addition to the moderate-intensity exercise.  Maintain a healthy weight  Body mass index (BMI) is a measurement that can be used to identify possible weight problems. It estimates body fat based on height and weight. Your health care provider can help determine your BMI and help you achieve or maintain a healthy  weight.  For females 38 years of age and older: ? A BMI below 18.5 is considered underweight. ? A BMI of 18.5 to 24.9 is normal. ? A BMI of 25 to 29.9 is considered overweight. ? A BMI of 30 and above is considered obese.  Watch levels of cholesterol and blood lipids  You should start having your blood tested for lipids and cholesterol at 65 years of age, then have this test every 5 years.  You may need to have your cholesterol levels checked more often if: ? Your lipid or cholesterol levels are high. ? You are older than 65 years of age. ? You are at high risk for heart disease.  Cancer screening Lung Cancer  Lung cancer screening is recommended for adults 65-46 years old who are at high risk for lung cancer because of a history of smoking.  A yearly low-dose CT scan of the lungs is recommended for people who: ? Currently smoke. ? Have quit within the past 15 years. ? Have at least a 30-pack-year history of smoking. A pack year is smoking an average of one pack of cigarettes a day for 1 year.  Yearly screening should continue until it has been 15 years since you quit.  Yearly screening should stop if you develop a health problem that would prevent you from having lung cancer treatment.  Breast Cancer  Practice breast self-awareness. This means understanding how your breasts normally appear and feel.  It also means doing regular breast self-exams. Let your health care provider know  about any changes, no matter how small.  If you are in your 20s or 30s, you should have a clinical breast exam (CBE) by a health care provider every 1-3 years as part of a regular health exam.  If you are 77 or older, have a CBE every year. Also consider having a breast X-ray (mammogram) every year.  If you have a family history of breast cancer, talk to your health care provider about genetic screening.  If you are at high risk for breast cancer, talk to your health care provider about having an  MRI and a mammogram every year.  Breast cancer gene (BRCA) assessment is recommended for women who have family members with BRCA-related cancers. BRCA-related cancers include: ? Breast. ? Ovarian. ? Tubal. ? Peritoneal cancers.  Results of the assessment will determine the need for genetic counseling and BRCA1 and BRCA2 testing.  Cervical Cancer Your health care provider may recommend that you be screened regularly for cancer of the pelvic organs (ovaries, uterus, and vagina). This screening involves a pelvic examination, including checking for microscopic changes to the surface of your cervix (Pap test). You may be encouraged to have this screening done every 3 years, beginning at age 58.  For women ages 20-65, health care providers may recommend pelvic exams and Pap testing every 3 years, or they may recommend the Pap and pelvic exam, combined with testing for human papilloma virus (HPV), every 5 years. Some types of HPV increase your risk of cervical cancer. Testing for HPV may also be done on women of any age with unclear Pap test results.  Other health care providers may not recommend any screening for nonpregnant women who are considered low risk for pelvic cancer and who do not have symptoms. Ask your health care provider if a screening pelvic exam is right for you.  If you have had past treatment for cervical cancer or a condition that could lead to cancer, you need Pap tests and screening for cancer for at least 20 years after your treatment. If Pap tests have been discontinued, your risk factors (such as having a new sexual partner) need to be reassessed to determine if screening should resume. Some women have medical problems that increase the chance of getting cervical cancer. In these cases, your health care provider may recommend more frequent screening and Pap tests.  Colorectal Cancer  This type of cancer can be detected and often prevented.  Routine colorectal cancer screening  usually begins at 65 years of age and continues through 65 years of age.  Your health care provider may recommend screening at an earlier age if you have risk factors for colon cancer.  Your health care provider may also recommend using home test kits to check for hidden blood in the stool.  A small camera at the end of a tube can be used to examine your colon directly (sigmoidoscopy or colonoscopy). This is done to check for the earliest forms of colorectal cancer.  Routine screening usually begins at age 2.  Direct examination of the colon should be repeated every 5-10 years through 65 years of age. However, you may need to be screened more often if early forms of precancerous polyps or small growths are found.  Skin Cancer  Check your skin from head to toe regularly.  Tell your health care provider about any new moles or changes in moles, especially if there is a change in a mole's shape or color.  Also tell your health care  provider if you have a mole that is larger than the size of a pencil eraser.  Always use sunscreen. Apply sunscreen liberally and repeatedly throughout the day.  Protect yourself by wearing long sleeves, pants, a wide-brimmed hat, and sunglasses whenever you are outside.  Heart disease, diabetes, and high blood pressure  High blood pressure causes heart disease and increases the risk of stroke. High blood pressure is more likely to develop in: ? People who have blood pressure in the high end of the normal range (130-139/85-89 mm Hg). ? People who are overweight or obese. ? People who are African American.  If you are 82-47 years of age, have your blood pressure checked every 3-5 years. If you are 30 years of age or older, have your blood pressure checked every year. You should have your blood pressure measured twice-once when you are at a hospital or clinic, and once when you are not at a hospital or clinic. Record the average of the two measurements. To check  your blood pressure when you are not at a hospital or clinic, you can use: ? An automated blood pressure machine at a pharmacy. ? A home blood pressure monitor.  If you are between 73 years and 40 years old, ask your health care provider if you should take aspirin to prevent strokes.  Have regular diabetes screenings. This involves taking a blood sample to check your fasting blood sugar level. ? If you are at a normal weight and have a low risk for diabetes, have this test once every three years after 65 years of age. ? If you are overweight and have a high risk for diabetes, consider being tested at a younger age or more often. Preventing infection Hepatitis B  If you have a higher risk for hepatitis B, you should be screened for this virus. You are considered at high risk for hepatitis B if: ? You were born in a country where hepatitis B is common. Ask your health care provider which countries are considered high risk. ? Your parents were born in a high-risk country, and you have not been immunized against hepatitis B (hepatitis B vaccine). ? You have HIV or AIDS. ? You use needles to inject street drugs. ? You live with someone who has hepatitis B. ? You have had sex with someone who has hepatitis B. ? You get hemodialysis treatment. ? You take certain medicines for conditions, including cancer, organ transplantation, and autoimmune conditions.  Hepatitis C  Blood testing is recommended for: ? Everyone born from [birth year] through 1964. ? Anyone with known risk factors for hepatitis C.  Sexually transmitted infections (STIs)  You should be screened for sexually transmitted infections (STIs) including gonorrhea and chlamydia if: ? You are sexually active and are younger than 65 years of age. ? You are older than 65 years of age and your health care provider tells you that you are at risk for this type of infection. ? Your sexual activity has changed since you were last screened and you  are at an increased risk for chlamydia or gonorrhea. Ask your health care provider if you are at risk.  If you do not have HIV, but are at risk, it may be recommended that you take a prescription medicine daily to prevent HIV infection. This is called pre-exposure prophylaxis (PrEP). You are considered at risk if: ? You are sexually active and do not regularly use condoms or know the HIV status of your partner(s). ? You take  drugs by injection. ? You are sexually active with a partner who has HIV.  Talk with your health care provider about whether you are at high risk of being infected with HIV. If you choose to begin PrEP, you should first be tested for HIV. You should then be tested every 3 months for as long as you are taking PrEP. Pregnancy  If you are premenopausal and you may become pregnant, ask your health care provider about preconception counseling.  If you may become pregnant, take 400 to 800 micrograms (mcg) of folic acid every day.  If you want to prevent pregnancy, talk to your health care provider about birth control (contraception). Osteoporosis and menopause  Osteoporosis is a disease in which the bones lose minerals and strength with aging. This can result in serious bone fractures. Your risk for osteoporosis can be identified using a bone density scan.  If you are 7 years of age or older, or if you are at risk for osteoporosis and fractures, ask your health care provider if you should be screened.  Ask your health care provider whether you should take a calcium or vitamin D supplement to lower your risk for osteoporosis.  Menopause may have certain physical symptoms and risks.  Hormone replacement therapy may reduce some of these symptoms and risks. Talk to your health care provider about whether hormone replacement therapy is right for you. Follow these instructions at home:  Schedule regular health, dental, and eye exams.  Stay current with your  immunizations.  Do not use any tobacco products including cigarettes, chewing tobacco, or electronic cigarettes.  If you are pregnant, do not drink alcohol.  If you are breastfeeding, limit how much and how often you drink alcohol.  Limit alcohol intake to no more than 1 drink per day for nonpregnant women. One drink equals 12 ounces of beer, 5 ounces of wine, or 1 ounces of hard liquor.  Do not use street drugs.  Do not share needles.  Ask your health care provider for help if you need support or information about quitting drugs.  Tell your health care provider if you often feel depressed.  Tell your health care provider if you have ever been abused or do not feel safe at home. This information is not intended to replace advice given to you by your health care provider. Make sure you discuss any questions you have with your health care provider. Document Released: 11/14/2010 Document Revised: 10/07/2015 Document Reviewed: 02/02/2015 Elsevier Interactive Patient Education  [employer].

## 2017-01-22 NOTE — Assessment & Plan Note (Signed)
Chronic, not checking sugars. Continue current regimen. RTC 3 mo DM f/u visit. Eye exam already scheduled later this month.

## 2017-01-22 NOTE — Addendum Note (Signed)
Addended by: Brenton Grills on: 06/20/154 15:37 AM   Modules accepted: Orders

## 2017-01-22 NOTE — Assessment & Plan Note (Addendum)
Advanced directive: scanned 02/2012. Daughter Colletta Maryland RN is [facility] proxy. Does not want prolonged life support if terminal condition, daughter may make decisions. Full code. [place] with temporary life support.

## 2017-01-23 LAB — HM MAMMOGRAPHY

## 2017-01-25 ENCOUNTER — Encounter: Payer: Self-pay | Admitting: Family Medicine

## 2017-02-03 ENCOUNTER — Other Ambulatory Visit: Payer: Self-pay | Admitting: Family Medicine

## 2017-02-22 ENCOUNTER — Ambulatory Visit: Payer: Medicare HMO

## 2017-02-23 ENCOUNTER — Ambulatory Visit: Payer: Medicare HMO | Admitting: Internal Medicine

## 2017-03-04 ENCOUNTER — Other Ambulatory Visit: Payer: Self-pay | Admitting: Family Medicine

## 2017-03-13 DIAGNOSIS — R69 Illness, unspecified: Secondary | ICD-10-CM | POA: Diagnosis not present

## 2017-03-13 DIAGNOSIS — F5081 Binge eating disorder: Secondary | ICD-10-CM | POA: Diagnosis not present

## 2017-03-13 DIAGNOSIS — Z79899 Other long term (current) drug therapy: Secondary | ICD-10-CM | POA: Diagnosis not present

## 2017-03-13 DIAGNOSIS — F4312 Post-traumatic stress disorder, chronic: Secondary | ICD-10-CM | POA: Diagnosis not present

## 2017-03-18 ENCOUNTER — Other Ambulatory Visit: Payer: Self-pay | Admitting: Family Medicine

## 2017-03-20 ENCOUNTER — Telehealth: Payer: Self-pay | Admitting: Family Medicine

## 2017-03-20 ENCOUNTER — Other Ambulatory Visit: Payer: Self-pay

## 2017-03-20 MED ORDER — NYSTATIN 100000 UNIT/GM EX OINT: 1.0000 "application " | TOPICAL_OINTMENT | Freq: Every day | CUTANEOUS | 1 refills | Status: DC | PRN

## 2017-03-20 MED ORDER — METFORMIN HCL 850 MG PO TABS: ORAL_TABLET | ORAL | 3 refills | Status: DC

## 2017-03-20 NOTE — Telephone Encounter (Signed)
Copied from [place] [id number]. Topic: Quick Communication - See Telephone Encounter >> Mar 20, 2017  9:05 AM Arletha Grippe wrote: CRM for notification. See Telephone encounter for:   03/20/17. Pt needs refill for metFORMIN (GLUCOPHAGE) 850 MG tablet [facility] [address]  Pt is OUT of medication Cb number for pt is [phone]  [facility], but pt need medication today please

## 2017-03-27 ENCOUNTER — Ambulatory Visit: Payer: Medicare HMO

## 2017-03-29 DIAGNOSIS — S82409A Unspecified fracture of shaft of unspecified fibula, initial encounter for closed fracture: Secondary | ICD-10-CM | POA: Insufficient documentation

## 2017-03-29 DIAGNOSIS — I10 Essential (primary) hypertension: Secondary | ICD-10-CM | POA: Insufficient documentation

## 2017-03-29 DIAGNOSIS — M1990 Unspecified osteoarthritis, unspecified site: Secondary | ICD-10-CM | POA: Insufficient documentation

## 2017-03-29 DIAGNOSIS — T4145XA Adverse effect of unspecified anesthetic, initial encounter: Secondary | ICD-10-CM | POA: Insufficient documentation

## 2017-03-29 DIAGNOSIS — F419 Anxiety disorder, unspecified: Secondary | ICD-10-CM | POA: Insufficient documentation

## 2017-03-29 DIAGNOSIS — T8859XA Other complications of anesthesia, initial encounter: Secondary | ICD-10-CM | POA: Insufficient documentation

## 2017-03-29 DIAGNOSIS — F331 Major depressive disorder, recurrent, moderate: Secondary | ICD-10-CM | POA: Insufficient documentation

## 2017-03-29 DIAGNOSIS — K219 Gastro-esophageal reflux disease without esophagitis: Secondary | ICD-10-CM | POA: Insufficient documentation

## 2017-03-29 DIAGNOSIS — K529 Noninfective gastroenteritis and colitis, unspecified: Secondary | ICD-10-CM | POA: Insufficient documentation

## 2017-03-29 DIAGNOSIS — K621 Rectal polyp: Secondary | ICD-10-CM | POA: Insufficient documentation

## 2017-04-09 ENCOUNTER — Other Ambulatory Visit: Payer: Self-pay | Admitting: Family Medicine

## 2017-04-11 ENCOUNTER — Ambulatory Visit: Payer: Medicare HMO | Admitting: Cardiovascular Disease

## 2017-04-18 ENCOUNTER — Encounter: Payer: Self-pay | Admitting: Cardiovascular Disease

## 2017-04-18 ENCOUNTER — Ambulatory Visit (INDEPENDENT_AMBULATORY_CARE_PROVIDER_SITE_OTHER): Payer: Medicare Other | Admitting: Cardiovascular Disease

## 2017-04-18 VITALS — BP 126/80 | HR 71 | Ht 65.0 in | Wt 324.0 lb

## 2017-04-18 DIAGNOSIS — I200 Unstable angina: Secondary | ICD-10-CM

## 2017-04-18 DIAGNOSIS — I2510 Atherosclerotic heart disease of native coronary artery without angina pectoris: Secondary | ICD-10-CM

## 2017-04-18 DIAGNOSIS — I5032 Chronic diastolic (congestive) heart failure: Secondary | ICD-10-CM

## 2017-04-18 DIAGNOSIS — E782 Mixed hyperlipidemia: Secondary | ICD-10-CM | POA: Diagnosis not present

## 2017-04-18 MED ORDER — FUROSEMIDE 40 MG PO TABS: 60.0000 mg | ORAL_TABLET | Freq: Every day | ORAL | 3 refills | Status: DC

## 2017-04-18 MED ORDER — NITROGLYCERIN 0.4 MG SL SUBL: 0.4000 mg | SUBLINGUAL_TABLET | SUBLINGUAL | 5 refills | Status: AC | PRN

## 2017-04-18 NOTE — Patient Instructions (Signed)
Medication Instructions:  1) INCREASE LASIX to 60 mg daily  Labwork: None  Testing/Procedures: None  Follow-Up: Your provider wants you to follow-up in: 1 year with Dr. Burt Knack. You will receive a reminder letter in the mail two months in advance. If you don't receive a letter, please call our office to schedule the follow-up appointment.    Any Other Special Instructions Will Be Listed Below (If Applicable).     If you need a refill on your cardiac medications before your next appointment, please call your pharmacy.

## 2017-04-18 NOTE — Progress Notes (Signed)
Cardiology Office Note Date:  04/18/2017   ID:  Caitlyn, Trujillo 12-21-1950, MRN [id number]  PCP:  Ria Bush, MD  Cardiologist:  Sherren Mocha, MD    Chief Complaint  Patient presents with  . Shortness of Breath     History of Present Illness: [name] is a 66 y.o. female who presents for follow-up of shortness of breath.  The patient has a history of morbid obesity and diabetes.  She has had intermittent chest pain and shortness of breath.  She had an abnormal stress test and underwent cardiac catheterization in February 2018.  She was found to have minor nonobstructive coronary artery disease, normal LV systolic function, and elevated LVEDP.  Medical therapy was recommended.  The patient is here alone today.  She reports ongoing edema and shortness of breath with activity.  No major change in symptoms.  No recent chest pain.  No orthopnea or PND.  She has started doing some water aerobics in the last week.   Past Medical History:  Diagnosis Date  . Anxiety   . Arthritis   . C. difficile colitis 02/13/2014  . CAD (coronary artery disease)    nonobstructive plaque by cath 2013  . Chronic diastolic CHF (congestive heart failure) ([name]) 08/2011   EF 55-60%, nl valves  . Colitis   . Complication of anesthesia    hard time waking up 02 sats 85-89% range   . Depression   . Fatty liver 2006   [place]  . Fibula fracture   . Fracture of metacarpal shaft of left hand, closed 09/21/2016   Dr Sabra Heck  . GERD (gastroesophageal reflux disease)   . Hyperlipidemia   . Hypersomnia with sleep apnea, unspecified 01/08/2014  . Hypertension   . Hypothyroidism   . Microscopic colitis 01/09/2014  . Morbid obesity ([name])    seen dietician 09/2011  . Myelolipoma 10/2010   stable L adrenal myelolipoma  . 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 OSA (obstructive sleep apnea)    medium quattro full face mask CPAP 10cm  . Osteoarthritis of left knee 2016   nontraumatic  loose body with locking, primary localized OA [name])  . Rectal polyp   . RLS (restless legs syndrome)   . T2DM (type 2 diabetes mellitus) ([name])   . Vitamin B 12 deficiency    IF negative  . Vitamin D deficiency    last check 68 (11/2010)    Past Surgical History:  Procedure Laterality Date  . CARDIAC CATHETERIZATION  10/12/09   EF 70%  . CARDIAC CATHETERIZATION  08/23/2011   Mild nonobstructive CAD, normal LV fxn  . CARDIAC CATHETERIZATION  06/2016   mild nonobstructive CAD Burt Knack)  . COLONOSCOPY WITH PROPOFOL N/A 12/21/2015   HP, focal chronic active colitis Caitlyn Mayer, MD)  . ESOPHAGOGASTRODUODENOSCOPY  2006  . KNEE SURGERY Left 11-02-2008   Partial medial and lateral Menisectomies and Chondroplasty (Dr. Noemi Trujillo)  . LEFT HEART CATH AND CORONARY ANGIOGRAPHY N/A 06/23/2016   Procedure: Left Heart Cath and Coronary Angiography;  Surgeon: Sherren Mocha, MD;  Location: [facility];  Service: Cardiovascular;  Laterality: N/A;  . LEFT HEART CATHETERIZATION WITH CORONARY ANGIOGRAM N/A 08/23/2011   Procedure: LEFT HEART CATHETERIZATION WITH CORONARY ANGIOGRAM;  Surgeon: Sherren Mocha, MD;  Location: [facility] CATH [facility];  Service: Cardiovascular;  Laterality: N/A;  . MR ABDOMEN  01/16/2011   Left 4cm adrenal myelolipoma  . ORIF METACARPAL FRACTURE Left 10/2016  [name]  . OSTEOTOMY AND ULNAR SHORTENING  2001   [name]  . US ECHOCARDIOGRAPHY  08/2011   EF of 55-60% and indeterminate diastolic function  . VENTRAL HERNIA REPAIR  03/01/2012   Procedure: LAPAROSCOPIC VENTRAL HERNIA;  Surgeon: Caitlyn Ok, MD;  Location: [facility];  Service: General;  Laterality: N/A;    Current Outpatient Medications  Medication Sig Dispense Refill  . ALPRAZolam (XANAX) 0.5 MG tablet Take 0.5 mg by mouth 3 (three) times daily as needed for anxiety.   2  . aspirin EC 81 MG tablet Take 81 mg by mouth once a week.     [name] atorvastatin (LIPITOR) 40 MG tablet TAKE 1 TABLET BY MOUTH AT BEDTIME 90 tablet 0  .  cyanocobalamin (,VITAMIN B-12,) 1000 MCG/ML injection Inject 1 mL (1,000 mcg total) into the muscle every 30 (thirty) days. 10 mL 3  . furosemide (LASIX) 40 MG tablet Take 1.5 tablets (60 mg total) by mouth daily. 135 tablet 3  . gabapentin (NEURONTIN) 400 MG capsule Take 400-1,200 mg by mouth 3 (three) times daily. Take one capsule in the morning, one capsule 7 hours later and then three capsules at bedtime.  0  . glimepiride (AMARYL) 1 MG tablet Take 1 tablet (1 mg total) by mouth daily with breakfast. 30 tablet 6  . KLOR-CON M10 10 MEQ tablet TAKE 2 TABLETS BY MOUTH EVERY DAY 180 tablet 0  . lamoTRIgine (LAMICTAL) 200 MG tablet Take 200 mg by mouth daily.    [name] levothyroxine (SYNTHROID, LEVOTHROID) 112 MCG tablet TAKE 2 TABLETS BY MOUTH EVERY DAY BEOFRE BREAKFAST 60 tablet 10  . metFORMIN (GLUCOPHAGE) 850 MG tablet TAKE 1 TABLET BY MOUTH TWICE DAILY WITH A MEAL 180 tablet 3  . nystatin ointment (MYCOSTATIN) Apply 1 application daily as needed topically. Apply to red areas in groin 30 g 1  . omeprazole (PRILOSEC) 40 MG capsule TAKE ONE CAPSULE BY MOUTH EVERY DAY 30 capsule 3  . potassium chloride (K-DUR) 10 MEQ tablet TAKE 2 TABLETS(20 MEQ) BY MOUTH DAILY 180 tablet 1  . ramipril (ALTACE) 10 MG capsule TAKE 1 CAPSULE BY MOUTH EVERY DAY 90 capsule 1  . rOPINIRole (REQUIP) 3 MG tablet TAKE 1 TABLET (3 MG TOTAL) BY MOUTH AT BEDTIME. 90 tablet 0  . triamcinolone cream (KENALOG) 0.1 % APPLY TO AFFECTED AREA ON EXTERNAL EAR TWICE DAILY. DO NOT USE FOR MORE THAN 2 WEEKS AT A TIME 30 g 0  . Vilazodone HCl (VIIBRYD) 40 MG TABS Take 40 mg by mouth every morning.    . nitroGLYCERIN (NITROSTAT) 0.4 MG SL tablet Place 1 tablet (0.4 mg total) under the tongue every 5 (five) minutes as needed for up to 25 days for chest pain. 25 tablet 5   No current facility-administered medications for this visit.     Allergies:   Azithromycin; Augmentin [amoxicillin-pot clavulanate]; Erythromycin base; and Neomycin    Social History:  The patient  reports that she quit smoking about 19 years ago. she has never used smokeless tobacco. She reports that she does not drink alcohol or use drugs.   Family History:  The patient's  family history includes Coronary artery disease (age of onset: 63) in her mother; Diabetes in her mother; Heart attack (age of onset: 51) in her brother; Heart attack (age of onset: 44) in her father; Stroke in her mother.    ROS:  Please see the history of present illness.  Otherwise, review of systems is positive for leg swelling, shortness of  breath with activity, abdominal pain, diarrhea, depression, muscle pain, rash, easy bruising, excessive fatigue, leg pain, wheezing, balance problems.  All other systems are reviewed and negative.    PHYSICAL EXAM: VS:  BP 126/80   Pulse 71   Ht 5\' 5"  (1.651 m)   Wt (!) 324 lb (147 kg)   SpO2 92%   BMI 53.92 kg/m  , BMI Body mass index is 53.92 kg/m. GEN: pleasant morbidly obese woman, in no acute distress  HEENT: normal  Neck: no JVD, no masses. No carotid bruits Cardiac: RRR without murmur or gallop                Respiratory:  clear to auscultation bilaterally, normal work of breathing GI: soft, nontender, nondistended, + BS MS: no deformity or atrophy  Ext: 1+ bilateral pretibial edema, pedal pulses 2+= bilaterally Skin: warm and dry, no rash Neuro:  Strength and sensation are intact Psych: euthymic mood, full affect  EKG:  EKG is ordered today. The ekg ordered today shows normal sinus rhythm 71 bpm, within normal limits.  Recent Labs: 06/20/2016: Hemoglobin 12.8; Platelets 252.0 09/13/2016: ALT 23 01/17/2017: BUN 16; Creatinine, Ser 0.79; Potassium 4.2; Sodium 141; TSH 1.27   Lipid Panel     Component Value Date/Time   CHOL 133 01/17/2017 1011   CHOL 159 08/17/2010   TRIG 182.0 (H) 01/17/2017 1011   TRIG 108 08/17/2010   HDL 40.90 01/17/2017 1011   CHOLHDL 3 01/17/2017 1011   VLDL 36.4 01/17/2017 1011   LDLCALC 56  01/17/2017 1011   LDLCALC 77 08/17/2010   LDLDIRECT 77.0 09/13/2016 1204      Wt Readings from Last 3 Encounters:  04/18/17 (!) 324 lb (147 kg)  01/22/17 (!) 335 lb 4 oz (152.1 kg)  01/17/17 (!) 336 lb (152.4 kg)     Cardiac Studies Reviewed: Cath 06-23-2016: Conclusion   1. Minor nonobstructive CAD 2. Mildly elevated LVEDP 3. Normal LV systolic function by noninvasive assessment  Recommend: Medical therapy for nonobstructive CAD, lifestyle modification  Indications   Abnormal nuclear cardiac imaging test [R93.1 (ICD-10-CM)]  Procedural Details/Technique   Technical Details INDICATION: Chest pain with abnormal nuclear stress test  PROCEDURAL DETAILS: The right wrist was prepped, draped, and anesthetized with 1% lidocaine. Using the modified Seldinger technique, a 5/6 French Slender sheath was introduced into the right radial artery. 3 mg of verapamil was administered through the sheath, weight-based unfractionated heparin was administered intravenously. Standard Judkins catheters were used for selective coronary angiography. Left ventricular pressure is recorded with the JR4 catheter. A pullback across the aortic valve is performed. Catheter exchanges were performed over an exchange length guidewire. There were no immediate procedural complications. A TR band was used for radial hemostasis at the completion of the procedure. The patient was transferred to the post catheterization recovery area for further monitoring.    Estimated blood loss <50 mL.  During this procedure the patient was administered the following to achieve and maintain moderate conscious sedation: Versed 2 mg, Fentanyl 75 mcg, while the patient's heart rate, blood pressure, and oxygen saturation were continuously monitored. The period of conscious sedation was 23 minutes, of which I was present face-to-face 100% of this time.  Coronary Findings   Diagnostic  Dominance: Right  Left Anterior Descending  The  vessel exhibits minimal luminal irregularities.  First Diagonal Branch  Ost 1st Diag to 1st Diag lesion 30% stenosed  There is mild stenosis at the ostium of the first diagonal  Ramus Intermedius  Vessel is small.  Left Circumflex  Vessel is angiographically normal.  Right Coronary Artery  Vessel is angiographically normal.  Intervention   No interventions have been documented.  Coronary Diagrams   Diagnostic Diagram          ASSESSMENT AND PLAN: 1.  Edema: Discussed management options.  Recommend increase furosemide to 60 mg daily.  She will continue to work on diet, weight loss, and a low sodium of.  2.  Hypertension: Continue ramipril.  Blood pressure is controlled.  3.  Hyperlipidemia: Continue atorvastatin.  Most recent labs reviewed.  4.  Type 2 diabetes: Managed by her primary physician.  5.  Morbid obesity with BMI greater than 40: Discussed weight loss strategies.  Discussed consideration of bariatric surgery.  She is considering options.  She has been scared to move forward with weight loss surgery.  Current medicines are reviewed with the patient today.  The patient does not have concerns regarding medicines.  Labs/ tests ordered today include:   Orders Placed This Encounter  Procedures  . EKG 12-Lead    Disposition:   FU one year  Signed, Sherren Mocha, MD  04/18/2017 1:34 PM    [facility] [address], [place], [place]  [address] Phone: [phone]; Fax: [phone]

## 2017-04-19 DIAGNOSIS — F341 Dysthymic disorder: Secondary | ICD-10-CM | POA: Diagnosis not present

## 2017-04-23 ENCOUNTER — Ambulatory Visit: Payer: Medicare HMO | Admitting: Family Medicine

## 2017-05-03 DIAGNOSIS — F341 Dysthymic disorder: Secondary | ICD-10-CM | POA: Diagnosis not present

## 2017-05-05 ENCOUNTER — Other Ambulatory Visit: Payer: Self-pay | Admitting: Family Medicine

## 2017-05-13 DIAGNOSIS — J019 Acute sinusitis, unspecified: Secondary | ICD-10-CM | POA: Diagnosis not present

## 2017-05-21 DIAGNOSIS — F341 Dysthymic disorder: Secondary | ICD-10-CM | POA: Diagnosis not present

## 2017-05-25 ENCOUNTER — Ambulatory Visit: Payer: Medicare Other | Admitting: Family Medicine

## 2017-05-25 ENCOUNTER — Ambulatory Visit: Payer: Medicare HMO | Admitting: Family Medicine

## 2017-06-02 ENCOUNTER — Other Ambulatory Visit: Payer: Self-pay | Admitting: Family Medicine

## 2017-06-07 ENCOUNTER — Telehealth: Payer: Self-pay

## 2017-06-07 ENCOUNTER — Encounter: Payer: Self-pay | Admitting: Family Medicine

## 2017-06-07 ENCOUNTER — Ambulatory Visit: Payer: Medicare Other | Admitting: Family Medicine

## 2017-06-07 NOTE — Telephone Encounter (Signed)
PLEASE NOTE: All timestamps contained within this report are represented as [place] Standard Time. CONFIDENTIALTY NOTICE: This fax transmission is intended only for the addressee. It contains information that is legally privileged, confidential or otherwise protected from use or disclosure. If you are not the intended recipient, you are strictly prohibited from reviewing, disclosing, copying using or disseminating any of this information or taking any action in reliance on or regarding this information. If you have received this fax in error, please notify us immediately by telephone so that we can arrange for its return to [place]. Phone: [phone], Toll-Free: [phone], Fax: [phone] Page: 1 of 1 Call Id: [id number] [place] Night - Client Nonclinical Telephone Record [place] Night - Client Client Site [place] Physician Ria Bush - MD Contact Type Call Who Is Calling Patient / Member / Family / Caregiver Caller Name Sheridyn Canino Caller Phone Number [phone] Patient Name [name] Patient DOB 06/17/1950 Call Type Message Only Information Provided Reason for Call Request to [place] Appointment Initial Comment Caller states she has an appointment in the morning at 9am, she is vomiting and is refusing to speak to a nurse, just wanting to cancel appointment. Additional Comment Call Closed By: Nancy Nordmann Transaction Date/Time: 06/07/2017 12:10:44 AM (ET)

## 2017-06-07 NOTE — Telephone Encounter (Signed)
I spoke with pt to see if she wanted to keep 06/07/17 appt with Dr Darnell Level at [facility] for vomiting rather than f/u. Pt said she does not think she could drive to appt so she will cb and reschedule f/u appt. Please see pt email also. Pt has cancelled x 2 previously for this f/u appt; do you want to charge a late cancellation fee?

## 2017-06-07 NOTE — Telephone Encounter (Signed)
plz don't charge. Thanks.

## 2017-06-08 ENCOUNTER — Other Ambulatory Visit: Payer: Self-pay | Admitting: Family Medicine

## 2017-06-09 ENCOUNTER — Other Ambulatory Visit: Payer: Self-pay | Admitting: Family Medicine

## 2017-06-09 DIAGNOSIS — J0100 Acute maxillary sinusitis, unspecified: Secondary | ICD-10-CM | POA: Diagnosis not present

## 2017-06-11 ENCOUNTER — Other Ambulatory Visit: Payer: Self-pay | Admitting: Family Medicine

## 2017-06-14 DIAGNOSIS — F331 Major depressive disorder, recurrent, moderate: Secondary | ICD-10-CM | POA: Diagnosis not present

## 2017-06-14 DIAGNOSIS — F4312 Post-traumatic stress disorder, chronic: Secondary | ICD-10-CM | POA: Diagnosis not present

## 2017-06-14 DIAGNOSIS — F3181 Bipolar II disorder: Secondary | ICD-10-CM | POA: Diagnosis not present

## 2017-06-15 ENCOUNTER — Ambulatory Visit (INDEPENDENT_AMBULATORY_CARE_PROVIDER_SITE_OTHER): Payer: Medicare Other | Admitting: Family Medicine

## 2017-06-15 ENCOUNTER — Encounter: Payer: Self-pay | Admitting: Family Medicine

## 2017-06-15 VITALS — BP 122/80 | HR 76 | Temp 98.2°F | Wt 327.2 lb

## 2017-06-15 DIAGNOSIS — E538 Deficiency of other specified B group vitamins: Secondary | ICD-10-CM | POA: Diagnosis not present

## 2017-06-15 DIAGNOSIS — I5032 Chronic diastolic (congestive) heart failure: Secondary | ICD-10-CM

## 2017-06-15 DIAGNOSIS — J209 Acute bronchitis, unspecified: Secondary | ICD-10-CM | POA: Diagnosis not present

## 2017-06-15 MED ORDER — CYANOCOBALAMIN 1000 MCG/ML IJ SOLN
1000.0000 ug | Freq: Once | INTRAMUSCULAR | Status: AC
  Administered 2017-06-15: 1000 ug via INTRAMUSCULAR

## 2017-06-15 NOTE — Progress Notes (Signed)
BP 122/80 (BP Location: Left Arm, Patient Position: Sitting, Cuff Size: Normal) Comment (BP Location): Lower arm  Pulse 76   Temp 98.2 F (36.8 C) (Oral)   Wt (!) 327 lb 4 oz (148.4 kg)   SpO2 96%   BMI 54.46 kg/m    CC: cough, wheezing, requests B12 injection Subjective:    Patient ID: [name], female    DOB: 04/03/1951, 67 y.o.   MRN: [id number]  HPI: [name] is a 67 y.o. female presenting on 06/15/2017 for Cough (Started 06/08/17. Treated for uri with abx. Tried Mucinex, not helpful. ); Wheezing; and B12 Injection (Requests inj)   1wk h/o chest congestion, productive cough of yellow sputum, wheezing and dyspnea. Treating with mucinex. [facility] with [facility]. Wanted to get checked out today to ensure she is recovering. Cough is keeping her up at night time.   Had similar illness 3 wks ago that did resolve.   She has tried mucinex OTC.  + sick contacts at home.  Ex smoker No h/o asthma.   Has missed multiple visits in the past few months due to GI illness etc. overdue for DM f/u.  She will schedule f/u visit.  Relevant past medical, surgical, family and social history reviewed and updated as indicated. Interim medical history since our last visit reviewed. Allergies and medications reviewed and updated. Outpatient Medications Prior to Visit  Medication Sig Dispense Refill  . ALPRAZolam (XANAX) 0.5 MG tablet Take 0.5 mg by mouth 3 (three) times daily as needed for anxiety. Takes 1/2 tablet in AM, then 1/2 tablet at noon, 1 tablet at bedtime as needed  2  . aspirin EC 81 MG tablet Take 81 mg by mouth once a week.     Marland Kitchen atorvastatin (LIPITOR) 40 MG tablet TAKE 1 TABLET BY MOUTH AT BEDTIME 90 tablet 0  . cyanocobalamin (,VITAMIN B-12,) 1000 MCG/ML injection Inject 1 mL (1,000 mcg total) into the muscle every 30 (thirty) days. 10 mL 3  . furosemide (LASIX) 40 MG tablet Take 1.5 tablets (60 mg total) by mouth daily. 135 tablet 3    . gabapentin (NEURONTIN) 400 MG capsule Take 400-1,200 mg by mouth 3 (three) times daily. Take one capsule in the morning, one capsule 7 hours later and then three capsules at bedtime.  0  . glimepiride (AMARYL) 1 MG tablet Take 1 tablet (1 mg total) by mouth daily with breakfast. 30 tablet 6  . KLOR-CON M10 10 MEQ tablet TAKE 2 TABLETS BY MOUTH EVERY DAY 180 tablet 0  . lamoTRIgine (LAMICTAL) 200 MG tablet Take 200 mg by mouth daily.    Marland Kitchen levothyroxine (SYNTHROID, LEVOTHROID) 112 MCG tablet TAKE 2 TABLETS BY MOUTH EVERY DAY BEOFRE BREAKFAST 60 tablet 10  . metFORMIN (GLUCOPHAGE) 850 MG tablet TAKE 1 TABLET BY MOUTH TWICE DAILY WITH A MEAL 180 tablet 3  . nystatin ointment (MYCOSTATIN) Apply 1 application daily as needed topically. Apply to red areas in groin 30 g 1  . omeprazole (PRILOSEC) 40 MG capsule TAKE ONE CAPSULE BY MOUTH EVERY DAY 30 capsule 8  . potassium chloride (K-DUR) 10 MEQ tablet TAKE 2 TABLETS(20 MEQ) BY MOUTH DAILY 180 tablet 1  . ramipril (ALTACE) 10 MG capsule TAKE ONE CAPSULE BY MOUTH EVERY DAY 90 capsule 2  . rOPINIRole (REQUIP) 3 MG tablet TAKE 1 TABLET (3 MG TOTAL) BY MOUTH AT BEDTIME. 90 tablet 0  . triamcinolone cream (KENALOG) 0.1 % APPLY TO  AFFECTED AREA ON EXTERNAL EAR TWICE DAILY. DO NOT USE FOR MORE THAN 2 WEEKS AT A TIME 30 g 0  . Vilazodone HCl (VIIBRYD) 40 MG TABS Take 40 mg by mouth every morning.    . nitroGLYCERIN (NITROSTAT) 0.4 MG SL tablet Place 1 tablet (0.4 mg total) under the tongue every 5 (five) minutes as needed for up to 25 days for chest pain. 25 tablet 5   No facility-administered medications prior to visit.      Per HPI unless specifically indicated in ROS section below Review of Systems     Objective:    BP 122/80 (BP Location: Left Arm, Patient Position: Sitting, Cuff Size: Normal) Comment (BP Location): Lower arm  Pulse 76   Temp 98.2 F (36.8 C) (Oral)   Wt (!) 327 lb 4 oz (148.4 kg)   SpO2 96%   BMI 54.46 kg/m   Wt Readings from  Last 3 Encounters:  06/15/17 (!) 327 lb 4 oz (148.4 kg)  04/18/17 (!) 324 lb (147 kg)  01/22/17 (!) 335 lb 4 oz (152.1 kg)    Physical Exam  Constitutional: She appears well-developed and well-nourished. No distress.  HENT:  Head: Normocephalic and atraumatic.  Right Ear: Hearing, external ear and ear canal normal.  Left Ear: Hearing, external ear and ear canal normal.  Nose: Mucosal edema (nasal mucosal congestion without erythema) and rhinorrhea present. Right sinus exhibits no maxillary sinus tenderness and no frontal sinus tenderness. Left sinus exhibits no maxillary sinus tenderness and no frontal sinus tenderness.  Mouth/Throat: Uvula is midline and mucous membranes are normal. Posterior oropharyngeal edema and posterior oropharyngeal erythema present. No oropharyngeal exudate or tonsillar abscesses.  Cerumen in bilateral canals Dry mucous membranes  Eyes: Conjunctivae and EOM are normal. Pupils are equal, round, and reactive to light. No scleral icterus.  Neck: Normal range of motion. Neck supple.  Cardiovascular: Normal rate, regular rhythm, normal heart sounds and intact distal pulses.  No murmur heard. Pulmonary/Chest: Effort normal and breath sounds normal. No respiratory distress. She has no decreased breath sounds. She has no wheezes. She has no rhonchi. She has no rales.  Transmitted upper airway wheezing  Lymphadenopathy:    She has no cervical adenopathy.  Skin: Skin is warm and dry. No rash noted.  Nursing note and vitals reviewed.     Assessment & Plan:   Problem List Items Addressed This Visit    Acute bronchitis - Primary    She has completed zpack course through [facility]. Overall improving. Benign exam today. Continued supportive care reviewed. Pt declines Rx cough syrup at this time.       Chronic diastolic heart failure (HCC)   Vitamin B12 deficiency    B12 shot today per patient request.          Follow up plan: Return if symptoms worsen or fail to  improve.  Ria Bush, MD

## 2017-06-15 NOTE — Patient Instructions (Signed)
b12 shot today I think lungs are sounding ok today - should continue to improve each day. Push fluids and rest. May continue mucinex with large glass of water to help mobilize mucous out.  If fever >101, worsening productive cough, or not improving as expected, return for recheck.

## 2017-06-15 NOTE — Assessment & Plan Note (Deleted)
B12 shot today per patient request

## 2017-06-15 NOTE — Addendum Note (Signed)
Addended by: Brenton Grills on: 05/20/5798 63:49 AM   Modules accepted: Orders

## 2017-06-15 NOTE — Assessment & Plan Note (Signed)
B12 shot today per patient request.

## 2017-06-15 NOTE — Assessment & Plan Note (Signed)
She has completed [facility]pack course through [facility]. Overall improving. Benign exam today. Continued supportive care reviewed. Pt declines Rx cough syrup at this time.

## 2017-06-19 DIAGNOSIS — F341 Dysthymic disorder: Secondary | ICD-10-CM | POA: Diagnosis not present

## 2017-06-20 DIAGNOSIS — M25561 Pain in right knee: Secondary | ICD-10-CM | POA: Diagnosis not present

## 2017-06-20 DIAGNOSIS — M25562 Pain in left knee: Secondary | ICD-10-CM | POA: Diagnosis not present

## 2017-07-05 ENCOUNTER — Other Ambulatory Visit: Payer: Self-pay | Admitting: Family Medicine

## 2017-07-06 ENCOUNTER — Ambulatory Visit: Payer: Medicare Other | Admitting: Family Medicine

## 2017-07-14 ENCOUNTER — Other Ambulatory Visit: Payer: Self-pay | Admitting: Cardiovascular Disease

## 2017-07-14 DIAGNOSIS — J101 Influenza due to other identified influenza virus with other respiratory manifestations: Secondary | ICD-10-CM | POA: Diagnosis not present

## 2017-07-14 DIAGNOSIS — E039 Hypothyroidism, unspecified: Secondary | ICD-10-CM | POA: Diagnosis not present

## 2017-07-14 DIAGNOSIS — Z7982 Long term (current) use of aspirin: Secondary | ICD-10-CM | POA: Diagnosis not present

## 2017-07-14 DIAGNOSIS — R112 Nausea with vomiting, unspecified: Secondary | ICD-10-CM | POA: Diagnosis not present

## 2017-07-14 DIAGNOSIS — R0902 Hypoxemia: Secondary | ICD-10-CM | POA: Diagnosis not present

## 2017-07-14 DIAGNOSIS — J9600 Acute respiratory failure, unspecified whether with hypoxia or hypercapnia: Secondary | ICD-10-CM | POA: Diagnosis not present

## 2017-07-14 DIAGNOSIS — Z87891 Personal history of nicotine dependence: Secondary | ICD-10-CM | POA: Diagnosis not present

## 2017-07-14 DIAGNOSIS — G2581 Restless legs syndrome: Secondary | ICD-10-CM | POA: Diagnosis not present

## 2017-07-14 DIAGNOSIS — K219 Gastro-esophageal reflux disease without esophagitis: Secondary | ICD-10-CM | POA: Diagnosis not present

## 2017-07-14 DIAGNOSIS — E785 Hyperlipidemia, unspecified: Secondary | ICD-10-CM | POA: Diagnosis not present

## 2017-07-14 DIAGNOSIS — E1151 Type 2 diabetes mellitus with diabetic peripheral angiopathy without gangrene: Secondary | ICD-10-CM | POA: Diagnosis not present

## 2017-07-14 DIAGNOSIS — F419 Anxiety disorder, unspecified: Secondary | ICD-10-CM | POA: Diagnosis not present

## 2017-07-14 DIAGNOSIS — I10 Essential (primary) hypertension: Secondary | ICD-10-CM | POA: Diagnosis not present

## 2017-07-14 DIAGNOSIS — E1140 Type 2 diabetes mellitus with diabetic neuropathy, unspecified: Secondary | ICD-10-CM | POA: Diagnosis not present

## 2017-07-14 DIAGNOSIS — I2510 Atherosclerotic heart disease of native coronary artery without angina pectoris: Secondary | ICD-10-CM | POA: Diagnosis not present

## 2017-07-14 DIAGNOSIS — Z79899 Other long term (current) drug therapy: Secondary | ICD-10-CM | POA: Diagnosis not present

## 2017-07-14 DIAGNOSIS — I5032 Chronic diastolic (congestive) heart failure: Secondary | ICD-10-CM | POA: Diagnosis not present

## 2017-07-14 DIAGNOSIS — J209 Acute bronchitis, unspecified: Secondary | ICD-10-CM | POA: Diagnosis not present

## 2017-07-14 DIAGNOSIS — R0602 Shortness of breath: Secondary | ICD-10-CM | POA: Diagnosis not present

## 2017-07-14 DIAGNOSIS — G4733 Obstructive sleep apnea (adult) (pediatric): Secondary | ICD-10-CM | POA: Diagnosis not present

## 2017-07-15 DIAGNOSIS — E785 Hyperlipidemia, unspecified: Secondary | ICD-10-CM | POA: Diagnosis not present

## 2017-07-15 DIAGNOSIS — J209 Acute bronchitis, unspecified: Secondary | ICD-10-CM | POA: Diagnosis not present

## 2017-07-15 DIAGNOSIS — I2510 Atherosclerotic heart disease of native coronary artery without angina pectoris: Secondary | ICD-10-CM | POA: Diagnosis not present

## 2017-07-15 DIAGNOSIS — F419 Anxiety disorder, unspecified: Secondary | ICD-10-CM | POA: Diagnosis not present

## 2017-07-15 DIAGNOSIS — J9600 Acute respiratory failure, unspecified whether with hypoxia or hypercapnia: Secondary | ICD-10-CM | POA: Diagnosis not present

## 2017-07-15 DIAGNOSIS — E1140 Type 2 diabetes mellitus with diabetic neuropathy, unspecified: Secondary | ICD-10-CM | POA: Diagnosis not present

## 2017-07-15 DIAGNOSIS — E039 Hypothyroidism, unspecified: Secondary | ICD-10-CM | POA: Diagnosis not present

## 2017-07-15 DIAGNOSIS — K219 Gastro-esophageal reflux disease without esophagitis: Secondary | ICD-10-CM | POA: Diagnosis not present

## 2017-07-15 DIAGNOSIS — G4733 Obstructive sleep apnea (adult) (pediatric): Secondary | ICD-10-CM | POA: Diagnosis not present

## 2017-07-15 DIAGNOSIS — I10 Essential (primary) hypertension: Secondary | ICD-10-CM | POA: Diagnosis not present

## 2017-07-15 DIAGNOSIS — G2581 Restless legs syndrome: Secondary | ICD-10-CM | POA: Diagnosis not present

## 2017-07-15 DIAGNOSIS — J101 Influenza due to other identified influenza virus with other respiratory manifestations: Secondary | ICD-10-CM | POA: Diagnosis not present

## 2017-07-16 ENCOUNTER — Other Ambulatory Visit: Payer: Self-pay | Admitting: Family Medicine

## 2017-07-16 DIAGNOSIS — F419 Anxiety disorder, unspecified: Secondary | ICD-10-CM | POA: Diagnosis not present

## 2017-07-16 DIAGNOSIS — E1140 Type 2 diabetes mellitus with diabetic neuropathy, unspecified: Secondary | ICD-10-CM | POA: Diagnosis not present

## 2017-07-16 DIAGNOSIS — J9600 Acute respiratory failure, unspecified whether with hypoxia or hypercapnia: Secondary | ICD-10-CM | POA: Diagnosis not present

## 2017-07-16 DIAGNOSIS — K219 Gastro-esophageal reflux disease without esophagitis: Secondary | ICD-10-CM | POA: Diagnosis not present

## 2017-07-16 DIAGNOSIS — E785 Hyperlipidemia, unspecified: Secondary | ICD-10-CM | POA: Diagnosis not present

## 2017-07-16 DIAGNOSIS — I10 Essential (primary) hypertension: Secondary | ICD-10-CM | POA: Diagnosis not present

## 2017-07-16 DIAGNOSIS — J209 Acute bronchitis, unspecified: Secondary | ICD-10-CM | POA: Diagnosis not present

## 2017-07-16 DIAGNOSIS — I2510 Atherosclerotic heart disease of native coronary artery without angina pectoris: Secondary | ICD-10-CM | POA: Diagnosis not present

## 2017-07-16 DIAGNOSIS — E039 Hypothyroidism, unspecified: Secondary | ICD-10-CM | POA: Diagnosis not present

## 2017-07-16 DIAGNOSIS — G4733 Obstructive sleep apnea (adult) (pediatric): Secondary | ICD-10-CM | POA: Diagnosis not present

## 2017-07-16 DIAGNOSIS — G2581 Restless legs syndrome: Secondary | ICD-10-CM | POA: Diagnosis not present

## 2017-07-16 DIAGNOSIS — J101 Influenza due to other identified influenza virus with other respiratory manifestations: Secondary | ICD-10-CM | POA: Diagnosis not present

## 2017-07-16 NOTE — Telephone Encounter (Signed)
Please review for refill. Thanks!  

## 2017-07-16 NOTE — Telephone Encounter (Signed)
Medication Detail    Disp Refills Start End   furosemide (LASIX) 40 MG tablet 135 tablet 3 04/18/2017 04/13/2018   Sig - Route: Take 1.5 tablets (60 mg total) by mouth daily. - Oral   Sent to pharmacy as: furosemide (LASIX) 40 MG tablet   Notes to Pharmacy: **Patient requests 90 days supply**   E-Prescribing Status: Receipt confirmed by pharmacy (04/18/2017 12:57 PM EST)   Pharmacy   [id number] [address], [place]

## 2017-07-18 ENCOUNTER — Encounter: Payer: Self-pay | Admitting: Family Medicine

## 2017-07-19 ENCOUNTER — Ambulatory Visit: Payer: Medicare Other | Admitting: Family Medicine

## 2017-07-19 NOTE — Telephone Encounter (Signed)
CC Erin for [facility] feedback, plz also call pt to schedule appointment.

## 2017-07-24 ENCOUNTER — Ambulatory Visit (INDEPENDENT_AMBULATORY_CARE_PROVIDER_SITE_OTHER): Payer: Medicare Other | Admitting: Family Medicine

## 2017-07-24 ENCOUNTER — Encounter: Payer: Self-pay | Admitting: Family Medicine

## 2017-07-24 ENCOUNTER — Ambulatory Visit: Payer: Medicare Other | Admitting: Family Medicine

## 2017-07-24 VITALS — BP 122/70 | HR 68 | Temp 98.9°F | Wt 321.0 lb

## 2017-07-24 DIAGNOSIS — E1165 Type 2 diabetes mellitus with hyperglycemia: Secondary | ICD-10-CM | POA: Diagnosis not present

## 2017-07-24 DIAGNOSIS — E1140 Type 2 diabetes mellitus with diabetic neuropathy, unspecified: Secondary | ICD-10-CM | POA: Diagnosis not present

## 2017-07-24 DIAGNOSIS — J111 Influenza due to unidentified influenza virus with other respiratory manifestations: Secondary | ICD-10-CM | POA: Diagnosis not present

## 2017-07-24 DIAGNOSIS — H6121 Impacted cerumen, right ear: Secondary | ICD-10-CM

## 2017-07-24 DIAGNOSIS — E538 Deficiency of other specified B group vitamins: Secondary | ICD-10-CM | POA: Diagnosis not present

## 2017-07-24 DIAGNOSIS — Z6841 Body Mass Index (BMI) 40.0 and over, adult: Secondary | ICD-10-CM | POA: Diagnosis not present

## 2017-07-24 DIAGNOSIS — IMO0002 Reserved for concepts with insufficient information to code with codable children: Secondary | ICD-10-CM

## 2017-07-24 DIAGNOSIS — H6120 Impacted cerumen, unspecified ear: Secondary | ICD-10-CM | POA: Insufficient documentation

## 2017-07-24 HISTORY — DX: Influenza due to unidentified influenza virus with other respiratory manifestations: J11.1

## 2017-07-24 MED ORDER — CYANOCOBALAMIN 1000 MCG/ML IJ SOLN
1000.0000 ug | Freq: Once | INTRAMUSCULAR | Status: AC
  Administered 2017-07-24: 1000 ug via INTRAMUSCULAR

## 2017-07-24 NOTE — Assessment & Plan Note (Signed)
No recent checks, sugars were elevated on recent hospitalization - likely from prednisone use. Reports compliance with medications - declines trial extended release metformin at this time. Will return in 6 wks for DM check.

## 2017-07-24 NOTE — Progress Notes (Signed)
BP 122/70 (BP Location: Left Arm, Patient Position: Sitting, Cuff Size: Large)   Pulse 68   Temp 98.9 F (37.2 C) (Oral)   Wt (!) 321 lb (145.6 kg)   SpO2 95%   BMI 53.42 kg/m    CC: hosp f/u visit Subjective:    Patient ID: [name], female    DOB: July 23, 1950, 67 y.o.   MRN: [id number]  HPI: [name] is a 67 y.o. female presenting on 07/24/2017 for Hospitalization Follow-up (Seen at [facility] in [place], [place] on 07/14/17, dx shortness of breath) and Injections (Requests vit B12)   Recent trip to [place] to visit daughter, ended up hospitalized with influenza A and bronchitis with acute respiratory failure. Reviewed records via care everywhere. Treated with tamiflu, duoneb treatments and prednisone. She did need oxygen supplementation, but no longer was oxygen requiring upon discharge. Since home has been using albuterol Q4hrs PRN, less frequent recently. No fevers, improving dyspnea. Persistent coughing but improving each day.   She did take the flu shot.  She has given up sugar for lent.  DM - tolerating metformin 850mg  bid but endorses loose stools. Checks sugars fasting in the morning. Doesn't remember glucose meter. She may be interested in gastric bypass surgery.   Requests b12 shot today  Admission Date: 07/14/2017  Discharge Date: 07/16/2017 No f/u [facility] phone call completed  Primary Discharge Diagnosis Acute respiratory failure POA resolved Influenza A Acute bronchitis POA  Secondary Discharge Diagnosis CHF OSA Diabetes type 2 Hypothyroidism Hyperlipidemia Hypertension Restless leg syndrome GERD Anxiety  Discharge Disposition Home Code Status at Discharge: Full  Relevant past medical, surgical, family and social history reviewed and updated as indicated. Interim medical history since our last visit reviewed. Allergies and medications reviewed and updated. Outpatient Medications Prior to Visit  Medication Sig Dispense  Refill  . ALPRAZolam (XANAX) 0.5 MG tablet Take 0.5 mg by mouth 3 (three) times daily as needed for anxiety. Takes 1/2 tablet in AM, then 1/2 tablet at noon, 1 tablet at bedtime as needed  2  . aspirin EC 81 MG tablet Take 81 mg by mouth once a week.     Marland Kitchen atorvastatin (LIPITOR) 40 MG tablet TAKE 1 TABLET BY MOUTH AT BEDTIME 90 tablet 1  . cyanocobalamin (,VITAMIN B-12,) 1000 MCG/ML injection Inject 1 mL (1,000 mcg total) into the muscle every 30 (thirty) days. 10 mL 3  . furosemide (LASIX) 40 MG tablet Take 1.5 tablets (60 mg total) by mouth daily. 135 tablet 3  . gabapentin (NEURONTIN) 400 MG capsule Take 400-1,200 mg by mouth 3 (three) times daily. Take one capsule in the morning, one capsule 7 hours later and then three capsules at bedtime.  0  . glimepiride (AMARYL) 1 MG tablet TAKE 1 TABLET (1 MG TOTAL) BY MOUTH DAILY WITH BREAKFAST. 30 tablet 0  . KLOR-CON M10 10 MEQ tablet TAKE 2 TABLETS BY MOUTH EVERY DAY 180 tablet 0  . lamoTRIgine (LAMICTAL) 200 MG tablet Take 200 mg by mouth daily.    Marland Kitchen levothyroxine (SYNTHROID, LEVOTHROID) 112 MCG tablet TAKE 2 TABLETS BY MOUTH EVERY DAY BEOFRE BREAKFAST 60 tablet 10  . metFORMIN (GLUCOPHAGE) 850 MG tablet TAKE 1 TABLET BY MOUTH TWICE DAILY WITH A MEAL 180 tablet 3  . nystatin ointment (MYCOSTATIN) Apply 1 application daily as needed topically. Apply to red areas in groin 30 g 1  . omeprazole (PRILOSEC) 40 MG capsule TAKE ONE CAPSULE BY MOUTH EVERY DAY 30 capsule 8  .  potassium chloride (K-DUR) 10 MEQ tablet TAKE 2 TABLETS(20 MEQ) BY MOUTH DAILY 180 tablet 1  . ramipril (ALTACE) 10 MG capsule TAKE ONE CAPSULE BY MOUTH EVERY DAY 90 capsule 2  . rOPINIRole (REQUIP) 3 MG tablet TAKE 1 TABLET (3 MG TOTAL) BY MOUTH AT BEDTIME. 90 tablet 0  . triamcinolone cream (KENALOG) 0.1 % APPLY TO AFFECTED AREA ON EXTERNAL EAR TWICE DAILY. DO NOT USE FOR MORE THAN 2 WEEKS AT A TIME 30 g 0  . Vilazodone HCl (VIIBRYD) 40 MG TABS Take 40 mg by mouth every morning.    .  nitroGLYCERIN (NITROSTAT) 0.4 MG SL tablet Place 1 tablet (0.4 mg total) under the tongue every 5 (five) minutes as needed for up to 25 days for chest pain. 25 tablet 5   No facility-administered medications prior to visit.      Per HPI unless specifically indicated in ROS section below Review of Systems     Objective:    BP 122/70 (BP Location: Left Arm, Patient Position: Sitting, Cuff Size: Large)   Pulse 68   Temp 98.9 F (37.2 C) (Oral)   Wt (!) 321 lb (145.6 kg)   SpO2 95%   BMI 53.42 kg/m   Wt Readings from Last 3 Encounters:  07/24/17 (!) 321 lb (145.6 kg)  06/15/17 (!) 327 lb 4 oz (148.4 kg)  04/18/17 (!) 324 lb (147 kg)    Physical Exam  Constitutional: She appears well-developed and well-nourished. No distress.  HENT:  Head: Normocephalic and atraumatic.  Right Ear: Hearing, external ear and ear canal normal.  Left Ear: Hearing, tympanic membrane, external ear and ear canal normal.  Nose: No mucosal edema or rhinorrhea. Right sinus exhibits no maxillary sinus tenderness and no frontal sinus tenderness. Left sinus exhibits no maxillary sinus tenderness and no frontal sinus tenderness.  Mouth/Throat: Uvula is midline, oropharynx is clear and moist and mucous membranes are normal. No oropharyngeal exudate, posterior oropharyngeal edema, posterior oropharyngeal erythema or tonsillar abscesses.  R TM covered by cerumen Irrigation performed to right ear  Eyes: Conjunctivae and EOM are normal. Pupils are equal, round, and reactive to light. No scleral icterus.  Neck: Normal range of motion. Neck supple.  Cardiovascular: Normal rate, regular rhythm, normal heart sounds and intact distal pulses.  No murmur heard. Pulmonary/Chest: Effort normal and breath sounds normal. No respiratory distress. She has no wheezes. She has no rales.  lungs clear, cough persists  Lymphadenopathy:    She has no cervical adenopathy.  Skin: Skin is warm and dry. No rash noted.  Nursing note and  vitals reviewed.      Assessment & Plan:   Problem List Items Addressed This Visit    Cerumen impaction    Affecting hearing - s/p irrigation today and pt tolerated well.       Influenzal bronchitis - Primary    Records reviewed. Recovering well from recent influenza bronchitis. Further supportive care reviewed, albuterol use reviewed. Expected course of recovery discussed. Reviewed red flags to seek further care.       Morbid obesity with BMI of 50.0-59.9, adult ([facility])    Pt interested in discussion for bariatric surgery. # provided to call and schedule bariatric seminar appointment. Will further discuss at f/u visit.       Type 2 diabetes mellitus, uncontrolled, with neuropathy (HCC)    No recent checks, sugars were elevated on recent hospitalization - likely from prednisone use. Reports compliance with medications - declines trial extended release metformin at  this time. Will return in 6 wks for DM check.       Vitamin B12 deficiency    B12 shot today.       Relevant Medications   cyanocobalamin ((VITAMIN B-12)) injection 1,000 mcg (Completed)       Meds ordered this encounter  Medications  . cyanocobalamin ((VITAMIN B-12)) injection 1,000 mcg   No orders of the defined types were placed in this encounter.   Follow up plan: Return in about 6 weeks (around 09/04/2017) for follow up visit.  Ria Bush, MD

## 2017-07-24 NOTE — Assessment & Plan Note (Addendum)
Pt interested in discussion for bariatric surgery. # provided to call and schedule bariatric seminar appointment. Will further discuss at f/u visit.

## 2017-07-24 NOTE — Patient Instructions (Addendum)
I'm glad you're doing better! I expect continued improvement each day. Let us know if not improving as expected. Check with insurance about new freestyle libre sensor meters.  Check on [facility] bariatric seminar for bariatric surgery ([phone]).  Return in 6 weeks for diabetes follow up visit.  Right ear irrigation performed today. b12 shot today

## 2017-07-24 NOTE — Assessment & Plan Note (Signed)
Records reviewed. Recovering well from recent influenza bronchitis. Further supportive care reviewed, albuterol use reviewed. Expected course of recovery discussed. Reviewed red flags to seek further care.

## 2017-07-24 NOTE — Assessment & Plan Note (Addendum)
Affecting hearing - s/p irrigation today and pt tolerated well.

## 2017-07-24 NOTE — Assessment & Plan Note (Signed)
B12 shot today. 

## 2017-07-26 DIAGNOSIS — F341 Dysthymic disorder: Secondary | ICD-10-CM | POA: Diagnosis not present

## 2017-08-14 DIAGNOSIS — F341 Dysthymic disorder: Secondary | ICD-10-CM | POA: Diagnosis not present

## 2017-08-16 ENCOUNTER — Other Ambulatory Visit: Payer: Self-pay | Admitting: Family Medicine

## 2017-08-28 DIAGNOSIS — F341 Dysthymic disorder: Secondary | ICD-10-CM | POA: Diagnosis not present

## 2017-09-07 ENCOUNTER — Ambulatory Visit: Payer: Medicare Other | Admitting: Family Medicine

## 2017-09-08 ENCOUNTER — Other Ambulatory Visit: Payer: Self-pay | Admitting: Family Medicine

## 2017-09-09 ENCOUNTER — Other Ambulatory Visit: Payer: Self-pay | Admitting: Family Medicine

## 2017-09-10 DIAGNOSIS — F341 Dysthymic disorder: Secondary | ICD-10-CM | POA: Diagnosis not present

## 2017-09-13 ENCOUNTER — Ambulatory Visit: Payer: Medicare Other | Admitting: Family Medicine

## 2017-09-25 ENCOUNTER — Encounter: Payer: Self-pay | Admitting: Family Medicine

## 2017-09-25 ENCOUNTER — Ambulatory Visit (INDEPENDENT_AMBULATORY_CARE_PROVIDER_SITE_OTHER): Payer: Medicare Other | Admitting: Family Medicine

## 2017-09-25 VITALS — BP 134/73 | HR 73 | Temp 98.2°F | Ht 65.0 in | Wt 326.5 lb

## 2017-09-25 DIAGNOSIS — Z23 Encounter for immunization: Secondary | ICD-10-CM

## 2017-09-25 DIAGNOSIS — E1140 Type 2 diabetes mellitus with diabetic neuropathy, unspecified: Secondary | ICD-10-CM

## 2017-09-25 DIAGNOSIS — E1165 Type 2 diabetes mellitus with hyperglycemia: Secondary | ICD-10-CM

## 2017-09-25 DIAGNOSIS — Z6841 Body Mass Index (BMI) 40.0 and over, adult: Secondary | ICD-10-CM

## 2017-09-25 DIAGNOSIS — E538 Deficiency of other specified B group vitamins: Secondary | ICD-10-CM | POA: Diagnosis not present

## 2017-09-25 DIAGNOSIS — F341 Dysthymic disorder: Secondary | ICD-10-CM | POA: Diagnosis not present

## 2017-09-25 DIAGNOSIS — IMO0002 Reserved for concepts with insufficient information to code with codable children: Secondary | ICD-10-CM

## 2017-09-25 LAB — POCT GLYCOSYLATED HEMOGLOBIN (HGB A1C): HEMOGLOBIN A1C: 7.8

## 2017-09-25 MED ORDER — CYANOCOBALAMIN 1000 MCG/ML IJ SOLN
1000.0000 ug | Freq: Once | INTRAMUSCULAR | Status: AC
  Administered 2017-09-25: 1000 ug via INTRAMUSCULAR

## 2017-09-25 MED ORDER — POTASSIUM CHLORIDE ER 10 MEQ PO TBCR: 20.0000 meq | EXTENDED_RELEASE_TABLET | Freq: Every day | ORAL | 1 refills | Status: DC

## 2017-09-25 MED ORDER — METFORMIN HCL ER 500 MG PO TB24: 1000.0000 mg | ORAL_TABLET | Freq: Every day | ORAL | 11 refills | Status: DC

## 2017-09-25 NOTE — Patient Instructions (Addendum)
B12 shot today.  Pneumonia shot today (prevnar).  Try to schedule monthly b12 shots.  Change from immediate release metformin to extended release - start at 1 500mg  tablet in the morning with breakfast for 1 week, if tolerating well may increase to 2 500mg  tablets with breakfast.  Schedule eye doctor appointment.  Return in 2-3 months for follow up visit.

## 2017-09-25 NOTE — Assessment & Plan Note (Signed)
Chronic. She has started looking into bariatric surgery options, planning to meet with surgeon.

## 2017-09-25 NOTE — Progress Notes (Signed)
BP 134/73 (BP Location: Left Arm, Patient Position: Sitting, Cuff Size: Large)   Pulse 73   Temp 98.2 F (36.8 C) (Oral)   Ht 5\' 5"  (1.651 m)   Wt (!) 326 lb 8 oz (148.1 kg)   SpO2 93%   BMI 54.33 kg/m    CC: 6 wk f/u visit Subjective:    Patient ID: [name], female    DOB: November 30, 1950, 67 y.o.   MRN: [id number]  HPI: [name] is a 67 y.o. female presenting on 09/25/2017 for Diabetes (Here for 6 wk follow-up.)   Vit B12 deficiency - not on oral supplementation. Forgets monthly shots. Due for one today.   DM - does not regularly check sugars. Compliant with antihyperglycemic regimen which includes: metformin 850mg  bid and glimepiride 1mg  daily. Ongoing loose stools. Denies hypoglycemic symptoms. + paresthesias. Last diabetic eye exam DUE. Pneumovax: 2012. Prevnar: due. Glucometer brand: doesn't know. DSME: she did complete this.  Lab Results  Component Value Date   HGBA1C 7.8 09/25/2017   Diabetic Foot Exam - Simple   Simple Foot Form Diabetic Foot exam was performed with the following findings:  Yes 09/25/2017  8:47 AM  Visual Inspection No deformities, no ulcerations, no other skin breakdown bilaterally:  Yes Sensation Testing See comments:  Yes Pulse Check Posterior Tibialis and Dorsalis pulse intact bilaterally:  Yes Comments Diminished sensation to monofilament at soles    Lab Results  Component Value Date   MICROALBUR 1.9 09/29/2014    May be interested in gastric bypass surgery - # previously provided. She went to bariatric seminar, planning to meet with psychologist and gen surgeon to further discuss.  Relevant past medical, surgical, family and social history reviewed and updated as indicated. Interim medical history since our last visit reviewed. Allergies and medications reviewed and updated. Outpatient Medications Prior to Visit  Medication Sig Dispense Refill  . ALPRAZolam (XANAX) 0.5 MG tablet Take 0.5 mg by mouth 3 (three) times daily as  needed for anxiety. Takes 1/2 tablet in AM, then 1/2 tablet at noon, 1 tablet at bedtime as needed  2  . aspirin EC 81 MG tablet Take 81 mg by mouth once a week.     Marland Kitchen atorvastatin (LIPITOR) 40 MG tablet TAKE 1 TABLET BY MOUTH AT BEDTIME 90 tablet 1  . cyanocobalamin (,VITAMIN B-12,) 1000 MCG/ML injection Inject 1 mL (1,000 mcg total) into the muscle every 30 (thirty) days. 10 mL 3  . furosemide (LASIX) 40 MG tablet Take 1.5 tablets (60 mg total) by mouth daily. 135 tablet 3  . gabapentin (NEURONTIN) 400 MG capsule Take 400-1,200 mg by mouth 3 (three) times daily. Take one capsule in the morning, one capsule 7 hours later and then three capsules at bedtime.  0  . glimepiride (AMARYL) 1 MG tablet TAKE 1 TABLET (1 MG TOTAL) BY MOUTH DAILY WITH BREAKFAST. 30 tablet 5  . lamoTRIgine (LAMICTAL) 200 MG tablet Take 200 mg by mouth daily.    Marland Kitchen levothyroxine (SYNTHROID, LEVOTHROID) 112 MCG tablet TAKE 2 TABLETS BY MOUTH EVERY DAY BEOFRE BREAKFAST 60 tablet 5  . nystatin ointment (MYCOSTATIN) Apply 1 application daily as needed topically. Apply to red areas in groin 30 g 1  . omeprazole (PRILOSEC) 40 MG capsule TAKE ONE CAPSULE BY MOUTH EVERY DAY 30 capsule 8  . ramipril (ALTACE) 10 MG capsule TAKE ONE CAPSULE BY MOUTH EVERY DAY 90 capsule 2  . rOPINIRole (REQUIP) 3 MG tablet TAKE 1 TABLET (3 MG TOTAL)  BY MOUTH AT BEDTIME. 90 tablet 1  . triamcinolone cream (KENALOG) 0.1 % APPLY TO AFFECTED AREA ON EXTERNAL EAR TWICE DAILY. DO NOT USE FOR MORE THAN 2 WEEKS AT A TIME 30 g 0  . Vilazodone HCl (VIIBRYD) 40 MG TABS Take 40 mg by mouth every morning.    Marland Kitchen KLOR-CON M10 10 MEQ tablet TAKE 2 TABLETS BY MOUTH EVERY DAY 180 tablet 1  . metFORMIN (GLUCOPHAGE) 850 MG tablet TAKE 1 TABLET BY MOUTH TWICE DAILY WITH A MEAL 180 tablet 3  . potassium chloride (K-DUR) 10 MEQ tablet TAKE 2 TABLETS(20 MEQ) BY MOUTH DAILY 180 tablet 1  . nitroGLYCERIN (NITROSTAT) 0.4 MG SL tablet Place 1 tablet (0.4 mg total) under the tongue  every 5 (five) minutes as needed for up to 25 days for chest pain. 25 tablet 5   No facility-administered medications prior to visit.      Per HPI unless specifically indicated in ROS section below Review of Systems     Objective:    BP 134/73 (BP Location: Left Arm, Patient Position: Sitting, Cuff Size: Large)   Pulse 73   Temp 98.2 F (36.8 C) (Oral)   Ht 5\' 5"  (1.651 m)   Wt (!) 326 lb 8 oz (148.1 kg)   SpO2 93%   BMI 54.33 kg/m   Wt Readings from Last 3 Encounters:  09/25/17 (!) 326 lb 8 oz (148.1 kg)  07/24/17 (!) 321 lb (145.6 kg)  06/15/17 (!) 327 lb 4 oz (148.4 kg)    Physical Exam  Constitutional: She appears well-developed and well-nourished. No distress.  HENT:  Head: Normocephalic and atraumatic.  Right Ear: External ear normal.  Left Ear: External ear normal.  Nose: Nose normal.  Mouth/Throat: Oropharynx is clear and moist. No oropharyngeal exudate.  Eyes: Pupils are equal, round, and reactive to light. Conjunctivae and EOM are normal. No scleral icterus.  Neck: Normal range of motion. Neck supple.  Cardiovascular: Normal rate, regular rhythm, normal heart sounds and intact distal pulses.  No murmur heard. Pulmonary/Chest: Effort normal and breath sounds normal. No respiratory distress. She has no wheezes. She has no rales.  Musculoskeletal: She exhibits no edema.  See HPI for foot exam if done  Lymphadenopathy:    She has no cervical adenopathy.  Skin: Skin is warm and dry. No rash noted.  Psychiatric: She has a normal mood and affect.  Nursing note and vitals reviewed.  Results for orders placed or performed in visit on 09/25/17  POCT glycosylated hemoglobin (Hb A1C)  Result Value Ref Range   Hemoglobin A1C 7.8       Assessment & Plan:   Problem List Items Addressed This Visit    Morbid obesity with BMI of 50.0-59.9, adult (HCC)    Chronic. She has started looking into bariatric surgery options, planning to meet with surgeon.       Relevant  Medications   metFORMIN (GLUCOPHAGE XR) 500 MG 24 hr tablet   Type 2 diabetes mellitus, uncontrolled, with neuropathy (HCC) - Primary    Chronic, update A1c today. IR metformin may be causing ongoing GI distress - will try XR metformin 500mg  --> 1000mg  daily. RTC 3 mo DM f/u visit. Encouraged she schedule eye exam as due.       Relevant Medications   metFORMIN (GLUCOPHAGE XR) 500 MG 24 hr tablet   Other Relevant Orders   POCT glycosylated hemoglobin (Hb A1C) (Completed)   Vitamin B12 deficiency    B12 shot today. Oral  replacement previously ineffective.       Relevant Medications   cyanocobalamin ((VITAMIN B-12)) injection 1,000 mcg (Completed)    Other Visit Diagnoses    Need for vaccination with 13-polyvalent pneumococcal conjugate vaccine       Relevant Orders   Pneumococcal conjugate vaccine 13-valent IM (Completed)       Meds ordered this encounter  Medications  . cyanocobalamin ((VITAMIN B-12)) injection 1,000 mcg  . metFORMIN (GLUCOPHAGE XR) 500 MG 24 hr tablet    Sig: Take 2 tablets (1,000 mg total) by mouth daily with breakfast.    Dispense:  60 tablet    Refill:  11  . potassium chloride (K-DUR) 10 MEQ tablet    Sig: Take 2 tablets (20 mEq total) by mouth daily.    Dispense:  180 tablet    Refill:  1   Orders Placed This Encounter  Procedures  . Pneumococcal conjugate vaccine 13-valent IM  . POCT glycosylated hemoglobin (Hb A1C)    Follow up plan: Return in about 3 months (around 12/26/2017) for follow up visit.  Ria Bush, MD

## 2017-09-25 NOTE — Assessment & Plan Note (Signed)
B12 shot today. Oral replacement previously ineffective.

## 2017-09-25 NOTE — Assessment & Plan Note (Addendum)
Chronic, update A1c today. IR metformin may be causing ongoing GI distress - will try XR metformin 500mg  --> 1000mg  daily. RTC 3 mo DM f/u visit. Encouraged she schedule eye exam as due.

## 2017-10-09 DIAGNOSIS — F341 Dysthymic disorder: Secondary | ICD-10-CM | POA: Diagnosis not present

## 2017-10-23 DIAGNOSIS — F341 Dysthymic disorder: Secondary | ICD-10-CM | POA: Diagnosis not present

## 2017-10-25 ENCOUNTER — Encounter: Payer: Self-pay | Admitting: Family Medicine

## 2017-10-25 DIAGNOSIS — IMO0002 Reserved for concepts with insufficient information to code with codable children: Secondary | ICD-10-CM

## 2017-10-25 DIAGNOSIS — E1140 Type 2 diabetes mellitus with diabetic neuropathy, unspecified: Secondary | ICD-10-CM

## 2017-10-25 DIAGNOSIS — H47092 Other disorders of optic nerve, not elsewhere classified, left eye: Secondary | ICD-10-CM | POA: Diagnosis not present

## 2017-10-25 DIAGNOSIS — H2513 Age-related nuclear cataract, bilateral: Secondary | ICD-10-CM | POA: Diagnosis not present

## 2017-10-25 DIAGNOSIS — E1165 Type 2 diabetes mellitus with hyperglycemia: Principal | ICD-10-CM

## 2017-10-25 LAB — HM DIABETES EYE EXAM

## 2017-10-29 MED ORDER — METFORMIN HCL 850 MG PO TABS: 850.0000 mg | ORAL_TABLET | Freq: Two times a day (BID) | ORAL | 11 refills | Status: DC

## 2017-11-19 ENCOUNTER — Other Ambulatory Visit: Payer: Self-pay | Admitting: Family Medicine

## 2017-11-19 DIAGNOSIS — Z1231 Encounter for screening mammogram for malignant neoplasm of breast: Secondary | ICD-10-CM

## 2017-11-19 NOTE — Progress Notes (Deleted)
* [facility]     Assessment and Plan:   IMPRESSION: 1) Moderate OSA with severe desaturation @ baseline 2) Obesity  3) Left chest wall bruise with pinpoint tenderness. Asked to take OTC meds such as motrin or aleve.   PLAN: 1) Continue to use CPAP every night.  2) Weight loss would be beneficial.    Date: 11/19/2017  MRN# [id number] [name] Mar 12, 1951   [name] is a 67 y.o. old female seen in follow up for chief complaint of  No chief complaint on file.    HPI:  67 y.o. F with OSA first diagnosed in 2011 on CPAP since then. Significant weight gain of at least 50# since initial diagnosis. Persistent daytime hypersomnolence  DATA: 01/04/16 Split night study: AHI 32/hr, lowest SpO2 67%, recommended setting 8 cm H2O with nasal pillows 11/18-12/17/17 CPAP compliance: Usage 27/30 night, >4 hrs 25 nights, <4 hrs 2 night 5/20-6/18/18 CPAP compliance. Usage 26/30 days. Greater than 4 hours on 23 days. Average usage 6 hours 28 minutes on days used. AutoSet 8-11. 95th percentile pressure 10.6. Residual AHI 2.4.  Medication:    Current Outpatient Medications:  .  ALPRAZolam (XANAX) 0.5 MG tablet, Take 0.5 mg by mouth 3 (three) times daily as needed for anxiety. Takes 1/2 tablet in AM, then 1/2 tablet at noon, 1 tablet at bedtime as needed, Disp: , Rfl: 2 .  aspirin EC 81 MG tablet, Take 81 mg by mouth once a week. , Disp: , Rfl:  .  atorvastatin (LIPITOR) 40 MG tablet, TAKE 1 TABLET BY MOUTH AT BEDTIME, Disp: 90 tablet, Rfl: 1 .  cyanocobalamin (,VITAMIN B-12,) 1000 MCG/ML injection, Inject 1 mL (1,000 mcg total) into the muscle every 30 (thirty) days., Disp: 10 mL, Rfl: 3 .  furosemide (LASIX) 40 MG tablet, Take 1.5 tablets (60 mg total) by mouth daily., Disp: 135 tablet, Rfl: 3 .  gabapentin (NEURONTIN) 400 MG capsule, Take 400-1,200 mg by mouth 3 (three) times daily. Take one capsule in the morning, one capsule 7 hours later and then three  capsules at bedtime., Disp: , Rfl: 0 .  glimepiride (AMARYL) 1 MG tablet, TAKE 1 TABLET (1 MG TOTAL) BY MOUTH DAILY WITH BREAKFAST., Disp: 30 tablet, Rfl: 5 .  lamoTRIgine (LAMICTAL) 200 MG tablet, Take 200 mg by mouth daily., Disp: , Rfl:  .  levothyroxine (SYNTHROID, LEVOTHROID) 112 MCG tablet, TAKE 2 TABLETS BY MOUTH EVERY DAY BEOFRE BREAKFAST, Disp: 60 tablet, Rfl: 5 .  metFORMIN (GLUCOPHAGE) 850 MG tablet, Take 1 tablet (850 mg total) by mouth 2 (two) times daily with a meal., Disp: 60 tablet, Rfl: 11 .  nitroGLYCERIN (NITROSTAT) 0.4 MG SL tablet, Place 1 tablet (0.4 mg total) under the tongue every 5 (five) minutes as needed for up to 25 days for chest pain., Disp: 25 tablet, Rfl: 5 .  nystatin ointment (MYCOSTATIN), Apply 1 application daily as needed topically. Apply to red areas in groin, Disp: 30 g, Rfl: 1 .  omeprazole (PRILOSEC) 40 MG capsule, TAKE ONE CAPSULE BY MOUTH EVERY DAY, Disp: 30 capsule, Rfl: 8 .  potassium chloride (K-DUR) 10 MEQ tablet, Take 2 tablets (20 mEq total) by mouth daily., Disp: 180 tablet, Rfl: 1 .  ramipril (ALTACE) 10 MG capsule, TAKE ONE CAPSULE BY MOUTH EVERY DAY, Disp: 90 capsule, Rfl: 2 .  rOPINIRole (REQUIP) 3 MG tablet, TAKE 1 TABLET (3 MG TOTAL) BY MOUTH AT BEDTIME., Disp: 90 tablet, Rfl: 1 .  triamcinolone cream (  KENALOG) 0.1 %, APPLY TO AFFECTED AREA ON EXTERNAL EAR TWICE DAILY. DO NOT USE FOR MORE THAN 2 WEEKS AT A TIME, Disp: 30 g, Rfl: 0 .  Vilazodone HCl (VIIBRYD) 40 MG TABS, Take 40 mg by mouth every morning., Disp: , Rfl:    Allergies:  Augmentin [amoxicillin-pot clavulanate]; Erythromycin base; and Neomycin  Review of Systems:   Physical Examination:   VS: There were no vitals taken for this visit.     LABORATORY PANEL:   CBC No results for input(s): WBC, HGB, HCT, PLT in the last 168 hours. ------------------------------------------------------------------------------------------------------------------  Chemistries  No results  for input(s): NA, K, CL, CO2, GLUCOSE, BUN, CREATININE, CALCIUM, MG, AST, ALT, ALKPHOS, BILITOT in the last 168 hours.  Invalid input(s): GFRCGP ------------------------------------------------------------------------------------------------------------------  Cardiac Enzymes No results for input(s): TROPONINI in the last 168 hours. ------------------------------------------------------------  RADIOLOGY:   No results found for this or any previous visit. Results for orders placed in visit on 09/23/15  DG Chest 2 View   Narrative CLINICAL DATA:  Chest pain after falling onto a bench striking the chest. History of coronary artery disease and chronic CHF, morbid obesity, former smoker.  EXAM: CHEST  2 VIEW  COMPARISON:  Chest x-ray of November 27, 2012  FINDINGS: The lungs are adequately inflated. There is no focal infiltrate. There is no pneumothorax or pleural effusion. The heart and pulmonary vascularity are normal. The trachea is midline. The bony thorax exhibits no acute abnormality.  IMPRESSION: No post traumatic abnormality of the thorax is observed. There is degenerative disc change at multiple thoracic levels. There is no acute cardiopulmonary abnormality.   Electronically Signed   By: David  Martinique M.D.   On: 09/24/2015 09:19    ------------------------------------------------------------------------------------------------------------------  Thank  you for allowing [facility] to assist in the care of your patient. Our recommendations are noted above.  Please contact us if we can be of further service.   Marda Stalker, M.D., F.C.C.P.  Board Certified in Internal Medicine, Pulmonary Medicine, [place], and Sleep Medicine.  [facility] Office Number: [phone]  11/19/2017

## 2017-11-20 ENCOUNTER — Ambulatory Visit: Payer: Medicare Other | Admitting: Internal Medicine

## 2017-11-25 ENCOUNTER — Encounter: Payer: Self-pay | Admitting: Internal Medicine

## 2017-11-27 ENCOUNTER — Ambulatory Visit (INDEPENDENT_AMBULATORY_CARE_PROVIDER_SITE_OTHER): Payer: Medicare Other | Admitting: Internal Medicine

## 2017-11-27 ENCOUNTER — Encounter: Payer: Self-pay | Admitting: Internal Medicine

## 2017-11-27 VITALS — BP 138/84 | HR 71 | Resp 16 | Ht 65.0 in | Wt 328.0 lb

## 2017-11-27 DIAGNOSIS — G4733 Obstructive sleep apnea (adult) (pediatric): Secondary | ICD-10-CM

## 2017-11-27 DIAGNOSIS — G2581 Restless legs syndrome: Secondary | ICD-10-CM

## 2017-11-27 DIAGNOSIS — H47323 Drusen of optic disc, bilateral: Secondary | ICD-10-CM | POA: Diagnosis not present

## 2017-11-27 NOTE — Progress Notes (Signed)
* [facility]     Assessment and Plan:   IMPRESSION: OSA, controlled with cpap.  RLS.  Continue daytime sleepiness, likely medication effect from lamictal and requip.  Obesity.   PLAN: 1) Continue to use CPAP every night.  2) Weight loss would be beneficial.     Date: 11/27/2017  MRN# [id number] [name] 12-11-1950   [name] is a 67 y.o. old female seen in follow up for chief complaint of  Chief Complaint  Patient presents with  . Sleep Apnea     HPI:  67 y.o. F with OSA first diagnosed in 2011 on CPAP since then. She notes that she is doing well with cpap, using it every night. She may take a nap during the day, she may still fall asleep during the day when reading.  She takes xanax 2 to 3 times per month. Takes lamictal daily for bipolar disorder, takes requip every nightly for RLS and it appears to help the symptoms.   DATA: 01/04/16 Split night study: AHI 32/hr, lowest SpO2 67%, recommended setting 8 cm H2O with nasal pillows 11/18-12/17/17 CPAP compliance: Usage 27/30 night, >4 hrs 25 nights, <4 hrs 2 night 5/20-6/18/18 CPAP compliance. Usage 26/30 days. Greater than 4 hours on 23 days. Average usage 6 hours 28 minutes on days used. AutoSet 8-11. 95th percentile pressure 10.6. Residual AHI 2.4. **Review of download data 10/27/2017 to 11/25/2017>> data personally reviewed, uses greater than 4 hours 29/30 days.  Average usage on days used is 8 hours 10 minutes.  Pressure ranges 8-11.  Median pressure 9, 95th percentile pressure 10, maximum pressure 11.  Residual AHI is 2.1.  Overall this shows excellent compliance with CPAP with excellent control of obstructive sleep apnea.  Medication:    Current Outpatient Medications:  .  ALPRAZolam (XANAX) 0.5 MG tablet, Take 0.5 mg by mouth 3 (three) times daily as needed for anxiety. Takes 1/2 tablet in AM, then 1/2 tablet at noon, 1 tablet at bedtime as needed, Disp: , Rfl: 2 .  aspirin EC 81 MG  tablet, Take 81 mg by mouth once a week. , Disp: , Rfl:  .  atorvastatin (LIPITOR) 40 MG tablet, TAKE 1 TABLET BY MOUTH AT BEDTIME, Disp: 90 tablet, Rfl: 1 .  cyanocobalamin (,VITAMIN B-12,) 1000 MCG/ML injection, Inject 1 mL (1,000 mcg total) into the muscle every 30 (thirty) days., Disp: 10 mL, Rfl: 3 .  furosemide (LASIX) 40 MG tablet, Take 1.5 tablets (60 mg total) by mouth daily., Disp: 135 tablet, Rfl: 3 .  gabapentin (NEURONTIN) 400 MG capsule, Take 400-1,200 mg by mouth 3 (three) times daily. Take one capsule in the morning, one capsule 7 hours later and then three capsules at bedtime., Disp: , Rfl: 0 .  glimepiride (AMARYL) 1 MG tablet, TAKE 1 TABLET (1 MG TOTAL) BY MOUTH DAILY WITH BREAKFAST., Disp: 30 tablet, Rfl: 5 .  lamoTRIgine (LAMICTAL) 200 MG tablet, Take 200 mg by mouth daily., Disp: , Rfl:  .  levothyroxine (SYNTHROID, LEVOTHROID) 112 MCG tablet, TAKE 2 TABLETS BY MOUTH EVERY DAY BEOFRE BREAKFAST, Disp: 60 tablet, Rfl: 5 .  metFORMIN (GLUCOPHAGE) 850 MG tablet, Take 1 tablet (850 mg total) by mouth 2 (two) times daily with a meal., Disp: 60 tablet, Rfl: 11 .  nystatin ointment (MYCOSTATIN), Apply 1 application daily as needed topically. Apply to red areas in groin, Disp: 30 g, Rfl: 1 .  omeprazole (PRILOSEC) 40 MG capsule, TAKE ONE CAPSULE BY MOUTH EVERY DAY,  Disp: 30 capsule, Rfl: 8 .  potassium chloride (K-DUR) 10 MEQ tablet, Take 2 tablets (20 mEq total) by mouth daily., Disp: 180 tablet, Rfl: 1 .  ramipril (ALTACE) 10 MG capsule, TAKE ONE CAPSULE BY MOUTH EVERY DAY, Disp: 90 capsule, Rfl: 2 .  rOPINIRole (REQUIP) 3 MG tablet, TAKE 1 TABLET (3 MG TOTAL) BY MOUTH AT BEDTIME., Disp: 90 tablet, Rfl: 1 .  triamcinolone cream (KENALOG) 0.1 %, APPLY TO AFFECTED AREA ON EXTERNAL EAR TWICE DAILY. DO NOT USE FOR MORE THAN 2 WEEKS AT A TIME, Disp: 30 g, Rfl: 0 .  Vilazodone HCl (VIIBRYD) 40 MG TABS, Take 40 mg by mouth every morning., Disp: , Rfl:  .  nitroGLYCERIN (NITROSTAT) 0.4 MG SL  tablet, Place 1 tablet (0.4 mg total) under the tongue every 5 (five) minutes as needed for up to 25 days for chest pain., Disp: 25 tablet, Rfl: 5   Allergies:  Augmentin [amoxicillin-pot clavulanate]; Erythromycin base; and Neomycin  Review of Systems:  Constitutional: Feels well. Cardiovascular: No chest pain.  Pulmonary: Denies dyspnea.   The remainder of systems were reviewed and were found to be negative other than what is documented in the HPI.     Physical Examination:   VS: BP 138/84 (BP Location: Left Arm, Cuff Size: Large)   Pulse 71   Resp 16   Ht 5\' 5"  (1.651 m)   Wt (!) 328 lb (148.8 kg)   SpO2 95%   BMI 54.58 kg/m   General Appearance: No distress  Neuro:without focal findings, mental status, speech normal, alert and oriented HEENT: PERRLA, EOM intact Pulmonary: No wheezing, No rales  CardiovascularNormal S1,S2.  No m/r/g.  Abdomen: Benign, Soft, non-tender, No masses Renal:  No costovertebral tenderness  GU:  No performed at this time. Endoc: No evident thyromegaly, no signs of acromegaly or Cushing features Skin:   warm, no rashes, no ecchymosis  Extremities: normal, no cyanosis, clubbing.      LABORATORY PANEL:   CBC No results for input(s): WBC, HGB, HCT, PLT in the last 168 hours. ------------------------------------------------------------------------------------------------------------------  Chemistries  No results for input(s): NA, K, CL, CO2, GLUCOSE, BUN, CREATININE, CALCIUM, MG, AST, ALT, ALKPHOS, BILITOT in the last 168 hours.  Invalid input(s): GFRCGP ------------------------------------------------------------------------------------------------------------------  Cardiac Enzymes No results for input(s): TROPONINI in the last 168 hours. ------------------------------------------------------------  RADIOLOGY:   No results found for this or any previous visit. Results for orders placed in visit on 09/23/15  DG Chest 2 View    Narrative CLINICAL DATA:  Chest pain after falling onto a bench striking the chest. History of coronary artery disease and chronic CHF, morbid obesity, former smoker.  EXAM: CHEST  2 VIEW  COMPARISON:  Chest x-ray of November 27, 2012  FINDINGS: The lungs are adequately inflated. There is no focal infiltrate. There is no pneumothorax or pleural effusion. The heart and pulmonary vascularity are normal. The trachea is midline. The bony thorax exhibits no acute abnormality.  IMPRESSION: No post traumatic abnormality of the thorax is observed. There is degenerative disc change at multiple thoracic levels. There is no acute cardiopulmonary abnormality.   Electronically Signed   By: David  Martinique M.D.   On: 09/24/2015 09:19    ------------------------------------------------------------------------------------------------------------------  Thank  you for allowing [facility] to assist in the care of your patient. Our recommendations are noted above.  Please contact us if we can be of further service.   Marda Stalker, M.D., F.C.C.P.  Board Certified in Internal Medicine,  Pulmonary Medicine, Critical Care Medicine, and Sleep Medicine.  [facility] Office Number: [phone]  11/27/2017

## 2017-11-27 NOTE — Patient Instructions (Signed)
Continue using cpap every night.  Continue requip.

## 2017-12-02 ENCOUNTER — Other Ambulatory Visit: Payer: Self-pay | Admitting: Cardiovascular Disease

## 2017-12-04 DIAGNOSIS — F341 Dysthymic disorder: Secondary | ICD-10-CM | POA: Diagnosis not present

## 2017-12-07 ENCOUNTER — Ambulatory Visit (INDEPENDENT_AMBULATORY_CARE_PROVIDER_SITE_OTHER): Payer: Medicare Other | Admitting: Gastroenterology

## 2017-12-07 ENCOUNTER — Encounter: Payer: Self-pay | Admitting: Gastroenterology

## 2017-12-07 VITALS — BP 114/70 | HR 68 | Ht 65.0 in | Wt 330.4 lb

## 2017-12-07 DIAGNOSIS — K52839 Microscopic colitis, unspecified: Secondary | ICD-10-CM | POA: Diagnosis not present

## 2017-12-07 DIAGNOSIS — R197 Diarrhea, unspecified: Secondary | ICD-10-CM | POA: Diagnosis not present

## 2017-12-07 MED ORDER — BUDESONIDE 3 MG PO CPEP: 9.0000 mg | ORAL_CAPSULE | Freq: Every day | ORAL | 1 refills | Status: DC

## 2017-12-07 NOTE — Progress Notes (Addendum)
12/07/2017 [name] [id number] 02/06/1951   HISTORY OF PRESENT ILLNESS:  This is a 67 year old female who is a patient of Dr. Celesta Aver.  She was diagnosed with microscopic colitis in 12/2015 at the time of colonoscopy when she was reporting diarrhea.  Could not afford budesonide in the past so was treated with prednisone.  Comes in again today stating that she is having diarrhea again.  Mostly after eating, usually does not matter what she eats.  Says that she will have like 3 episodes of diarrhea at a time.  About 6 BM's per day.  No blood but gets abdominal cramping.  Have had a couple of episodes of nocturnal stools.     Past Medical History:  Diagnosis Date  . Anxiety   . Arthritis   . C. difficile colitis 02/13/2014  . CAD (coronary artery disease)    nonobstructive plaque by cath 2013  . Chronic diastolic CHF (congestive heart failure) ([place]) 08/2011   EF 55-60%, nl valves  . Colitis   . Complication of anesthesia    hard time waking up 02 sats 85-89% range   . Depression   . Fatty liver 2006   [place]  . Fibula fracture   . Fracture of metacarpal shaft of left hand, closed 09/21/2016   Dr Sabra Heck  . GERD (gastroesophageal reflux disease)   . Hyperlipidemia   . Hypersomnia with sleep apnea, unspecified 01/08/2014  . Hypertension   . Hypothyroidism   . Influenzal bronchitis 07/24/2017  . Microscopic colitis 01/09/2014  . Morbid obesity ([place])    seen dietician 09/2011  . Myelolipoma 10/2010   stable L adrenal myelolipoma  . 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 OSA (obstructive sleep apnea)    medium quattro full face mask CPAP 10cm  . Osteoarthritis of left knee 2016   nontraumatic loose body with locking, primary localized OA [name])  . Rectal polyp   . RLS (restless legs syndrome)   . T2DM (type 2 diabetes mellitus) ([place])   . Vitamin B 12 deficiency    IF negative  . Vitamin D deficiency    last check 66 (11/2010)   Past Surgical History:    Procedure Laterality Date  . CARDIAC CATHETERIZATION  10/12/09   EF 70%  . CARDIAC CATHETERIZATION  08/23/2011   Mild nonobstructive CAD, normal LV fxn  . CARDIAC CATHETERIZATION  06/2016   mild nonobstructive CAD Burt Knack)  . COLONOSCOPY WITH PROPOFOL N/A 12/21/2015   HP, focal chronic active colitis [name], MD)  . ESOPHAGOGASTRODUODENOSCOPY  2006  . KNEE SURGERY Left 11-02-2008   Partial medial and lateral Menisectomies and Chondroplasty (Dr. [name])  . LEFT HEART CATH AND CORONARY ANGIOGRAPHY N/A 06/23/2016   Procedure: Left Heart Cath and Coronary Angiography;  Surgeon: Sherren Mocha, MD;  Location: [facility];  Service: Cardiovascular;  Laterality: N/A;  . LEFT HEART CATHETERIZATION WITH CORONARY ANGIOGRAM N/A 08/23/2011   Procedure: LEFT HEART CATHETERIZATION WITH CORONARY ANGIOGRAM;  Surgeon: Sherren Mocha, MD;  Location: [facility];  Service: Cardiovascular;  Laterality: N/A;  . MR ABDOMEN  01/16/2011   Left 4cm adrenal myelolipoma  . ORIF METACARPAL FRACTURE Left 10/2016   [name]  . OSTEOTOMY AND ULNAR SHORTENING  2001   [name]  . US ECHOCARDIOGRAPHY  08/2011   EF of 55-60% and indeterminate diastolic function  . VENTRAL HERNIA REPAIR  03/01/2012   Procedure: LAPAROSCOPIC VENTRAL HERNIA;  Surgeon: Ralene Ok,  MD;  Location: [facility];  Service: General;  Laterality: N/A;    reports that she quit smoking about 20 years ago. She has never used smokeless tobacco. She reports that she does not drink alcohol or use drugs. family history includes Coronary artery disease (age of onset: 37) in her mother; Diabetes in her mother; Heart attack (age of onset: 39) in her brother; Heart attack (age of onset: 69) in her father; Stroke in her mother. Allergies  Allergen Reactions  . Augmentin [Amoxicillin-Pot Clavulanate] Diarrhea and Other (See Comments)    Watery diarrhea and abd pain Has patient had a PCN reaction causing immediate rash, facial/tongue/throat swelling, SOB  or lightheadedness with hypotension: no Has patient had a PCN reaction causing severe rash involving mucus membranes or skin necrosis: no Has patient had a PCN reaction that required hospitalization no Has patient had a PCN reaction occurring within the last 10 years: yes If all of the above answers are "NO", then may proceed with Cephal Watery diarrhea and abd pain Has patient had a PCN reaction causing immediate rash, facial/tongue/throat swelling, SOB or lightheadedness with hypotension: no Has patient had a PCN reaction causing severe rash involving mucus membranes or skin necrosis: no Has patient had a PCN reaction that required hospitalization no Has patient had a PCN reaction occurring within the last 10 years: yes If all of the above answers are "NO", then may proceed with Cephal  . Erythromycin Base Nausea And Vomiting  . Neomycin Nausea And Vomiting      Outpatient Encounter Medications as of 12/07/2017  Medication Sig  . ALPRAZolam (XANAX) 0.5 MG tablet Take 0.5 mg by mouth 3 (three) times daily as needed for anxiety. Takes 1/2 tablet in AM, then 1/2 tablet at noon, 1 tablet at bedtime as needed  . aspirin EC 81 MG tablet Take 81 mg by mouth once a week.   Marland Kitchen atorvastatin (LIPITOR) 40 MG tablet TAKE 1 TABLET BY MOUTH AT BEDTIME  . cyanocobalamin (,VITAMIN B-12,) 1000 MCG/ML injection Inject 1 mL (1,000 mcg total) into the muscle every 30 (thirty) days.  . furosemide (LASIX) 40 MG tablet Take 1.5 tablets (60 mg total) by mouth daily.  Marland Kitchen gabapentin (NEURONTIN) 400 MG capsule Take 400-1,200 mg by mouth 3 (three) times daily. Take one capsule in the morning, one capsule 7 hours later and then three capsules at bedtime.  Marland Kitchen glimepiride (AMARYL) 1 MG tablet TAKE 1 TABLET (1 MG TOTAL) BY MOUTH DAILY WITH BREAKFAST.  Marland Kitchen lamoTRIgine (LAMICTAL) 200 MG tablet Take 200 mg by mouth daily.  Marland Kitchen levothyroxine (SYNTHROID, LEVOTHROID) 112 MCG tablet TAKE 2 TABLETS BY MOUTH EVERY DAY BEOFRE BREAKFAST    . metFORMIN (GLUCOPHAGE) 850 MG tablet Take 1 tablet (850 mg total) by mouth 2 (two) times daily with a meal.  . nystatin ointment (MYCOSTATIN) Apply 1 application daily as needed topically. Apply to red areas in groin  . omeprazole (PRILOSEC) 40 MG capsule TAKE ONE CAPSULE BY MOUTH EVERY DAY  . potassium chloride (K-DUR) 10 MEQ tablet Take 2 tablets (20 mEq total) by mouth daily.  . ramipril (ALTACE) 10 MG capsule TAKE ONE CAPSULE BY MOUTH EVERY DAY  . rOPINIRole (REQUIP) 3 MG tablet TAKE 1 TABLET (3 MG TOTAL) BY MOUTH AT BEDTIME.  Marland Kitchen triamcinolone cream (KENALOG) 0.1 % APPLY TO AFFECTED AREA ON EXTERNAL EAR TWICE DAILY. DO NOT USE FOR MORE THAN 2 WEEKS AT A TIME  . Vilazodone HCl (VIIBRYD) 40 MG TABS Take 40 mg by mouth  every morning.  . nitroGLYCERIN (NITROSTAT) 0.4 MG SL tablet Place 1 tablet (0.4 mg total) under the tongue every 5 (five) minutes as needed for up to 25 days for chest pain. (Patient not taking: Reported on 12/07/2017)   No facility-administered encounter medications on file as of 12/07/2017.      REVIEW OF SYSTEMS  : All other systems reviewed and negative except where noted in the History of Present Illness.   PHYSICAL EXAM: BP 114/70 (BP Location: Right Wrist, Patient Position: Sitting, Cuff Size: Normal)   Pulse 68   Ht 5\' 5"  (1.651 m)   Wt (!) 330 lb 6 oz (149.9 kg)   BMI 54.98 kg/m  General: Well developed white female in no acute distress Head: Normocephalic and atraumatic Eyes:  Sclerae anicteric, conjunctive pink. Ears: Normal auditory acuity Lungs: Clear throughout to auscultation; no increased WOB. Heart: Regular rate and rhythm; no M/R/G. Abdomen: Soft, non-distended.  BS present.  Mild diffuse TTP. Musculoskeletal: Symmetrical with no gross deformities  Skin: No lesions on visible extremities Extremities: No edema  Neurological: Alert oriented x 4, grossly non-focal Psychological:  Alert and cooperative. Normal mood and affect  ASSESSMENT AND  PLAN: *Microscopic colitis:  Diagnosed on colonoscopy in 2017.  Now with diarrhea again, mostly post-prandial.  Will try to get generic budesonide and have her take 9 mg daily.  Will have her follow-up here in about 6 weeks with Dr. Carlean Purl or myself.  ? If her medications (especially metformin) is playing into her diarrhea as well.   CC:  Ria Bush, MD  Agree with Ms. Mihran Lebarron's management.  [name], MD, [facility]

## 2017-12-07 NOTE — Patient Instructions (Signed)
We have sent the following medications to your pharmacy for you to pick up at your convenience: Budesonide 3 mg three tabs daily

## 2017-12-16 ENCOUNTER — Encounter: Payer: Self-pay | Admitting: Internal Medicine

## 2017-12-17 ENCOUNTER — Other Ambulatory Visit: Payer: Self-pay | Admitting: Gastroenterology

## 2017-12-17 ENCOUNTER — Other Ambulatory Visit: Payer: Self-pay | Admitting: Internal Medicine

## 2017-12-17 MED ORDER — PREDNISONE 10 MG PO TABS: 40.0000 mg | ORAL_TABLET | Freq: Every day | ORAL | 0 refills | Status: DC

## 2017-12-19 ENCOUNTER — Ambulatory Visit: Payer: Self-pay | Admitting: Family Medicine

## 2017-12-19 NOTE — Telephone Encounter (Signed)
I spoke with pt and she already scheduled appt with R Baity NP on 12/20/17 at 11:15; pt could not schedule appt today due to work. FYI to Avie Echevaria NP.

## 2017-12-19 NOTE — Telephone Encounter (Signed)
Did we give her ER precautions?

## 2017-12-19 NOTE — Telephone Encounter (Signed)
I skyped Pharmacologist at [facility] and she said no ED precautions given due to [name] wanting to end call quickly; [name] said [name] did not sound in any distress. I spoke with [name] and ED precautions given and [name] said she feels OK now just wants to get rid of cough. If [name] has any difficulty breathing, SOB, excessive wheezing or high fever [name] will go to ED prior to appt. [name] appreciated call.

## 2017-12-19 NOTE — Telephone Encounter (Signed)
She was transferred to me (nurse) by the agent because she mentioned she was "wheezing".    She mentioned, "I just needed to make an appt" after I triaged her.   While trying to see if there were any appts available with [facility] she said,  "I really have to go".   "I've been on the phone for 10 minutes".    "I just needed an appt".     I let her know why she was referred to the nurse because she mentioned she was wheezing.   I let her know when she calls back to schedule an appt not to mention the wheezing or she will be routed back to a nurse so just let the agent know you need an appt for the "bronchitis" you say you get a few times a year and are having the same symptoms.   I also let her know to tell the agent she has already talked with a nurse so she won't be transferred back to a nurse when she calls back.  I apologized to her for it taking longer than expected to get an appt and to please call back at her convenience.   She agreed to this plan.    I routed a note to Dr. Danise Mina to make them aware. Reason for Disposition . [1] MILD difficulty breathing (e.g., minimal/no SOB at rest, SOB with walking, pulse <100) AND [2] NEW-onset or WORSE than normal  Answer Assessment - Initial Assessment Questions 1. RESPIRATORY STATUS: "Describe your breathing?" (e.g., wheezing, shortness of breath, unable to speak, severe coughing)      I hear myself wheezing when I take a deep breath.    2. ONSET: "When did this breathing problem begin?"      A week ago Friday.     I'm taking Mucinex.     3. PATTERN "Does the difficult breathing come and go, or has it been constant since it started?"      It's been constant the wheezing. 4. SEVERITY: "How bad is your breathing?" (e.g., mild, moderate, severe)    - MILD: No SOB at rest, mild SOB with walking, speaks normally in sentences, can lay down, no retractions, pulse < 100.    - MODERATE: SOB at rest, SOB with minimal exertion and prefers to sit,  cannot lie down flat, speaks in phrases, mild retractions, audible wheezing, pulse 100-120.    - SEVERE: Very SOB at rest, speaks in single words, struggling to breathe, sitting hunched forward, retractions, pulse > 120      If I'm active I have a little shortness of breath but not bad. 5. RECURRENT SYMPTOM: "Have you had difficulty breathing before?" If so, ask: "When was the last time?" and "What happened that time?"      Yes.  Many times.   I've had bronchitis and pneumonia before. 6. CARDIAC HISTORY: "Do you have any history of heart disease?" (e.g., heart attack, angina, bypass surgery, angioplasty)      I have CHF.   7. LUNG HISTORY: "Do you have any history of lung disease?"  (e.g., pulmonary embolus, asthma, emphysema)     No lung history 8. CAUSE: "What do you think is causing the breathing problem?"      I think I have bronchitis. 9. OTHER SYMPTOMS: "Do you have any other symptoms? (e.g., dizziness, runny nose, cough, chest pain, fever)     Sore throat.   Coughing.    Non productive cough. 10. PREGNANCY: "  Is there any chance you are pregnant?" "When was your last menstrual period?"       Not asked 11. TRAVEL: "Have you traveled out of the country in the last month?" (e.g., travel history, exposures)       No exposure.  Protocols used: BREATHING DIFFICULTY-A-AH

## 2017-12-20 ENCOUNTER — Ambulatory Visit: Payer: Medicare Other | Admitting: Internal Medicine

## 2017-12-25 DIAGNOSIS — F341 Dysthymic disorder: Secondary | ICD-10-CM | POA: Diagnosis not present

## 2017-12-27 ENCOUNTER — Ambulatory Visit (INDEPENDENT_AMBULATORY_CARE_PROVIDER_SITE_OTHER): Payer: Medicare Other | Admitting: Family Medicine

## 2017-12-27 ENCOUNTER — Encounter: Payer: Self-pay | Admitting: Family Medicine

## 2017-12-27 VITALS — BP 120/78 | HR 76 | Temp 98.2°F | Ht 65.0 in | Wt 328.5 lb

## 2017-12-27 DIAGNOSIS — E538 Deficiency of other specified B group vitamins: Secondary | ICD-10-CM | POA: Diagnosis not present

## 2017-12-27 DIAGNOSIS — F331 Major depressive disorder, recurrent, moderate: Secondary | ICD-10-CM

## 2017-12-27 DIAGNOSIS — F419 Anxiety disorder, unspecified: Secondary | ICD-10-CM | POA: Diagnosis not present

## 2017-12-27 DIAGNOSIS — J209 Acute bronchitis, unspecified: Secondary | ICD-10-CM | POA: Diagnosis not present

## 2017-12-27 MED ORDER — CYANOCOBALAMIN 1000 MCG/ML IJ SOLN
1000.0000 ug | Freq: Once | INTRAMUSCULAR | Status: AC
  Administered 2017-12-27: 1000 ug via INTRAMUSCULAR

## 2017-12-27 NOTE — Assessment & Plan Note (Signed)
Anticipate acute bronchitis slow to recover. No signs of bacterial infection today. Further supportive care recommended - fluids, rest, mucinex scheduled with albuterol and ibuprofen 400-600mg  with meals. Update if signs of bacterial infection develop.

## 2017-12-27 NOTE — Progress Notes (Signed)
BP 120/78 (BP Location: Left Arm, Patient Position: Sitting, Cuff Size: Large)   Pulse 76   Temp 98.2 F (36.8 C) (Oral)   Ht 5\' 5"  (1.651 m)   Wt (!) 328 lb 8 oz (149 kg)   SpO2 94%   BMI 54.67 kg/m    CC: cough Subjective:    Patient ID: [name], female    DOB: 24-Aug-1950, 67 y.o.   MRN: [id number]  HPI: FATISHA RABALAIS is a 67 y.o. female presenting on 12/27/2017 for 3 mo follow up (Wants to dicuss Xanax, gabapentin, Viibryd and Lamictal.) and Cough (C/o non-productive cough and chest congestion. States she can't quite cough up mucous. Tried Mucinex. )   3 wk h/o rattling cough, difficulty expectorating despite mucinex ER. Mild improvement, but persistent symptoms. Some wheezing with deep breaths, chest tightness with coughing, some ear discomfort. Initial PNdrainage and ST has improved. Persistent chest congestion. Slow improvement noted each week.   Treating with mucinex and albuterol inhaler (twice a day).   No fevers/chills, tooth pain.  Sleeping ok.  No sick contacts at home.  OSA on CPAP - recently saw sleep doctor with good report.  Lab Results  Component Value Date   HGBA1C 7.8 09/25/2017    Changing psychiatrists in [place] - requests local referral.   Relevant past medical, surgical, family and social history reviewed and updated as indicated. Interim medical history since our last visit reviewed. Allergies and medications reviewed and updated. Outpatient Medications Prior to Visit  Medication Sig Dispense Refill  . ALPRAZolam (XANAX) 0.5 MG tablet Take 0.5 mg by mouth 3 (three) times daily as needed for anxiety. Takes 1/2 tablet in AM, then 1/2 tablet at noon, 1 tablet at bedtime as needed  2  . aspirin EC 81 MG tablet Take 81 mg by mouth once a week.     Marland Kitchen atorvastatin (LIPITOR) 40 MG tablet TAKE 1 TABLET BY MOUTH AT BEDTIME 90 tablet 1  . cyanocobalamin (,VITAMIN B-12,) 1000 MCG/ML injection Inject 1 mL (1,000 mcg total) into the muscle every 30  (thirty) days. 10 mL 3  . furosemide (LASIX) 40 MG tablet Take 1.5 tablets (60 mg total) by mouth daily. 135 tablet 1  . gabapentin (NEURONTIN) 400 MG capsule Take 400-1,200 mg by mouth 3 (three) times daily. Take one capsule in the morning, one capsule 7 hours later and then three capsules at bedtime.  0  . glimepiride (AMARYL) 1 MG tablet TAKE 1 TABLET (1 MG TOTAL) BY MOUTH DAILY WITH BREAKFAST. 30 tablet 5  . lamoTRIgine (LAMICTAL) 200 MG tablet Take 200 mg by mouth daily.    Marland Kitchen levothyroxine (SYNTHROID, LEVOTHROID) 112 MCG tablet TAKE 2 TABLETS BY MOUTH EVERY DAY BEOFRE BREAKFAST 60 tablet 5  . metFORMIN (GLUCOPHAGE) 850 MG tablet Take 1 tablet (850 mg total) by mouth 2 (two) times daily with a meal. 60 tablet 11  . nitroGLYCERIN (NITROSTAT) 0.4 MG SL tablet Place 1 tablet (0.4 mg total) under the tongue every 5 (five) minutes as needed for up to 25 days for chest pain. (Patient not taking: Reported on 12/07/2017) 25 tablet 5  . nystatin ointment (MYCOSTATIN) Apply 1 application daily as needed topically. Apply to red areas in groin 30 g 1  . omeprazole (PRILOSEC) 40 MG capsule TAKE ONE CAPSULE BY MOUTH EVERY DAY 30 capsule 8  . potassium chloride (K-DUR) 10 MEQ tablet Take 2 tablets (20 mEq total) by mouth daily. 180 tablet 1  . predniSONE (DELTASONE) 10  MG tablet Take 4 tablets (40 mg total) by mouth daily with breakfast. Dr. Carlean Purl will advise taper later 100 tablet 0  . ramipril (ALTACE) 10 MG capsule TAKE ONE CAPSULE BY MOUTH EVERY DAY 90 capsule 2  . rOPINIRole (REQUIP) 3 MG tablet TAKE 1 TABLET (3 MG TOTAL) BY MOUTH AT BEDTIME. 90 tablet 1  . triamcinolone cream (KENALOG) 0.1 % APPLY TO AFFECTED AREA ON EXTERNAL EAR TWICE DAILY. DO NOT USE FOR MORE THAN 2 WEEKS AT A TIME 30 g 0  . Vilazodone HCl (VIIBRYD) 40 MG TABS Take 40 mg by mouth every morning.     No facility-administered medications prior to visit.      Per HPI unless specifically indicated in ROS section below Review of  Systems     Objective:    BP 120/78 (BP Location: Left Arm, Patient Position: Sitting, Cuff Size: Large)   Pulse 76   Temp 98.2 F (36.8 C) (Oral)   Ht 5\' 5"  (1.651 m)   Wt (!) 328 lb 8 oz (149 kg)   SpO2 94%   BMI 54.67 kg/m   Wt Readings from Last 3 Encounters:  12/27/17 (!) 328 lb 8 oz (149 kg)  12/07/17 (!) 330 lb 6 oz (149.9 kg)  11/27/17 (!) 328 lb (148.8 kg)    Physical Exam  Constitutional: She appears well-developed and well-nourished. No distress.  HENT:  Head: Normocephalic and atraumatic.  Right Ear: Hearing, tympanic membrane, external ear and ear canal normal.  Left Ear: Hearing, tympanic membrane, external ear and ear canal normal.  Nose: Mucosal edema present. No rhinorrhea. Right sinus exhibits no maxillary sinus tenderness and no frontal sinus tenderness. Left sinus exhibits no maxillary sinus tenderness and no frontal sinus tenderness.  Mouth/Throat: Uvula is midline, oropharynx is clear and moist and mucous membranes are normal. No oropharyngeal exudate, posterior oropharyngeal edema, posterior oropharyngeal erythema or tonsillar abscesses.  Congestion behind TMs L nasal septal deviation  Eyes: Pupils are equal, round, and reactive to light. Conjunctivae and EOM are normal. No scleral icterus.  Neck: Normal range of motion. Neck supple.  Cardiovascular: Normal rate, regular rhythm, normal heart sounds and intact distal pulses.  No murmur heard. Pulmonary/Chest: Effort normal and breath sounds normal. No respiratory distress. She has no wheezes. She has no rales.  Somewhat coarse, wheezing seems to be upper airway transmission  Lymphadenopathy:    She has no cervical adenopathy.  Skin: Skin is warm and dry. No rash noted.  Nursing note and vitals reviewed.  Results for orders placed or performed in visit on 10/25/17  HM DIABETES EYE EXAM  Result Value Ref Range   HM Diabetic Eye Exam  No Retinopathy      Assessment & Plan:  Patient will return for  wellness visit.  Problem List Items Addressed This Visit    Vitamin B12 deficiency    B12 shot today      MDD (major depressive disorder), recurrent episode, moderate (HCC)    With anxiety - chronic issue has been followed by psych on lamictal, vybriid, xanax, and gabapentin. Requests referral to local psychiatrist - placed.       Relevant Orders   Ambulatory referral to Psychiatry   Anxiety   Acute bronchitis - Primary    Anticipate acute bronchitis slow to recover. No signs of bacterial infection today. Further supportive care recommended - fluids, rest, mucinex scheduled with albuterol and ibuprofen 400-600mg  with meals. Update if signs of bacterial infection develop.  No orders of the defined types were placed in this encounter.  Orders Placed This Encounter  Procedures  . Ambulatory referral to Psychiatry    Referral Priority:   Routine    Referral Type:   Psychiatric    Referral Reason:   Specialty Services Required    Requested Specialty:   Psychiatry    Number of Visits Requested:   1    Follow up plan: No follow-ups on file.  Ria Bush, MD

## 2017-12-27 NOTE — Assessment & Plan Note (Addendum)
B12 shot today. 

## 2017-12-27 NOTE — Assessment & Plan Note (Signed)
With anxiety - chronic issue has been followed by psych on lamictal, vybriid, xanax, and gabapentin. Requests referral to local psychiatrist - placed.

## 2017-12-27 NOTE — Addendum Note (Signed)
Addended by: Brenton Grills on: 9/61/1643 53:91 AM   Modules accepted: Orders

## 2017-12-27 NOTE — Patient Instructions (Addendum)
Schedule wellness visit after 01/17/2018.  For bronchitis - I think this is slowly improving. Schedule albuterol with mucinex twice daily. Take ibuprofen 400-600mg  with meals for next few days to help inflammation in the lungs. Let us know if fever >101 or worsening productive cough for antibiotic course.  See Rosaria Ferries for local referral to psychiatry.

## 2018-01-01 DIAGNOSIS — F341 Dysthymic disorder: Secondary | ICD-10-CM | POA: Diagnosis not present

## 2018-01-08 DIAGNOSIS — F341 Dysthymic disorder: Secondary | ICD-10-CM | POA: Diagnosis not present

## 2018-01-21 ENCOUNTER — Ambulatory Visit: Payer: Medicare Other | Admitting: Internal Medicine

## 2018-01-22 DIAGNOSIS — F341 Dysthymic disorder: Secondary | ICD-10-CM | POA: Diagnosis not present

## 2018-01-25 ENCOUNTER — Ambulatory Visit
Admission: RE | Admit: 2018-01-25 | Discharge: 2018-01-25 | Disposition: A | Discharge status: Home / Self Care | Payer: Medicare Other | Source: Ambulatory Visit | Attending: Family Medicine | Admitting: Family Medicine

## 2018-01-25 DIAGNOSIS — Z1231 Encounter for screening mammogram for malignant neoplasm of breast: Secondary | ICD-10-CM | POA: Insufficient documentation

## 2018-01-28 ENCOUNTER — Encounter: Payer: Self-pay | Admitting: *Deleted

## 2018-02-05 DIAGNOSIS — F341 Dysthymic disorder: Secondary | ICD-10-CM | POA: Diagnosis not present

## 2018-02-06 DIAGNOSIS — F411 Generalized anxiety disorder: Secondary | ICD-10-CM | POA: Diagnosis not present

## 2018-02-06 DIAGNOSIS — F3181 Bipolar II disorder: Secondary | ICD-10-CM | POA: Diagnosis not present

## 2018-02-10 ENCOUNTER — Encounter: Payer: Self-pay | Admitting: Family Medicine

## 2018-02-10 ENCOUNTER — Other Ambulatory Visit: Payer: Self-pay | Admitting: Family Medicine

## 2018-02-11 ENCOUNTER — Telehealth: Payer: Self-pay

## 2018-02-11 MED ORDER — ROPINIROLE HCL 3 MG PO TABS: 3.0000 mg | ORAL_TABLET | Freq: Every day | ORAL | 0 refills | Status: DC

## 2018-02-11 MED ORDER — OMEPRAZOLE 40 MG PO CPDR: 40.0000 mg | DELAYED_RELEASE_CAPSULE | Freq: Every day | ORAL | 0 refills | Status: DC

## 2018-02-11 NOTE — Telephone Encounter (Signed)
Sent refills for omeprazole and atorvastatin.  Notified pt.

## 2018-02-11 NOTE — Telephone Encounter (Signed)
Received MyChart message requesting refill for omeprazole and Requip. Next OV is CPE on 02/25/18. [See Pt Message, 02/10/18.]  E-scribed refills. Notified pt via [place].

## 2018-02-11 NOTE — Telephone Encounter (Signed)
E-scribed refill for ropinirol. Notified pt.

## 2018-02-20 ENCOUNTER — Ambulatory Visit: Payer: Medicare Other

## 2018-02-25 ENCOUNTER — Encounter: Payer: Medicare Other | Admitting: Family Medicine

## 2018-03-04 DIAGNOSIS — F341 Dysthymic disorder: Secondary | ICD-10-CM | POA: Diagnosis not present

## 2018-03-06 ENCOUNTER — Ambulatory Visit: Payer: Medicare Other | Admitting: Family Medicine

## 2018-03-08 ENCOUNTER — Ambulatory Visit: Payer: Medicare Other | Admitting: Internal Medicine

## 2018-03-09 ENCOUNTER — Other Ambulatory Visit: Payer: Self-pay | Admitting: Family Medicine

## 2018-03-15 ENCOUNTER — Telehealth: Payer: Self-pay | Admitting: *Deleted

## 2018-03-15 MED ORDER — ROPINIROLE HCL 3 MG PO TABS: 3.0000 mg | ORAL_TABLET | Freq: Every day | ORAL | 0 refills | Status: DC

## 2018-03-15 NOTE — Telephone Encounter (Signed)
Pt states she is currently out of town and is staying more days that she originally planned and requested #[facility] tabs sent to [facility]. Pt advised she may have to pay out of pocket, as she received #90 01/2018

## 2018-03-19 DIAGNOSIS — Z23 Encounter for immunization: Secondary | ICD-10-CM | POA: Diagnosis not present

## 2018-03-19 DIAGNOSIS — F341 Dysthymic disorder: Secondary | ICD-10-CM | POA: Diagnosis not present

## 2018-03-20 DIAGNOSIS — M1711 Unilateral primary osteoarthritis, right knee: Secondary | ICD-10-CM | POA: Diagnosis not present

## 2018-03-27 ENCOUNTER — Other Ambulatory Visit: Payer: Self-pay | Admitting: Family Medicine

## 2018-03-27 DIAGNOSIS — E1165 Type 2 diabetes mellitus with hyperglycemia: Secondary | ICD-10-CM

## 2018-03-27 DIAGNOSIS — IMO0002 Reserved for concepts with insufficient information to code with codable children: Secondary | ICD-10-CM

## 2018-03-27 DIAGNOSIS — E039 Hypothyroidism, unspecified: Secondary | ICD-10-CM

## 2018-03-27 DIAGNOSIS — E538 Deficiency of other specified B group vitamins: Secondary | ICD-10-CM

## 2018-03-27 DIAGNOSIS — E559 Vitamin D deficiency, unspecified: Secondary | ICD-10-CM

## 2018-03-27 DIAGNOSIS — E782 Mixed hyperlipidemia: Secondary | ICD-10-CM

## 2018-03-27 DIAGNOSIS — K52839 Microscopic colitis, unspecified: Secondary | ICD-10-CM

## 2018-03-27 DIAGNOSIS — E1140 Type 2 diabetes mellitus with diabetic neuropathy, unspecified: Secondary | ICD-10-CM

## 2018-03-28 ENCOUNTER — Telehealth: Payer: Self-pay

## 2018-03-28 ENCOUNTER — Ambulatory Visit: Payer: Medicare Other

## 2018-03-28 NOTE — Telephone Encounter (Signed)
Patient missed appt for AWV and labs. Attempted to reach patient to reschedule appointment. Left message on primary phone.

## 2018-04-01 ENCOUNTER — Encounter: Payer: Self-pay | Admitting: Family Medicine

## 2018-04-02 ENCOUNTER — Other Ambulatory Visit: Payer: Self-pay | Admitting: Family Medicine

## 2018-04-02 DIAGNOSIS — S60222A Contusion of left hand, initial encounter: Secondary | ICD-10-CM | POA: Diagnosis not present

## 2018-04-02 NOTE — Telephone Encounter (Signed)
See pt email. Thanks!!

## 2018-04-04 ENCOUNTER — Encounter: Payer: Medicare Other | Admitting: Family Medicine

## 2018-04-04 ENCOUNTER — Telehealth: Payer: Self-pay

## 2018-04-04 MED ORDER — LEVOTHYROXINE SODIUM 112 MCG PO TABS: ORAL_TABLET | ORAL | 1 refills | Status: DC

## 2018-04-04 MED ORDER — RAMIPRIL 10 MG PO CAPS: 10.0000 mg | ORAL_CAPSULE | Freq: Every day | ORAL | 1 refills | Status: AC

## 2018-04-04 MED ORDER — ROPINIROLE HCL 3 MG PO TABS: 3.0000 mg | ORAL_TABLET | Freq: Every day | ORAL | 1 refills | Status: DC

## 2018-04-04 MED ORDER — POTASSIUM CHLORIDE ER 10 MEQ PO TBCR: 20.0000 meq | EXTENDED_RELEASE_TABLET | Freq: Every day | ORAL | 1 refills | Status: AC

## 2018-04-04 MED ORDER — METFORMIN HCL 850 MG PO TABS: 850.0000 mg | ORAL_TABLET | Freq: Two times a day (BID) | ORAL | 4 refills | Status: AC

## 2018-04-04 MED ORDER — ATORVASTATIN CALCIUM 40 MG PO TABS: 40.0000 mg | ORAL_TABLET | Freq: Every day | ORAL | 1 refills | Status: DC

## 2018-04-04 MED ORDER — FUROSEMIDE 40 MG PO TABS: 60.0000 mg | ORAL_TABLET | Freq: Every day | ORAL | 1 refills | Status: AC

## 2018-04-04 MED ORDER — GLIMEPIRIDE 1 MG PO TABS: 1.0000 mg | ORAL_TABLET | Freq: Every day | ORAL | 1 refills | Status: DC

## 2018-04-04 MED ORDER — OMEPRAZOLE 40 MG PO CPDR: 40.0000 mg | DELAYED_RELEASE_CAPSULE | Freq: Every day | ORAL | 4 refills | Status: AC

## 2018-04-04 NOTE — Telephone Encounter (Signed)
E-scribed refills to new pharmacy. [See TE, 04/04/18.]

## 2018-04-04 NOTE — Telephone Encounter (Signed)
requip refilled ? ?

## 2018-04-04 NOTE — Telephone Encounter (Signed)
E-scribed refills to [facility] [place] per pt requests. [See Pt Msg, 04/01/18.]  Did not refill gabapentin, Lamictal, Vybriid and Xanax- looks like psych handles those.    Did you want to refill Requip?

## 2018-04-15 DIAGNOSIS — F319 Bipolar disorder, unspecified: Secondary | ICD-10-CM | POA: Diagnosis not present

## 2018-04-24 DIAGNOSIS — F3181 Bipolar II disorder: Secondary | ICD-10-CM | POA: Diagnosis not present

## 2018-04-24 DIAGNOSIS — G2581 Restless legs syndrome: Secondary | ICD-10-CM | POA: Diagnosis not present

## 2018-04-24 DIAGNOSIS — F419 Anxiety disorder, unspecified: Secondary | ICD-10-CM | POA: Diagnosis not present

## 2018-04-29 DIAGNOSIS — F319 Bipolar disorder, unspecified: Secondary | ICD-10-CM | POA: Diagnosis not present

## 2018-05-17 ENCOUNTER — Encounter: Payer: Self-pay | Admitting: Family Medicine

## 2018-05-17 NOTE — Telephone Encounter (Signed)
Refills for atorvastatin and ropinirole were sent [facility] in 04/04/18, enough to take pt through 08/2018. However, it looks the last rx for gabapentin was 09/01/2015.

## 2018-05-18 ENCOUNTER — Other Ambulatory Visit: Payer: Self-pay | Admitting: Family Medicine

## 2018-05-20 ENCOUNTER — Other Ambulatory Visit: Payer: Self-pay | Admitting: Family Medicine

## 2018-05-21 MED ORDER — ROPINIROLE HCL 3 MG PO TABS: 3.0000 mg | ORAL_TABLET | Freq: Every day | ORAL | 0 refills | Status: AC

## 2018-05-21 MED ORDER — GABAPENTIN 400 MG PO CAPS: 400.0000 mg | ORAL_CAPSULE | Freq: Three times a day (TID) | ORAL | 0 refills | Status: AC

## 2018-05-21 MED ORDER — ATORVASTATIN CALCIUM 40 MG PO TABS: 40.0000 mg | ORAL_TABLET | Freq: Every day | ORAL | 0 refills | Status: AC

## 2018-05-23 DIAGNOSIS — F418 Other specified anxiety disorders: Secondary | ICD-10-CM | POA: Diagnosis not present

## 2018-06-02 ENCOUNTER — Other Ambulatory Visit: Payer: Self-pay | Admitting: Family Medicine

## 2018-06-04 ENCOUNTER — Other Ambulatory Visit: Payer: Self-pay | Admitting: Family Medicine

## 2018-06-13 DIAGNOSIS — F418 Other specified anxiety disorders: Secondary | ICD-10-CM | POA: Diagnosis not present

## 2018-06-14 ENCOUNTER — Ambulatory Visit (INDEPENDENT_AMBULATORY_CARE_PROVIDER_SITE_OTHER): Payer: Medicare Other | Admitting: Nurse Practitioner

## 2018-06-14 ENCOUNTER — Encounter: Payer: Self-pay | Admitting: Nurse Practitioner

## 2018-06-14 VITALS — BP 130/80 | HR 66 | Temp 98.4°F | Ht 65.0 in | Wt 319.0 lb

## 2018-06-14 DIAGNOSIS — R21 Rash and other nonspecific skin eruption: Secondary | ICD-10-CM

## 2018-06-14 MED ORDER — FLUOCINOLONE ACETONIDE 0.025 % EX OINT: TOPICAL_OINTMENT | Freq: Two times a day (BID) | CUTANEOUS | 0 refills | Status: AC

## 2018-06-14 NOTE — Patient Instructions (Addendum)
Apply fluocinolone (SYNALAR) twice daily.  Please follow up for new symptoms, if your symptoms get worse, or if your symptoms dont get better in 1 week.   Contact Dermatitis Dermatitis is redness, soreness, and swelling (inflammation) of the skin. Contact dermatitis is a reaction to something that touches the skin. There are two types of contact dermatitis:  Irritant contact dermatitis. This happens when something bothers (irritates) your skin, like soap.  Allergic contact dermatitis. This is caused when you are exposed to something that you are allergic to, such as poison ivy. What are the causes?  Common causes of irritant contact dermatitis include: ? Makeup. ? Soaps. ? Detergents. ? Bleaches. ? Acids. ? Metals, such as nickel.  Common causes of allergic contact dermatitis include: ? Plants. ? Chemicals. ? Jewelry. ? Latex. ? Medicines. ? Preservatives in products, such as clothing. What increases the risk?  Having a job that exposes you to things that bother your skin.  Having asthma or eczema. What are the signs or symptoms? Symptoms may happen anywhere the irritant has touched your skin. Symptoms include:  Dry or flaky skin.  Redness.  Cracks.  Itching.  Pain or a burning feeling.  Blisters.  Blood or clear fluid draining from skin cracks. With allergic contact dermatitis, swelling may occur. This may happen in places such as the eyelids, mouth, or genitals. How is this treated?  This condition is treated by checking for the cause of the reaction and protecting your skin. Treatment may also include: ? Steroid creams, ointments, or medicines. ? Antibiotic medicines or other ointments, if you have a skin infection. ? Lotion or medicines to help with itching. ? A bandage (dressing). Follow these instructions at home: Skin care  Moisturize your skin as needed.  Put cool cloths on your skin.  Put a baking soda paste on your skin. Stir water into  baking soda until it looks like a paste.  Do not scratch your skin.  Avoid having things rub up against your skin.  Avoid the use of soaps, perfumes, and dyes. Medicines  Take or apply over-the-counter and prescription medicines only as told by your doctor.  If you were prescribed an antibiotic medicine, take or apply it as told by your doctor. Do not stop using it even if your condition starts to get better. Bathing  Take a bath with: ? Epsom salts. ? Baking soda. ? Colloidal oatmeal.  Bathe less often.  Bathe in warm water. Avoid using hot water. Bandage care  If you were given a bandage, change it as told by your health care provider.  Wash your hands with soap and water before and after you change your bandage. If soap and water are not available, use hand sanitizer. General instructions  Avoid the things that caused your reaction. If you do not know what caused it, keep a journal. Write down: ? What you eat. ? What skin products you use. ? What you drink. ? What you wear in the area that has symptoms. This includes jewelry.  Check the affected areas every day for signs of infection. Check for: ? More redness, swelling, or pain. ? More fluid or blood. ? Warmth. ? Pus or a bad smell.  Keep all follow-up visits as told by your doctor. This is important. Contact a doctor if:  You do not get better with treatment.  Your condition gets worse.  You have signs of infection, such as: ? More swelling. ? Tenderness. ? More redness. ? Soreness. ?  Warmth.  You have a fever.  You have new symptoms. Get help right away if:  You have a very bad headache.  You have neck pain.  Your neck is stiff.  You throw up (vomit).  You feel very sleepy.  You see red streaks coming from the area.  Your bone or joint near the area hurts after the skin has healed.  The area turns darker.  You have trouble breathing. Summary  Dermatitis is redness, soreness, and  swelling of the skin.  Symptoms may occur where the irritant has touched you.  Treatment may include medicines and skin care.  If you do not know what caused your reaction, keep a journal.  Contact a doctor if your condition gets worse or you have signs of infection. This information is not intended to replace advice given to you by your health care provider. Make sure you discuss any questions you have with your health care provider. Document Released: 02/26/2009 Document Revised: 11/14/2017 Document Reviewed: 11/14/2017 Elsevier Interactive Patient Education  2019 [employer].

## 2018-06-14 NOTE — Progress Notes (Signed)
[name] is a 68 y.o. female with the following history as recorded in EpicCare:  Patient Active Problem List   Diagnosis Date Noted  . Cerumen impaction 07/24/2017  . Acute bronchitis 06/15/2017  . Rectal polyp   . Hypertension   . GERD (gastroesophageal reflux disease)   . Fibula fracture   . MDD (major depressive disorder), recurrent episode, moderate ([place])   . Complication of anesthesia   . Arthritis   . Anxiety   . Fracture of metacarpal shaft of left hand, closed 09/21/2016  . Diabetic peripheral vascular disease ([place]) 09/13/2016  . Abnormal nuclear cardiac imaging test 06/23/2016  . Intertrigo 07/02/2015  . Medicare annual wellness visit, subsequent 10/02/2014  . Advanced care planning/counseling discussion 10/02/2014  . Encounter for routine adult medical exam with abnormal findings 10/02/2014  . Pain in lower back 07/10/2014  . Osteoarthritis of left knee 05/15/2014  . Imbalance 05/14/2014  . C. difficile colitis 02/13/2014  . Microscopic colitis 01/09/2014  . Hypersomnia with sleep apnea, unspecified 01/08/2014  . Optic neuritis, unspecified 11/06/2012  . RLS (restless legs syndrome)   . Chronic diastolic heart failure ([place]) 09/04/2011  . Coronary artery disease 08/24/2011  . Atypical chest pain 08/20/2011  . Myelolipoma 10/14/2010  . INSOMNIA 07/26/2010  . Diarrhea 01/13/2010  . OSA on CPAP 11/08/2009  . Vitamin D deficiency 07/15/2009  . Hyperlipidemia 06/17/2009  . Type 2 diabetes mellitus, uncontrolled, with neuropathy ([place]) 05/21/2009  . Morbid obesity with BMI of 50.0-59.9, adult ([place]) 05/21/2009  . Hypothyroidism 11/12/2006  . Vitamin B12 deficiency 11/12/2006  . Adjustment disorder with depressed mood 11/12/2006  . Fatty liver 05/15/2004    Current Outpatient Medications  Medication Sig Dispense Refill  . ALPRAZolam (XANAX) 0.5 MG tablet Take 0.5 mg by mouth 3 (three) times daily as needed for anxiety. Takes 1/2 tablet in AM, then 1/2 tablet  at noon, 1 tablet at bedtime as needed  2  . aspirin EC 81 MG tablet Take 81 mg by mouth once a week.     Marland Kitchen atorvastatin (LIPITOR) 40 MG tablet Take 1 tablet (40 mg total) by mouth at bedtime. 90 tablet 0  . cyanocobalamin (,VITAMIN B-12,) 1000 MCG/ML injection Inject 1 mL (1,000 mcg total) into the muscle every 30 (thirty) days. 10 mL 3  . furosemide (LASIX) 40 MG tablet Take 1.5 tablets (60 mg total) by mouth daily. 135 tablet 1  . gabapentin (NEURONTIN) 400 MG capsule Take 1-3 capsules (400-1,200 mg total) by mouth 3 (three) times daily. Take one capsule in the morning, one capsule 7 hours later and then three capsules at bedtime. 450 capsule 0  . glimepiride (AMARYL) 1 MG tablet Take 1 tablet (1 mg total) by mouth daily with breakfast. 90 tablet 1  . lamoTRIgine (LAMICTAL) 200 MG tablet Take 200 mg by mouth daily.    Marland Kitchen levothyroxine (SYNTHROID, LEVOTHROID) 112 MCG tablet TAKE 2 TABLETS BY MOUTH EVERY DAY BEOFRE BREAKFAST 180 tablet 1  . metFORMIN (GLUCOPHAGE) 850 MG tablet Take 1 tablet (850 mg total) by mouth 2 (two) times daily with a meal. 60 tablet 4  . nystatin ointment (MYCOSTATIN) Apply 1 application daily as needed topically. Apply to red areas in groin 30 g 1  . omeprazole (PRILOSEC) 40 MG capsule Take 1 capsule (40 mg total) by mouth daily. 30 capsule 4  . potassium chloride (K-DUR) 10 MEQ tablet Take 2 tablets (20 mEq total) by mouth daily. 180 tablet 1  . predniSONE (DELTASONE) 10 MG  tablet Take 4 tablets (40 mg total) by mouth daily with breakfast. Dr. Carlean Purl will advise taper later 100 tablet 0  . ramipril (ALTACE) 10 MG capsule Take 1 capsule (10 mg total) by mouth daily. 90 capsule 1  . rOPINIRole (REQUIP) 3 MG tablet Take 1 tablet (3 mg total) by mouth at bedtime. 90 tablet 0  . triamcinolone cream (KENALOG) 0.1 % APPLY TO AFFECTED AREA ON EXTERNAL EAR TWICE DAILY. DO NOT USE FOR MORE THAN 2 WEEKS AT A TIME 30 g 0  . Vilazodone HCl (VIIBRYD) 40 MG TABS Take 40 mg by mouth every  morning.    . fluocinolone (SYNALAR) 0.025 % ointment Apply topically 2 (two) times daily. 30 g 0  . nitroGLYCERIN (NITROSTAT) 0.4 MG SL tablet Place 1 tablet (0.4 mg total) under the tongue every 5 (five) minutes as needed for up to 25 days for chest pain. (Patient not taking: Reported on 12/07/2017) 25 tablet 5   No current facility-administered medications for this visit.     Allergies: Augmentin [amoxicillin-pot clavulanate]; Erythromycin base; and Neomycin  Past Medical History:  Diagnosis Date  . Anxiety   . Arthritis   . C. difficile colitis 02/13/2014  . CAD (coronary artery disease)    nonobstructive plaque by cath 2013  . Chronic diastolic CHF (congestive heart failure) ([place]) 08/2011   EF 55-60%, nl valves  . Colitis   . Complication of anesthesia    hard time waking up 02 sats 85-89% range   . Depression   . Fatty liver 2006   [place]  . Fibula fracture   . Fracture of metacarpal shaft of left hand, closed 09/21/2016   Dr Sabra Heck  . GERD (gastroesophageal reflux disease)   . Hyperlipidemia   . Hypersomnia with sleep apnea, unspecified 01/08/2014  . Hypertension   . Hypothyroidism   . Influenzal bronchitis 07/24/2017  . Microscopic colitis 01/09/2014  . Morbid obesity ([place])    seen dietician 09/2011  . Myelolipoma 10/2010   stable L adrenal myelolipoma  . 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 OSA (obstructive sleep apnea)    medium quattro full face mask CPAP 10cm  . Osteoarthritis of left knee 2016   nontraumatic loose body with locking, primary localized OA [name])  . Rectal polyp   . RLS (restless legs syndrome)   . T2DM (type 2 diabetes mellitus) ([place])   . Vitamin B 12 deficiency    IF negative  . Vitamin D deficiency    last check 80 (11/2010)    Past Surgical History:  Procedure Laterality Date  . CARDIAC CATHETERIZATION  10/12/09   EF 70%  . CARDIAC CATHETERIZATION  08/23/2011   Mild nonobstructive CAD, normal LV fxn  . CARDIAC CATHETERIZATION   06/2016   mild nonobstructive CAD Burt Knack)  . COLONOSCOPY WITH PROPOFOL N/A 12/21/2015   HP, focal chronic active colitis [name], MD)  . ESOPHAGOGASTRODUODENOSCOPY  2006  . KNEE SURGERY Left 11-02-2008   Partial medial and lateral Menisectomies and Chondroplasty (Dr. [name])  . LEFT HEART CATH AND CORONARY ANGIOGRAPHY N/A 06/23/2016   Procedure: Left Heart Cath and Coronary Angiography;  Surgeon: Sherren Mocha, MD;  Location: [facility];  Service: Cardiovascular;  Laterality: N/A;  . LEFT HEART CATHETERIZATION WITH CORONARY ANGIOGRAM N/A 08/23/2011   Procedure: LEFT HEART CATHETERIZATION WITH CORONARY ANGIOGRAM;  Surgeon: Sherren Mocha, MD;  Location: [facility] CATH LAB;  Service: Cardiovascular;  Laterality: N/A;  . MR ABDOMEN  01/16/2011   Left 4cm adrenal myelolipoma  . ORIF METACARPAL FRACTURE Left 10/2016   [name]  . OSTEOTOMY AND ULNAR SHORTENING  2001   [name]  . US ECHOCARDIOGRAPHY  08/2011   EF of 55-60% and indeterminate diastolic function  . VENTRAL HERNIA REPAIR  03/01/2012   Procedure: LAPAROSCOPIC VENTRAL HERNIA;  Surgeon: Ralene Ok, MD;  Location: [facility] ORS;  Service: General;  Laterality: N/A;    Family History  Problem Relation Age of Onset  . Coronary artery disease Mother 30       14 angioplasty and 5v CABG  . Stroke Mother   . Diabetes Mother   . Heart attack Father 30  . Heart attack Brother 51       deceased age 54  . Colon cancer Neg Hx   . Colon polyps Neg Hx   . Esophageal cancer Neg Hx   . Kidney disease Neg Hx     Social History   Tobacco Use  . Smoking status: Former Smoker    Last attempt to quit: 08/08/1997    Years since quitting: 20.8  . Smokeless tobacco: Never Used  Substance Use Topics  . Alcohol use: No     Subjective:  Ms Wareing is here today requesting evaluation of a rash to her bilateral lower back, buttocks and anterior thights, which she first noticed yesterday am. Describes as "red scaly and extremely itchy."  She  denies fevers, chills, trouble swallowing or breathing, cp, sob, abdominal pain, nausea, vomiting, pain, drainage. She denies new medications, no recent travel. She did Use a new soap once earlier this week . Her friend gave her some fluocinolone to apply earlier and the itching and rash seemed to improve.  ROS- See HPI  Objective:  Vitals:   06/14/18 1336  BP: 130/80  Pulse: 66  Temp: 98.4 F (36.9 C)  TempSrc: Oral  SpO2: 95%  Weight: (!) 319 lb (144.7 kg)  Height: 5\' 5"  (1.651 m)    General: Well developed, well nourished, in no acute distress  Skin : Warm and dry. Mildly erythematous rash to bilateral lower back, buttocks, anterior thighs. Head: Normocephalic and atraumatic  Eyes: Sclera and conjunctiva clear; pupils round and reactive to light; extraocular movements intact  Oropharynx: Pink, supple. No suspicious lesions  Neck: Supple  Lungs: Effort unlabored, no respiratory distress. CVS exam: normal rate and regular rhythm.  Extremities: No edema, cyanosis, clubbing  Vessels: Symmetric bilaterally  Neurologic: Alert and oriented; speech intact; face symmetrical; moves all extremities well; CNII-XII intact without focal deficit  Psychiatric: Normal mood and affect.  Assessment:  1. Rash     Plan:   ?contact/irritant dermatitis Fluocinolone rx sent- medication dosing, side effects discussed Home management, red flags and return precautions including when to seek immediate care discussed and printed on AVS- she will f/u for new, worsening symptoms or if symptoms persist with fluocinolone use  No follow-ups on file.  No orders of the defined types were placed in this encounter.   Requested Prescriptions   Signed Prescriptions Disp Refills  . fluocinolone (SYNALAR) 0.025 % ointment 30 g 0    Sig: Apply topically 2 (two) times daily.

## 2018-07-11 DIAGNOSIS — F418 Other specified anxiety disorders: Secondary | ICD-10-CM | POA: Diagnosis not present

## 2018-07-12 ENCOUNTER — Encounter: Payer: Self-pay | Admitting: Family Medicine

## 2018-07-12 ENCOUNTER — Ambulatory Visit (INDEPENDENT_AMBULATORY_CARE_PROVIDER_SITE_OTHER): Payer: Medicare Other | Admitting: Family Medicine

## 2018-07-12 VITALS — BP 136/82 | HR 76 | Temp 97.9°F | Ht 65.0 in | Wt 320.4 lb

## 2018-07-12 DIAGNOSIS — R21 Rash and other nonspecific skin eruption: Secondary | ICD-10-CM

## 2018-07-12 DIAGNOSIS — E538 Deficiency of other specified B group vitamins: Secondary | ICD-10-CM

## 2018-07-12 MED ORDER — CYANOCOBALAMIN 1000 MCG/ML IJ SOLN
1000.0000 ug | Freq: Once | INTRAMUSCULAR | Status: AC
  Administered 2018-07-12: 1000 ug via INTRAMUSCULAR

## 2018-07-12 MED ORDER — TRIAMCINOLONE ACETONIDE 0.1 % EX CREA: 1.0000 "application " | TOPICAL_CREAM | Freq: Two times a day (BID) | CUTANEOUS | 1 refills | Status: AC

## 2018-07-12 MED ORDER — CYANOCOBALAMIN 1000 MCG/ML IJ SOLN: 1000.0000 ug | INTRAMUSCULAR | 3 refills | Status: AC

## 2018-07-12 NOTE — Progress Notes (Signed)
BP 136/82 (BP Location: Right Arm, Patient Position: Sitting, Cuff Size: Normal) Comment (Cuff Size): on lower arm  Pulse 76   Temp 97.9 F (36.6 C) (Oral)   Ht 5\' 5"  (1.651 m)   Wt (!) 320 lb 6 oz (145.3 kg)   SpO2 94%   BMI 53.31 kg/m    CC: rash Subjective:    Patient ID: [name], female    DOB: 12-Jan-1951, 68 y.o.   MRN: [id number]  HPI: [name] is a 68 y.o. female presenting on 07/12/2018 for Rash (C/o rash on all over. Noticed it 2-3 wks ago. Seen for it and tx. Had improved but worsened this week. )   1 wk h/o itchy rash worse on back and bottom and anterior thighs. Also present on arms. Spares face face. No longer itchy.   No recent motel stay.  No similar rash at home. No new medicines, vitamins, supplements. No new foods.  No new lotions, detergents, soaps or shampoos. No new clothes.  No recent ST or strep infection.   Seen by Caryl Pina at [facility] with rash to lower back, buttock, anterior thighs thought contact dermatitis treated with fluocinolone Rx. She also took a course of prednisone (8 pills) with benefit. However rash now returning.   Moved to [place]. In process of getting new physician. Has appt to establish 3/16.      Relevant past medical, surgical, family and social history reviewed and updated as indicated. Interim medical history since our last visit reviewed. Allergies and medications reviewed and updated. Outpatient Medications Prior to Visit  Medication Sig Dispense Refill  . ALPRAZolam (XANAX) 0.5 MG tablet Take 0.5 mg by mouth 3 (three) times daily as needed for anxiety. Takes 1/2 tablet in AM, then 1/2 tablet at noon, 1 tablet at bedtime as needed  2  . aspirin EC 81 MG tablet Take 81 mg by mouth once a week.     Marland Kitchen atorvastatin (LIPITOR) 40 MG tablet Take 1 tablet (40 mg total) by mouth at bedtime. 90 tablet 0  . fluocinolone (SYNALAR) 0.025 % ointment Apply topically 2 (two) times daily. 30 g 0  . furosemide (LASIX) 40 MG  tablet Take 1.5 tablets (60 mg total) by mouth daily. 135 tablet 1  . gabapentin (NEURONTIN) 400 MG capsule Take 1-3 capsules (400-1,200 mg total) by mouth 3 (three) times daily. Take one capsule in the morning, one capsule 7 hours later and then three capsules at bedtime. 450 capsule 0  . glimepiride (AMARYL) 1 MG tablet Take 1 tablet (1 mg total) by mouth daily with breakfast. 90 tablet 1  . lamoTRIgine (LAMICTAL) 200 MG tablet Take 200 mg by mouth daily.    Marland Kitchen levothyroxine (SYNTHROID, LEVOTHROID) 112 MCG tablet TAKE 2 TABLETS BY MOUTH EVERY DAY BEOFRE BREAKFAST 180 tablet 1  . metFORMIN (GLUCOPHAGE) 850 MG tablet Take 1 tablet (850 mg total) by mouth 2 (two) times daily with a meal. 60 tablet 4  . nystatin ointment (MYCOSTATIN) Apply 1 application daily as needed topically. Apply to red areas in groin 30 g 1  . omeprazole (PRILOSEC) 40 MG capsule Take 1 capsule (40 mg total) by mouth daily. 30 capsule 4  . potassium chloride (K-DUR) 10 MEQ tablet Take 2 tablets (20 mEq total) by mouth daily. 180 tablet 1  . predniSONE (DELTASONE) 10 MG tablet Take 4 tablets (40 mg total) by mouth daily with breakfast. Dr. Carlean Purl will advise taper later 100 tablet 0  . ramipril (  ALTACE) 10 MG capsule Take 1 capsule (10 mg total) by mouth daily. 90 capsule 1  . rOPINIRole (REQUIP) 3 MG tablet Take 1 tablet (3 mg total) by mouth at bedtime. 90 tablet 0  . Vilazodone HCl (VIIBRYD) 40 MG TABS Take 40 mg by mouth every morning.    . cyanocobalamin (,VITAMIN B-12,) 1000 MCG/ML injection Inject 1 mL (1,000 mcg total) into the muscle every 30 (thirty) days. 10 mL 3  . triamcinolone cream (KENALOG) 0.1 % APPLY TO AFFECTED AREA ON EXTERNAL EAR TWICE DAILY. DO NOT USE FOR MORE THAN 2 WEEKS AT A TIME 30 g 0  . nitroGLYCERIN (NITROSTAT) 0.4 MG SL tablet Place 1 tablet (0.4 mg total) under the tongue every 5 (five) minutes as needed for up to 25 days for chest pain. (Patient not taking: Reported on 12/07/2017) 25 tablet 5   No  facility-administered medications prior to visit.      Per HPI unless specifically indicated in ROS section below Review of Systems Objective:    BP 136/82 (BP Location: Right Arm, Patient Position: Sitting, Cuff Size: Normal) Comment (Cuff Size): on lower arm  Pulse 76   Temp 97.9 F (36.6 C) (Oral)   Ht 5\' 5"  (1.651 m)   Wt (!) 320 lb 6 oz (145.3 kg)   SpO2 94%   BMI 53.31 kg/m   Wt Readings from Last 3 Encounters:  07/12/18 (!) 320 lb 6 oz (145.3 kg)  06/14/18 (!) 319 lb (144.7 kg)  12/27/17 (!) 328 lb 8 oz (149 kg)    Physical Exam Vitals signs and nursing note reviewed.  Constitutional:      Appearance: Normal appearance.  Skin:    General: Skin is warm and dry.     Findings: Erythema and rash present.     Comments: Blanching erythematous papular rash diffusely on trunk, arms, legs with sparing of palms and soles  Coalescent papules into patches with overlying silvery scales at lower back into buttock and perianally   Neurological:     Mental Status: She is alert.       Results for orders placed or performed in visit on 10/25/17  HM DIABETES EYE EXAM  Result Value Ref Range   HM Diabetic Eye Exam  No Retinopathy   Assessment & Plan:   Problem List Items Addressed This Visit    Vitamin B12 deficiency   Relevant Medications   cyanocobalamin ((VITAMIN B-12)) injection 1,000 mcg (Completed)   cyanocobalamin (,VITAMIN B-12,) 1000 MCG/ML injection   Skin rash - Primary    Rash seems consistent with guttate psoriasis. Discussed with patient. Will prescribe steroid cream to use to more affected areas, with routine steroid precautions. She also has evidence of inverse psoriasis. Will refer to derm for further eval/management      Relevant Orders   Ambulatory referral to Dermatology       Meds ordered this encounter  Medications  . cyanocobalamin ((VITAMIN B-12)) injection 1,000 mcg  . cyanocobalamin (,VITAMIN B-12,) 1000 MCG/ML injection    Sig: Inject 1 mL  (1,000 mcg total) into the muscle every 30 (thirty) days.    Dispense:  10 mL    Refill:  3  . triamcinolone cream (KENALOG) 0.1 %    Sig: Apply 1 application topically 2 (two) times daily. Apply to AA.    Dispense:  80 g    Refill:  1   Orders Placed This Encounter  Procedures  . Ambulatory referral to Dermatology    Referral  Priority:   Routine    Referral Type:   Consultation    Referral Reason:   Specialty Services Required    Requested Specialty:   Dermatology    Number of Visits Requested:   1    Follow up plan: No follow-ups on file.  Ria Bush, MD

## 2018-07-12 NOTE — Assessment & Plan Note (Signed)
Rash seems consistent with guttate psoriasis. Discussed with patient. Will prescribe steroid cream to use to more affected areas, with routine steroid precautions. She also has evidence of inverse psoriasis. Will refer to derm for further eval/management

## 2018-07-12 NOTE — Patient Instructions (Addendum)
I think you have guttate psoriasis Treat with triamcinolone cream to more affected areas.  We will refer you to skin doctor.   Psoriasis  Psoriasis is a long-term (chronic) condition of skin inflammation. It occurs because your immune system causes skin cells to form too quickly. As a result, too many skin cells grow and create raised, red patches (plaques) that look silvery on your skin. Plaques may appear anywhere on your body. They can be any size or shape. Psoriasis can come and go. The condition varies from mild to very severe. It cannot be passed from one person to another (not contagious). What are the causes? The cause of psoriasis is not known, but certain factors can make the condition worse. These include:  Damage or trauma to the skin, such as cuts, scrapes, sunburn, and dryness.  Lack of sunlight.  Certain medicines.  Alcohol.  Tobacco use.  Stress.  Infections caused by bacteria or viruses. What increases the risk? This condition is more likely to develop in:  People with a family history of psoriasis.  People who are Caucasian.  People who are between the ages of 15-16 and 50-44 years old. What are the signs or symptoms? There are five different types of psoriasis. You can have more than one type of psoriasis during your life. Types are:  Plaque.  Guttate.  Inverse.  Pustular.  Erythrodermic. Each type of psoriasis has different symptoms.  Plaque psoriasis symptoms include red, raised plaques with a silvery white coating (scale). These plaques may be itchy. Your nails may be pitted and crumbly or fall off.  Guttate psoriasis symptoms include small red spots that often show up on your trunk, arms, and legs. These spots may develop after you have been sick, especially with strep throat.  Inverse psoriasis symptoms include plaques in your underarm area, under your breasts, or on your genitals, groin, or buttocks.  Pustular psoriasis symptoms include  pus-filled bumps that are painful, red, and swollen on the palms of your hands or the soles of your feet. You also may feel exhausted, feverish, weak, or have no appetite.  Erythrodermic psoriasis symptoms include bright red skin that may look burned. You may have a fast heartbeat and a body temperature that is too high or too low. You may be itchy or in pain. How is this diagnosed? Your health care provider may suspect psoriasis based on your symptoms and family history. Your health care provider will also do a physical exam. This may include a procedure to remove a tissue sample (biopsy) for testing. You may also be referred to a health care provider who specializes in skin diseases (dermatologist). How is this treated? There is no cure for this condition, but treatment can help manage it. Goals of treatment include:  Helping your skin heal.  Reducing itching and inflammation.  Slowing the growth of new skin cells.  Helping your immune system respond better to your skin. Treatment varies, depending on the severity of your condition. Treatment may include:  Creams or ointments.  Ultraviolet ray exposure (light therapy). This may include natural sunlight or light therapy in a medical office.  Medicines (systemic therapy). These medicines can help your body better manage skin cell turnover and inflammation. They may be used along with light therapy or ointments. You may also get antibiotic medicines if you have an infection. Follow these instructions at home: [place] your skin as needed. Only use moisturizers that have been approved by your health care provider.  Apply cool compresses to the affected areas.  Do not scratch your skin. Lifestyle   Do not use tobacco products. This includes cigarettes, chewing tobacco, and e-cigarettes. If you need help quitting, ask your health care provider.  Drink little or no alcohol.  Try techniques for stress reduction, such as  meditation or yoga.  Get exposure to the sun as told by your health care provider. Do not get sunburned.  Consider joining a psoriasis support group. Medicines  Take or use over-the-counter and prescription medicines only as told by your health care provider.  If you were prescribed an antibiotic, take or use it as told by your health care provider. Do not stop taking the antibiotic even if your condition starts to improve. General instructions  Keep a journal to help track what triggers an outbreak. Try to avoid any triggers.  See a counselor or social worker if feelings of sadness, frustration, and hopelessness about your condition are interfering with your work and relationships.  Keep all follow-up visits as told by your health care provider. This is important. Contact a health care provider if:  Your pain gets worse.  You have increasing redness or warmth in the affected areas.  You have new or worsening pain or stiffness in your joints.  Your nails start to break easily or pull away from the nail bed.  You have a fever.  You feel depressed. This information is not intended to replace advice given to you by your health care provider. Make sure you discuss any questions you have with your health care provider. Document Released: 04/28/2000 Document Revised: 10/07/2015 Document Reviewed: 09/16/2014 Elsevier Interactive Patient Education  2019 [employer].

## 2018-07-23 DIAGNOSIS — G2581 Restless legs syndrome: Secondary | ICD-10-CM | POA: Diagnosis not present

## 2018-07-23 DIAGNOSIS — F3181 Bipolar II disorder: Secondary | ICD-10-CM | POA: Diagnosis not present

## 2018-07-23 DIAGNOSIS — F419 Anxiety disorder, unspecified: Secondary | ICD-10-CM | POA: Diagnosis not present

## 2018-07-29 ENCOUNTER — Other Ambulatory Visit: Payer: Self-pay | Admitting: Family Medicine

## 2018-07-29 DIAGNOSIS — G2581 Restless legs syndrome: Secondary | ICD-10-CM | POA: Diagnosis not present

## 2018-07-29 DIAGNOSIS — F3181 Bipolar II disorder: Secondary | ICD-10-CM | POA: Diagnosis not present

## 2018-07-29 DIAGNOSIS — G4710 Hypersomnia, unspecified: Secondary | ICD-10-CM | POA: Diagnosis not present

## 2018-07-29 DIAGNOSIS — I5032 Chronic diastolic (congestive) heart failure: Secondary | ICD-10-CM | POA: Diagnosis not present

## 2018-07-29 DIAGNOSIS — E1165 Type 2 diabetes mellitus with hyperglycemia: Secondary | ICD-10-CM | POA: Diagnosis not present

## 2018-07-29 DIAGNOSIS — E782 Mixed hyperlipidemia: Secondary | ICD-10-CM | POA: Diagnosis not present

## 2018-07-29 DIAGNOSIS — I10 Essential (primary) hypertension: Secondary | ICD-10-CM | POA: Diagnosis not present

## 2018-07-29 DIAGNOSIS — E039 Hypothyroidism, unspecified: Secondary | ICD-10-CM | POA: Diagnosis not present

## 2018-07-29 DIAGNOSIS — E538 Deficiency of other specified B group vitamins: Secondary | ICD-10-CM | POA: Diagnosis not present

## 2018-07-31 DIAGNOSIS — L400 Psoriasis vulgaris: Secondary | ICD-10-CM | POA: Diagnosis not present

## 2018-07-31 DIAGNOSIS — D485 Neoplasm of uncertain behavior of skin: Secondary | ICD-10-CM | POA: Diagnosis not present

## 2018-07-31 DIAGNOSIS — L409 Psoriasis, unspecified: Secondary | ICD-10-CM | POA: Diagnosis not present

## 2018-08-15 DIAGNOSIS — F418 Other specified anxiety disorders: Secondary | ICD-10-CM | POA: Diagnosis not present

## 2018-08-30 DIAGNOSIS — F418 Other specified anxiety disorders: Secondary | ICD-10-CM | POA: Diagnosis not present

## 2018-09-04 DIAGNOSIS — L304 Erythema intertrigo: Secondary | ICD-10-CM | POA: Diagnosis not present

## 2018-09-05 DIAGNOSIS — F418 Other specified anxiety disorders: Secondary | ICD-10-CM | POA: Diagnosis not present

## 2018-09-09 DIAGNOSIS — E1165 Type 2 diabetes mellitus with hyperglycemia: Secondary | ICD-10-CM | POA: Diagnosis not present

## 2018-09-09 DIAGNOSIS — I739 Peripheral vascular disease, unspecified: Secondary | ICD-10-CM | POA: Diagnosis not present

## 2018-09-09 DIAGNOSIS — I5032 Chronic diastolic (congestive) heart failure: Secondary | ICD-10-CM | POA: Diagnosis not present

## 2018-09-10 DIAGNOSIS — L400 Psoriasis vulgaris: Secondary | ICD-10-CM | POA: Diagnosis not present

## 2018-09-11 DIAGNOSIS — L400 Psoriasis vulgaris: Secondary | ICD-10-CM | POA: Diagnosis not present

## 2018-09-13 DIAGNOSIS — F418 Other specified anxiety disorders: Secondary | ICD-10-CM | POA: Diagnosis not present

## 2018-09-16 DIAGNOSIS — L400 Psoriasis vulgaris: Secondary | ICD-10-CM | POA: Diagnosis not present

## 2018-09-19 DIAGNOSIS — F418 Other specified anxiety disorders: Secondary | ICD-10-CM | POA: Diagnosis not present

## 2018-09-24 DIAGNOSIS — L400 Psoriasis vulgaris: Secondary | ICD-10-CM | POA: Diagnosis not present

## 2018-09-26 DIAGNOSIS — F418 Other specified anxiety disorders: Secondary | ICD-10-CM | POA: Diagnosis not present

## 2018-10-02 DIAGNOSIS — L400 Psoriasis vulgaris: Secondary | ICD-10-CM | POA: Diagnosis not present

## 2018-10-03 DIAGNOSIS — F418 Other specified anxiety disorders: Secondary | ICD-10-CM | POA: Diagnosis not present

## 2018-10-17 DIAGNOSIS — F418 Other specified anxiety disorders: Secondary | ICD-10-CM | POA: Diagnosis not present

## 2018-10-18 ENCOUNTER — Telehealth: Payer: Self-pay

## 2018-10-18 NOTE — Telephone Encounter (Signed)
Yes okay to schedule.

## 2018-10-18 NOTE — Telephone Encounter (Signed)
Okay to schedule patient. 

## 2018-10-18 NOTE — Telephone Encounter (Signed)
Copied from [place] [id number]. Topic: Appointment Scheduling - Scheduling Inquiry for Clinic >> Oct 18, 2018  9:40 AM Rainey Pines A wrote: Patient is requesting a a TOC appointment because she has recently moved closer to the practice. Patient prefers a female provider and would like a callback. >> Oct 18, 2018 10:10 AM Ardeen Fillers D wrote: Patient called [facility] and is requesting to see Dr. Deborra Medina. Patient stated that her grandaughter is Clayton Bibles that sees Dr. Deborra Medina.

## 2018-10-25 DIAGNOSIS — F418 Other specified anxiety disorders: Secondary | ICD-10-CM | POA: Diagnosis not present

## 2018-10-28 DIAGNOSIS — I739 Peripheral vascular disease, unspecified: Secondary | ICD-10-CM | POA: Diagnosis not present

## 2018-10-28 DIAGNOSIS — R0609 Other forms of dyspnea: Secondary | ICD-10-CM | POA: Diagnosis not present

## 2018-10-28 DIAGNOSIS — R5382 Chronic fatigue, unspecified: Secondary | ICD-10-CM | POA: Diagnosis not present

## 2018-10-28 DIAGNOSIS — F418 Other specified anxiety disorders: Secondary | ICD-10-CM | POA: Diagnosis not present

## 2018-10-28 DIAGNOSIS — R600 Localized edema: Secondary | ICD-10-CM | POA: Diagnosis not present

## 2018-10-28 DIAGNOSIS — E1165 Type 2 diabetes mellitus with hyperglycemia: Secondary | ICD-10-CM | POA: Diagnosis not present

## 2018-11-04 NOTE — Telephone Encounter (Signed)
Done

## 2018-11-11 DIAGNOSIS — F418 Other specified anxiety disorders: Secondary | ICD-10-CM | POA: Diagnosis not present

## 2018-11-19 DIAGNOSIS — L400 Psoriasis vulgaris: Secondary | ICD-10-CM | POA: Diagnosis not present

## 2018-11-25 DIAGNOSIS — F418 Other specified anxiety disorders: Secondary | ICD-10-CM | POA: Diagnosis not present

## 2018-12-04 DIAGNOSIS — R0609 Other forms of dyspnea: Secondary | ICD-10-CM | POA: Diagnosis not present

## 2018-12-04 DIAGNOSIS — Z6841 Body Mass Index (BMI) 40.0 and over, adult: Secondary | ICD-10-CM | POA: Diagnosis not present

## 2018-12-04 DIAGNOSIS — R5382 Chronic fatigue, unspecified: Secondary | ICD-10-CM | POA: Diagnosis not present

## 2018-12-04 DIAGNOSIS — I739 Peripheral vascular disease, unspecified: Secondary | ICD-10-CM | POA: Diagnosis not present

## 2018-12-06 DIAGNOSIS — F418 Other specified anxiety disorders: Secondary | ICD-10-CM | POA: Diagnosis not present

## 2018-12-16 DIAGNOSIS — F418 Other specified anxiety disorders: Secondary | ICD-10-CM | POA: Diagnosis not present

## 2018-12-23 ENCOUNTER — Encounter: Payer: Medicare Other | Admitting: Family Medicine

## 2018-12-23 DIAGNOSIS — F418 Other specified anxiety disorders: Secondary | ICD-10-CM | POA: Diagnosis not present

## 2018-12-30 DIAGNOSIS — I10 Essential (primary) hypertension: Secondary | ICD-10-CM | POA: Diagnosis not present

## 2018-12-30 DIAGNOSIS — G4733 Obstructive sleep apnea (adult) (pediatric): Secondary | ICD-10-CM | POA: Diagnosis not present

## 2018-12-30 DIAGNOSIS — I2510 Atherosclerotic heart disease of native coronary artery without angina pectoris: Secondary | ICD-10-CM | POA: Diagnosis not present

## 2018-12-30 DIAGNOSIS — I5032 Chronic diastolic (congestive) heart failure: Secondary | ICD-10-CM | POA: Diagnosis not present

## 2019-01-03 DIAGNOSIS — F418 Other specified anxiety disorders: Secondary | ICD-10-CM | POA: Diagnosis not present

## 2019-01-08 DIAGNOSIS — K219 Gastro-esophageal reflux disease without esophagitis: Secondary | ICD-10-CM | POA: Diagnosis not present

## 2019-01-08 DIAGNOSIS — G4733 Obstructive sleep apnea (adult) (pediatric): Secondary | ICD-10-CM | POA: Diagnosis not present

## 2019-01-08 DIAGNOSIS — E1100 Type 2 diabetes mellitus with hyperosmolarity without nonketotic hyperglycemic-hyperosmolar coma (NKHHC): Secondary | ICD-10-CM | POA: Diagnosis not present

## 2019-01-17 DIAGNOSIS — F418 Other specified anxiety disorders: Secondary | ICD-10-CM | POA: Diagnosis not present

## 2019-01-20 DIAGNOSIS — Z23 Encounter for immunization: Secondary | ICD-10-CM | POA: Diagnosis not present

## 2019-01-31 DIAGNOSIS — F418 Other specified anxiety disorders: Secondary | ICD-10-CM | POA: Diagnosis not present

## 2019-02-02 DIAGNOSIS — F4323 Adjustment disorder with mixed anxiety and depressed mood: Secondary | ICD-10-CM | POA: Diagnosis not present

## 2019-02-05 DIAGNOSIS — G4733 Obstructive sleep apnea (adult) (pediatric): Secondary | ICD-10-CM | POA: Diagnosis not present

## 2019-02-05 DIAGNOSIS — K219 Gastro-esophageal reflux disease without esophagitis: Secondary | ICD-10-CM | POA: Diagnosis not present

## 2019-02-05 DIAGNOSIS — E1100 Type 2 diabetes mellitus with hyperosmolarity without nonketotic hyperglycemic-hyperosmolar coma (NKHHC): Secondary | ICD-10-CM | POA: Diagnosis not present

## 2019-02-07 DIAGNOSIS — F418 Other specified anxiety disorders: Secondary | ICD-10-CM | POA: Diagnosis not present

## 2019-02-11 DIAGNOSIS — R109 Unspecified abdominal pain: Secondary | ICD-10-CM | POA: Diagnosis not present

## 2019-02-11 DIAGNOSIS — K760 Fatty (change of) liver, not elsewhere classified: Secondary | ICD-10-CM | POA: Diagnosis not present

## 2019-02-17 DIAGNOSIS — E785 Hyperlipidemia, unspecified: Secondary | ICD-10-CM | POA: Diagnosis not present

## 2019-02-17 DIAGNOSIS — I10 Essential (primary) hypertension: Secondary | ICD-10-CM | POA: Diagnosis not present

## 2019-02-17 DIAGNOSIS — E1159 Type 2 diabetes mellitus with other circulatory complications: Secondary | ICD-10-CM | POA: Diagnosis not present

## 2019-02-17 DIAGNOSIS — E1169 Type 2 diabetes mellitus with other specified complication: Secondary | ICD-10-CM | POA: Diagnosis not present

## 2019-02-17 DIAGNOSIS — I5032 Chronic diastolic (congestive) heart failure: Secondary | ICD-10-CM | POA: Diagnosis not present

## 2019-02-28 DIAGNOSIS — F418 Other specified anxiety disorders: Secondary | ICD-10-CM | POA: Diagnosis not present

## 2019-03-07 DIAGNOSIS — F418 Other specified anxiety disorders: Secondary | ICD-10-CM | POA: Diagnosis not present

## 2019-03-20 DIAGNOSIS — F418 Other specified anxiety disorders: Secondary | ICD-10-CM | POA: Diagnosis not present

## 2019-04-02 DIAGNOSIS — F3181 Bipolar II disorder: Secondary | ICD-10-CM | POA: Diagnosis not present

## 2019-04-02 DIAGNOSIS — I10 Essential (primary) hypertension: Secondary | ICD-10-CM | POA: Diagnosis not present

## 2019-04-02 DIAGNOSIS — E039 Hypothyroidism, unspecified: Secondary | ICD-10-CM | POA: Diagnosis not present

## 2019-04-02 DIAGNOSIS — E559 Vitamin D deficiency, unspecified: Secondary | ICD-10-CM | POA: Diagnosis not present

## 2019-04-02 DIAGNOSIS — Z6841 Body Mass Index (BMI) 40.0 and over, adult: Secondary | ICD-10-CM | POA: Diagnosis not present

## 2019-04-02 DIAGNOSIS — G4733 Obstructive sleep apnea (adult) (pediatric): Secondary | ICD-10-CM | POA: Diagnosis not present

## 2019-04-02 DIAGNOSIS — E782 Mixed hyperlipidemia: Secondary | ICD-10-CM | POA: Diagnosis not present

## 2019-04-02 DIAGNOSIS — E1165 Type 2 diabetes mellitus with hyperglycemia: Secondary | ICD-10-CM | POA: Diagnosis not present

## 2019-04-02 DIAGNOSIS — I5032 Chronic diastolic (congestive) heart failure: Secondary | ICD-10-CM | POA: Diagnosis not present

## 2019-04-02 DIAGNOSIS — F419 Anxiety disorder, unspecified: Secondary | ICD-10-CM | POA: Diagnosis not present

## 2019-04-02 DIAGNOSIS — I2510 Atherosclerotic heart disease of native coronary artery without angina pectoris: Secondary | ICD-10-CM | POA: Diagnosis not present

## 2019-04-02 DIAGNOSIS — R5382 Chronic fatigue, unspecified: Secondary | ICD-10-CM | POA: Diagnosis not present

## 2019-04-03 DIAGNOSIS — K219 Gastro-esophageal reflux disease without esophagitis: Secondary | ICD-10-CM | POA: Diagnosis not present

## 2019-04-03 DIAGNOSIS — G4733 Obstructive sleep apnea (adult) (pediatric): Secondary | ICD-10-CM | POA: Diagnosis not present

## 2019-04-03 DIAGNOSIS — E1100 Type 2 diabetes mellitus with hyperosmolarity without nonketotic hyperglycemic-hyperosmolar coma (NKHHC): Secondary | ICD-10-CM | POA: Diagnosis not present

## 2019-04-05 DIAGNOSIS — F418 Other specified anxiety disorders: Secondary | ICD-10-CM | POA: Diagnosis not present

## 2019-04-18 DIAGNOSIS — F418 Other specified anxiety disorders: Secondary | ICD-10-CM | POA: Diagnosis not present

## 2019-04-29 ENCOUNTER — Other Ambulatory Visit: Payer: Self-pay | Admitting: Family Medicine

## 2019-05-14 DIAGNOSIS — F418 Other specified anxiety disorders: Secondary | ICD-10-CM | POA: Diagnosis not present

## 2019-05-29 DIAGNOSIS — F418 Other specified anxiety disorders: Secondary | ICD-10-CM | POA: Diagnosis not present

## 2019-06-19 DIAGNOSIS — F418 Other specified anxiety disorders: Secondary | ICD-10-CM | POA: Diagnosis not present

## 2019-07-03 DIAGNOSIS — F418 Other specified anxiety disorders: Secondary | ICD-10-CM | POA: Diagnosis not present

## 2019-07-08 DIAGNOSIS — I739 Peripheral vascular disease, unspecified: Secondary | ICD-10-CM | POA: Diagnosis not present

## 2019-07-08 DIAGNOSIS — G4733 Obstructive sleep apnea (adult) (pediatric): Secondary | ICD-10-CM | POA: Diagnosis not present

## 2019-07-08 DIAGNOSIS — I2510 Atherosclerotic heart disease of native coronary artery without angina pectoris: Secondary | ICD-10-CM | POA: Diagnosis not present

## 2019-07-08 DIAGNOSIS — F3181 Bipolar II disorder: Secondary | ICD-10-CM | POA: Diagnosis not present

## 2019-07-08 DIAGNOSIS — Z6841 Body Mass Index (BMI) 40.0 and over, adult: Secondary | ICD-10-CM | POA: Diagnosis not present

## 2019-07-08 DIAGNOSIS — E1165 Type 2 diabetes mellitus with hyperglycemia: Secondary | ICD-10-CM | POA: Diagnosis not present

## 2019-07-08 DIAGNOSIS — E039 Hypothyroidism, unspecified: Secondary | ICD-10-CM | POA: Diagnosis not present

## 2019-07-08 DIAGNOSIS — I10 Essential (primary) hypertension: Secondary | ICD-10-CM | POA: Diagnosis not present

## 2019-07-17 DIAGNOSIS — F418 Other specified anxiety disorders: Secondary | ICD-10-CM | POA: Diagnosis not present

## 2019-07-29 ENCOUNTER — Other Ambulatory Visit: Payer: Self-pay | Admitting: Family Medicine

## 2019-08-27 ENCOUNTER — Encounter: Payer: Self-pay | Admitting: Cardiovascular Disease

## 2020-06-17 ENCOUNTER — Telehealth: Payer: Self-pay

## 2020-06-17 NOTE — Telephone Encounter (Signed)
[name] with [facility] with [place] left v/m requesting status of cpap rx being signed. I spoke with pt because she has moved and pt said she would call [facility] to give them the correct provider to send this info to. Nothing further needed at this time.

## 2020-07-29 ENCOUNTER — Ambulatory Visit (INDEPENDENT_AMBULATORY_CARE_PROVIDER_SITE_OTHER): Payer: Medicare Other | Admitting: Family Medicine

## 2020-08-04 ENCOUNTER — Other Ambulatory Visit: Payer: Self-pay

## 2020-08-04 ENCOUNTER — Ambulatory Visit (INDEPENDENT_AMBULATORY_CARE_PROVIDER_SITE_OTHER): Payer: Medicare Other | Admitting: Family Medicine

## 2020-08-04 ENCOUNTER — Encounter (INDEPENDENT_AMBULATORY_CARE_PROVIDER_SITE_OTHER): Payer: Self-pay | Admitting: Family Medicine

## 2020-08-04 VITALS — BP 128/70 | HR 67 | Temp 97.9°F | Ht 65.0 in | Wt 318.0 lb

## 2020-08-04 DIAGNOSIS — Z6841 Body Mass Index (BMI) 40.0 and over, adult: Secondary | ICD-10-CM

## 2020-08-04 DIAGNOSIS — R0602 Shortness of breath: Secondary | ICD-10-CM

## 2020-08-04 DIAGNOSIS — F32A Depression, unspecified: Secondary | ICD-10-CM | POA: Diagnosis not present

## 2020-08-04 DIAGNOSIS — R5383 Other fatigue: Secondary | ICD-10-CM

## 2020-08-04 DIAGNOSIS — Z0289 Encounter for other administrative examinations: Secondary | ICD-10-CM

## 2020-08-04 DIAGNOSIS — F419 Anxiety disorder, unspecified: Secondary | ICD-10-CM

## 2020-08-04 DIAGNOSIS — E1165 Type 2 diabetes mellitus with hyperglycemia: Secondary | ICD-10-CM

## 2020-08-04 DIAGNOSIS — E1159 Type 2 diabetes mellitus with other circulatory complications: Secondary | ICD-10-CM

## 2020-08-04 DIAGNOSIS — E559 Vitamin D deficiency, unspecified: Secondary | ICD-10-CM

## 2020-08-04 DIAGNOSIS — G4733 Obstructive sleep apnea (adult) (pediatric): Secondary | ICD-10-CM

## 2020-08-04 DIAGNOSIS — E038 Other specified hypothyroidism: Secondary | ICD-10-CM

## 2020-08-04 DIAGNOSIS — I152 Hypertension secondary to endocrine disorders: Secondary | ICD-10-CM

## 2020-08-05 ENCOUNTER — Encounter (INDEPENDENT_AMBULATORY_CARE_PROVIDER_SITE_OTHER): Payer: Self-pay | Admitting: Family Medicine

## 2020-08-05 LAB — COMPREHENSIVE METABOLIC PANEL
ALT: 20 IU/L (ref 0–32)
AST: 18 IU/L (ref 0–40)
Albumin/Globulin Ratio: 1.7 (ref 1.2–2.2)
Albumin: 4.1 g/dL (ref 3.8–4.8)
Alkaline Phosphatase: 70 IU/L (ref 44–121)
BUN/Creatinine Ratio: 18 (ref 12–28)
BUN: 18 mg/dL (ref 8–27)
Bilirubin Total: 0.5 mg/dL (ref 0.0–1.2)
CO2: 28 mmol/L (ref 20–29)
Calcium: 9.1 mg/dL (ref 8.7–10.3)
Chloride: 96 mmol/L (ref 96–106)
Creatinine, Ser: 0.99 mg/dL (ref 0.57–1.00)
Globulin, Total: 2.4 g/dL (ref 1.5–4.5)
Glucose: 133 mg/dL — ABNORMAL HIGH (ref 65–99)
Potassium: 5 mmol/L (ref 3.5–5.2)
Sodium: 141 mmol/L (ref 134–144)
Total Protein: 6.5 g/dL (ref 6.0–8.5)
eGFR: 62 mL/min/{1.73_m2} (ref >59)

## 2020-08-05 LAB — T4: T4, Total: 9.4 ug/dL (ref 4.5–12.0)

## 2020-08-05 LAB — VITAMIN D 25 HYDROXY (VIT D DEFICIENCY, FRACTURES): Vit D, 25-Hydroxy: 14.9 ng/mL — ABNORMAL LOW (ref 30.0–100.0)

## 2020-08-05 LAB — TSH: TSH: 2.48 u[IU]/mL (ref 0.450–4.500)

## 2020-08-05 LAB — INSULIN, RANDOM: INSULIN: 15.9 u[IU]/mL (ref 2.6–24.9)

## 2020-08-05 LAB — T3: T3, Total: 76 ng/dL (ref 71–180)

## 2020-08-05 NOTE — Telephone Encounter (Signed)
Please advise 

## 2020-08-09 NOTE — Progress Notes (Signed)
Dear Dr. Elsie Trujillo,   Thank you for referring [name] to our clinic. The following note includes my evaluation and treatment recommendations.  Chief Complaint:   OBESITY [name] (MR# [id number]) is a 70 y.o. female who presents for evaluation and treatment of obesity and related comorbidities. Current BMI is Body mass index is 52.92 kg/m. [name] has been struggling with her weight for many years and has been unsuccessful in either losing weight, maintaining weight loss, or reaching her healthy weight goal.  [name] is currently in the action stage of change and ready to dedicate time achieving and maintaining a healthier weight. [name] is interested in becoming our patient and working on intensive lifestyle modifications including (but not limited to) diet and exercise for weight loss.  [name] was referred by Dr. Noemi Trujillo. She needs a knee replacement. She is skipping breakfast and lunch. Coffee in the morning with sugar (5tsp); May eat grapefruit in between morning and afternoon; dinner- may cook pork loin or chicken (1 thigh and leg) and hamburger patty with onions and mushrooms (pt makes) 2 oz each with green beans (1 cup) and sweet potato (1 cup) (felt full); after dinner- grapefruit (2 cups), popcorn (skinny pop bags).  [name]'s habits were reviewed today and are as follows: Her family eats meals together, she thinks her family will eat healthier with her, her desired weight loss is 168 lbs, she has been heavy most of her life, she started gaining weight 20 years ago, her heaviest weight ever was 342 pounds, she has significant food cravings issues, she snacks frequently in the evenings, she skips meals frequently, she is frequently drinking liquids with calories, she frequently makes poor food choices, she has problems with excessive hunger, she frequently eats larger portions than normal, she has binge eating behaviors and she struggles with emotional eating.  Depression  Screen [name]'s Food and Mood (modified PHQ-9) score was 16.  Depression screen [name] 2/9 08/04/2020  Decreased Interest 3  Down, Depressed, Hopeless 2  PHQ - 2 Score 5  Altered sleeping 1  Tired, decreased energy 3  Change in appetite 2  Feeling bad or failure about yourself  2  Trouble concentrating 0  Moving slowly or fidgety/restless 3  Suicidal thoughts 0  PHQ-9 Score 16  Difficult doing work/chores Somewhat difficult  Some recent data might be hidden   Subjective:   1. Other fatigue [name] admits to daytime somnolence and admits to waking up still tired. Patent has a history of symptoms of daytime fatigue, morning fatigue and morning headache. [name] generally gets 5 or 6 hours of sleep per night, and states that she has generally restful sleep. Snoring is not present due to CPAP use. Apneic episodes are present. Epworth Sleepiness Score is 20. EKG normal sinus rhythm at 67 bpm.  2. SOB (shortness of breath) on exertion [name] notes increasing shortness of breath with exercising and seems to be worsening over time with weight gain. She notes getting out of breath sooner with activity than she used to. This has gotten worse recently. [name] denies shortness of breath at rest or orthopnea. EKG normal sinus rhythm at 67 bpm.  3. Type 2 diabetes mellitus with hyperglycemia, without long-term current use of insulin ([name]) [name]'s last A1c was 8.1 in January 2022. She is on Metformin, glimepiride, and Rybelsus. She is not checking her blood sugar. She has no feelings of hypoglycemia. Her last eye exam was 3 years ago.  4. Anxiety and depression  [name] is on Viibryd, Lamictal, and Xanax. She saw psychiatrist in 2012 and was started on these meds. She is still talking to psychologist every 2 weeks.  5. Hypertension associated with diabetes ([name]) [name] is on Ramipril. Her BP was slightly elevated initially.  Pt denies chest pain, chest pressure and headache.  BP Readings from  Last 3 Encounters:  08/04/20 128/70  07/12/18 136/82  06/14/18 130/80    6. Other specified hypothyroidism [name] is on levothyroxine 224 mcg. Pt denies cold or hot intolerances or palpitations. Her last TSH was >40.  7. Vitamin D deficiency Pt denies nausea, vomiting, and muscle weakness but notes fatigue. Pt is not on Vit D supplementation currently.  8. OSA (obstructive sleep apnea) [name] wears a CPAP nightly. She doesn't wear it all night, as she falls asleep watching tv, but when she wakes up she puts it on.   Assessment/Plan:   1. Other fatigue [name] does feel that her weight is causing her energy to be lower than it should be. Fatigue may be related to obesity, depression or many other causes. Labs will be ordered, and in the meanwhile, [name] will focus on self care including making healthy food choices, increasing physical activity and focusing on stress reduction. Check labs today.  - EKG 12-Lead  2. SOB (shortness of breath) on exertion [name] does feel that she gets out of breath more easily that she used to when she exercises. [name]'s shortness of breath appears to be obesity related and exercise induced. She has agreed to work on weight loss and gradually increase exercise to treat her exercise induced shortness of breath. Will continue to monitor closely.  3. Type 2 diabetes mellitus with hyperglycemia, without long-term current use of insulin (HCC) Good blood sugar control is important to decrease the likelihood of diabetic complications such as nephropathy, neuropathy, limb loss, blindness, coronary artery disease, and death. Intensive lifestyle modification including diet, exercise and weight loss are the first line of treatment for diabetes. Check labs today. Hypoglycemia protocol discussed today.  - Insulin, random  4. Anxiety and depression Behavior modification techniques were discussed today to help [name] deal with her anxiety.  Orders and follow up as  documented in patient record. Follow up at next appointment.  5. Hypertension associated with diabetes ([place]) [name] is working on healthy weight loss and exercise to improve blood pressure control. We will watch for signs of hypotension as she continues her lifestyle modifications. Check labs today.  - Comprehensive metabolic panel  6. Other specified hypothyroidism Patient with long-standing hypothyroidism, on levothyroxine therapy. She appears euthyroid. Orders and follow up as documented in patient record. Check labs today.  Counseling . Good thyroid control is important for overall health. Supratherapeutic thyroid levels are dangerous and will not improve weight loss results. . The correct way to take levothyroxine is fasting, with water, separated by at least 30 minutes from breakfast, and separated by more than 4 hours from calcium, iron, multivitamins, acid reflux medications (PPIs).   - T3 - T4 - TSH  7. Vitamin D deficiency Low Vitamin D level contributes to fatigue and are associated with obesity, breast, and colon cancer. She agrees to follow-up for routine testing of Vitamin D, at least 2-3 times per year to avoid over-replacement. Check labs today.  - VITAMIN D 25 Hydroxy (Vit-D Deficiency, Fractures)  8. OSA (obstructive sleep apnea) Intensive lifestyle modifications are the first line treatment for this issue. We discussed several lifestyle modifications today and she will continue to  work on diet, exercise and weight loss efforts. We will continue to monitor. Orders and follow up as documented in patient record. Follow up at next appointment.  9. Class 3 severe obesity with serious comorbidity and body mass index (BMI) of 50.0 to 59.9 in adult, unspecified obesity type (HCC) Jazmynn is currently in the action stage of change and her goal is to continue with weight loss efforts. I recommend Tailor begin the structured treatment plan as follows:  She has agreed to the  Category 3 Plan.  Exercise goals: No exercise has been prescribed at this time.   Behavioral modification strategies: planning for success.  She was informed of the importance of frequent follow-up visits to maximize her success with intensive lifestyle modifications for her multiple health conditions. She was informed we would discuss her lab results at her next visit unless there is a critical issue that needs to be addressed sooner. Tashaya agreed to keep her next visit at the agreed upon time to discuss these results.  Objective:   Blood pressure 128/70, pulse 67, temperature 97.9 F (36.6 C), temperature source Oral, height 5\' 5"  (1.651 m), weight (!) 318 lb (144.2 kg), SpO2 96 %. Body mass index is 52.92 kg/m.  EKG: Normal sinus rhythm, rate 67.  Indirect Calorimeter completed today shows a VO2 of 281 and a REE of 1959.  Her calculated basal metabolic rate is 7619 thus her basal metabolic rate is worse than expected.  General: Cooperative, alert, well developed, in no acute distress. HEENT: Conjunctivae and lids unremarkable. Cardiovascular: Regular rhythm.  Lungs: Normal work of breathing. Neurologic: No focal deficits.   Lab Results  Component Value Date   CREATININE 0.99 08/04/2020   BUN 18 08/04/2020   NA 141 08/04/2020   K 5.0 08/04/2020   CL 96 08/04/2020   CO2 28 08/04/2020   Lab Results  Component Value Date   ALT 20 08/04/2020   AST 18 08/04/2020   ALKPHOS 70 08/04/2020   BILITOT 0.5 08/04/2020   Lab Results  Component Value Date   HGBA1C 7.8 09/25/2017   HGBA1C 8.3 (H) 01/17/2017   HGBA1C 8.7 (H) 09/13/2016   HGBA1C 7.2 (H) 12/17/2015   HGBA1C 8.0 (H) 07/02/2015   Lab Results  Component Value Date   INSULIN 15.9 08/04/2020   Lab Results  Component Value Date   TSH 2.480 08/04/2020   Lab Results  Component Value Date   CHOL 133 01/17/2017   HDL 40.90 01/17/2017   LDLCALC 56 01/17/2017   LDLDIRECT 77.0 09/13/2016   TRIG 182.0 (H) 01/17/2017    CHOLHDL 3 01/17/2017   Lab Results  Component Value Date   WBC 5.2 06/20/2016   HGB 12.8 06/20/2016   HCT 38.9 06/20/2016   MCV 89.6 06/20/2016   PLT 252.0 06/20/2016    Attestation Statements:   Reviewed by clinician on day of visit: allergies, medications, problem list, medical history, surgical history, family history, social history, and previous encounter notes.  Time spent on visit including pre-visit chart review and post-visit charting and care was 60 minutes.   This is the patient's first visit at [facility]. The patient's NEW PATIENT PACKET was reviewed at length. Included in the packet: current and past health history, medications, allergies, ROS, gynecologic history (women only), surgical history, family history, social history, weight history, weight loss surgery history (for those that have had weight loss surgery), nutritional evaluation, mood and food questionnaire, PHQ9, Epworth questionnaire, sleep habits questionnaire, patient life and  health improvement goals questionnaire. These will all be scanned into the patient's chart under media.   During the visit, I independently reviewed the patient's EKG, bioimpedance scale results, and indirect calorimeter results. I used this information to tailor a meal plan for the patient that will help her to lose weight and will improve her obesity-related conditions going forward. I performed a medically necessary appropriate examination and/or evaluation. I discussed the assessment and treatment plan with the patient. The patient was provided an opportunity to ask questions and all were answered. The patient agreed with the plan and demonstrated an understanding of the instructions. Labs were ordered at this visit and will be reviewed at the next visit unless more critical results need to be addressed immediately. Clinical information was updated and documented in the EMR.   Time spent on visit including pre-visit chart  review and post-visit care was 60 minutes.   A separate 15 minutes was spent on risk counseling (see above).    Coral Ceo, am acting as transcriptionist for Coralie Common, MD.   I have reviewed the above documentation for accuracy and completeness, and I agree with the above. - Jinny Blossom, MD

## 2020-08-12 ENCOUNTER — Ambulatory Visit (INDEPENDENT_AMBULATORY_CARE_PROVIDER_SITE_OTHER): Payer: Medicare HMO | Admitting: Family Medicine

## 2020-08-17 ENCOUNTER — Ambulatory Visit (INDEPENDENT_AMBULATORY_CARE_PROVIDER_SITE_OTHER): Payer: Medicare HMO | Admitting: Family Medicine

## 2020-08-18 ENCOUNTER — Ambulatory Visit (INDEPENDENT_AMBULATORY_CARE_PROVIDER_SITE_OTHER): Payer: Medicare Other | Admitting: Family Medicine

## 2020-08-18 ENCOUNTER — Other Ambulatory Visit: Payer: Self-pay

## 2020-08-18 ENCOUNTER — Encounter (INDEPENDENT_AMBULATORY_CARE_PROVIDER_SITE_OTHER): Payer: Self-pay | Admitting: Family Medicine

## 2020-08-18 VITALS — BP 103/68 | HR 77 | Temp 98.0°F | Ht 65.0 in | Wt 309.0 lb

## 2020-08-18 DIAGNOSIS — E1159 Type 2 diabetes mellitus with other circulatory complications: Secondary | ICD-10-CM

## 2020-08-18 DIAGNOSIS — E559 Vitamin D deficiency, unspecified: Secondary | ICD-10-CM

## 2020-08-18 DIAGNOSIS — E038 Other specified hypothyroidism: Secondary | ICD-10-CM | POA: Diagnosis not present

## 2020-08-18 DIAGNOSIS — E1165 Type 2 diabetes mellitus with hyperglycemia: Secondary | ICD-10-CM

## 2020-08-18 DIAGNOSIS — Z6841 Body Mass Index (BMI) 40.0 and over, adult: Secondary | ICD-10-CM

## 2020-08-18 DIAGNOSIS — I152 Hypertension secondary to endocrine disorders: Secondary | ICD-10-CM

## 2020-08-18 MED ORDER — GLIMEPIRIDE 1 MG PO TABS: 0.5000 mg | ORAL_TABLET | Freq: Every day | ORAL | 0 refills | Status: AC

## 2020-08-18 MED ORDER — VITAMIN D (ERGOCALCIFEROL) 1.25 MG (50000 UNIT) PO CAPS: 50000.0000 [IU] | ORAL_CAPSULE | ORAL | 0 refills | Status: AC

## 2020-08-23 NOTE — Progress Notes (Signed)
Chief Complaint:   OBESITY [name] is here to discuss her progress with her obesity treatment plan along with follow-up of her obesity related diagnoses. [name] is on the Category 3 Plan and states she is following her eating plan approximately 98% of the time. [name] states she is not currently exercising.  Today's visit was #: 2 Starting weight: 318 lbs Starting date: 08/04/2020 Today's weight: 309 lbs Today's date: 08/18/2020 Total lbs lost to date: 9 Total lbs lost since last in-office visit: 9  Interim History: First few weeks pt occasionally felt was too much food. She sometimes felt nausea with amount of food on plan. Pt is very pleased with results. Pt has no obstacles in upcoming weeks. She is looking for more fruit options. She is interested in working in spaghetti.  Subjective:   1. Type 2 diabetes mellitus with hyperglycemia, without long-term current use of insulin (HCC) [name] has no feelings of hypoglycemia.  Lab Results  Component Value Date   HGBA1C 7.8 09/25/2017   HGBA1C 8.3 (H) 01/17/2017   HGBA1C 8.7 (H) 09/13/2016   Lab Results  Component Value Date   MICROALBUR 1.9 09/29/2014   LDLCALC 56 01/17/2017   CREATININE 0.99 08/04/2020   Lab Results  Component Value Date   INSULIN 15.9 08/04/2020    2. Vitamin D deficiency [name] denies nausea, vomiting, and muscle weakness but notes fatigue. Pt is on not Vit D supplement. Her Vit D level is 14.9.  3. Other specified hypothyroidism [name]'s last TSH was 42.849. It is now within normal limits at 2.480 as of 08/04/2020. Her T3 and T4 are within normal limits.   Ref. Range 08/04/2020 09:35  TSH Latest Ref Range: 0.450 - 4.500 uIU/mL 2.480   4. Hypertension associated with diabetes ([place]) [name]'s BP is very well controlled. She denies dizziness or lightheadedness.    BP Readings from Last 3 Encounters:  08/18/20 103/68  08/04/20 128/70  07/12/18 136/82    Assessment/Plan:   1. Type 2  diabetes mellitus with hyperglycemia, without long-term current use of insulin (HCC) Good blood sugar control is important to decrease the likelihood of diabetic complications such as nephropathy, neuropathy, limb loss, blindness, coronary artery disease, and death. Intensive lifestyle modification including diet, exercise and weight loss are the first line of treatment for diabetes. Decrease glimepiride to 0.5 mg PO daily (cut pill in half).  - glimepiride (AMARYL) 1 MG tablet; Take 0.5 tablets (0.5 mg total) by mouth daily with breakfast.  Dispense: 30 tablet; Refill: 0  2. Vitamin D deficiency Low Vitamin D level contributes to fatigue and are associated with obesity, breast, and colon cancer. She agrees to start to take prescription Vitamin D @50 ,000 IU every week and will follow-up for routine testing of Vitamin D, at least 2-3 times per year to avoid over-replacement.  - Vitamin D, Ergocalciferol, (DRISDOL) 1.25 MG (50000 UNIT) CAPS capsule; Take 1 capsule (50,000 Units total) by mouth every 7 (seven) days.  Dispense: 4 capsule; Refill: 0  3. Other specified hypothyroidism Patient with long-standing hypothyroidism, on levothyroxine therapy. She appears euthyroid. Orders and follow up as documented in patient record. Repeat labs in 3 months.  Counseling . Good thyroid control is important for overall health. Supratherapeutic thyroid levels are dangerous and will not improve weight loss results. . The correct way to take levothyroxine is fasting, with water, separated by at least 30 minutes from breakfast, and separated by more than 4 hours from calcium, iron, multivitamins, acid reflux medications (  PPIs).   4. Hypertension associated with diabetes ([place]) [name] is working on healthy weight loss and exercise to improve blood pressure control. We will watch for signs of hypotension as she continues her lifestyle modifications. Follow up BP at next appointment. Decrease meds at next appointment  is BP is low and controlled again.  5. Class 3 severe obesity with serious comorbidity and body mass index (BMI) of 50.0 to 59.9 in adult, unspecified obesity type ([place]) [name] is currently in the action stage of change. As such, her goal is to continue with weight loss efforts. She has agreed to the Category 3 Plan.   Exercise goals: No exercise has been prescribed at this time.  Behavioral modification strategies: increasing lean protein intake, meal planning and cooking strategies, keeping healthy foods in the home and planning for success.  [name] has agreed to follow-up with our clinic in 2-3 weeks. She was informed of the importance of frequent follow-up visits to maximize her success with intensive lifestyle modifications for her multiple health conditions.   Objective:   Blood pressure 103/68, pulse 77, temperature 98 F (36.7 C), height 5\' 5"  (1.651 m), weight (!) 309 lb (140.2 kg), SpO2 97 %. Body mass index is 51.42 kg/m.  General: Cooperative, alert, well developed, in no acute distress. HEENT: Conjunctivae and lids unremarkable. Cardiovascular: Regular rhythm.  Lungs: Normal work of breathing. Neurologic: No focal deficits.   Lab Results  Component Value Date   CREATININE 0.99 08/04/2020   BUN 18 08/04/2020   NA 141 08/04/2020   K 5.0 08/04/2020   CL 96 08/04/2020   CO2 28 08/04/2020   Lab Results  Component Value Date   ALT 20 08/04/2020   AST 18 08/04/2020   ALKPHOS 70 08/04/2020   BILITOT 0.5 08/04/2020   Lab Results  Component Value Date   HGBA1C 7.8 09/25/2017   HGBA1C 8.3 (H) 01/17/2017   HGBA1C 8.7 (H) 09/13/2016   HGBA1C 7.2 (H) 12/17/2015   HGBA1C 8.0 (H) 07/02/2015   Lab Results  Component Value Date   INSULIN 15.9 08/04/2020   Lab Results  Component Value Date   TSH 2.480 08/04/2020   Lab Results  Component Value Date   CHOL 133 01/17/2017   HDL 40.90 01/17/2017   LDLCALC 56 01/17/2017   LDLDIRECT 77.0 09/13/2016   TRIG 182.0 (H)  01/17/2017   CHOLHDL 3 01/17/2017   Lab Results  Component Value Date   WBC 5.2 06/20/2016   HGB 12.8 06/20/2016   HCT 38.9 06/20/2016   MCV 89.6 06/20/2016   PLT 252.0 06/20/2016   No results found for: IRON, TIBC, FERRITIN  Obesity Behavioral Intervention:   Approximately 15 minutes were spent on the discussion below.  ASK: We discussed the diagnosis of obesity with [name] today and [name] agreed to give [place] permission to discuss obesity behavioral modification therapy today.  ASSESS: [name] has the diagnosis of obesity and her BMI today is 51.5. [name] is in the action stage of change.   ADVISE: [name] was educated on the multiple health risks of obesity as well as the benefit of weight loss to improve her health. She was advised of the need for long term treatment and the importance of lifestyle modifications to improve her current health and to decrease her risk of future health problems.  AGREE: Multiple dietary modification options and treatment options were discussed and [name] agreed to follow the recommendations documented in the above note.  ARRANGE: [name] was educated on the importance of  frequent visits to treat obesity as outlined per CMS and USPSTF guidelines and agreed to schedule her next follow up appointment today.  Attestation Statements:   Reviewed by clinician on day of visit: allergies, medications, problem list, medical history, surgical history, family history, social history, and previous encounter notes.  Coral Ceo, am acting as transcriptionist for Coralie Common, MD.   I have reviewed the above documentation for accuracy and completeness, and I agree with the above. - Jinny Blossom, MD

## 2020-08-26 ENCOUNTER — Encounter (INDEPENDENT_AMBULATORY_CARE_PROVIDER_SITE_OTHER): Payer: Self-pay

## 2020-09-01 ENCOUNTER — Other Ambulatory Visit (INDEPENDENT_AMBULATORY_CARE_PROVIDER_SITE_OTHER): Payer: Self-pay | Admitting: Family Medicine

## 2020-09-01 DIAGNOSIS — E559 Vitamin D deficiency, unspecified: Secondary | ICD-10-CM

## 2020-09-06 ENCOUNTER — Ambulatory Visit (INDEPENDENT_AMBULATORY_CARE_PROVIDER_SITE_OTHER): Payer: Medicare Other | Admitting: Family Medicine

## 2020-09-09 ENCOUNTER — Ambulatory Visit (INDEPENDENT_AMBULATORY_CARE_PROVIDER_SITE_OTHER): Payer: Medicare Other | Admitting: Family Medicine

## 2020-09-19 ENCOUNTER — Encounter (INDEPENDENT_AMBULATORY_CARE_PROVIDER_SITE_OTHER): Payer: Self-pay

## 2020-09-20 ENCOUNTER — Ambulatory Visit (INDEPENDENT_AMBULATORY_CARE_PROVIDER_SITE_OTHER): Payer: Medicare Other | Admitting: Family Medicine

## 2020-09-27 ENCOUNTER — Ambulatory Visit (INDEPENDENT_AMBULATORY_CARE_PROVIDER_SITE_OTHER): Payer: Medicare Other | Admitting: Family Medicine

## 2020-09-27 ENCOUNTER — Encounter (INDEPENDENT_AMBULATORY_CARE_PROVIDER_SITE_OTHER): Payer: Self-pay | Admitting: Family Medicine

## 2020-10-08 ENCOUNTER — Encounter (INDEPENDENT_AMBULATORY_CARE_PROVIDER_SITE_OTHER): Payer: Self-pay | Admitting: Family Medicine

## 2020-10-13 ENCOUNTER — Ambulatory Visit (INDEPENDENT_AMBULATORY_CARE_PROVIDER_SITE_OTHER): Payer: Medicare Other | Admitting: Family Medicine

## 2021-12-21 ENCOUNTER — Encounter (INDEPENDENT_AMBULATORY_CARE_PROVIDER_SITE_OTHER): Payer: Self-pay
